# Patient Record
Sex: Male | Born: 1940
Health system: Southern US, Community
[De-identification: ages and names within clinical notes are randomized; demographics above are authoritative.]

## PROBLEM LIST (undated history)

## (undated) DIAGNOSIS — G2581 Restless legs syndrome: Secondary | ICD-10-CM

## (undated) DIAGNOSIS — Z86010 Personal history of colonic polyps: Secondary | ICD-10-CM

## (undated) DIAGNOSIS — E785 Hyperlipidemia, unspecified: Secondary | ICD-10-CM

## (undated) DIAGNOSIS — G4730 Sleep apnea, unspecified: Secondary | ICD-10-CM

## (undated) DIAGNOSIS — R739 Hyperglycemia, unspecified: Secondary | ICD-10-CM

## (undated) DIAGNOSIS — H269 Unspecified cataract: Secondary | ICD-10-CM

## (undated) DIAGNOSIS — D219 Benign neoplasm of connective and other soft tissue, unspecified: Secondary | ICD-10-CM

## (undated) DIAGNOSIS — K760 Fatty (change of) liver, not elsewhere classified: Secondary | ICD-10-CM

## (undated) DIAGNOSIS — I10 Essential (primary) hypertension: Secondary | ICD-10-CM

## (undated) DIAGNOSIS — G629 Polyneuropathy, unspecified: Secondary | ICD-10-CM

## (undated) DIAGNOSIS — J189 Pneumonia, unspecified organism: Secondary | ICD-10-CM

## (undated) DIAGNOSIS — G4733 Obstructive sleep apnea (adult) (pediatric): Secondary | ICD-10-CM

## (undated) DIAGNOSIS — K259 Gastric ulcer, unspecified as acute or chronic, without hemorrhage or perforation: Secondary | ICD-10-CM

## (undated) DIAGNOSIS — K5520 Angiodysplasia of colon without hemorrhage: Secondary | ICD-10-CM

## (undated) DIAGNOSIS — I2510 Atherosclerotic heart disease of native coronary artery without angina pectoris: Secondary | ICD-10-CM

## (undated) HISTORY — DX: Polyneuropathy, unspecified: G62.9

## (undated) HISTORY — DX: Sleep apnea, unspecified: G47.30

## (undated) HISTORY — DX: Gastric ulcer, unspecified as acute or chronic, without hemorrhage or perforation: K25.9

## (undated) HISTORY — DX: Hyperlipidemia, unspecified: E78.5

## (undated) HISTORY — DX: Benign neoplasm of connective and other soft tissue, unspecified: D21.9

## (undated) HISTORY — DX: Restless legs syndrome: G25.81

## (undated) HISTORY — DX: Unspecified cataract: H26.9

## (undated) HISTORY — DX: Personal history of colonic polyps: Z86.010

## (undated) HISTORY — DX: Hyperglycemia, unspecified: R73.9

## (undated) HISTORY — DX: Obstructive sleep apnea (adult) (pediatric): G47.33

## (undated) HISTORY — DX: Essential (primary) hypertension: I10

## (undated) HISTORY — DX: Fatty (change of) liver, not elsewhere classified: K76.0

## (undated) HISTORY — DX: Atherosclerotic heart disease of native coronary artery without angina pectoris: I25.10

## (undated) HISTORY — DX: Pneumonia, unspecified organism: J18.9

## (undated) HISTORY — DX: Angiodysplasia of colon without hemorrhage: K55.20

---

## 1995-04-29 HISTORY — PX: SKIN CANCER EXCISION: SHX779

## 2000-11-29 HISTORY — PX: ORIF ANKLE FRACTURE: SUR919

## 2003-10-27 ENCOUNTER — Encounter: Payer: Self-pay | Admitting: Family Medicine

## 2004-04-24 ENCOUNTER — Ambulatory Visit: Payer: Self-pay | Admitting: Family Medicine

## 2004-04-25 ENCOUNTER — Ambulatory Visit: Payer: Self-pay | Admitting: Family Medicine

## 2004-05-06 ENCOUNTER — Ambulatory Visit: Payer: Self-pay | Admitting: Family Medicine

## 2004-05-10 ENCOUNTER — Ambulatory Visit: Payer: Self-pay | Admitting: Pulmonary Disease

## 2004-10-24 ENCOUNTER — Ambulatory Visit: Payer: Self-pay | Admitting: Family Medicine

## 2005-02-26 ENCOUNTER — Ambulatory Visit: Payer: Self-pay | Admitting: Family Medicine

## 2005-03-28 ENCOUNTER — Encounter: Payer: Self-pay | Admitting: Family Medicine

## 2005-03-28 LAB — CONVERTED CEMR LAB: PSA: 0.7 ng/mL

## 2005-04-14 ENCOUNTER — Ambulatory Visit: Payer: Self-pay | Admitting: Family Medicine

## 2005-04-17 ENCOUNTER — Ambulatory Visit: Payer: Self-pay | Admitting: Family Medicine

## 2005-05-12 ENCOUNTER — Ambulatory Visit: Payer: Self-pay | Admitting: Family Medicine

## 2006-04-13 ENCOUNTER — Ambulatory Visit: Payer: Self-pay | Admitting: Family Medicine

## 2006-04-16 ENCOUNTER — Ambulatory Visit: Payer: Self-pay | Admitting: Family Medicine

## 2006-04-30 ENCOUNTER — Ambulatory Visit: Payer: Self-pay | Admitting: Family Medicine

## 2006-05-01 ENCOUNTER — Ambulatory Visit: Payer: Self-pay | Admitting: Family Medicine

## 2006-06-23 ENCOUNTER — Ambulatory Visit: Payer: Self-pay | Admitting: Family Medicine

## 2006-08-11 ENCOUNTER — Emergency Department: Payer: Self-pay | Admitting: Emergency Medicine

## 2006-08-21 ENCOUNTER — Emergency Department: Payer: Self-pay | Admitting: Emergency Medicine

## 2006-10-27 ENCOUNTER — Encounter: Payer: Self-pay | Admitting: Family Medicine

## 2006-10-27 DIAGNOSIS — G2581 Restless legs syndrome: Secondary | ICD-10-CM

## 2006-10-27 DIAGNOSIS — E785 Hyperlipidemia, unspecified: Secondary | ICD-10-CM

## 2006-10-27 DIAGNOSIS — G4730 Sleep apnea, unspecified: Secondary | ICD-10-CM | POA: Insufficient documentation

## 2006-10-27 DIAGNOSIS — G609 Hereditary and idiopathic neuropathy, unspecified: Secondary | ICD-10-CM | POA: Insufficient documentation

## 2006-11-02 ENCOUNTER — Ambulatory Visit: Payer: Self-pay | Admitting: Family Medicine

## 2006-11-02 DIAGNOSIS — I10 Essential (primary) hypertension: Secondary | ICD-10-CM

## 2007-04-27 ENCOUNTER — Ambulatory Visit: Payer: Self-pay | Admitting: Family Medicine

## 2007-04-27 LAB — CONVERTED CEMR LAB
ALT: 13 units/L (ref 0–53)
Albumin: 3.5 g/dL (ref 3.5–5.2)
Alkaline Phosphatase: 80 units/L (ref 39–117)
BUN: 11 mg/dL (ref 6–23)
Basophils Absolute: 0 10*3/uL (ref 0.0–0.1)
Calcium: 8.8 mg/dL (ref 8.4–10.5)
Cholesterol: 159 mg/dL (ref 0–200)
Eosinophils Absolute: 0.2 10*3/uL (ref 0.0–0.6)
GFR calc Af Amer: 78 mL/min
GFR calc non Af Amer: 65 mL/min
HDL: 23.7 mg/dL — ABNORMAL LOW (ref >39.0)
Hemoglobin: 13.9 g/dL (ref 13.0–17.0)
Lymphocytes Relative: 33.9 % (ref 12.0–46.0)
MCHC: 35.6 g/dL (ref 30.0–36.0)
Microalb Creat Ratio: 4.9 mg/g (ref 0.0–30.0)
Microalb, Ur: 0.9 mg/dL (ref 0.0–1.9)
Monocytes Absolute: 0.5 10*3/uL (ref 0.2–0.7)
Monocytes Relative: 8.9 % (ref 3.0–11.0)
Neutro Abs: 2.7 10*3/uL (ref 1.4–7.7)
PSA: 0.89 ng/mL (ref 0.10–4.00)
Platelets: 199 10*3/uL (ref 150–400)
Potassium: 3.9 meq/L (ref 3.5–5.1)
TSH: 3.42 microintl units/mL (ref 0.35–5.50)
Total Protein: 6 g/dL (ref 6.0–8.3)
Triglycerides: 112 mg/dL (ref 0–149)
VLDL: 22 mg/dL (ref 0–40)

## 2007-05-03 ENCOUNTER — Ambulatory Visit: Payer: Self-pay | Admitting: Family Medicine

## 2007-05-12 ENCOUNTER — Ambulatory Visit: Payer: Self-pay | Admitting: Family Medicine

## 2007-05-13 ENCOUNTER — Ambulatory Visit: Payer: Self-pay | Admitting: Family Medicine

## 2007-05-14 ENCOUNTER — Ambulatory Visit: Payer: Self-pay | Admitting: Family Medicine

## 2007-05-17 ENCOUNTER — Encounter (INDEPENDENT_AMBULATORY_CARE_PROVIDER_SITE_OTHER): Payer: Self-pay | Admitting: *Deleted

## 2007-05-17 ENCOUNTER — Ambulatory Visit: Payer: Self-pay | Admitting: Family Medicine

## 2007-06-02 ENCOUNTER — Ambulatory Visit: Payer: Self-pay | Admitting: Family Medicine

## 2008-01-25 ENCOUNTER — Encounter: Payer: Self-pay | Admitting: Family Medicine

## 2008-03-15 ENCOUNTER — Ambulatory Visit: Payer: Self-pay

## 2008-05-08 ENCOUNTER — Ambulatory Visit: Payer: Self-pay | Admitting: Family Medicine

## 2008-05-08 LAB — CONVERTED CEMR LAB
ALT: 22 units/L (ref 0–53)
Basophils Absolute: 0 10*3/uL (ref 0.0–0.1)
Basophils Relative: 0.4 % (ref 0.0–3.0)
CO2: 28 meq/L (ref 19–32)
Calcium: 9 mg/dL (ref 8.4–10.5)
Cholesterol: 166 mg/dL (ref 0–200)
Creatinine, Ser: 1.1 mg/dL (ref 0.4–1.5)
Creatinine,U: 142.2 mg/dL
Glucose, Bld: 104 mg/dL — ABNORMAL HIGH (ref 70–99)
HCT: 39.8 % (ref 39.0–52.0)
Hemoglobin: 13.9 g/dL (ref 13.0–17.0)
LDL Cholesterol: 115 mg/dL — ABNORMAL HIGH (ref 0–99)
Lymphocytes Relative: 31.4 % (ref 12.0–46.0)
MCHC: 35 g/dL (ref 30.0–36.0)
Microalb, Ur: 0.5 mg/dL (ref 0.0–1.9)
Monocytes Absolute: 0.4 10*3/uL (ref 0.1–1.0)
Neutro Abs: 3.1 10*3/uL (ref 1.4–7.7)
RBC: 4.4 M/uL (ref 4.22–5.81)
Total Bilirubin: 0.7 mg/dL (ref 0.3–1.2)
Total Protein: 6.2 g/dL (ref 6.0–8.3)

## 2008-05-11 ENCOUNTER — Ambulatory Visit: Payer: Self-pay | Admitting: Family Medicine

## 2008-05-29 ENCOUNTER — Ambulatory Visit: Payer: Self-pay | Admitting: Family Medicine

## 2008-05-29 ENCOUNTER — Encounter (INDEPENDENT_AMBULATORY_CARE_PROVIDER_SITE_OTHER): Payer: Self-pay | Admitting: *Deleted

## 2008-05-29 LAB — CONVERTED CEMR LAB: OCCULT 3: NEGATIVE

## 2009-01-09 ENCOUNTER — Encounter: Payer: Self-pay | Admitting: Family Medicine

## 2009-05-10 ENCOUNTER — Emergency Department: Payer: Self-pay | Admitting: Emergency Medicine

## 2009-05-10 ENCOUNTER — Telehealth: Payer: Self-pay | Admitting: Family Medicine

## 2009-05-16 ENCOUNTER — Ambulatory Visit: Payer: Self-pay | Admitting: Family Medicine

## 2009-05-17 LAB — CONVERTED CEMR LAB: Vit D, 25-Hydroxy: 41 ng/mL (ref 30–89)

## 2009-05-21 ENCOUNTER — Ambulatory Visit: Payer: Self-pay | Admitting: Family Medicine

## 2009-05-21 LAB — CONVERTED CEMR LAB
AST: 22 units/L (ref 0–37)
BUN: 19 mg/dL (ref 6–23)
Basophils Absolute: 0 10*3/uL (ref 0.0–0.1)
Calcium: 9.2 mg/dL (ref 8.4–10.5)
Cholesterol: 144 mg/dL (ref 0–200)
GFR calc non Af Amer: 64.09 mL/min (ref >60)
Glucose, Bld: 99 mg/dL (ref 70–99)
HCT: 41.1 % (ref 39.0–52.0)
HDL: 23.3 mg/dL — ABNORMAL LOW (ref >39.00)
Lymphocytes Relative: 36.9 % (ref 12.0–46.0)
Lymphs Abs: 1.9 10*3/uL (ref 0.7–4.0)
Monocytes Relative: 8.2 % (ref 3.0–12.0)
Platelets: 208 10*3/uL (ref 150.0–400.0)
RDW: 12.6 % (ref 11.5–14.6)
TSH: 2.64 microintl units/mL (ref 0.35–5.50)
Total Bilirubin: 0.8 mg/dL (ref 0.3–1.2)
Triglycerides: 177 mg/dL — ABNORMAL HIGH (ref 0.0–149.0)
VLDL: 35.4 mg/dL (ref 0.0–40.0)

## 2009-05-28 ENCOUNTER — Ambulatory Visit: Payer: Self-pay | Admitting: Family Medicine

## 2009-05-28 LAB — CONVERTED CEMR LAB
Creatinine,U: 80.1 mg/dL
Microalb, Ur: 0.2 mg/dL (ref 0.0–1.9)

## 2009-06-05 ENCOUNTER — Encounter (INDEPENDENT_AMBULATORY_CARE_PROVIDER_SITE_OTHER): Payer: Self-pay | Admitting: *Deleted

## 2009-06-05 ENCOUNTER — Ambulatory Visit: Payer: Self-pay | Admitting: Family Medicine

## 2009-06-05 LAB — CONVERTED CEMR LAB: OCCULT 2: NEGATIVE

## 2009-10-18 ENCOUNTER — Ambulatory Visit: Payer: Self-pay | Admitting: Family Medicine

## 2009-12-03 ENCOUNTER — Encounter (INDEPENDENT_AMBULATORY_CARE_PROVIDER_SITE_OTHER): Payer: Self-pay | Admitting: *Deleted

## 2010-05-23 ENCOUNTER — Ambulatory Visit
Admission: RE | Admit: 2010-05-23 | Discharge: 2010-05-23 | Payer: Self-pay | Source: Home / Self Care | Attending: Family Medicine | Admitting: Family Medicine

## 2010-05-23 ENCOUNTER — Other Ambulatory Visit: Payer: Self-pay | Admitting: Family Medicine

## 2010-05-23 LAB — LIPID PANEL
LDL Cholesterol: 103 mg/dL — ABNORMAL HIGH (ref 0–99)
Total CHOL/HDL Ratio: 6

## 2010-05-23 LAB — CBC WITH DIFFERENTIAL/PLATELET
Basophils Absolute: 0 10*3/uL (ref 0.0–0.1)
Hemoglobin: 14.3 g/dL (ref 13.0–17.0)
Lymphocytes Relative: 36.2 % (ref 12.0–46.0)
Monocytes Relative: 8.6 % (ref 3.0–12.0)
Neutro Abs: 2.6 10*3/uL (ref 1.4–7.7)
RBC: 4.53 Mil/uL (ref 4.22–5.81)
RDW: 13.2 % (ref 11.5–14.6)

## 2010-05-23 LAB — HEPATIC FUNCTION PANEL
ALT: 44 U/L (ref 0–53)
AST: 31 U/L (ref 0–37)
Bilirubin, Direct: 0.1 mg/dL (ref 0.0–0.3)
Total Bilirubin: 0.9 mg/dL (ref 0.3–1.2)

## 2010-05-23 LAB — PSA: PSA: 1.62 ng/mL (ref 0.10–4.00)

## 2010-05-23 LAB — RENAL FUNCTION PANEL
Albumin: 3.8 g/dL (ref 3.5–5.2)
BUN: 15 mg/dL (ref 6–23)
Calcium: 9.1 mg/dL (ref 8.4–10.5)
Creatinine, Ser: 1.1 mg/dL (ref 0.4–1.5)
Glucose, Bld: 92 mg/dL (ref 70–99)

## 2010-05-23 LAB — MICROALBUMIN / CREATININE URINE RATIO: Microalb Creat Ratio: 0.4 mg/g (ref 0.0–30.0)

## 2010-05-29 NOTE — Letter (Signed)
Summary: Nadara Eaton letter  [place] at [facility]  [address] [place], [place] [address]   Phone: [phone]  Fax: [phone]       12/03/2009 MRN: [id number]  NISSIM FLEISCHER [address] [place], [place]  [address]  Dear Mr. [facility] [place], and [place] announce the retirement of Arta Silence, M.D., from full-time practice at the [facility] office effective October 25, 2009 and his plans of returning part-time.  It is important to Dr. Hetty Ely and to our practice that you understand that [facility] has seven physicians in our office for your health care needs.  We will continue to offer the same exceptional care that you have today.    Dr. Hetty Ely has spoken to many of you about his plans for retirement and returning part-time in the fall.   We will continue to work with you through the transition to schedule appointments for you in the office and meet the high standards that [facility] is committed to.   Again, it is with great pleasure that we share the news that Dr. Hetty Ely will return to [facility] at [facility] in October of 2011 with a reduced schedule.    If you have any questions, or would like to request an appointment with one of our physicians, please call us at [phone] and press the option for Scheduling an appointment.  We take pleasure in providing you with excellent patient care and look forward to seeing you at your next office visit.  Our [facility] Physicians are:  Tillman Abide, M.D. Laurita Quint, M.D. Roxy Manns, M.D. Kerby Nora, M.D. Hannah Beat, M.D. Ruthe Mannan, M.D. We proudly welcomed Raechel Ache, M.D. and Eustaquio Boyden, M.D. to the practice in July/August 2011.  Sincerely,  [place] [facility]

## 2010-05-29 NOTE — Assessment & Plan Note (Signed)
Summary: COLD CONGESTION/RBH   Vital Signs:  Patient profile:   70 year old male Weight:      213 pounds Temp:     98.9 degrees F oral Pulse rate:   76 / minute Pulse rhythm:   regular BP sitting:   124 / 82  (right arm) Cuff size:   regular  Vitals Entered By: Lowella Petties CMA (October 18, 2009 12:33 PM) CC: Cough x 2 weeks   History of Present Illness: 70 yo walks in for progressive cough x 2 weeks.  Cough is dry, nagging. Has had some wheezing, no SOB. Felt feverish last night. Not taking anything OTC.  Did have a runny nose and sneezing when this started but that has all since resolved. No sore throat, nausea, vomiting or rashes.  Current Medications (verified): 1)  Gabapentin 300 Mg Caps (Gabapentin) .... Take 1 Capsule By Mouth At Bedtime 2)  Lisinopril 5 Mg  Tabs (Lisinopril) .... Take One By Mouth Daily 3)  Norvasc 5 Mg  Tabs (Amlodipine Besylate) .Marland Kitchen.. 1 Daily By Mouth 4)  Azithromycin 250 Mg  Tabs (Azithromycin) .... 2 By  Mouth Today and Then 1 Daily For 4 Days 5)  Cheratussin Ac 100-10 Mg/69ml Syrp (Guaifenesin-Codeine) .Marland Kitchen.. 1 Tsp By Mouth At Bedtime As Needed Cough  Allergies (verified): No Known Drug Allergies  Past History:  Past Surgical History: Last updated: 05/11/2008 Skin Ca Excision L Ant Neck 1997 Sleep study (11/29/00) mild-mod sleep apnea 10/02 Fractured Ankle ORIF (Dr Katrinka Blazing)  08/2002  Family History: Last updated: 19-Jun-2009 Father: Died at 31 of cancer of the liver Mother:Died at age of 14 with breast cancer Half-brother A 25  HTN Half sister dec unk reasons P aunt CABG Liver cancer- father Breast cancer- mother GM died with cancer Aunt died with cancer ETOH- father  Social History: Last updated: 10/27/2006 Marital Status: Married Children: 2 children, out of the home 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 ENT:  Denies earache, sinus pressure, and sore throat. Resp:  Complains of cough; denies shortness of breath, sputum productive, and wheezing.  Physical Exam  General:  Well-developed,well-nourished,in no acute distress; alert,appropriate and cooperative throughout examination, mildly obese. Nose:  External nasal examination shows no deformity or inflammation. Nasal mucosa are pink and moist without lesions or exudates. Mouth:  Oral mucosa and oropharynx without lesions or exudates.  Teeth in good repair. Lungs:  Normal respiratory effort, chest expands symmetrically. Lungs are clear to auscultation, no crackles or wheezes. Heart:  Normal rate and regular rhythm. S1 and S2 normal without gallop, murmur, click, rub or other extra sounds. Extremities:  No clubbing, cyanosis, edema, or deformity noted with normal full range of motion of all joints.   Psych:  Cognition and judgment appear intact. Alert and cooperative with normal attention span and concentration. No apparent delusions, illusions, hallucinations   Impression & Recommendations:  Problem # 1:  BRONCHITIS, ACUTE (ICD-466.0) Assessment New Will treat with Zpack and cheratussin as needed cough. See pt instructions for details. His updated medication list for this problem includes:    Azithromycin 250 Mg Tabs (Azithromycin) .Marland Kitchen... 2 by  mouth today and then 1 daily for 4 days    Cheratussin Ac 100-10  Mg/46ml Syrp (Guaifenesin-codeine) .Marland Kitchen... 1 tsp by mouth at bedtime as needed cough  Complete Medication List: 1)  Gabapentin 300 Mg Caps (Gabapentin) .... Take 1 capsule by mouth at bedtime 2)  Lisinopril 5 Mg Tabs (Lisinopril) .... Take one by mouth daily 3)  Norvasc 5 Mg Tabs (Amlodipine besylate) .Marland Kitchen.. 1 daily by mouth 4)  Azithromycin  250 Mg Tabs (Azithromycin) .... 2 by  mouth today and then 1 daily for 4 days 5)  Cheratussin Ac 100-10 Mg/20ml Syrp (Guaifenesin-codeine) .Marland Kitchen.. 1 tsp by mouth at bedtime as needed cough  Patient Instructions: 1)  Take Zpack as directed. 2)  Cheratussin prn cough at night (please do not drive while taking it). Call if no improvement in 5-7 days, sooner if increasing cough, fever, or new symptoms ( shortness of breath, chest pain) .  Prescriptions: CHERATUSSIN AC 100-10 MG/5ML SYRP (GUAIFENESIN-CODEINE) 1 tsp by mouth at bedtime as needed cough  #4 ounces x 0   Entered and Authorized by:   Ruthe Mannan MD   Signed by:   Ruthe Mannan MD on 10/18/2009   Method used:   Print then Give to Patient   RxID:   [id number] AZITHROMYCIN 250 MG  TABS (AZITHROMYCIN) 2 by  mouth today and then 1 daily for 4 days  #6 x 0   Entered and Authorized by:   Ruthe Mannan MD   Signed by:   Ruthe Mannan MD on 10/18/2009   Method used:   Print then Give to Patient   RxID:   [id number]
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
## 2010-05-29 NOTE — Letter (Signed)
Summary: Results Follow up Letter  [place] at [facility]  [address] [place], [place] [address]   Phone: [phone]  Fax: [phone]    06/05/2009 MRN: [id number]  SAI ZINN [address] [place], [place]  [address]  Dear Mr. Esch,  The following are the results of your recent test(s):  Test         Result    Pap Smear:        Normal _____  Not Normal _____ Comments: ______________________________________________________ Cholesterol: LDL(Bad cholesterol):         Your goal is less than:         HDL (Good cholesterol):       Your goal is more than: Comments:  ______________________________________________________ Mammogram:        Normal _____  Not Normal _____ Comments:  ___________________________________________________________________ Hemoccult:        Normal __X___  Not normal _______ Comments:  Please repeat in one year.  _____________________________________________________________________ Other Tests:    We routinely do not discuss normal results over the telephone.  If you desire a copy of the results, or you have any questions about this information we can discuss them at your next office visit.   Sincerely,     Laurita Quint, MD

## 2010-05-29 NOTE — Progress Notes (Signed)
Summary: Patient fell  Phone Note Call from Patient Call back at [phone]   Caller: Spouse Call For: [name] MD Summary of Call: Patient fell 2 days ago on the ice and is out of town and will not return until late tonight. Patient's wife states that he is real sore and bruised in his rib area. Patient told wife that he has had a cold and coughing and is trying to cough something up and can not get it up and he is nervous about it at this point. Wife stated that he is not having any difficulty breathing. Advised patient's wife that he should seek medical attention where he is at now. Wife stated that she will advise patient that he needs to go to an urgent care now. Initial call taken by: Sydell Axon LPN,  May 10, 2009 9:52 AM  Follow-up for Phone Call        That is reasonable.   Follow-up by: Hannah Beat MD,  May 10, 2009 10:01 AM

## 2010-06-12 ENCOUNTER — Encounter: Payer: Self-pay | Admitting: Family Medicine

## 2010-06-12 ENCOUNTER — Encounter (INDEPENDENT_AMBULATORY_CARE_PROVIDER_SITE_OTHER): Payer: Commercial Managed Care - PPO | Admitting: Family Medicine

## 2010-06-12 DIAGNOSIS — Z0000 Encounter for general adult medical examination without abnormal findings: Secondary | ICD-10-CM

## 2010-06-12 DIAGNOSIS — Z23 Encounter for immunization: Secondary | ICD-10-CM

## 2010-06-19 NOTE — Letter (Signed)
Summary: Nature conservation officer [employer] Wellness Visit Questionnaire   [employer] Annual Wellness Visit Questionnaire   Imported By: Beau Fanny 06/12/2010 16:46:11  _____________________________________________________________________  External Attachment:    Type:   Image     Comment:   External Document

## 2010-06-19 NOTE — Letter (Signed)
Summary: [place] [facility]: Immunoassay Fecal Occult Blood (iFOB) Order Form  [place] at [facility]  [address] [place], [place] [address]   Phone: [phone]  Fax: [phone]      [place] [facility]: Immunoassay Fecal Occult Blood (iFOB) Order Form   June 12, 2010 MRN: [id number]   Justin Ali 08-06-1940   Physicican Name:____Dr Hetty Ely   _____________________  Diagnosis Code:_____v70.0_____________________      [name] MD

## 2010-06-19 NOTE — Assessment & Plan Note (Signed)
Summary: CPX DR Hetty Ely PT/ [facility]   Vital Signs:  Patient profile:   70 year old male Weight:      212 pounds BMI:     33.83 Temp:     98.6 degrees F oral Pulse rate:   68 / minute Pulse rhythm:   regular BP sitting:   104 / 64  (left arm) Cuff size:   large  Vitals Entered By: Sydell Axon LPN (07/05/10 1:49 PM) CC: 30 Minute checkup   History of Present Illness: Pt here for COmp Exam, has no complaints and feels well.  Preventive Screening-Counseling & Management  Alcohol-Tobacco     Alcohol drinks/day: <1     Alcohol type: bourbon on wknds     Smoking Status: quit     Packs/Day: cigars     Year Quit: 6/05     Pack years: 2 cigars....quit!!     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 Take 1 Capsule By Mouth At Bedtime 2)  Lisinopril 5 Mg  Tabs (Lisinopril) .... Take One By Mouth Daily 3)  Norvasc 5 Mg  Tabs (Amlodipine Besylate) .Marland Kitchen.. 1 Daily By Mouth 4)  Cheratussin Ac 100-10 Mg/54ml Syrp (Guaifenesin-Codeine) .Marland Kitchen.. 1 Tsp By Mouth At Bedtime As Needed Cough  Current Medications (verified): 1)  Gabapentin 300 Mg Caps (Gabapentin) .... Take 1 Capsule By Mouth At Bedtime 2)  Lisinopril 5 Mg  Tabs (Lisinopril) .... Take  One By Mouth Daily 3)  Norvasc 5 Mg  Tabs (Amlodipine Besylate) .Marland Kitchen.. 1 Daily By Mouth  Allergies: No Known Drug Allergies  Past History:  Past Surgical History: Last updated: 05/11/2008 Skin Ca Excision L Ant Neck 1997 Sleep study (11/29/00) mild-mod sleep apnea 10/02 Fractured Ankle ORIF (Dr Katrinka Blazing)  08/2002  Family History: Last updated: July 05, 2010 Father: Died at 56 of cancer of the liver Mother:Died at age of 40 with breast cancer Half-brother A 64  HTN Half sister dec unk reasons P aunt CABG Liver cancer- father Breast cancer- mother GM died with cancer Aunt died with cancer ETOH- father  Social History: Last updated: 10/27/2006 Marital Status: Married Children: 2 children, out of the home 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 Eyes:  Denies blurring, discharge, and  eye pain; has early cataracts. Seen yearly with new glasses in Nov.. ENT:  Complains of ringing in ears; denies decreased hearing and earache; occas in left, much better than previously.. CV:  Denies chest pain or discomfort, fainting, fatigue, palpitations, shortness of breath with exertion, swelling of feet, and swelling of hands; occas swelling og ankle with pin in it.Marland Kitchen Resp:  Denies cough, shortness of breath, and wheezing. GI:  Denies abdominal pain, bloody stools, change in bowel habits,  constipation, dark tarry stools, diarrhea, indigestion, loss of appetite, nausea, vomiting, vomiting blood, and yellowish skin color. GU:  Denies discharge, dysuria, nocturia, and urinary frequency. MS:  Denies joint pain, low back pain, muscle aches, cramps, and stiffness. Derm:  Denies dryness, itching, and rash. Neuro:  Denies memory loss, numbness, poor balance, tingling, and tremors.  Physical Exam  General:  Well-developed,well-nourished,in no acute distress; alert,appropriate and cooperative throughout examination, mildly obese. Head:  Normocephalic and atraumatic without obvious abnormalities. No apparent alopecia or balding. Sinuses NT. Eyes:  Conjunctiva clear bilaterally.  Ears:  External ear exam shows no significant lesions or deformities.  Otoscopic examination reveals clear canals, tympanic membranes are intact bilaterally without bulging, retraction, inflammation or discharge. Hearing is grossly normal bilaterally. Nose:  External nasal examination shows no deformity or inflammation. Nasal mucosa are pink and moist without lesions or exudates. Mouth:  Oral mucosa and oropharynx without lesions or exudates.  Teeth in good repair. Neck:  No deformities, masses, or tenderness noted. Chest Wall:  No deformities, masses, tenderness or gynecomastia noted. Breasts:  No masses or gynecomastia noted Lungs:  Normal respiratory effort, chest expands symmetrically. Lungs are clear to auscultation, no crackles or wheezes. Heart:  Normal rate and regular rhythm. S1 and S2 normal without gallop, murmur, click, rub or other extra sounds. Abdomen:  Bowel sounds positive,abdomen soft and non-tender without masses, organomegaly or hernias noted. Rectal:  No external abnormalities noted. Normal sphincter tone. No rectal masses or tenderness. G neg. Genitalia:  Testes bilaterally descended without nodularity, tenderness or masses. No scrotal masses or lesions. No penis lesions or urethral  discharge. Prostate:  Prostate gland firm and smooth, no enlargement, nodularity, tenderness, mass, asymmetry or induration. 20gms. Msk:  No deformity or scoliosis noted of thoracic or lumbar spine.   Pulses:  R and L carotid,radial,femoral,dorsalis pedis and posterior tibial pulses are full and equal bilaterally Extremities:  No clubbing, cyanosis, edema, or deformity noted with normal full range of motion of all joints.   Neurologic:  No cranial nerve deficits noted. Station and gait are normal. Sensory, motor and coordinative functions appear intact. Skin:  Intact without suspicious lesions or rashes Cervical Nodes:  No lymphadenopathy noted Inguinal Nodes:  No significant adenopathy Psych:  Cognition and judgment appear intact. Alert and cooperative with normal attention span and concentration. No apparent delusions, illusions, hallucinations   Impression & Recommendations:  Problem # 1:  HEALTH MAINTENANCE EXAM (ICD-V70.0)  Reviewed preventive care protocols, scheduled due services, and updated immunizations. Pneumovax today. Stool Imunoassay to be done. Look into Zostavax. I have personally reviewed the Medicare Annual Wellness questionnaire and have noted 1.   The patient's medical and social history 2.   Their use of alcohol, tobacco or illicit drugs 3.   Their current medications and supplements 4.   The patient's functional ability including ADL's, fall risks, home safety risks and hearing or visual             impairment. 5.   Diet and physical activities 6.  Evidence for depression or mood disorders   Problem # 2:  SPECIAL SCREENING MALIGNANT NEOPLASM OF PROSTATE (ICD-V76.44) Assessment: Unchanged Stable PSA and exam.  Problem # 3:  HYPERGLYCEMIA, 105 (ICD-790.6) Assessment: Improved Euglycemic today.  Problem # 4:  HYPERTENSION, BENIGN ESSENTIAL (ICD-401.1) Assessment: Unchanged Stable. His updated medication list for this problem includes:    Lisinopril 5 Mg  Tabs (Lisinopril) .Marland Kitchen... Take one by mouth daily    Norvasc 5 Mg Tabs (Amlodipine besylate) .Marland Kitchen... 1 daily by mouth  BP today: 104/64 Prior BP: 124/82 (10/18/2009)  Labs Reviewed: K+: 4.3 (05/23/2010) Creat: : 1.1 (05/23/2010)   Chol: 158 (05/23/2010)   HDL: 24.90 (05/23/2010)   LDL: 103 (05/23/2010)   TG: 151.0 (05/23/2010)  Problem # 5:  SLEEP APNEA (ICD-780.57) Assessment: Unchanged Stable.  Problem # 6:  PERIPHERAL NEUROPATHY (ICD-356.9) Assessment: Unchanged Stable with Gabapentin at night.  Problem # 7:  HYPERCHOLESTEROLEMIA/TRIG, DECREASED HDL (ICD-272.0) Assessment: Unchanged Stable and about acceptable. Exercise would help. Labs Reviewed: SGOT: 31 (05/23/2010)   SGPT: 44 (05/23/2010)   HDL:24.90 (05/23/2010), 23.30 (05/16/2009)  LDL:103 (05/23/2010), 85 (16/01/9603)  Chol:158 (05/23/2010), 144 (05/16/2009)  Trig:151.0 (05/23/2010), 177.0 (05/16/2009)  Complete Medication List: 1)  Gabapentin 300 Mg Caps (Gabapentin) .... Take 1 capsule by mouth at bedtime 2)  Lisinopril 5 Mg Tabs (Lisinopril) .... Take one by mouth daily 3)  Norvasc 5 Mg Tabs (Amlodipine besylate) .Marland Kitchen.. 1 daily by mouth  Patient Instructions: 1)  Investigate a Zostavax. Call insurance.  2)  Pneumovax today. 3)  RTC one year, sooner as needed. Prescriptions: NORVASC 5 MG  TABS (AMLODIPINE BESYLATE) 1 daily by mouth  #30 x 12   Entered and Authorized by:   [name] MD   Signed by:   [name] MD on 06/12/2010   Method used:   Electronically to        [facility]* (retail)       [address] [place], [place]  [address]       Ph: [phone]       Fax: [phone]   RxID:   [id number] LISINOPRIL 5 MG  TABS (LISINOPRIL) Take one by mouth daily  #30 x 12   Entered and Authorized by:   [name] MD   Signed by:   [name] MD on 06/12/2010   Method used:   Electronically to        [facility]* (retail)       [address] [place],  [place]  [address]       Ph: [phone]       Fax: [phone]   RxID:   [id number] GABAPENTIN 300 MG CAPS (GABAPENTIN) Take 1 capsule by mouth at bedtime  #30 x 12   Entered and Authorized by:   [name] MD   Signed by:   [name] MD on 06/12/2010   Method used:   Electronically to        [facility]* (retail)       [address] [place], [place]  [address]       Ph: [phone]       Fax: [phone]   RxID:   [id number]    Orders Added: 1)  Est. Patient 65& > [37106]    Current Allergies (reviewed today): No known allergies    Appended Document:  CPX DR Hetty Ely PT/ [facility]   Pneumovax Vaccine    Vaccine Type: Pneumovax (Medicare)    Site: left deltoid    Mfr: Merck    Dose: 0.5 ml    Route: IM    Given by: Sydell Axon LPN    Exp. Date: 09/20/2011    Lot #: [id number]    VIS given: 04/02/09 version given June 12, 2010.

## 2010-06-20 ENCOUNTER — Other Ambulatory Visit: Payer: Self-pay | Admitting: Family Medicine

## 2010-06-20 ENCOUNTER — Encounter (INDEPENDENT_AMBULATORY_CARE_PROVIDER_SITE_OTHER): Payer: Self-pay | Admitting: *Deleted

## 2010-06-20 ENCOUNTER — Other Ambulatory Visit: Payer: Commercial Managed Care - PPO

## 2010-06-20 DIAGNOSIS — Z0000 Encounter for general adult medical examination without abnormal findings: Secondary | ICD-10-CM

## 2010-06-20 LAB — FECAL OCCULT BLOOD, IMMUNOCHEMICAL: Fecal Occult Bld: NEGATIVE

## 2010-06-25 NOTE — Letter (Signed)
Summary: Results Follow up Letter  [place] [facility]  [address] [place], [place] [address]   Phone: [phone]  Fax: [phone]    06/20/2010 MRN: [id number]    DEWARREN LEDBETTER [address] [place], [place]  [address]  [place]    Dear Mr. Esperanza,  The following are the results of your recent test(s):  Test         Result    Pap Smear:        Normal _____  Not Normal _____ Comments: ______________________________________________________ Cholesterol: LDL(Bad cholesterol):         Your goal is less than:         HDL (Good cholesterol):       Your goal is more than: Comments:  ______________________________________________________ Mammogram:        Normal _____  Not Normal _____ Comments:  ___________________________________________________________________ Hemoccult:        Normal __X___  Not normal _______ Comments:  Please repeat in one year.  _____________________________________________________________________ Other Tests:    We routinely do not discuss normal results over the telephone.  If you desire a copy of the results, or you have any questions about this information we can discuss them at your next office visit.   Sincerely,     Laurita Quint, MD

## 2010-10-21 ENCOUNTER — Other Ambulatory Visit: Payer: Self-pay | Admitting: *Deleted

## 2010-10-21 MED ORDER — GABAPENTIN 300 MG PO CAPS: 300.0000 mg | ORAL_CAPSULE | Freq: Every day | ORAL | Status: DC

## 2010-10-21 NOTE — Telephone Encounter (Signed)
Phone into pharmacy of pt's request.

## 2010-10-22 NOTE — Telephone Encounter (Signed)
Rx called to pharmacy

## 2010-12-31 ENCOUNTER — Telehealth: Payer: Self-pay | Admitting: Family Medicine

## 2011-01-01 NOTE — Telephone Encounter (Signed)
cancelled

## 2011-05-16 ENCOUNTER — Other Ambulatory Visit: Payer: Self-pay | Admitting: Family Medicine

## 2011-05-16 DIAGNOSIS — I10 Essential (primary) hypertension: Secondary | ICD-10-CM

## 2011-05-16 DIAGNOSIS — E7800 Pure hypercholesterolemia, unspecified: Secondary | ICD-10-CM

## 2011-05-16 DIAGNOSIS — Z125 Encounter for screening for malignant neoplasm of prostate: Secondary | ICD-10-CM

## 2011-05-20 ENCOUNTER — Other Ambulatory Visit (INDEPENDENT_AMBULATORY_CARE_PROVIDER_SITE_OTHER): Payer: Commercial Managed Care - PPO

## 2011-05-20 DIAGNOSIS — I10 Essential (primary) hypertension: Secondary | ICD-10-CM

## 2011-05-20 DIAGNOSIS — Z125 Encounter for screening for malignant neoplasm of prostate: Secondary | ICD-10-CM

## 2011-05-20 DIAGNOSIS — E7800 Pure hypercholesterolemia, unspecified: Secondary | ICD-10-CM

## 2011-05-20 LAB — COMPREHENSIVE METABOLIC PANEL
AST: 45 U/L — ABNORMAL HIGH (ref 0–37)
Albumin: 3.8 g/dL (ref 3.5–5.2)
Alkaline Phosphatase: 88 U/L (ref 39–117)
Glucose, Bld: 102 mg/dL — ABNORMAL HIGH (ref 70–99)
Potassium: 4.3 mEq/L (ref 3.5–5.1)
Sodium: 142 mEq/L (ref 135–145)
Total Protein: 6.2 g/dL (ref 6.0–8.3)

## 2011-05-20 LAB — LIPID PANEL
LDL Cholesterol: 118 mg/dL — ABNORMAL HIGH (ref 0–99)
VLDL: 25.6 mg/dL (ref 0.0–40.0)

## 2011-05-27 ENCOUNTER — Encounter: Payer: Commercial Managed Care - PPO | Admitting: Family Medicine

## 2011-06-02 ENCOUNTER — Ambulatory Visit (INDEPENDENT_AMBULATORY_CARE_PROVIDER_SITE_OTHER): Payer: Commercial Managed Care - PPO | Admitting: Family Medicine

## 2011-06-02 ENCOUNTER — Encounter: Payer: Self-pay | Admitting: Family Medicine

## 2011-06-02 VITALS — BP 130/80 | HR 84 | Temp 98.6°F | Ht 66.0 in | Wt 215.8 lb

## 2011-06-02 DIAGNOSIS — M25569 Pain in unspecified knee: Secondary | ICD-10-CM

## 2011-06-02 DIAGNOSIS — M25562 Pain in left knee: Secondary | ICD-10-CM

## 2011-06-02 DIAGNOSIS — Z1211 Encounter for screening for malignant neoplasm of colon: Secondary | ICD-10-CM

## 2011-06-02 DIAGNOSIS — Z0000 Encounter for general adult medical examination without abnormal findings: Secondary | ICD-10-CM | POA: Insufficient documentation

## 2011-06-02 DIAGNOSIS — K760 Fatty (change of) liver, not elsewhere classified: Secondary | ICD-10-CM | POA: Insufficient documentation

## 2011-06-02 NOTE — Patient Instructions (Addendum)
Return in 3 months for recheck blood work (liver).  If staying elevated, we will check ultrasound.  In meantime avoid tylenol and back off some on EtOH. For knee - I think you have patellar tendon inflammation.  Take aleve daily to twice daily for next 5 days with food then as needed for pain.  May also ice knee.  Stretching exercises provided - also do lateral straight leg raises. Let me know if knee not improving. iFOB (stool kit) today Call us with questions, return in 1 year for repeat physical, in 3 months for repeat blood work

## 2011-06-02 NOTE — Assessment & Plan Note (Addendum)
New.  Denies increased tylenol use or excessive EtOH use (only 3-4 drinks on weekend with dinner - bourbon). rec rtc 3 mo for recheck, back off EtOH. will check ferritin and hep panel then. If staying elevated, check Abd [place].

## 2011-06-02 NOTE — Assessment & Plan Note (Addendum)
Preventative protocols reviewed and updated. Sent home with iFOB. PSA, DRE reassuring. Discussed healthy living, reviewed blood work in detail.

## 2011-06-02 NOTE — Assessment & Plan Note (Signed)
Anticipate PFPS, provided with [facility] pt advisor handout on this as well as rec use aleve and ice. If not improving, let me know.

## 2011-06-02 NOTE — Progress Notes (Signed)
Subjective:    Patient ID: Justin Ali, male    DOB: February 04, 1941, 71 y.o.   MRN: [id number]  HPI CC: CPE  Prior saw Dr. Hetty Ely.  Presents to transfer care and for CPE.  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  No pain with walking or anything else.  No pain with stairs, hills, etc.  Started 3-4 mo ago.  Worse with bending knee.  Denies inciting injury.  Would like medicine for pain.  Has not tried OTC meds but has aleve at home.  Preventative: Last blood work, physical Colonoscopy: never had.  Always normal stool kits.  Wants to continue this. Prostate - gets checked yearly.  No nocturia, no weakness of stream. Flu shot - 01/2011 Td - 2012 at [facility] Pneumonia completed 2012 zostavax - insurance won't cover so not interested.  No falls in last year. No anhedonia.  Denies depressed.  Medications and allergies reviewed and updated in chart.  Past histories reviewed and updated if relevant as below. Patient Active Problem List  Diagnoses  . HYPERCHOLESTEROLEMIA/TRIG, DECREASED HDL  . RESTLESS LEG SYNDROME  . PERIPHERAL NEUROPATHY  . HYPERTENSION, BENIGN ESSENTIAL  . SLEEP APNEA   Past Medical History  Diagnosis Date  . Hyperglycemia   . HTN (hypertension)   . Other benign neoplasm of connective and other soft tissue of unspecified site     Neurofibroma of lateral periorbital area  . Peripheral neuropathy   . Restless legs syndrome (RLS)   . Unspecified sleep apnea   . HLD (hyperlipidemia)    Past Surgical History  Procedure Date  . Skin cancer excision 1997    left anterior neck  . Orif ankle fracture 11/29/00    Dr. Katrinka Blazing   History  Substance Use Topics  . Smoking status: Former Smoker    Types: Cigars  . Smokeless tobacco: Not on file  . Alcohol Use: Yes     Weekends   Family History  Problem Relation Age of Onset  . Liver cancer Father   . Breast cancer Mother   . Hypertension Brother     Half-brother  .  Coronary artery disease Paternal Aunt     CABG  . Cancer      GM; Aunt  . Alcohol abuse Father    No Known Allergies Current Outpatient Prescriptions on File Prior to Visit  Medication Sig Dispense Refill  . gabapentin (NEURONTIN) 300 MG capsule Take 1 capsule (300 mg total) by mouth at bedtime.  30 capsule  12     Review of Systems  Constitutional: Negative for fever, chills, activity change, appetite change, fatigue and unexpected weight change.  HENT: Negative for hearing loss and neck pain.   Eyes: Negative for visual disturbance.  Respiratory: Negative for cough, chest tightness, shortness of breath and wheezing.   Cardiovascular: Negative for chest pain, palpitations and leg swelling.  Gastrointestinal: Negative for nausea, vomiting, abdominal pain, diarrhea, constipation, blood in stool and abdominal distention.  Genitourinary: Negative for hematuria and difficulty urinating.  Musculoskeletal: Negative for myalgias and arthralgias.  Skin: Negative for rash.  Neurological: Negative for dizziness, seizures, syncope and headaches.  Hematological: Does not bruise/bleed easily.  Psychiatric/Behavioral: Negative for dysphoric mood. The patient is not nervous/anxious.        Objective:   Physical Exam  Nursing note and vitals reviewed. Constitutional: He is oriented to person, place, and time. He appears well-developed and well-nourished. No distress.  HENT:  Head:  Normocephalic and atraumatic.  Right Ear: External ear normal.  Left Ear: External ear normal.  Nose: Nose normal.  Mouth/Throat: Oropharynx is clear and moist. No oropharyngeal exudate.  Eyes: Conjunctivae and EOM are normal. Pupils are equal, round, and reactive to light. No scleral icterus.  Neck: Normal range of motion. Neck supple. No thyromegaly present.  Cardiovascular: Normal rate, regular rhythm, normal heart sounds and intact distal pulses.   No murmur heard. Pulses:      Radial pulses are 2+ on the  right side, and 2+ on the left side.  Pulmonary/Chest: Effort normal and breath sounds normal. No respiratory distress. He has no wheezes. He has no rales.  Abdominal: Soft. Bowel sounds are normal. He exhibits no distension and no mass. There is no tenderness. There is no rebound and no guarding.  Genitourinary: Rectum normal and prostate normal. Rectal exam shows no external hemorrhoid, no internal hemorrhoid, no fissure, no mass, no tenderness and anal tone normal. Guaiac negative stool. Prostate is not enlarged and not tender.  Musculoskeletal: Normal range of motion. He exhibits edema (tr pedal edema).       Right knee: Normal.       Left knee: He exhibits normal range of motion, no swelling and no effusion. tenderness found.       FROM flexion/extension. Neg Mcmurray's bilaterally Neg drawer sign bilaterally + PFgrind on left side.  Tender to palpation patellar tendon and superior medial edge of patella No fullness popliteal region  Lymphadenopathy:    He has no cervical adenopathy.  Neurological: He is alert and oriented to person, place, and time.       CN grossly intact, station and gait intact  Skin: Skin is warm and dry. No rash noted.  Psychiatric: He has a normal mood and affect. His behavior is normal. Judgment and thought content normal.      Assessment & Plan:

## 2011-08-27 DIAGNOSIS — K760 Fatty (change of) liver, not elsewhere classified: Secondary | ICD-10-CM

## 2011-08-27 HISTORY — DX: Fatty (change of) liver, not elsewhere classified: K76.0

## 2011-08-28 ENCOUNTER — Other Ambulatory Visit: Payer: Self-pay | Admitting: Family Medicine

## 2011-09-01 ENCOUNTER — Other Ambulatory Visit (INDEPENDENT_AMBULATORY_CARE_PROVIDER_SITE_OTHER): Payer: Commercial Managed Care - PPO

## 2011-09-01 LAB — HEPATIC FUNCTION PANEL
ALT: 80 U/L — ABNORMAL HIGH (ref 0–53)
Total Protein: 6.8 g/dL (ref 6.0–8.3)

## 2011-09-02 ENCOUNTER — Other Ambulatory Visit: Payer: Self-pay | Admitting: Family Medicine

## 2011-09-02 LAB — HEPATITIS PANEL, ACUTE
Hep B C IgM: NEGATIVE
Hepatitis B Surface Ag: NEGATIVE

## 2011-09-02 MED ORDER — OMEGA-3 FATTY ACIDS 1000 MG PO CAPS: 1.0000 g | ORAL_CAPSULE | Freq: Every day | ORAL | Status: DC

## 2011-09-16 ENCOUNTER — Encounter: Payer: Self-pay | Admitting: Family Medicine

## 2011-09-16 ENCOUNTER — Ambulatory Visit: Payer: Self-pay | Admitting: Family Medicine

## 2011-09-17 ENCOUNTER — Encounter: Payer: Self-pay | Admitting: Family Medicine

## 2011-11-06 ENCOUNTER — Other Ambulatory Visit: Payer: Self-pay | Admitting: *Deleted

## 2011-11-06 MED ORDER — AMLODIPINE BESYLATE 5 MG PO TABS: 5.0000 mg | ORAL_TABLET | Freq: Every day | ORAL | Status: DC

## 2011-11-06 NOTE — Telephone Encounter (Signed)
Received failed escript for amlodipine.  Please call in to ensure received. Thanks.

## 2011-11-06 NOTE — Addendum Note (Signed)
Addended by: Eustaquio Boyden on: 11/06/2011 01:29 PM   Modules accepted: Orders

## 2011-11-07 ENCOUNTER — Other Ambulatory Visit: Payer: Self-pay | Admitting: *Deleted

## 2011-11-07 MED ORDER — LISINOPRIL 5 MG PO TABS: 5.0000 mg | ORAL_TABLET | Freq: Every day | ORAL | Status: DC

## 2011-11-07 NOTE — Telephone Encounter (Signed)
Verified with pharmacy .  

## 2011-12-08 ENCOUNTER — Other Ambulatory Visit: Payer: Self-pay | Admitting: *Deleted

## 2011-12-08 MED ORDER — AMLODIPINE BESYLATE 5 MG PO TABS: 5.0000 mg | ORAL_TABLET | Freq: Every day | ORAL | Status: DC

## 2012-01-16 ENCOUNTER — Telehealth: Payer: Self-pay | Admitting: *Deleted

## 2012-01-16 MED ORDER — GABAPENTIN 300 MG PO CAPS: 300.0000 mg | ORAL_CAPSULE | Freq: Every day | ORAL | Status: DC

## 2012-01-16 NOTE — Telephone Encounter (Signed)
Sent in

## 2012-01-16 NOTE — Telephone Encounter (Signed)
Ok to refill Gabapentin 300 mg 1 PO QHS? Not on current med list.

## 2012-03-10 ENCOUNTER — Other Ambulatory Visit: Payer: Self-pay | Admitting: Family Medicine

## 2012-03-17 ENCOUNTER — Other Ambulatory Visit (INDEPENDENT_AMBULATORY_CARE_PROVIDER_SITE_OTHER): Payer: Commercial Managed Care - PPO

## 2012-03-17 LAB — COMPREHENSIVE METABOLIC PANEL
AST: 36 U/L (ref 0–37)
Alkaline Phosphatase: 99 U/L (ref 39–117)
BUN: 13 mg/dL (ref 6–23)
Creatinine, Ser: 1.2 mg/dL (ref 0.4–1.5)

## 2012-04-28 DIAGNOSIS — I2510 Atherosclerotic heart disease of native coronary artery without angina pectoris: Secondary | ICD-10-CM

## 2012-04-28 HISTORY — DX: Atherosclerotic heart disease of native coronary artery without angina pectoris: I25.10

## 2012-05-22 ENCOUNTER — Other Ambulatory Visit: Payer: Self-pay | Admitting: Family Medicine

## 2012-05-22 DIAGNOSIS — Z125 Encounter for screening for malignant neoplasm of prostate: Secondary | ICD-10-CM

## 2012-05-22 DIAGNOSIS — I10 Essential (primary) hypertension: Secondary | ICD-10-CM

## 2012-05-22 DIAGNOSIS — E7800 Pure hypercholesterolemia, unspecified: Secondary | ICD-10-CM

## 2012-05-24 ENCOUNTER — Other Ambulatory Visit (INDEPENDENT_AMBULATORY_CARE_PROVIDER_SITE_OTHER): Payer: Commercial Managed Care - PPO

## 2012-05-24 DIAGNOSIS — E7800 Pure hypercholesterolemia, unspecified: Secondary | ICD-10-CM

## 2012-05-24 DIAGNOSIS — I10 Essential (primary) hypertension: Secondary | ICD-10-CM

## 2012-05-24 DIAGNOSIS — Z125 Encounter for screening for malignant neoplasm of prostate: Secondary | ICD-10-CM

## 2012-05-24 LAB — COMPREHENSIVE METABOLIC PANEL
AST: 34 U/L (ref 0–37)
Albumin: 3.9 g/dL (ref 3.5–5.2)
BUN: 13 mg/dL (ref 6–23)
Calcium: 9.4 mg/dL (ref 8.4–10.5)
Chloride: 106 mEq/L (ref 96–112)
Glucose, Bld: 100 mg/dL — ABNORMAL HIGH (ref 70–99)
Potassium: 4.1 mEq/L (ref 3.5–5.1)

## 2012-05-24 LAB — LIPID PANEL
Cholesterol: 165 mg/dL (ref 0–200)
Total CHOL/HDL Ratio: 6
Triglycerides: 195 mg/dL — ABNORMAL HIGH (ref 0.0–149.0)

## 2012-05-25 ENCOUNTER — Other Ambulatory Visit: Payer: Commercial Managed Care - PPO

## 2012-06-01 ENCOUNTER — Ambulatory Visit (INDEPENDENT_AMBULATORY_CARE_PROVIDER_SITE_OTHER): Payer: Commercial Managed Care - PPO | Admitting: Family Medicine

## 2012-06-01 ENCOUNTER — Encounter: Payer: Self-pay | Admitting: Family Medicine

## 2012-06-01 VITALS — BP 118/66 | HR 70 | Temp 98.1°F | Ht 67.0 in | Wt 216.0 lb

## 2012-06-01 DIAGNOSIS — G609 Hereditary and idiopathic neuropathy, unspecified: Secondary | ICD-10-CM | POA: Diagnosis not present

## 2012-06-01 DIAGNOSIS — G2581 Restless legs syndrome: Secondary | ICD-10-CM

## 2012-06-01 DIAGNOSIS — Z1211 Encounter for screening for malignant neoplasm of colon: Secondary | ICD-10-CM

## 2012-06-01 DIAGNOSIS — I10 Essential (primary) hypertension: Secondary | ICD-10-CM

## 2012-06-01 DIAGNOSIS — E7800 Pure hypercholesterolemia, unspecified: Secondary | ICD-10-CM | POA: Diagnosis not present

## 2012-06-01 DIAGNOSIS — G4730 Sleep apnea, unspecified: Secondary | ICD-10-CM

## 2012-06-01 DIAGNOSIS — Z0000 Encounter for general adult medical examination without abnormal findings: Secondary | ICD-10-CM

## 2012-06-01 MED ORDER — AMLODIPINE BESYLATE 5 MG PO TABS: 5.0000 mg | ORAL_TABLET | Freq: Every day | ORAL | Status: DC

## 2012-06-01 MED ORDER — LISINOPRIL 5 MG PO TABS: 5.0000 mg | ORAL_TABLET | Freq: Every day | ORAL | Status: DC

## 2012-06-01 MED ORDER — GABAPENTIN 300 MG PO CAPS: 300.0000 mg | ORAL_CAPSULE | Freq: Every day | ORAL | Status: DC

## 2012-06-01 NOTE — Progress Notes (Signed)
Subjective:    Patient ID: Justin Ali, male    DOB: 06/11/1940, 72 y.o.   MRN: [id number]  HPI CC: welcome to medicare visit  Takes melatonin for sleep initiation insomnia. Compliant with BP and RLS meds.  Caffeine: occasional coffee Lives with wife, 1 cat Occupation: truck Paediatric nurse) Activity: no regular exercise Diet: rare water, some fruits/vegetables  Preventative:  Last blood work, physical  Colonoscopy: never had. Always normal stool kits. Wants to continue this. States mailed in last CPE but no records of this. Prostate - gets checked yearly. No nocturia, no weakness of stream.  No known fmhx prostate cancer Flu shot - 01/2012 Td - 2012 at [facility]  Pneumonia completed 2012  zostavax - insurance won't cover so not interested.  Advanced directives - has at home.  Wife would be HCPOA.  No desire for prolonged life support  No falls in last year.  No anhedonia. Denies depressed.  Hearing and vision checked regularly for DOT, last done 01/2012.  Declines screen today.  Medications and allergies reviewed and updated in chart.  Past histories reviewed and updated if relevant as below. Patient Active Problem List  Diagnosis  . HYPERCHOLESTEROLEMIA/TRIG, DECREASED HDL  . RESTLESS LEG SYNDROME  . PERIPHERAL NEUROPATHY  . HYPERTENSION, BENIGN ESSENTIAL  . SLEEP APNEA  . Health maintenance examination  . Left knee pain  . Transaminitis   Past Medical History  Diagnosis Date  . Hyperglycemia   . HTN (hypertension)   . Other benign neoplasm of connective and other soft tissue of unspecified site     Neurofibroma of lateral periorbital area  . Peripheral neuropathy   . Restless legs syndrome (RLS)   . OSA (obstructive sleep apnea)     did not tolerate CPAP  . HLD (hyperlipidemia)   . NAFLD (nonalcoholic fatty liver disease) 07/979    by abd [place] with increased LFTs   Past Surgical History  Procedure Date  . Skin cancer excision 1997    left anterior neck   . Orif ankle fracture 11/29/00    Dr. Katrinka Blazing   History  Substance Use Topics  . Smoking status: Former Smoker    Types: Cigars  . Smokeless tobacco: Never Used     Comment: rare cigar  . Alcohol Use: Yes     Comment: Weekends   Family History  Problem Relation Age of Onset  . Cancer Father     liver  . Cancer Mother     breast  . Hypertension Brother     Half-brother  . Coronary artery disease Paternal Aunt     CABG  . Cancer Other     GM; Aunt - breast  . Alcohol abuse Father    No Known Allergies Current Outpatient Prescriptions on File Prior to Visit  Medication Sig Dispense Refill  . amLODipine (NORVASC) 5 MG tablet Take 1 tablet (5 mg total) by mouth daily.  30 tablet  11  . cholecalciferol (VITAMIN D) 1000 UNITS tablet Take 1,000 Units by mouth daily.      Marland Kitchen gabapentin (NEURONTIN) 300 MG capsule Take 1 capsule (300 mg total) by mouth at bedtime.  30 capsule  11  . lisinopril (PRINIVIL,ZESTRIL) 5 MG tablet Take 1 tablet (5 mg total) by mouth daily.  30 tablet  11  . Multiple Vitamins-Minerals (MULTIVITAMIN PO) Take 1 tablet by mouth daily.         Review of Systems  Constitutional: Negative for fever, chills, activity change, appetite change, fatigue  and unexpected weight change.  HENT: Negative for hearing loss and neck pain.   Eyes: Negative for visual disturbance.  Respiratory: Negative for cough, chest tightness, shortness of breath and wheezing.   Cardiovascular: Negative for chest pain, palpitations and leg swelling.  Gastrointestinal: Negative for nausea, vomiting, abdominal pain, diarrhea, constipation, blood in stool and abdominal distention.  Genitourinary: Negative for hematuria and difficulty urinating.  Musculoskeletal: Negative for myalgias and arthralgias.  Skin: Negative for rash.  Neurological: Negative for dizziness, seizures, syncope and headaches.  Hematological: Does not bruise/bleed easily.  Psychiatric/Behavioral: Negative for dysphoric mood.  The patient is not nervous/anxious.        Objective:   Physical Exam  Nursing note and vitals reviewed. Constitutional: He is oriented to person, place, and time. He appears well-developed and well-nourished. No distress.  HENT:  Head: Normocephalic and atraumatic.  Right Ear: Hearing, tympanic membrane, external ear and ear canal normal.  Left Ear: Hearing, tympanic membrane, external ear and ear canal normal.  Nose: Nose normal.  Mouth/Throat: Oropharynx is clear and moist. No oropharyngeal exudate.  Eyes: Conjunctivae normal and EOM are normal. Pupils are equal, round, and reactive to light. No scleral icterus.  Neck: Normal range of motion. Neck supple. Carotid bruit is not present.  Cardiovascular: Normal rate, regular rhythm, normal heart sounds and intact distal pulses.   No murmur heard. Pulses:      Radial pulses are 2+ on the right side, and 2+ on the left side.  Pulmonary/Chest: Effort normal and breath sounds normal. No respiratory distress. He has no wheezes. He has no rales.  Abdominal: Soft. Bowel sounds are normal. He exhibits no distension and no mass. There is no tenderness. There is no rebound and no guarding.  Genitourinary: Rectum normal and prostate normal. Rectal exam shows no external hemorrhoid, no internal hemorrhoid, no fissure, no mass, no tenderness and anal tone normal. Guaiac negative stool. Prostate is not enlarged (20gm) and not tender.  Musculoskeletal: Normal range of motion. He exhibits no edema.  Lymphadenopathy:    He has no cervical adenopathy.  Neurological: He is alert and oriented to person, place, and time.       CN grossly intact, station and gait intact  Skin: Skin is warm and dry. No rash noted.  Psychiatric: He has a normal mood and affect. His behavior is normal. Judgment and thought content normal.       Assessment & Plan:

## 2012-06-01 NOTE — Patient Instructions (Addendum)
EKG today. Fill out medicare form. Stool kit today. meds refilled today. Good to see you today, call us with questions. Return in 1 year or as needed.

## 2012-06-02 DIAGNOSIS — Z0000 Encounter for general adult medical examination without abnormal findings: Secondary | ICD-10-CM | POA: Insufficient documentation

## 2012-06-02 NOTE — Assessment & Plan Note (Signed)
reviewed #s, discussed increased aerobic exercise to raise HDL Discussed triglycerides and need to increase fish or fish oil.

## 2012-06-02 NOTE — Assessment & Plan Note (Addendum)
Resolved transaminitis.  Fatty liver on [place].

## 2012-06-02 NOTE — Assessment & Plan Note (Addendum)
Welcome to medicare visit today. EKG - sinus arrhythmia rate 50-60s, normal axis, intervals, no hypertrophy or acute ST/T changes  I have personally reviewed the Medicare Annual Wellness questionnaire and have noted 1. The patient's medical and social history 2. Their use of alcohol, tobacco or illicit drugs 3. Their current medications and supplements 4. The patient's functional ability including ADL's, fall risks, home safety risks and hearing or visual impairment. 5. Diet and physical activity 6. Evidence for depression or mood disorders The patients weight, height, BMI have been recorded in the chart.  Hearing and vision has been addressed. I have made referrals, counseling and provided education to the patient based review of the above and I have provided the pt with a written personalized care plan for preventive services. See scanned questionairre. Advanced directives discussed: has at home, wants wife to be HCPOA  Reviewed preventative protocols and updated unless pt declined. UTD immunizations, not interested in zostavax. UTD colonoscopy. DRE/PSA reassuring today.  Discussed stopping screening.

## 2012-06-02 NOTE — Assessment & Plan Note (Signed)
States intolerant to CPAP. discussed importance of weight loss

## 2012-06-02 NOTE — Assessment & Plan Note (Signed)
Continue gabapentin.

## 2012-06-02 NOTE — Assessment & Plan Note (Signed)
Chronic, stable. Continue med regimen. 

## 2012-06-02 NOTE — Assessment & Plan Note (Signed)
-  continue gabapentin

## 2012-06-08 ENCOUNTER — Other Ambulatory Visit: Payer: Commercial Managed Care - PPO

## 2012-06-08 DIAGNOSIS — Z1211 Encounter for screening for malignant neoplasm of colon: Secondary | ICD-10-CM

## 2012-06-13 ENCOUNTER — Other Ambulatory Visit: Payer: Self-pay | Admitting: Family Medicine

## 2012-06-13 DIAGNOSIS — R195 Other fecal abnormalities: Secondary | ICD-10-CM

## 2012-06-14 ENCOUNTER — Encounter: Payer: Self-pay | Admitting: Internal Medicine

## 2012-07-26 ENCOUNTER — Ambulatory Visit (AMBULATORY_SURGERY_CENTER): Payer: Commercial Managed Care - PPO | Admitting: *Deleted

## 2012-07-26 VITALS — Ht 67.0 in | Wt 214.0 lb

## 2012-07-26 DIAGNOSIS — Z1211 Encounter for screening for malignant neoplasm of colon: Secondary | ICD-10-CM

## 2012-07-26 MED ORDER — NA SULFATE-K SULFATE-MG SULF 17.5-3.13-1.6 GM/177ML PO SOLN: ORAL | Status: DC

## 2012-07-27 HISTORY — PX: COLONOSCOPY: SHX174

## 2012-07-28 ENCOUNTER — Encounter: Payer: Self-pay | Admitting: Internal Medicine

## 2012-08-02 ENCOUNTER — Ambulatory Visit (AMBULATORY_SURGERY_CENTER): Payer: Commercial Managed Care - PPO | Admitting: Internal Medicine

## 2012-08-02 ENCOUNTER — Encounter: Payer: Self-pay | Admitting: Internal Medicine

## 2012-08-02 ENCOUNTER — Telehealth: Payer: Self-pay | Admitting: *Deleted

## 2012-08-02 VITALS — BP 100/64 | HR 51 | Temp 97.1°F | Resp 15 | Ht 67.0 in | Wt 214.0 lb

## 2012-08-02 DIAGNOSIS — Z1211 Encounter for screening for malignant neoplasm of colon: Secondary | ICD-10-CM | POA: Diagnosis not present

## 2012-08-02 DIAGNOSIS — D126 Benign neoplasm of colon, unspecified: Secondary | ICD-10-CM | POA: Diagnosis not present

## 2012-08-02 MED ORDER — SODIUM CHLORIDE 0.9 % IV SOLN: 500.0000 mL | INTRAVENOUS | Status: DC

## 2012-08-02 NOTE — Progress Notes (Addendum)
Patient did not have preoperative order for IV antibiotic SSI prophylaxis. (G8918)  Patient did not experience any of the following events: a burn prior to discharge; a fall within the facility; wrong site/side/patient/procedure/implant event; or a hospital transfer or hospital admission upon discharge from the facility. (G8907)  

## 2012-08-02 NOTE — Patient Instructions (Addendum)
I found and removed 5 polyps. They all appear benign but some were large and will probably need close follow-up. One of the sites was oozing blood so I placed 2 disposable metal clips to reduce chance of bleeding later. You should avoid MRI scanners for 1 month (May7).  Do not take aspirin or other anti-inflammatory medications/blood thinners for 2 weeks (until April 22).  I will let you know pathology results and when to have another routine colonoscopy by mail.  Thank you for choosing me and [facility].  Iva Boop, MD, FACG   YOU HAD AN ENDOSCOPIC PROCEDURE TODAY AT THE  ENDOSCOPY CENTER: Refer to the procedure report that was given to you for any specific questions about what was found during the examination.  If the procedure report does not answer your questions, please call your gastroenterologist to clarify.  If you requested that your care partner not be given the details of your procedure findings, then the procedure report has been included in a sealed envelope for you to review at your convenience later.  YOU SHOULD EXPECT: Some feelings of bloating in the abdomen. Passage of more gas than usual.  Walking can help get rid of the air that was put into your GI tract during the procedure and reduce the bloating. If you had a lower endoscopy (such as a colonoscopy or flexible sigmoidoscopy) you may notice spotting of blood in your stool or on the toilet paper. If you underwent a bowel prep for your procedure, then you may not have a normal bowel movement for a few days.  DIET: Your first meal following the procedure should be a light meal and then it is ok to progress to your normal diet.  A half-sandwich or bowl of soup is an example of a good first meal.  Heavy or fried foods are harder to digest and may make you feel nauseous or bloated.  Likewise meals heavy in dairy and vegetables can cause extra gas to form and this can also increase the bloating.  Drink plenty  of fluids but you should avoid alcoholic beverages for 24 hours.  ACTIVITY: Your care partner should take you home directly after the procedure.  You should plan to take it easy, moving slowly for the rest of the day.  You can resume normal activity the day after the procedure however you should NOT DRIVE or use heavy machinery for 24 hours (because of the sedation medicines used during the test).    SYMPTOMS TO REPORT IMMEDIATELY: A gastroenterologist can be reached at any hour.  During normal business hours, 8:30 AM to 5:00 PM Monday through Friday, call [phone].  After hours and on weekends, please call the GI answering service at [phone] who will take a message and have the physician on call contact you.   Following lower endoscopy (colonoscopy or flexible sigmoidoscopy):  Excessive amounts of blood in the stool  Significant tenderness or worsening of abdominal pains  Swelling of the abdomen that is new, acute  Fever of 100F or higher   FOLLOW UP: If any biopsies were taken you will be contacted by phone or by letter within the next 1-3 weeks.  Call your gastroenterologist if you have not heard about the biopsies in 3 weeks.  Our staff will call the home number listed on your records the next business day following your procedure to check on you and address any questions or concerns that you may have at that time regarding the  information given to you following your procedure. This is a courtesy call and so if there is no answer at the home number and we have not heard from you through the emergency physician on call, we will assume that you have returned to your regular daily activities without incident.  SIGNATURES/CONFIDENTIALITY: You and/or your care partner have signed paperwork which will be entered into your electronic medical record.  These signatures attest to the fact that that the information above on your After Visit Summary has been reviewed and is understood.   Full responsibility of the confidentiality of this discharge information lies with you and/or your care-partner.

## 2012-08-02 NOTE — Telephone Encounter (Signed)
Pt's wife dropped off EKG from Dr. Brennan Bailey for Dr. Sharen Hones to evaluate.    Best number for Justin Ali [phone]

## 2012-08-02 NOTE — Op Note (Signed)
[facility] [address] [place], [address]   COLONOSCOPY PROCEDURE REPORT  PATIENT: Justin Ali, Justin Ali  MR#: [id number] BIRTHDATE: 04-09-1941 , 71  yrs. old GENDER: Male ENDOSCOPIST: Iva Boop, MD, [facility] REFERRED NW:GNFAOZ Sharen Hones, M.D. PROCEDURE DATE:  08/02/2012 PROCEDURE:   Colonoscopy with snare polypectomy and Submucosal injection, any substance ASA CLASS:   Class II INDICATIONS:average risk screening. MEDICATIONS: propofol (Diprivan) 400mg  IV, MAC sedation, administered by CRNA, and These medications were titrated to patient response per physician's verbal order  DESCRIPTION OF PROCEDURE:   After the risks benefits and alternatives of the procedure were thoroughly explained, informed consent was obtained.  A digital rectal exam revealed no abnormalities of the rectum, A digital rectal exam revealed no prostatic nodules, and A digital rectal exam revealed the prostate was not enlarged.   The LB [id number]  endoscope was introduced through the anus and advanced to the cecum, which was identified by both the appendix and ileocecal valve. No adverse events experienced.   The quality of the prep was Suprep excellent The instrument was then slowly withdrawn as the colon was fully examined.     COLON FINDINGS: Five polypoid shaped sessile polyps measuring 5-20 mm in size were found at the cecum(51mm hot snare) , in the ascending colon (1 cm hot snare and 2 cm piecemeal hot snare with 2 clips place to reduce risk of bleeding as there was a persistent ooze (controlled), and submucosal SPOT injection also to mark area) at the hepatic flexure (8 mm cold snare), and in the transverse colon (5mm cold snare).   The resections were complete and the polyp tissue was completely retrieved.   The colon mucosa was otherwise normal.  Retroflexed views revealed no abnormalities. The time to cecum=1 minutes 12 seconds.  Withdrawal time=31 minutes  20 seconds.  The scope was withdrawn and the procedure completed. COMPLICATIONS: There were no complications.  ENDOSCOPIC IMPRESSION: 1.   Five sessile polyps measuring 5-20 mm in size were found at the cecum, in the ascending colon, at the hepatic flexure, and in the transverse colon; polypectomy was performed using snare cautery and with a cold snare and ascending site marked with SPOT. 2.   The colon mucosa was otherwise normal - excellent prep  RECOMMENDATIONS: 1.  Hold aspirin, aspirin products, and anti-inflammatory medication for 2 weeks. 2.  Timing of repeat colonoscopy will be determined by pathology findings. 3.   Likely within 1 year eSigned:  Iva Boop, MD, [facility] 08/02/2012 12:53 PM   cc: Eustaquio Boyden MD and The Patient

## 2012-08-02 NOTE — Progress Notes (Signed)
Called to room to assist during endoscopic procedure.  Patient ID and intended procedure confirmed with present staff. Received instructions for my participation in the procedure from the performing physician.  

## 2012-08-03 ENCOUNTER — Telehealth: Payer: Self-pay | Admitting: *Deleted

## 2012-08-03 NOTE — Telephone Encounter (Signed)
In your IN box for review.  

## 2012-08-03 NOTE — Telephone Encounter (Signed)
  Follow up Call-  Call back number 08/02/2012  Post procedure Call Back phone  # [phone]  Permission to leave phone message Yes     Patient questions:  Do you have a fever, pain , or abdominal swelling? no Pain Score  0 *  Have you tolerated food without any problems? yes  Have you been able to return to your normal activities? yes  Do you have any questions about your discharge instructions: Diet   no Medications  no Follow up visit  no  Do you have questions or concerns about your Care? no  Actions: * If pain score is 4 or above: No action needed, pain <4.

## 2012-08-04 NOTE — Telephone Encounter (Signed)
Patient notified and said he has been feeling just fine. No chest pain,SOB, palpitations or anything at rest or with exertion.

## 2012-08-04 NOTE — Telephone Encounter (Addendum)
plz notify I reviewed lead strip.  Overall looking ok (equal PR intervals throughout).  Likely sinus arrhythmia No need to f/u with me unless pt experiencing palpitations or chest pain or other concerning sxs.

## 2012-08-05 ENCOUNTER — Encounter: Payer: Self-pay | Admitting: Internal Medicine

## 2012-08-05 DIAGNOSIS — Z860100 Personal history of colon polyps, unspecified: Secondary | ICD-10-CM

## 2012-08-05 HISTORY — DX: Personal history of colon polyps, unspecified: Z86.0100

## 2012-08-05 HISTORY — DX: Personal history of colonic polyps: Z86.010

## 2012-08-05 NOTE — Progress Notes (Signed)
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

## 2012-08-06 ENCOUNTER — Encounter: Payer: Self-pay | Admitting: Family Medicine

## 2012-08-09 ENCOUNTER — Encounter: Payer: Commercial Managed Care - PPO | Admitting: Internal Medicine

## 2013-01-11 ENCOUNTER — Encounter: Payer: Self-pay | Admitting: Internal Medicine

## 2013-01-15 DIAGNOSIS — Z23 Encounter for immunization: Secondary | ICD-10-CM | POA: Diagnosis not present

## 2013-01-26 DIAGNOSIS — J189 Pneumonia, unspecified organism: Secondary | ICD-10-CM

## 2013-01-26 HISTORY — DX: Pneumonia, unspecified organism: J18.9

## 2013-02-07 ENCOUNTER — Encounter: Payer: Commercial Managed Care - PPO | Admitting: Internal Medicine

## 2013-02-08 ENCOUNTER — Ambulatory Visit (AMBULATORY_SURGERY_CENTER): Payer: Commercial Managed Care - PPO

## 2013-02-08 VITALS — Ht 67.0 in | Wt 214.0 lb

## 2013-02-08 DIAGNOSIS — Z86010 Personal history of colonic polyps: Secondary | ICD-10-CM

## 2013-02-08 MED ORDER — NA SULFATE-K SULFATE-MG SULF 17.5-3.13-1.6 GM/177ML PO SOLN: ORAL | Status: DC

## 2013-02-09 ENCOUNTER — Encounter: Payer: Self-pay | Admitting: Internal Medicine

## 2013-02-14 DIAGNOSIS — R079 Chest pain, unspecified: Secondary | ICD-10-CM | POA: Diagnosis not present

## 2013-02-14 DIAGNOSIS — J189 Pneumonia, unspecified organism: Secondary | ICD-10-CM | POA: Diagnosis not present

## 2013-02-14 DIAGNOSIS — N390 Urinary tract infection, site not specified: Secondary | ICD-10-CM | POA: Diagnosis not present

## 2013-02-14 DIAGNOSIS — D72829 Elevated white blood cell count, unspecified: Secondary | ICD-10-CM | POA: Diagnosis not present

## 2013-02-14 DIAGNOSIS — R0902 Hypoxemia: Secondary | ICD-10-CM | POA: Diagnosis not present

## 2013-02-14 LAB — CBC
HGB: 14.1 g/dL (ref 13.0–18.0)
MCH: 30.9 pg (ref 26.0–34.0)
MCV: 90 fL (ref 80–100)
Platelet: 178 10*3/uL (ref 150–440)
RBC: 4.57 10*6/uL (ref 4.40–5.90)

## 2013-02-14 LAB — COMPREHENSIVE METABOLIC PANEL
Albumin: 3.3 g/dL — ABNORMAL LOW (ref 3.4–5.0)
Anion Gap: 5 — ABNORMAL LOW (ref 7–16)
BUN: 16 mg/dL (ref 7–18)
Chloride: 106 mmol/L (ref 98–107)
EGFR (African American): 51 — ABNORMAL LOW
Osmolality: 278 (ref 275–301)
SGOT(AST): 12 U/L — ABNORMAL LOW (ref 15–37)
SGPT (ALT): 19 U/L (ref 12–78)
Sodium: 138 mmol/L (ref 136–145)
Total Protein: 7 g/dL (ref 6.4–8.2)

## 2013-02-14 LAB — MAGNESIUM: Magnesium: 1.6 mg/dL — ABNORMAL LOW

## 2013-02-14 LAB — URINALYSIS, COMPLETE
Blood: NEGATIVE
Ketone: NEGATIVE
Leukocyte Esterase: NEGATIVE
Nitrite: NEGATIVE
Ph: 6 (ref 4.5–8.0)

## 2013-02-15 ENCOUNTER — Inpatient Hospital Stay: Payer: Self-pay | Admitting: Student

## 2013-02-15 DIAGNOSIS — Z79899 Other long term (current) drug therapy: Secondary | ICD-10-CM | POA: Diagnosis not present

## 2013-02-15 DIAGNOSIS — N390 Urinary tract infection, site not specified: Secondary | ICD-10-CM | POA: Diagnosis not present

## 2013-02-15 DIAGNOSIS — A419 Sepsis, unspecified organism: Secondary | ICD-10-CM | POA: Diagnosis not present

## 2013-02-15 DIAGNOSIS — Z803 Family history of malignant neoplasm of breast: Secondary | ICD-10-CM | POA: Diagnosis not present

## 2013-02-15 DIAGNOSIS — D72829 Elevated white blood cell count, unspecified: Secondary | ICD-10-CM | POA: Diagnosis not present

## 2013-02-15 DIAGNOSIS — I10 Essential (primary) hypertension: Secondary | ICD-10-CM | POA: Diagnosis not present

## 2013-02-15 DIAGNOSIS — J9600 Acute respiratory failure, unspecified whether with hypoxia or hypercapnia: Secondary | ICD-10-CM | POA: Diagnosis not present

## 2013-02-15 DIAGNOSIS — R6510 Systemic inflammatory response syndrome (SIRS) of non-infectious origin without acute organ dysfunction: Secondary | ICD-10-CM | POA: Diagnosis not present

## 2013-02-15 DIAGNOSIS — Z7982 Long term (current) use of aspirin: Secondary | ICD-10-CM | POA: Diagnosis not present

## 2013-02-15 DIAGNOSIS — Z86010 Personal history of colonic polyps: Secondary | ICD-10-CM | POA: Diagnosis not present

## 2013-02-15 DIAGNOSIS — I129 Hypertensive chronic kidney disease with stage 1 through stage 4 chronic kidney disease, or unspecified chronic kidney disease: Secondary | ICD-10-CM | POA: Diagnosis not present

## 2013-02-15 DIAGNOSIS — J189 Pneumonia, unspecified organism: Secondary | ICD-10-CM | POA: Diagnosis not present

## 2013-02-15 LAB — BASIC METABOLIC PANEL
Anion Gap: 6 — ABNORMAL LOW (ref 7–16)
BUN: 15 mg/dL (ref 7–18)
Calcium, Total: 8.4 mg/dL — ABNORMAL LOW (ref 8.5–10.1)
Chloride: 109 mmol/L — ABNORMAL HIGH (ref 98–107)
Creatinine: 1.61 mg/dL — ABNORMAL HIGH (ref 0.60–1.30)
EGFR (African American): 49 — ABNORMAL LOW
EGFR (Non-African Amer.): 42 — ABNORMAL LOW
Glucose: 142 mg/dL — ABNORMAL HIGH (ref 65–99)
Osmolality: 283 (ref 275–301)
Potassium: 3.5 mmol/L (ref 3.5–5.1)

## 2013-02-16 LAB — CBC WITH DIFFERENTIAL/PLATELET
Basophil %: 0.4 %
Eosinophil %: 0.9 %
HCT: 35.6 % — ABNORMAL LOW (ref 40.0–52.0)
HGB: 12.3 g/dL — ABNORMAL LOW (ref 13.0–18.0)
Lymphocyte #: 1.1 10*3/uL (ref 1.0–3.6)
Lymphocyte %: 12.1 %
MCH: 31.2 pg (ref 26.0–34.0)
MCHC: 34.6 g/dL (ref 32.0–36.0)
MCV: 90 fL (ref 80–100)
Monocyte #: 0.8 x10 3/mm (ref 0.2–1.0)
Monocyte %: 9.4 %
Neutrophil #: 6.8 10*3/uL — ABNORMAL HIGH (ref 1.4–6.5)
Platelet: 166 10*3/uL (ref 150–440)
WBC: 8.9 10*3/uL (ref 3.8–10.6)

## 2013-02-16 LAB — BASIC METABOLIC PANEL
Anion Gap: 7 (ref 7–16)
BUN: 14 mg/dL (ref 7–18)
Calcium, Total: 8.5 mg/dL (ref 8.5–10.1)
Chloride: 111 mmol/L — ABNORMAL HIGH (ref 98–107)
Creatinine: 1.6 mg/dL — ABNORMAL HIGH (ref 0.60–1.30)
Osmolality: 283 (ref 275–301)

## 2013-02-17 ENCOUNTER — Telehealth: Payer: Self-pay

## 2013-02-17 LAB — URINE CULTURE

## 2013-02-17 NOTE — Telephone Encounter (Signed)
Mrs Carlisi wanted Dr Reece Agar to know that pt was admitted to [facility] Rm [facility] on 02/14/13 with pneumonia. Pt is still running fever of 100 and pt continues to hurt in lt lung. Mrs Krupka wanted Dr Reece Agar to be aware.

## 2013-02-17 NOTE — Telephone Encounter (Signed)
Noted. Thanks.  plz call for an update tomorrow and to schedule f/u visit with myself when discharged.

## 2013-02-18 NOTE — Telephone Encounter (Signed)
Spoke with patient. He is still in-patient at [facility]. He said he feels some better, but still has low-grade temp. HE isn't sure when he will be discharged, but will call for follow-up once he is.

## 2013-02-21 ENCOUNTER — Encounter: Payer: Commercial Managed Care - PPO | Admitting: Internal Medicine

## 2013-02-21 LAB — EXPECTORATED SPUTUM ASSESSMENT W GRAM STAIN, RFLX TO RESP C

## 2013-02-23 ENCOUNTER — Encounter: Payer: Self-pay | Admitting: Internal Medicine

## 2013-02-26 HISTORY — PX: COLONOSCOPY: SHX174

## 2013-02-28 ENCOUNTER — Encounter: Payer: Self-pay | Admitting: Family Medicine

## 2013-02-28 ENCOUNTER — Ambulatory Visit: Payer: Commercial Managed Care - PPO | Admitting: Family Medicine

## 2013-02-28 ENCOUNTER — Ambulatory Visit (INDEPENDENT_AMBULATORY_CARE_PROVIDER_SITE_OTHER): Payer: Commercial Managed Care - PPO | Admitting: Family Medicine

## 2013-02-28 ENCOUNTER — Ambulatory Visit (AMBULATORY_SURGERY_CENTER): Payer: Commercial Managed Care - PPO | Admitting: *Deleted

## 2013-02-28 VITALS — BP 132/70 | HR 78 | Temp 98.3°F | Wt 201.8 lb

## 2013-02-28 VITALS — Ht 66.0 in | Wt 201.6 lb

## 2013-02-28 DIAGNOSIS — Z86010 Personal history of colonic polyps: Secondary | ICD-10-CM

## 2013-02-28 DIAGNOSIS — G609 Hereditary and idiopathic neuropathy, unspecified: Secondary | ICD-10-CM

## 2013-02-28 DIAGNOSIS — J189 Pneumonia, unspecified organism: Secondary | ICD-10-CM

## 2013-02-28 NOTE — Assessment & Plan Note (Signed)
Mainly R 5th finger tip paresthesia. Started during hospital. Will need B12 checked next blood work.  Advised to notify me if worsening. (h/o periph neuropathy in past).

## 2013-02-28 NOTE — Progress Notes (Signed)
No egg or soy products allergy. ewm No problems with past sedation. ewm No cpap or home 02 use. ewm Pt declined emmi video. ewm Pt already has suprep at home from previously scheduled colon in October, [place]

## 2013-02-28 NOTE — Patient Instructions (Signed)
Return next month for repeat xray of lungs (early to mid December). I think you are doing well. Should continue to slowly improve. Return in February for medicare wellness visit. Let us know sooner if questions.  Good to see you today.

## 2013-02-28 NOTE — Assessment & Plan Note (Addendum)
Continues to improve daily. Has completed 10d abx course (last 5d were vantin).  ?aphthous ulcers from vantin. No longer requiring supplemental O2. Return 03/2013 for rpt CXR. S/p pneumovax 2012, consider prevnar at [facility].

## 2013-02-28 NOTE — Progress Notes (Signed)
  Subjective:    Patient ID: Justin Ali, male    DOB: January 01, 1941, 72 y.o.   MRN: [id number]  HPI CC: hosp f/u  Mr. Ganim presents today as hospital f/u for recent [facility] hospitalization from 10/21-26/2014 for severe sepsis with acute respiratyr failure 2/2 PNA.  He also had renal insufficiency and hypomagnesemia.  Treated with CTX, azithromycin.  Rocephin was increased to bid given worsening of pneumonia.  Able to be weaned off oxygen.  Discharged with vantin to complete 10 d course.  Completed antibiotic course.  Staying some hoarse.    Recheck xray recommended for 6 wks.  hosp records (H&P and D/C summary) reviewed. Cr 1.6 prior to discharge.  Baseline Cr 1.1 (04/2012) CXR - LUL PNA  Noticing R finger numbness anterior 5th digit.  No pain.  Neck or arm pain.  No recent b12 level. Continues driving truck.  Pneumovax 2012. Did receive flu shot this year.  Past Medical History  Diagnosis Date  . Hyperglycemia   . HTN (hypertension)   . Other benign neoplasm of connective and other soft tissue of unspecified site     Neurofibroma of lateral periorbital area  . Peripheral neuropathy   . Restless legs syndrome (RLS)   . OSA (obstructive sleep apnea)     did not tolerate CPAP  . HLD (hyperlipidemia)   . NAFLD (nonalcoholic fatty liver disease) 07/979    by abd [place] with increased LFTs  . Personal history of colonic adenomas 08/05/2012     Review of Systems Per HPI    Objective:   Physical Exam  Nursing note and vitals reviewed. Constitutional: He appears well-developed and well-nourished. No distress.  HENT:  Mouth/Throat: No oropharyngeal exudate.  Shallow ulcers bilateral inner cheeks  Eyes: Conjunctivae and EOM are normal. Pupils are equal, round, and reactive to light. No scleral icterus.  Cardiovascular: Normal rate, regular rhythm, normal heart sounds and intact distal pulses.   No murmur heard. Pulmonary/Chest: Effort normal. No respiratory distress. He has no  wheezes. He has rales (faint LUL). He exhibits no tenderness.  Musculoskeletal: He exhibits no edema.  Faint decreased sensation R 5th tip of digit       Assessment & Plan:

## 2013-03-04 ENCOUNTER — Encounter: Payer: Self-pay | Admitting: Internal Medicine

## 2013-03-07 ENCOUNTER — Ambulatory Visit: Payer: Commercial Managed Care - PPO | Admitting: Family Medicine

## 2013-03-18 ENCOUNTER — Encounter: Payer: Self-pay | Admitting: Internal Medicine

## 2013-03-18 ENCOUNTER — Ambulatory Visit (AMBULATORY_SURGERY_CENTER): Payer: Commercial Managed Care - PPO | Admitting: Internal Medicine

## 2013-03-18 VITALS — BP 115/72 | HR 61 | Temp 98.4°F | Resp 16 | Ht 66.0 in | Wt 201.0 lb

## 2013-03-18 DIAGNOSIS — D126 Benign neoplasm of colon, unspecified: Secondary | ICD-10-CM

## 2013-03-18 DIAGNOSIS — K5520 Angiodysplasia of colon without hemorrhage: Secondary | ICD-10-CM | POA: Insufficient documentation

## 2013-03-18 HISTORY — DX: Angiodysplasia of colon without hemorrhage: K55.20

## 2013-03-18 MED ORDER — SODIUM CHLORIDE 0.9 % IV SOLN: 500.0000 mL | INTRAVENOUS | Status: DC

## 2013-03-18 MED ORDER — ASPIRIN 81 MG PO TABS: 81.0000 mg | ORAL_TABLET | Freq: Every day | ORAL | Status: AC

## 2013-03-18 NOTE — Progress Notes (Signed)
Patient did not experience any of the following events: a burn prior to discharge; a fall within the facility; wrong site/side/patient/procedure/implant event; or a hospital transfer or hospital admission upon discharge from the facility. (G8907) Patient did not have preoperative order for IV antibiotic SSI prophylaxis. (G8918)  

## 2013-03-18 NOTE — Op Note (Signed)
[facility] [address] [place], [address]   COLONOSCOPY PROCEDURE REPORT  PATIENT: Edinson, Domeier  MR#: [id number] BIRTHDATE: [birth year] , 72  yrs. old GENDER: Male ENDOSCOPIST: Iva Boop, MD, [facility] PROCEDURE DATE:  03/18/2013 PROCEDURE:   Colonoscopy with snare polypectomy First Screening Colonoscopy - Avg.  risk and is 50 yrs.  old or older - No.  Prior Negative Screening - Now for repeat screening. N/A  History of Adenoma - Now for follow-up colonoscopy & has been > or = to 3 yrs.  No.  It has been less than 3 yrs since last colonoscopy.  Medical reason.  Polyps Removed Today? Yes. ASA CLASS:   Class II INDICATIONS:close polyp f/u. MEDICATIONS: propofol (Diprivan) 350mg  IV, MAC sedation, administered by CRNA, and These medications were titrated to patient response per physician's verbal order  DESCRIPTION OF PROCEDURE:   After the risks benefits and alternatives of the procedure were thoroughly explained, informed consent was obtained.  A digital rectal exam revealed no abnormalities of the rectum, A digital rectal exam revealed no prostatic nodules, and A digital rectal exam revealed the prostate was not enlarged.   The [id number]  endoscope was introduced through the anus and advanced to the cecum, which was identified by both the appendix and ileocecal valve. No adverse events experienced.   The quality of the prep was excellent using Suprep  The instrument was then slowly withdrawn as the colon was fully examined.      COLON FINDINGS: A sessile polyp measuring 8-10 mm in size was found in the ascending colon. This was residual ascending polyp removed 07/2012. A polypectomy was performed using snare cautery.  The resection was complete and the polyp tissue was completely retrieved and site treated with tip cautery.  Five sessile polyps measuring 3-8 mm in size were found at the hepatic flexure and in the transverse colon.  A  polypectomy was performed using snare cautery and with a cold snare.  The resection was complete and the polyp tissue was completely retrieved.  There was a 2 mm non-bleeding cecal AVM.  The colon mucosa was otherwise normal. Retroflexed views revealed no abnormalities. The time to cecum=1 minutes 29 seconds.  Withdrawal time=23 minutes 05 seconds.  The scope was withdrawn and the procedure completed. COMPLICATIONS: There were no complications.  ENDOSCOPIC IMPRESSION: 1.   Sessile polyp - residual -  measuring 8-10 mm in size was found in the ascending colon; polypectomy was performed using snare cautery 2.   Five sessile polyps measuring 3-8 mm in size were found at the hepatic flexure and in the transverse colon; polypectomy was performed using snare cautery and with a cold snare 3.   The colon mucosa was otherwise normal except for a 2 mm cecal angiodysplasia (AVM)  RECOMMENDATIONS: 1.  Timing of repeat colonoscopy will be determined by pathology findings. 2.  Hold aspirin, aspirin products, and anti-inflammatory medication for 2 weeks.   eSigned:  Iva Boop, MD, [facility] 03/18/2013 4:09 PM   cc: Eustaquio Boyden MD and The Patient   PATIENT NAME:  Koby, Pickup MR#: [id number]

## 2013-03-18 NOTE — Patient Instructions (Addendum)
There was some residual polyp removed where I found the large polyp in April. 5 other polyps were seen and removed today - max 8 mm.  There was a small angiodysplasia in cecum, blood vessels on surface not usually a problem.  I will let you know pathology results and when to have another routine colonoscopy by mail.  I appreciate the opportunity to care for you. Iva Boop, MD, [facility]   Discharge instructions given with verbal understanding. Handouts on polyps. Hold aspirin and anything containing aspirin for two weeks. Resume previous medications. YOU HAD AN ENDOSCOPIC PROCEDURE TODAY AT THE [facility]: Refer to the procedure report that was given to you for any specific questions about what was found during the examination.  If the procedure report does not answer your questions, please call your gastroenterologist to clarify.  If you requested that your care partner not be given the details of your procedure findings, then the procedure report has been included in a sealed envelope for you to review at your convenience later.  YOU SHOULD EXPECT: Some feelings of bloating in the abdomen. Passage of more gas than usual.  Walking can help get rid of the air that was put into your GI tract during the procedure and reduce the bloating. If you had a lower endoscopy (such as a colonoscopy or flexible sigmoidoscopy) you may notice spotting of blood in your stool or on the toilet paper. If you underwent a bowel prep for your procedure, then you may not have a normal bowel movement for a few days.  DIET: Your first meal following the procedure should be a light meal and then it is ok to progress to your normal diet.  A half-sandwich or bowl of soup is an example of a good first meal.  Heavy or fried foods are harder to digest and may make you feel nauseous or bloated.  Likewise meals heavy in dairy and vegetables can cause extra gas to form and this can also increase the bloating.  Drink  plenty of fluids but you should avoid alcoholic beverages for 24 hours.  ACTIVITY: Your care partner should take you home directly after the procedure.  You should plan to take it easy, moving slowly for the rest of the day.  You can resume normal activity the day after the procedure however you should NOT DRIVE or use heavy machinery for 24 hours (because of the sedation medicines used during the test).    SYMPTOMS TO REPORT IMMEDIATELY: A gastroenterologist can be reached at any hour.  During normal business hours, 8:30 AM to 5:00 PM Monday through Friday, call [phone].  After hours and on weekends, please call the GI answering service at [phone] who will take a message and have the physician on call contact you.   Following lower endoscopy (colonoscopy or flexible sigmoidoscopy):  Excessive amounts of blood in the stool  Significant tenderness or worsening of abdominal pains  Swelling of the abdomen that is new, acute  Fever of 100F or higher  FOLLOW UP: If any biopsies were taken you will be contacted by phone or by letter within the next 1-3 weeks.  Call your gastroenterologist if you have not heard about the biopsies in 3 weeks.  Our staff will call the home number listed on your records the next business day following your procedure to check on you and address any questions or concerns that you may have at that time regarding the information given to you  following your procedure. This is a courtesy call and so if there is no answer at the home number and we have not heard from you through the emergency physician on call, we will assume that you have returned to your regular daily activities without incident.  SIGNATURES/CONFIDENTIALITY: You and/or your care partner have signed paperwork which will be entered into your electronic medical record.  These signatures attest to the fact that that the information above on your After Visit Summary has been reviewed and is understood.   Full responsibility of the confidentiality of this discharge information lies with you and/or your care-partner.

## 2013-03-18 NOTE — Progress Notes (Signed)
Called to room to assist during endoscopic procedure.  Patient ID and intended procedure confirmed with present staff. Received instructions for my participation in the procedure from the performing physician.  

## 2013-03-21 ENCOUNTER — Telehealth: Payer: Self-pay | Admitting: *Deleted

## 2013-03-21 NOTE — Telephone Encounter (Signed)
  Follow up Call-  Call back number 03/18/2013 08/02/2012  Post procedure Call Back phone  # [phone]  Permission to leave phone message Yes Yes     Patient questions:  Do you have a fever, pain , or abdominal swelling? no Pain Score  0 *  Have you tolerated food without any problems? yes  Have you been able to return to your normal activities? yes  Do you have any questions about your discharge instructions: Diet   no Medications  no Follow up visit  no  Do you have questions or concerns about your Care? no  Actions: * If pain score is 4 or above: No action needed, pain <4.

## 2013-03-23 ENCOUNTER — Encounter: Payer: Self-pay | Admitting: Internal Medicine

## 2013-03-23 NOTE — Progress Notes (Signed)
Quick Note:  Residual TV aeenoma and 5 tubular adenomas up To 8 mm max repeat colon 1 year 02/2016 ______

## 2013-03-28 ENCOUNTER — Encounter: Payer: Self-pay | Admitting: Family Medicine

## 2013-04-01 ENCOUNTER — Ambulatory Visit (INDEPENDENT_AMBULATORY_CARE_PROVIDER_SITE_OTHER)
Admission: RE | Admit: 2013-04-01 | Discharge: 2013-04-01 | Disposition: A | Discharge status: Home / Self Care | Payer: Medicare Other | Source: Ambulatory Visit | Attending: Family Medicine | Admitting: Family Medicine

## 2013-04-01 ENCOUNTER — Ambulatory Visit
Admission: RE | Admit: 2013-04-01 | Discharge: 2013-04-01 | Disposition: A | Discharge status: Home / Self Care | Payer: Medicare Other | Source: Ambulatory Visit | Attending: Family Medicine | Admitting: Family Medicine

## 2013-04-01 DIAGNOSIS — J984 Other disorders of lung: Secondary | ICD-10-CM | POA: Diagnosis not present

## 2013-04-01 DIAGNOSIS — J189 Pneumonia, unspecified organism: Secondary | ICD-10-CM

## 2013-04-03 ENCOUNTER — Other Ambulatory Visit: Payer: Self-pay | Admitting: Family Medicine

## 2013-04-03 DIAGNOSIS — J189 Pneumonia, unspecified organism: Secondary | ICD-10-CM

## 2013-04-25 ENCOUNTER — Ambulatory Visit (INDEPENDENT_AMBULATORY_CARE_PROVIDER_SITE_OTHER)
Admission: RE | Admit: 2013-04-25 | Discharge: 2013-04-25 | Disposition: A | Discharge status: Home / Self Care | Payer: Medicare Other | Source: Ambulatory Visit | Attending: Family Medicine | Admitting: Family Medicine

## 2013-04-25 ENCOUNTER — Ambulatory Visit (INDEPENDENT_AMBULATORY_CARE_PROVIDER_SITE_OTHER): Payer: Medicare Other | Admitting: Family Medicine

## 2013-04-25 ENCOUNTER — Encounter: Payer: Self-pay | Admitting: Family Medicine

## 2013-04-25 VITALS — BP 128/72 | HR 78 | Temp 98.0°F

## 2013-04-25 DIAGNOSIS — J189 Pneumonia, unspecified organism: Secondary | ICD-10-CM

## 2013-04-25 DIAGNOSIS — R05 Cough: Secondary | ICD-10-CM

## 2013-04-25 DIAGNOSIS — J9819 Other pulmonary collapse: Secondary | ICD-10-CM | POA: Diagnosis not present

## 2013-04-25 DIAGNOSIS — R059 Cough, unspecified: Secondary | ICD-10-CM | POA: Insufficient documentation

## 2013-04-25 LAB — POCT INFLUENZA A/B
Influenza A, POC: NEGATIVE
Influenza B, POC: NEGATIVE

## 2013-04-25 MED ORDER — AZITHROMYCIN 250 MG PO TABS: ORAL_TABLET | ORAL | Status: DC

## 2013-04-25 NOTE — Progress Notes (Signed)
Subjective:    Patient ID: Justin Ali, male    DOB: 06/25/1940, 72 y.o.   MRN: [id number]  HPI CC: recheck  Justin Ali presents today as f/u visit after recent dx LUL PNA for which he was admitted to [facility] from 10/21-26/2014 for severe sepsis with acute respiratory failure 2/2 PNA. Treated with CTX, azithromycin. Rocephin was increased to bid given worsening of pneumonia. Weaned off oxygen and discharged with vantin to complete 10 d course. In interim, rpt CXR 04/05/2013 showed persistent haziness LUL, rec rpt CXR in 4-6 wks.    Pt presents today as he started feeling worse.  2d ago started feeling worse - worsening cough, chest pain with cough, runny diarrhea (3 times today so far), lightheaded.  Cough mainly present in the morning.  No wheezing, dyspnea.  No fevers/chills, no significant congestion.  No headaches, body aches, ear or tooth pain.  Has continued going to gym every day - treadmill/stationary bike and lifting weights Did receive flu shot this year. Ex smoker - quit 2004.  Current Outpatient Prescriptions on File Prior to Visit  Medication Sig Dispense Refill  . amLODipine (NORVASC) 5 MG tablet Take 1 tablet (5 mg total) by mouth daily.  30 tablet  11  . Calcium Carbonate (CALTRATE 600 PO) Take by mouth daily.      . cholecalciferol (VITAMIN D) 1000 UNITS tablet Take 5,000 Units by mouth daily.       . fish oil-omega-3 fatty acids 1000 MG capsule Take 2 g by mouth 3 (three) times a week.       . gabapentin (NEURONTIN) 300 MG capsule Take 300 mg by mouth as needed.      Marland Kitchen lisinopril (PRINIVIL,ZESTRIL) 5 MG tablet Take 1 tablet (5 mg total) by mouth daily.  30 tablet  11  . Melatonin 10 MG CAPS Take 1 capsule by mouth as needed.      . Multiple Vitamins-Minerals (MULTIVITAMIN PO) Take 1 tablet by mouth daily.       No current facility-administered medications on file prior to visit.    Past Medical History  Diagnosis Date  . Hyperglycemia   . HTN (hypertension)   .  Other benign neoplasm of connective and other soft tissue of unspecified site     Neurofibroma of lateral periorbital area  . Peripheral neuropathy   . Restless legs syndrome (RLS)   . OSA (obstructive sleep apnea)     did not tolerate CPAP  . HLD (hyperlipidemia)   . NAFLD (nonalcoholic fatty liver disease) 10/8467    by abd [place] with increased LFTs  . Personal history of colonic adenomas 08/05/2012  . Pneumonia 01/2013  . Angiodysplasia of cecum 03/18/2013    2 mm - non-bleeding 03/18/2013 colonoscopy      Review of Systems Per HPI    Objective:   Physical Exam  Nursing note and vitals reviewed. Constitutional: He appears well-developed and well-nourished. No distress.  HENT:  Head: Normocephalic and atraumatic.  Right Ear: Hearing, tympanic membrane, external ear and ear canal normal.  Left Ear: Hearing, tympanic membrane, external ear and ear canal normal.  Nose: Nose normal. No mucosal edema or rhinorrhea. Right sinus exhibits no maxillary sinus tenderness and no frontal sinus tenderness. Left sinus exhibits no maxillary sinus tenderness and no frontal sinus tenderness.  Mouth/Throat: Uvula is midline, oropharynx is clear and moist and mucous membranes are normal. No oropharyngeal exudate, posterior oropharyngeal edema, posterior oropharyngeal erythema or tonsillar abscesses.  Eyes: Conjunctivae and  EOM are normal. Pupils are equal, round, and reactive to light. No scleral icterus.  Neck: Normal range of motion. Neck supple.  Cardiovascular: Normal rate, regular rhythm, normal heart sounds and intact distal pulses.   No murmur heard. Pulmonary/Chest: Effort normal and breath sounds normal. No respiratory distress. He has no wheezes. He has no rales.  Musculoskeletal: He exhibits no edema.  Lymphadenopathy:    He has no cervical adenopathy.  Skin: Skin is warm and dry. No rash noted.       Assessment & Plan:

## 2013-04-25 NOTE — Progress Notes (Signed)
Pre-visit discussion using our clinic review tool. No additional management support is needed unless otherwise documented below in the visit note.  

## 2013-04-25 NOTE — Patient Instructions (Signed)
Flu swab today. Hold zpack in case cough not improving or any worsening. We will call you with results of xray.  If no change, we may discuss CT scan. May continue robitussin. Let me know if worsening despite antibiotic or fever >101 or worsening productive cough.

## 2013-04-25 NOTE — Assessment & Plan Note (Signed)
Given recent hx and comorbidities - check flu swab today.  If negative, will provide with WASP for zpack. CXR - overall unchanged on my read.  If radiologist agrees unchanged, will consider CT scan to further evaluate nonhealing LUL PNA in ex smoker. Pt agrees with plan.

## 2013-04-25 NOTE — Addendum Note (Signed)
Addended by: Josph Macho A on: 04/25/2013 09:31 AM   Modules accepted: Orders

## 2013-04-26 ENCOUNTER — Telehealth: Payer: Self-pay

## 2013-04-26 DIAGNOSIS — J189 Pneumonia, unspecified organism: Secondary | ICD-10-CM

## 2013-04-26 MED ORDER — GUAIFENESIN-CODEINE 100-10 MG/5ML PO SYRP: 5.0000 mL | ORAL_SOLUTION | Freq: Two times a day (BID) | ORAL | Status: DC | PRN

## 2013-04-26 NOTE — Telephone Encounter (Signed)
Pt left v/m pt was seen 04/25/13 and yesterday afternoon fever went up and down; at 6:30 this morning was 101.1, now temp is 99.3. Pt wants to know what he should do for fever and should pt take antibiotic.Please advise.

## 2013-04-26 NOTE — Telephone Encounter (Signed)
Spoke with patient. Fever staying 99-101.  Persistent cough. Lab Results  Component Value Date   CREATININE 1.1 05/24/2012  will call in cheratussin cough syrup to [facility] and recommended he fill zpack. Given unresolving pneumonia with persistent sxs and h/o smoker, will obtain CT with contrast for further evaluation   Discussed pt will need to come in for Cr.  Ordered this in chart as well.

## 2013-04-27 ENCOUNTER — Other Ambulatory Visit (INDEPENDENT_AMBULATORY_CARE_PROVIDER_SITE_OTHER): Payer: Medicare Other

## 2013-04-27 DIAGNOSIS — J189 Pneumonia, unspecified organism: Secondary | ICD-10-CM | POA: Diagnosis not present

## 2013-04-27 LAB — BASIC METABOLIC PANEL
CO2: 29 mEq/L (ref 19–32)
Calcium: 8.8 mg/dL (ref 8.4–10.5)
GFR: 61 mL/min (ref >60.00)
Sodium: 139 mEq/L (ref 135–145)

## 2013-04-29 ENCOUNTER — Other Ambulatory Visit: Payer: Self-pay | Admitting: Family Medicine

## 2013-04-29 ENCOUNTER — Ambulatory Visit (INDEPENDENT_AMBULATORY_CARE_PROVIDER_SITE_OTHER)
Admission: RE | Admit: 2013-04-29 | Discharge: 2013-04-29 | Disposition: A | Discharge status: Home / Self Care | Payer: Medicare Other | Source: Ambulatory Visit | Attending: Family Medicine | Admitting: Family Medicine

## 2013-04-29 DIAGNOSIS — J189 Pneumonia, unspecified organism: Secondary | ICD-10-CM

## 2013-04-29 DIAGNOSIS — J181 Lobar pneumonia, unspecified organism: Principal | ICD-10-CM

## 2013-04-29 MED ORDER — IOHEXOL 300 MG/ML  SOLN
80.0000 mL | Freq: Once | INTRAMUSCULAR | Status: AC | PRN
  Administered 2013-04-29: 80 mL via INTRAVENOUS

## 2013-04-29 MED ORDER — ASPIRIN EC 81 MG PO TBEC: 81.0000 mg | DELAYED_RELEASE_TABLET | Freq: Every day | ORAL | Status: DC

## 2013-05-25 ENCOUNTER — Other Ambulatory Visit: Payer: Self-pay | Admitting: Family Medicine

## 2013-05-25 DIAGNOSIS — Z125 Encounter for screening for malignant neoplasm of prostate: Secondary | ICD-10-CM

## 2013-05-25 DIAGNOSIS — E559 Vitamin D deficiency, unspecified: Secondary | ICD-10-CM

## 2013-05-25 DIAGNOSIS — E7800 Pure hypercholesterolemia, unspecified: Secondary | ICD-10-CM

## 2013-05-26 ENCOUNTER — Other Ambulatory Visit (INDEPENDENT_AMBULATORY_CARE_PROVIDER_SITE_OTHER): Payer: Medicare Other

## 2013-05-26 DIAGNOSIS — E7800 Pure hypercholesterolemia, unspecified: Secondary | ICD-10-CM

## 2013-05-26 DIAGNOSIS — E559 Vitamin D deficiency, unspecified: Secondary | ICD-10-CM | POA: Diagnosis not present

## 2013-05-26 DIAGNOSIS — Z125 Encounter for screening for malignant neoplasm of prostate: Secondary | ICD-10-CM | POA: Diagnosis not present

## 2013-05-26 LAB — LIPID PANEL
CHOL/HDL RATIO: 5
Cholesterol: 133 mg/dL (ref 0–200)
HDL: 24.8 mg/dL — ABNORMAL LOW (ref >39.00)
Triglycerides: 237 mg/dL — ABNORMAL HIGH (ref 0.0–149.0)
VLDL: 47.4 mg/dL — ABNORMAL HIGH (ref 0.0–40.0)

## 2013-05-26 LAB — LDL CHOLESTEROL, DIRECT: LDL DIRECT: 67.9 mg/dL

## 2013-05-26 LAB — PSA: PSA: 2.5 ng/mL (ref 0.10–4.00)

## 2013-05-27 LAB — VITAMIN D 25 HYDROXY (VIT D DEFICIENCY, FRACTURES): Vit D, 25-Hydroxy: 94 ng/mL — ABNORMAL HIGH (ref 30–89)

## 2013-06-02 ENCOUNTER — Encounter: Payer: Self-pay | Admitting: Family Medicine

## 2013-06-02 ENCOUNTER — Ambulatory Visit (INDEPENDENT_AMBULATORY_CARE_PROVIDER_SITE_OTHER): Payer: Medicare Other | Admitting: Family Medicine

## 2013-06-02 VITALS — BP 142/76 | HR 70 | Temp 97.7°F | Ht 66.0 in | Wt 202.5 lb

## 2013-06-02 DIAGNOSIS — E785 Hyperlipidemia, unspecified: Secondary | ICD-10-CM | POA: Diagnosis not present

## 2013-06-02 DIAGNOSIS — I2510 Atherosclerotic heart disease of native coronary artery without angina pectoris: Secondary | ICD-10-CM | POA: Insufficient documentation

## 2013-06-02 DIAGNOSIS — J189 Pneumonia, unspecified organism: Secondary | ICD-10-CM

## 2013-06-02 DIAGNOSIS — I10 Essential (primary) hypertension: Secondary | ICD-10-CM

## 2013-06-02 DIAGNOSIS — J181 Lobar pneumonia, unspecified organism: Secondary | ICD-10-CM

## 2013-06-02 DIAGNOSIS — Z0000 Encounter for general adult medical examination without abnormal findings: Secondary | ICD-10-CM

## 2013-06-02 DIAGNOSIS — G2581 Restless legs syndrome: Secondary | ICD-10-CM

## 2013-06-02 DIAGNOSIS — Z23 Encounter for immunization: Secondary | ICD-10-CM | POA: Diagnosis not present

## 2013-06-02 DIAGNOSIS — Z86010 Personal history of colonic polyps: Secondary | ICD-10-CM

## 2013-06-02 MED ORDER — AMLODIPINE BESYLATE 5 MG PO TABS: 5.0000 mg | ORAL_TABLET | Freq: Every day | ORAL | Status: DC

## 2013-06-02 MED ORDER — GABAPENTIN 300 MG PO CAPS: 300.0000 mg | ORAL_CAPSULE | ORAL | Status: DC | PRN

## 2013-06-02 MED ORDER — LISINOPRIL 5 MG PO TABS: 5.0000 mg | ORAL_TABLET | Freq: Every day | ORAL | Status: DC

## 2013-06-02 NOTE — Addendum Note (Signed)
Addended by: Royann Shivers A on: 06/02/2013 05:05 PM   Modules accepted: Orders

## 2013-06-02 NOTE — Assessment & Plan Note (Signed)
Chronic, reviewed #s.  Increase fish oil to daily.

## 2013-06-02 NOTE — Progress Notes (Signed)
BP 142/76  Pulse 70  Temp(Src) 97.7 F (36.5 C) (Oral)  Ht 5\' 6"  (1.676 m)  Wt 202 lb 8 oz (91.853 kg)  BMI 32.70 kg/m2   CC: medicare wellness, initial  Subjective:    Patient ID: Justin Ali, male    DOB: 06/25/1940, 73 y.o.   MRN: [id number]  HPI: Justin Ali is a 73 y.o. male presenting on 06/02/2013 with Annual Exam  Finally recuperating from pneumonia - took him 2+ months to recover.  F/u CT lungs showing near complete resolution of LUL PNA along with post infections scarring.  Interested on [place].  CT scan also showed some plaqud buildup in carotid arteries.  He has been taking aspirin 81mg  daily.  Takes fish oil 1 pill MWF.  Has has still been taking calcium supplement although we advised he discontinue this last year.  Clarified this.  He does stay active - treadmill 30 min and bicycle 15 min 3-4 times week.  No chest pain, dyspnea symptoms.  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  Wt Readings from Last 3 Encounters:  06/02/13 202 lb 8 oz (91.853 kg)  03/18/13 201 lb (91.173 kg)  02/28/13 201 lb 12 oz (91.513 kg)   Body mass index is 32.7 kg/(m^2).  Caffeine: occasional coffee  Lives with wife, 1 cat  Occupation: truck Geophysicist/field seismologist (local) Activity: no regular exercise  Diet: rare water, some fruits/vegetables  Preventative:  Colonoscopy: 02/2013.  residual adenomas, cecal AVM, rec rpt 1 yr [name]). Prostate - gets checked yearly. No nocturia, no weakness of stream. No known fmhx prostate cancer. Flu shot - 01/2013  Td - 2012 at [place]  Pneumonia completed 2012  zostavax - insurance won't cover so not interested. Doesn't think has had chicken pox Advanced directives - has at home. Wife would be HCPOA. No desire for prolonged life support   No falls in last year. No anhedonia. Denies depressed. Hearing screen ok.  vision screen recently done with eye doctor 01/2013.  Sees yearly due to cataracts.  Declines vision screen today.  Relevant  past medical, surgical, family and social history reviewed and updated. Allergies and medications reviewed and updated. Current Outpatient Prescriptions on File Prior to Visit  Medication Sig  . aspirin EC 81 MG tablet Take 1 tablet (81 mg total) by mouth daily.  . cholecalciferol (VITAMIN D) 1000 UNITS tablet Take 2,000 Units by mouth daily.   . fish oil-omega-3 fatty acids 1000 MG capsule Take 1 g by mouth daily.   . Melatonin 10 MG CAPS Take 1 capsule by mouth as needed.  . Multiple Vitamins-Minerals (MULTIVITAMIN PO) Take 1 tablet by mouth daily.   No current facility-administered medications on file prior to visit.    Review of Systems  Constitutional: Negative for fever, chills, activity change, appetite change, fatigue and unexpected weight change (trying and losing weight).  HENT: Negative for hearing loss.   Eyes: Negative for visual disturbance.  Respiratory: Negative for cough, chest tightness, shortness of breath and wheezing.   Cardiovascular: Negative for chest pain, palpitations and leg swelling.  Gastrointestinal: Negative for nausea, vomiting, abdominal pain, diarrhea, constipation, blood in stool and abdominal distention.  Genitourinary: Negative for hematuria and difficulty urinating.  Musculoskeletal: Negative for arthralgias, myalgias and neck pain.  Skin: Negative for rash.  Neurological: Negative for dizziness, seizures, syncope and headaches.  Hematological: Negative for adenopathy. Does not bruise/bleed easily.  Psychiatric/Behavioral: Negative for dysphoric mood. The patient is not nervous/anxious.  Per HPI unless specifically indicated above     Objective:    BP 142/76  Pulse 70  Temp(Src) 97.7 F (36.5 C) (Oral)  Ht 5\' 6"  (1.676 m)  Wt 202 lb 8 oz (91.853 kg)  BMI 32.70 kg/m2  Physical Exam  Nursing note and vitals reviewed. Constitutional: He is oriented to person, place, and time. He appears well-developed and well-nourished. No distress.  HENT:   Head: Normocephalic and atraumatic.  Right Ear: Hearing, tympanic membrane, external ear and ear canal normal.  Left Ear: Hearing, tympanic membrane, external ear and ear canal normal.  Nose: Nose normal.  Mouth/Throat: Uvula is midline, oropharynx is clear and moist and mucous membranes are normal. No oropharyngeal exudate, posterior oropharyngeal edema or posterior oropharyngeal erythema.  Eyes: Conjunctivae and EOM are normal. Pupils are equal, round, and reactive to light. No scleral icterus.  Neck: Normal range of motion. Neck supple. Carotid bruit is not present. No thyromegaly present.  Cardiovascular: Normal rate, regular rhythm, normal heart sounds and intact distal pulses.   No murmur heard. Pulses:      Radial pulses are 2+ on the right side, and 2+ on the left side.  Pulmonary/Chest: Effort normal and breath sounds normal. No respiratory distress. He has no wheezes. He has no rales.  Abdominal: Soft. Bowel sounds are normal. He exhibits no distension and no mass. There is no tenderness. There is no rebound and no guarding.  Genitourinary: Rectum normal and prostate normal. Rectal exam shows no external hemorrhoid, no internal hemorrhoid, no fissure, no mass, no tenderness and anal tone normal. Prostate is not enlarged (20gm) and not tender.  Musculoskeletal: Normal range of motion. He exhibits no edema.  Lymphadenopathy:    He has no cervical adenopathy.  Neurological: He is alert and oriented to person, place, and time.  CN grossly intact, station and gait intact  Skin: Skin is warm and dry. No rash noted.  Psychiatric: He has a normal mood and affect. His behavior is normal. Judgment and thought content normal.   Results for orders placed in visit on 05/26/13  LIPID PANEL      Result Value Range   Cholesterol 133  0 - 200 mg/dL   Triglycerides 237.0 (*) 0.0 - 149.0 mg/dL   HDL 24.80 (*) >39.00 mg/dL   VLDL 47.4 (*) 0.0 - 40.0 mg/dL   Total CHOL/HDL Ratio 5    VITAMIN D  25 HYDROXY      Result Value Range   Vit D, 25-Hydroxy 94 (*) 30 - 89 ng/mL  PSA      Result Value Range   PSA 2.50  0.10 - 4.00 ng/mL  LDL CHOLESTEROL, DIRECT      Result Value Range   Direct LDL 67.9        Assessment & Plan:   Problem List Items Addressed This Visit   CAD (coronary artery disease), native coronary artery   Relevant Medications      amLODIpine (NORVASC) tablet      lisinopril (PRINIVIL,ZESTRIL) tablet   Dyslipidemia     Chronic, reviewed #s.  Increase fish oil to daily.    HYPERTENSION, BENIGN ESSENTIAL     Chronic, stable. Continue regimen.    Left upper lobe pneumonia     Seems fully resolved. Updated prevnar today.    Medicare annual wellness visit, initial - Primary     I have personally reviewed the Medicare Annual Wellness questionnaire and have noted 1. The patient's medical and social history 2.  Their use of alcohol, tobacco or illicit drugs 3. Their current medications and supplements 4. The patient's functional ability including ADL's, fall risks, home safety risks and hearing or visual impairment. 5. Diet and physical activity 6. Evidence for depression or mood disorders The patients weight, height, BMI have been recorded in the chart.  Hearing and vision has been addressed. I have made referrals, counseling and provided education to the patient based review of the above and I have provided the pt with a written personalized care plan for preventive services. See scanned questionairre. Advanced directives discussed: will bring [place] copy.  Reviewed preventative protocols and updated unless pt declined. prevnar today. DRE/PSA reassuring today.    Personal history of colonic adenomas     Followed closely by Dr. [name]. Appreciate his care of my patient.    RESTLESS LEG SYNDROME     Gabapentin PRN        Follow up plan: Return in about 1 year (around 06/02/2014), or as needed, for annual exam, prior fasting for blood work.

## 2013-06-02 NOTE — Assessment & Plan Note (Signed)
Gabapentin PRN

## 2013-06-02 NOTE — Assessment & Plan Note (Signed)
Followed closely by Dr. [name]. Appreciate his care of my patient.

## 2013-06-02 NOTE — Assessment & Plan Note (Signed)
Seems fully resolved. Updated prevnar today.

## 2013-06-02 NOTE — Assessment & Plan Note (Signed)
Chronic, stable. Continue regimen. 

## 2013-06-02 NOTE — Assessment & Plan Note (Signed)
I have personally reviewed the Medicare Annual Wellness questionnaire and have noted 1. The patient's medical and social history 2. Their use of alcohol, tobacco or illicit drugs 3. Their current medications and supplements 4. The patient's functional ability including ADL's, fall risks, home safety risks and hearing or visual impairment. 5. Diet and physical activity 6. Evidence for depression or mood disorders The patients weight, height, BMI have been recorded in the chart.  Hearing and vision has been addressed. I have made referrals, counseling and provided education to the patient based review of the above and I have provided the pt with a written personalized care plan for preventive services. See scanned questionairre. Advanced directives discussed: will bring [place] copy.  Reviewed preventative protocols and updated unless pt declined. prevnar today. DRE/PSA reassuring today.

## 2013-06-02 NOTE — Assessment & Plan Note (Signed)
Reviewed CT finding, recommended stop calcium.  Will increase fish oil to daily and reassess with FLP next year. Pt denies sxs currently, advised to seek ER care if chest pain, notify us if notices worsening DOE. Pt agrees.

## 2013-06-02 NOTE — Progress Notes (Signed)
Pre-visit discussion using our clinic review tool. No additional management support is needed unless otherwise documented below in the visit note.  

## 2013-06-02 NOTE — Patient Instructions (Addendum)
Prevnar (second pneumonia shot) today. Bring me a copy of your living will. Let's stop calcium. Take fish oil one a day. Take vitamin D 2000 units only. Good to see you today, call us with questions.

## 2013-06-03 ENCOUNTER — Telehealth: Payer: Self-pay | Admitting: Family Medicine

## 2013-06-03 NOTE — Telephone Encounter (Signed)
Relevant patient education mailed to patient.  

## 2013-12-05 ENCOUNTER — Ambulatory Visit (INDEPENDENT_AMBULATORY_CARE_PROVIDER_SITE_OTHER): Payer: Medicare Other | Admitting: Family Medicine

## 2013-12-05 ENCOUNTER — Encounter: Payer: Self-pay | Admitting: Family Medicine

## 2013-12-05 VITALS — BP 126/70 | HR 77 | Temp 98.3°F | Wt 204.5 lb

## 2013-12-05 DIAGNOSIS — T148XXA Other injury of unspecified body region, initial encounter: Secondary | ICD-10-CM | POA: Diagnosis not present

## 2013-12-05 DIAGNOSIS — IMO0001 Reserved for inherently not codable concepts without codable children: Secondary | ICD-10-CM | POA: Diagnosis not present

## 2013-12-05 DIAGNOSIS — W5501XA Bitten by cat, initial encounter: Secondary | ICD-10-CM

## 2013-12-05 DIAGNOSIS — I2510 Atherosclerotic heart disease of native coronary artery without angina pectoris: Secondary | ICD-10-CM

## 2013-12-05 MED ORDER — AMOXICILLIN-POT CLAVULANATE 875-125 MG PO TABS: 1.0000 | ORAL_TABLET | Freq: Two times a day (BID) | ORAL | Status: DC

## 2013-12-05 NOTE — Progress Notes (Signed)
Pre visit review using our clinic review tool, if applicable. No additional management support is needed unless otherwise documented below in the visit note.  Tetanus done <5 years ago.  His cat bit him.  DOI 11/30/13.  Cat is vaccinated, indoor cat, healthy.  Wasn't sore initially  No fevers.  No troubles except for recently with some redness.  R lower lateral shin. Using peroxide and neosporin.  Still not ttp.  No pain walking.  Still exercising at the gym w/o trouble.    Meds, vitals, and allergies reviewed.   ROS: See HPI.  Otherwise, noncontributory.  nad R leg with 5cm of redness on R lower lateral shin, ~1cm central scabbing.   No fluctuance.  Not ttp

## 2013-12-05 NOTE — Patient Instructions (Signed)
Start augmentin twice a day and update [place] in about 1-2 days, sooner if needed.   The redness should gradually get better.

## 2013-12-06 DIAGNOSIS — W5501XA Bitten by cat, initial encounter: Secondary | ICD-10-CM | POA: Insufficient documentation

## 2013-12-06 NOTE — Assessment & Plan Note (Signed)
Nontoxic, start augmentin, f/u prn.  Call back with update.  He agrees. No sign of abscess.

## 2013-12-08 ENCOUNTER — Telehealth: Payer: Self-pay | Admitting: Family Medicine

## 2013-12-08 NOTE — Telephone Encounter (Signed)
Pt came in for cat bite. Pt states bite above ankle is getting wider (3-4' wide) and more red. Pt temp is now 98.4. Pt said he threw up 3 times yesterday and doesn't know if that is from the abx. Please advise.

## 2013-12-08 NOTE — Telephone Encounter (Signed)
Pt scheduled with Dr. Damita Dunnings tomorrow 8:15am. Pt did not want to go to Urgent Care or wait for pm appt with Dr. Darnell Level tomorrow. FYI: pt stated he has not thrown up anymore today.

## 2013-12-08 NOTE — Telephone Encounter (Signed)
Need recheck with MD

## 2013-12-08 NOTE — Telephone Encounter (Signed)
Noted, thanks!

## 2013-12-09 ENCOUNTER — Encounter: Payer: Self-pay | Admitting: Family Medicine

## 2013-12-09 ENCOUNTER — Ambulatory Visit (INDEPENDENT_AMBULATORY_CARE_PROVIDER_SITE_OTHER): Payer: Medicare Other | Admitting: Family Medicine

## 2013-12-09 VITALS — BP 118/64 | HR 66 | Temp 97.9°F | Wt 202.2 lb

## 2013-12-09 DIAGNOSIS — W5501XD Bitten by cat, subsequent encounter: Secondary | ICD-10-CM

## 2013-12-09 DIAGNOSIS — T148XXA Other injury of unspecified body region, initial encounter: Secondary | ICD-10-CM

## 2013-12-09 DIAGNOSIS — I2510 Atherosclerotic heart disease of native coronary artery without angina pectoris: Secondary | ICD-10-CM | POA: Diagnosis not present

## 2013-12-09 DIAGNOSIS — Z5189 Encounter for other specified aftercare: Secondary | ICD-10-CM | POA: Diagnosis not present

## 2013-12-09 MED ORDER — DOXYCYCLINE HYCLATE 100 MG PO TABS: 100.0000 mg | ORAL_TABLET | Freq: Two times a day (BID) | ORAL | Status: DC

## 2013-12-09 NOTE — Assessment & Plan Note (Signed)
Not clearly worse, but not a lot better.  D/w pt and PCP.  Would double cover, add on doxy in meantime.  Routine cautions given.  Call back Monday.  Okay for outpatient f/u.  Still no need for I&D.

## 2013-12-09 NOTE — Progress Notes (Signed)
Pre visit review using our clinic review tool, if applicable. No additional management support is needed unless otherwise documented below in the visit note.  Recheck cat bite.    He had some episodes of vomiting, but that has resolved.  Able to eat now.  Still on abx.   Central scabbing is larger now.  No fevers.  The area isn't sore.  "It looks bad but it doesn't hurt."  Not draining.  No other new lesions.    Meds, vitals, and allergies reviewed.   ROS: See HPI.  Otherwise, noncontributory.  R lower lateral shin with lesion similar to prev but central scabbing is larger.  No fluctuant mass.   Peripheral redness doesn't appear smaller but may be slightly fainted.  Not ttp

## 2013-12-09 NOTE — Patient Instructions (Signed)
Add on doxycycline, continue agumentin.  Be careful about sun exposure.  Update [place] Monday.  Take care.

## 2014-01-06 DIAGNOSIS — Z23 Encounter for immunization: Secondary | ICD-10-CM | POA: Diagnosis not present

## 2014-01-25 ENCOUNTER — Encounter: Payer: Self-pay | Admitting: Internal Medicine

## 2014-02-02 DIAGNOSIS — B300 Keratoconjunctivitis due to adenovirus: Secondary | ICD-10-CM | POA: Diagnosis not present

## 2014-02-17 DIAGNOSIS — H0259 Other disorders affecting eyelid function: Secondary | ICD-10-CM | POA: Diagnosis not present

## 2014-03-07 ENCOUNTER — Ambulatory Visit (AMBULATORY_SURGERY_CENTER): Payer: Self-pay | Admitting: *Deleted

## 2014-03-07 VITALS — Ht 67.0 in | Wt 205.6 lb

## 2014-03-07 DIAGNOSIS — Z86010 Personal history of colonic polyps: Secondary | ICD-10-CM

## 2014-03-07 NOTE — Progress Notes (Signed)
No egg or soy allergy. ewm No home 02 use. ewm No blood thinners, no diet pills. ewm No problems with past sedation. ewm

## 2014-03-22 ENCOUNTER — Ambulatory Visit (AMBULATORY_SURGERY_CENTER): Payer: Medicare Other | Admitting: Internal Medicine

## 2014-03-22 ENCOUNTER — Encounter: Payer: Self-pay | Admitting: Internal Medicine

## 2014-03-22 VITALS — BP 130/49 | HR 67 | Temp 96.7°F | Resp 17 | Ht 67.0 in | Wt 205.0 lb

## 2014-03-22 DIAGNOSIS — Z86010 Personal history of colonic polyps: Secondary | ICD-10-CM | POA: Diagnosis not present

## 2014-03-22 DIAGNOSIS — Q2733 Arteriovenous malformation of digestive system vessel: Secondary | ICD-10-CM | POA: Diagnosis not present

## 2014-03-22 DIAGNOSIS — D122 Benign neoplasm of ascending colon: Secondary | ICD-10-CM | POA: Diagnosis not present

## 2014-03-22 DIAGNOSIS — I2510 Atherosclerotic heart disease of native coronary artery without angina pectoris: Secondary | ICD-10-CM | POA: Diagnosis not present

## 2014-03-22 DIAGNOSIS — G4733 Obstructive sleep apnea (adult) (pediatric): Secondary | ICD-10-CM | POA: Diagnosis not present

## 2014-03-22 DIAGNOSIS — Z1211 Encounter for screening for malignant neoplasm of colon: Secondary | ICD-10-CM | POA: Diagnosis not present

## 2014-03-22 MED ORDER — SODIUM CHLORIDE 0.9 % IV SOLN: 500.0000 mL | INTRAVENOUS | Status: DC

## 2014-03-22 NOTE — Progress Notes (Signed)
Called to room to assist during endoscopic procedure.  Patient ID and intended procedure confirmed with present staff. Received instructions for my participation in the procedure from the performing physician.  

## 2014-03-22 NOTE — Op Note (Signed)
[facility]  [address] [place], [id number]   COLONOSCOPY PROCEDURE REPORT  PATIENT: Justin Ali, Justin Ali  MR#: [id number] BIRTHDATE: 04-20-1941 , 73  yrs. old GENDER: male ENDOSCOPIST: Gatha Mayer, MD, [facility] PROCEDURE DATE:  03/22/2014 PROCEDURE:   Colonoscopy with biopsy First Screening Colonoscopy - Avg.  risk and is 50 yrs.  old or older - No.  Prior Negative Screening - Now for repeat screening. N/A  History of Adenoma - Now for follow-up colonoscopy & has been > or = to 3 yrs.  No.  It has been less than 3 yrs since last colonoscopy.  Medical reason.  Polyps Removed Today? No.  Polyps Removed Today? No.  Recommend repeat exam, <10 yrs? Polyps Removed Today? No.  Recommend repeat exam, <10 yrs? Yes.  Polyps Removed Today? No.  Recommend repeat exam, <10 yrs? Yes.  High risk (family or personal hx). ASA CLASS:   Class III INDICATIONS:close f/u large sessile polypectomy 2014. MEDICATIONS: Propofol 200 mg IV and Monitored anesthesia care  DESCRIPTION OF PROCEDURE:   After the risks benefits and alternatives of the procedure were thoroughly explained, informed consent was obtained.  The digital rectal exam revealed no abnormalities of the rectum.   The LB [id number]  endoscope was introduced through the anus and advanced to the cecum, which was identified by both the appendix and ileocecal valve. No adverse events experienced.   The quality of the prep was excellent, using MiraLax  The instrument was then slowly withdrawn as the colon was fully examined.      COLON FINDINGS: 1) Polypectomy site in ascending colon with tattoo, no obvious polyp, biopsies taken.  2) Small non-bleeding cecal AVM 3).  Otherwise normal colon and rectum. Retroflexed views revealed no abnormalities. The time to cecum=2 minutes 14 seconds. Withdrawal time=13 minutes 36 seconds.  The scope was withdrawn and the procedure completed. COMPLICATIONS: There were no immediate  complications.  ENDOSCOPIC IMPRESSION: 1) Polypectomy site in ascending colon with tattoo, no obvious polyp, biopsies taken. 2) Small non-bleeding cecal AVM 3) Otherwise normal, excellent prep  RECOMMENDATIONS: Await biopsy results  eSigned:  Gatha Mayer, MD, [facility] 03/22/2014 10:30 AM   cc: The Patient

## 2014-03-22 NOTE — Patient Instructions (Addendum)
No signs of polyps this time. I did take some biopsies to make sure the polyp was gone and will let you know. Hoping to say come back in 3 years.  I appreciate the opportunity to care for you. Gatha Mayer, MD, FACG   YOU HAD AN ENDOSCOPIC PROCEDURE TODAY AT [facility]: Refer to the procedure report that was given to you for any specific questions about what was found during the examination.  If the procedure report does not answer your questions, please call your gastroenterologist to clarify.  If you requested that your care partner not be given the details of your procedure findings, then the procedure report has been included in a sealed envelope for you to review at your convenience later.  YOU SHOULD EXPECT: Some feelings of bloating in the abdomen. Passage of more gas than usual.  Walking can help get rid of the air that was put into your GI tract during the procedure and reduce the bloating. If you had a lower endoscopy (such as a colonoscopy or flexible sigmoidoscopy) you may notice spotting of blood in your stool or on the toilet paper. If you underwent a bowel prep for your procedure, then you may not have a normal bowel movement for a few days.  DIET: Your first meal following the procedure should be a light meal and then it is ok to progress to your normal diet.  A half-sandwich or bowl of soup is an example of a good first meal.  Heavy or fried foods are harder to digest and may make you feel nauseous or bloated.  Likewise meals heavy in dairy and vegetables can cause extra gas to form and this can also increase the bloating.  Drink plenty of fluids but you should avoid alcoholic beverages for 24 hours.  ACTIVITY: Your care partner should take you home directly after the procedure.  You should plan to take it easy, moving slowly for the rest of the day.  You can resume normal activity the day after the procedure however you should NOT DRIVE or use heavy  machinery for 24 hours (because of the sedation medicines used during the test).    SYMPTOMS TO REPORT IMMEDIATELY: A gastroenterologist can be reached at any hour.  During normal business hours, 8:30 AM to 5:00 PM Monday through Friday, call [phone].  After hours and on weekends, please call the GI answering service at [phone] who will take a message and have the physician on call contact you.   Following lower endoscopy (colonoscopy or flexible sigmoidoscopy):  Excessive amounts of blood in the stool  Significant tenderness or worsening of abdominal pains  Swelling of the abdomen that is new, acute  Fever of 100F or higher  Following upper endoscopy (EGD)  Vomiting of blood or coffee ground material  New chest pain or pain under the shoulder blades  Painful or persistently difficult swallowing  New shortness of breath  Fever of 100F or higher  Black, tarry-looking stools  FOLLOW UP: If any biopsies were taken you will be contacted by phone or by letter within the next 1-3 weeks.  Call your gastroenterologist if you have not heard about the biopsies in 3 weeks.  Our staff will call the home number listed on your records the next business day following your procedure to check on you and address any questions or concerns that you may have at that time regarding the information given to you following your procedure. This  is a courtesy call and so if there is no answer at the home number and we have not heard from you through the emergency physician on call, we will assume that you have returned to your regular daily activities without incident.  SIGNATURES/CONFIDENTIALITY: You and/or your care partner have signed paperwork which will be entered into your electronic medical record.  These signatures attest to the fact that that the information above on your After Visit Summary has been reviewed and is understood.  Full responsibility of the confidentiality of this discharge  information lies with you and/or your care-partner.

## 2014-03-22 NOTE — Progress Notes (Signed)
A/ox3 pleased with MAC, report to Robbin RN 

## 2014-03-27 ENCOUNTER — Telehealth: Payer: Self-pay | Admitting: *Deleted

## 2014-03-27 NOTE — Telephone Encounter (Signed)
  Follow up Call-  Call back number 03/22/2014 03/18/2013 08/02/2012  Post procedure Call Back phone  # [phone]  Permission to leave phone message Yes Yes Yes     Patient questions:  Do you have a fever, pain , or abdominal swelling? No. Pain Score  0 *  Have you tolerated food without any problems? Yes.    Have you been able to return to your normal activities? Yes.    Do you have any questions about your discharge instructions: Diet   No. Medications  No. Follow up visit  No.  Do you have questions or concerns about your Care? No.  Actions: * If pain score is 4 or above: No action needed, pain <4.

## 2014-03-28 HISTORY — PX: COLONOSCOPY: SHX174

## 2014-03-30 ENCOUNTER — Encounter: Payer: Self-pay | Admitting: Internal Medicine

## 2014-03-30 NOTE — Progress Notes (Signed)
Quick Note:  Negative for polyp tissue Repeat colonoscopy 2018-19 ______

## 2014-04-04 ENCOUNTER — Encounter: Payer: Self-pay | Admitting: Family Medicine

## 2014-04-10 DIAGNOSIS — H2513 Age-related nuclear cataract, bilateral: Secondary | ICD-10-CM | POA: Diagnosis not present

## 2014-05-29 ENCOUNTER — Other Ambulatory Visit: Payer: Self-pay | Admitting: Family Medicine

## 2014-05-29 ENCOUNTER — Other Ambulatory Visit (INDEPENDENT_AMBULATORY_CARE_PROVIDER_SITE_OTHER): Payer: Medicare Other

## 2014-05-29 DIAGNOSIS — I10 Essential (primary) hypertension: Secondary | ICD-10-CM | POA: Diagnosis not present

## 2014-05-29 DIAGNOSIS — Z125 Encounter for screening for malignant neoplasm of prostate: Secondary | ICD-10-CM | POA: Diagnosis not present

## 2014-05-29 DIAGNOSIS — E559 Vitamin D deficiency, unspecified: Secondary | ICD-10-CM

## 2014-05-29 DIAGNOSIS — K760 Fatty (change of) liver, not elsewhere classified: Secondary | ICD-10-CM

## 2014-05-29 DIAGNOSIS — E785 Hyperlipidemia, unspecified: Secondary | ICD-10-CM

## 2014-05-30 LAB — BASIC METABOLIC PANEL
BUN: 12 mg/dL (ref 6–23)
CALCIUM: 9.3 mg/dL (ref 8.4–10.5)
CO2: 31 mEq/L (ref 19–32)
CREATININE: 1.12 mg/dL (ref 0.40–1.50)
Chloride: 107 mEq/L (ref 96–112)
GFR: 68.4 mL/min (ref >60.00)
Glucose, Bld: 79 mg/dL (ref 70–99)
Potassium: 3.9 mEq/L (ref 3.5–5.1)
SODIUM: 142 meq/L (ref 135–145)

## 2014-05-30 LAB — VITAMIN D 25 HYDROXY (VIT D DEFICIENCY, FRACTURES): VITD: 63.05 ng/mL (ref 30.00–100.00)

## 2014-05-30 LAB — LIPID PANEL
CHOL/HDL RATIO: 5
Cholesterol: 142 mg/dL (ref 0–200)
HDL: 26.8 mg/dL — ABNORMAL LOW (ref >39.00)
NonHDL: 115.2
Triglycerides: 292 mg/dL — ABNORMAL HIGH (ref 0.0–149.0)
VLDL: 58.4 mg/dL — AB (ref 0.0–40.0)

## 2014-05-30 LAB — HEPATIC FUNCTION PANEL
ALT: 38 U/L (ref 0–53)
AST: 29 U/L (ref 0–37)
Albumin: 4.1 g/dL (ref 3.5–5.2)
Alkaline Phosphatase: 111 U/L (ref 39–117)
BILIRUBIN DIRECT: 0.1 mg/dL (ref 0.0–0.3)
Total Bilirubin: 0.7 mg/dL (ref 0.2–1.2)
Total Protein: 6.3 g/dL (ref 6.0–8.3)

## 2014-05-30 LAB — LDL CHOLESTEROL, DIRECT: Direct LDL: 77 mg/dL

## 2014-05-30 LAB — PSA, MEDICARE: PSA: 1.34 ng/mL (ref 0.10–4.00)

## 2014-06-06 ENCOUNTER — Encounter: Payer: Medicare Other | Admitting: Family Medicine

## 2014-06-07 ENCOUNTER — Ambulatory Visit (INDEPENDENT_AMBULATORY_CARE_PROVIDER_SITE_OTHER): Payer: Medicare Other | Admitting: Family Medicine

## 2014-06-07 ENCOUNTER — Encounter: Payer: Self-pay | Admitting: Family Medicine

## 2014-06-07 VITALS — BP 136/68 | HR 80 | Temp 98.1°F | Ht 66.0 in | Wt 208.8 lb

## 2014-06-07 DIAGNOSIS — H6121 Impacted cerumen, right ear: Secondary | ICD-10-CM

## 2014-06-07 DIAGNOSIS — G2581 Restless legs syndrome: Secondary | ICD-10-CM

## 2014-06-07 DIAGNOSIS — Z0000 Encounter for general adult medical examination without abnormal findings: Secondary | ICD-10-CM

## 2014-06-07 DIAGNOSIS — H6120 Impacted cerumen, unspecified ear: Secondary | ICD-10-CM | POA: Insufficient documentation

## 2014-06-07 DIAGNOSIS — I2510 Atherosclerotic heart disease of native coronary artery without angina pectoris: Secondary | ICD-10-CM

## 2014-06-07 DIAGNOSIS — E669 Obesity, unspecified: Secondary | ICD-10-CM

## 2014-06-07 DIAGNOSIS — E663 Overweight: Secondary | ICD-10-CM | POA: Insufficient documentation

## 2014-06-07 DIAGNOSIS — K760 Fatty (change of) liver, not elsewhere classified: Secondary | ICD-10-CM

## 2014-06-07 DIAGNOSIS — Z7189 Other specified counseling: Secondary | ICD-10-CM

## 2014-06-07 DIAGNOSIS — I10 Essential (primary) hypertension: Secondary | ICD-10-CM

## 2014-06-07 DIAGNOSIS — R634 Abnormal weight loss: Secondary | ICD-10-CM | POA: Insufficient documentation

## 2014-06-07 DIAGNOSIS — E785 Hyperlipidemia, unspecified: Secondary | ICD-10-CM

## 2014-06-07 MED ORDER — LISINOPRIL 5 MG PO TABS: 5.0000 mg | ORAL_TABLET | Freq: Every day | ORAL | Status: DC

## 2014-06-07 MED ORDER — AMLODIPINE BESYLATE 5 MG PO TABS: 5.0000 mg | ORAL_TABLET | Freq: Every day | ORAL | Status: DC

## 2014-06-07 MED ORDER — GABAPENTIN 300 MG PO CAPS: 300.0000 mg | ORAL_CAPSULE | Freq: Every day | ORAL | Status: DC

## 2014-06-07 NOTE — Progress Notes (Signed)
Pre visit review using our clinic review tool, if applicable. No additional management support is needed unless otherwise documented below in the visit note. 

## 2014-06-07 NOTE — Assessment & Plan Note (Signed)
Chronic, stable on gabapentin.

## 2014-06-07 NOTE — Progress Notes (Signed)
BP 136/68 mmHg  Pulse 80  Temp(Src) 98.1 F (36.7 C) (Oral)  Ht 5\' 6"  (1.676 m)  Wt 208 lb 12 oz (94.688 kg)  BMI 33.71 kg/m2   CC: medicare wellness visit  Subjective:    Patient ID: Justin Ali, male    DOB: 1940-07-19, 74 y.o.   MRN: [id number]  HPI: Justin Ali is a 74 y.o. male presenting on 06/07/2014 for Annual Exam   Asks about bariatric meds. Goes to gym MWF, walks 2 miles, rides bike 7 miles, lifts weights. No weight loss noted.   No falls in last year. No anhedonia. Denies depressed. Hearing screen failed R ear.  vision screen recently done with eye doctor 02/2014.   Preventative: COLONOSCOPY Date: 03/2014 no residual polyp, cecal AVM, rpt 3-4 yrs [name]) Prostate - gets checked yearly. No nocturia, no weakness of stream. No known fmhx prostate cancer.  Flu shot - 12/2013 Td - 2012 at [facility]  Pneumonia completed 2012  zostavax - insurance won't cover so not interested. Doesn't think has had chicken pox.  Advanced directives - has at home. Wife would be HCPOA. No desire for prolonged life support. Will bring me copy  Caffeine: occasional coffee  Lives with wife, 1 cat  Occupation: truck driver Adult nurse) Activity: goes to [employer] 3x/wk Diet: rare water, lots of sweet tea, some fruits/vegetables  Relevant past medical, surgical, family and social history reviewed and updated as indicated. Interim medical history since our last visit reviewed. Allergies and medications reviewed and updated. Current Outpatient Prescriptions on File Prior to Visit  Medication Sig  . aspirin EC 81 MG tablet Take 1 tablet (81 mg total) by mouth daily.  . cholecalciferol (VITAMIN D) 1000 UNITS tablet Take 2,000 Units by mouth daily.   . fish oil-omega-3 fatty acids 1000 MG capsule Take 1 g by mouth daily.   . Melatonin 10 MG CAPS Take 1 capsule by mouth as needed.  . Multiple Vitamins-Minerals (MULTIVITAMIN PO) Take 1 tablet by mouth daily.   No current  facility-administered medications on file prior to visit.    Review of Systems Per HPI unless specifically indicated above     Objective:    BP 136/68 mmHg  Pulse 80  Temp(Src) 98.1 F (36.7 C) (Oral)  Ht 5\' 6"  (1.676 m)  Wt 208 lb 12 oz (94.688 kg)  BMI 33.71 kg/m2  Wt Readings from Last 3 Encounters:  06/07/14 208 lb 12 oz (94.688 kg)  03/22/14 205 lb (92.987 kg)  03/07/14 205 lb 9.6 oz (93.26 kg)    Physical Exam  Constitutional: He is oriented to person, place, and time. He appears well-developed and well-nourished. No distress.  HENT:  Head: Normocephalic and atraumatic.  Right Ear: Hearing, external ear and ear canal normal.  Left Ear: Hearing, tympanic membrane, external ear and ear canal normal.  Nose: Nose normal.  Mouth/Throat: Uvula is midline, oropharynx is clear and moist and mucous membranes are normal. No oropharyngeal exudate, posterior oropharyngeal edema or posterior oropharyngeal erythema.  R cerumen impaction removed with plastic curette and alligator forceps  Eyes: Conjunctivae and EOM are normal. Pupils are equal, round, and reactive to light. No scleral icterus.  Neck: Normal range of motion. Neck supple. Carotid bruit is not present. No thyromegaly present.  Cardiovascular: Normal rate, regular rhythm, normal heart sounds and intact distal pulses.   No murmur heard. Pulses:      Radial pulses are 2+ on the right side, and 2+ on the left  side.  Pulmonary/Chest: Effort normal and breath sounds normal. No respiratory distress. He has no wheezes. He has no rales.  Abdominal: Soft. Bowel sounds are normal. He exhibits distension (mild). He exhibits no mass. There is no hepatosplenomegaly. There is no tenderness. There is no rebound and no guarding.  Genitourinary: Rectum normal and prostate normal. Rectal exam shows no external hemorrhoid, no internal hemorrhoid, no fissure, no mass, no tenderness and anal tone normal. Prostate is not enlarged (20-25gm) and  not tender.  Musculoskeletal: Normal range of motion. He exhibits no edema.  Lymphadenopathy:    He has no cervical adenopathy.  Neurological: He is alert and oriented to person, place, and time.  CN grossly intact, station and gait intact Recall 3/3 Calculation 2/5 L-O-R-W-D 4/5 serial 3s  Skin: Skin is warm and dry. No rash noted.  Psychiatric: He has a normal mood and affect. His behavior is normal. Judgment and thought content normal.  Nursing note and vitals reviewed.  Results for orders placed or performed in visit on 05/29/14  Lipid panel  Result Value Ref Range   Cholesterol 142 0 - 200 mg/dL   Triglycerides 292.0 (H) 0.0 - 149.0 mg/dL   HDL 26.80 (L) >39.00 mg/dL   VLDL 58.4 (H) 0.0 - 40.0 mg/dL   Total CHOL/HDL Ratio 5    NonHDL 400.86   Basic metabolic panel  Result Value Ref Range   Sodium 142 135 - 145 mEq/L   Potassium 3.9 3.5 - 5.1 mEq/L   Chloride 107 96 - 112 mEq/L   CO2 31 19 - 32 mEq/L   Glucose, Bld 79 70 - 99 mg/dL   BUN 12 6 - 23 mg/dL   Creatinine, Ser 1.12 0.40 - 1.50 mg/dL   Calcium 9.3 8.4 - 10.5 mg/dL   GFR 68.40 >60.00 mL/min  Vit D  25 hydroxy (rtn osteoporosis monitoring)  Result Value Ref Range   VITD 63.05 30.00 - 100.00 ng/mL  Hepatic Function Panel  Result Value Ref Range   Total Bilirubin 0.7 0.2 - 1.2 mg/dL   Bilirubin, Direct 0.1 0.0 - 0.3 mg/dL   Alkaline Phosphatase 111 39 - 117 U/L   AST 29 0 - 37 U/L   ALT 38 0 - 53 U/L   Total Protein 6.3 6.0 - 8.3 g/dL   Albumin 4.1 3.5 - 5.2 g/dL  PSA, Medicare  Result Value Ref Range   PSA 1.34 0.10 - 4.00 ng/ml  LDL cholesterol, direct  Result Value Ref Range   Direct LDL 77.0 mg/dL      Assessment & Plan:   Problem List Items Addressed This Visit    RESTLESS LEG SYNDROME    Chronic, stable on gabapentin.      Obesity    Reviewed healthy diet/lifestyle changes to affect sustainable weight loss      Medicare annual wellness visit, subsequent - Primary    I have personally  reviewed the Medicare Annual Wellness questionnaire and have noted 1. The patient's medical and social history 2. Their use of alcohol, tobacco or illicit drugs 3. Their current medications and supplements 4. The patient's functional ability including ADL's, fall risks, home safety risks and hearing or visual impairment. 5. Diet and physical activity 6. Evidence for depression or mood disorders The patients weight, height, BMI have been recorded in the chart.  Hearing and vision has been addressed. I have made referrals, counseling and provided education to the patient based review of the above and I have provided the pt  with a written personalized care plan for preventive services. Provider list updated - see scanned questionairre. Reviewed preventative protocols and updated unless pt declined.       HYPERTENSION, BENIGN ESSENTIAL    Chronic, stable. Continue regimen.      Relevant Medications   amLODIpine (NORVASC) tablet   lisinopril (PRINIVIL,ZESTRIL) tablet   Hearing loss due to cerumen impaction    Plastic curette and alligator forceps used to clear impaction.      Fatty liver   Dyslipidemia    Chronic, reviewed #s. rec decreased added sugars. Doesn't like fish so will continue fish oil.      CAD (coronary artery disease), native coronary artery    Continue aspirin and other medical management of comorbidities (HLD, HTN, weight).      Relevant Medications   amLODIpine (NORVASC) tablet   lisinopril (PRINIVIL,ZESTRIL) tablet   Advanced care planning/counseling discussion    Advanced directives - has at home. Wife would be HCPOA. No desire for prolonged life support. Will bring me copy          Follow up plan: Return in about 1 year (around 06/08/2015), or as needed, for medicare wellness visit.

## 2014-06-07 NOTE — Assessment & Plan Note (Signed)
Chronic, stable. Continue regimen. 

## 2014-06-07 NOTE — Assessment & Plan Note (Signed)
Plastic curette and alligator forceps used to clear impaction.

## 2014-06-07 NOTE — Assessment & Plan Note (Signed)
Continue aspirin and other medical management of comorbidities (HLD, HTN, weight).

## 2014-06-07 NOTE — Assessment & Plan Note (Addendum)
Reviewed healthy diet/lifestyle changes to affect sustainable weight loss

## 2014-06-07 NOTE — Assessment & Plan Note (Signed)
Chronic, reviewed #s. rec decreased added sugars. Doesn't like fish so will continue fish oil.

## 2014-06-07 NOTE — Assessment & Plan Note (Signed)

## 2014-06-07 NOTE — Patient Instructions (Addendum)
Watch sugared drinks to lower cholesterol levels. Sugar levels are doing well. Bring [place] a copy of your living will and health care power of attorney. Good to see you today, call us with questions. Return as needed or in 1 year for next medicare wellness visit

## 2014-06-07 NOTE — Assessment & Plan Note (Signed)
Advanced directives - has at home. Wife would be HCPOA. No desire for prolonged life support. Will bring me copy

## 2014-08-18 NOTE — H&P (Signed)
PATIENT NAME:  Justin Ali, Justin Ali MR#:  [id number] DATE OF BIRTH:  10/03/1940  DATE OF ADMISSION:  02/15/2013  PRIMARY CARE PHYSICIAN: Ria Bush, MD   CHIEF COMPLAINT: Nausea, vomiting and diarrhea for 2 days associated with shortness of breath.   HISTORY OF PRESENT ILLNESS: The patient is a 74 year old male with past medical history of hypertension, who is presenting to the ER with a chief complaint of intractable nausea, vomiting and diarrhea for the past 2 days. Denies any hematemesis or hematochezia. The patient's vomiting and diarrhea completely stopped yesterday. Denies any abdominal pain. The patient started experiencing shortness of breath which was gradual in onset. The patient also complaining of ankle pain radiating to the legs and abdomen. In the ER, the patient's x-ray has revealed developing pneumonia. Magnesium is low at 1.6. He has received 2 grams of magnesium sulfate in the ER, and now the patient is started on antibiotics for developing pneumonia. Hospitalist team is called to admit the patient.  PAST MEDICAL HISTORY: Hypertension.   PAST SURGICAL HISTORY: Colonoscopy, had polyps which was removed.   ALLERGIES: He has no known drug allergies.   PSYCHOSOCIAL HISTORY: Lives at home with wife. No history of smoking, alcohol or illicit drug usage.   FAMILY HISTORY: Mother deceased with breast cancer.  REVIEW OF SYSTEMS: CONSTITUTIONAL: Denies any fatigue. Complaining of weakness.  EYES: Denies blurry vision, glaucoma.  EARS, NOSE, THROAT: Denies epistaxis or discharge.  RESPIRATION: Complaining of intermittent episodes of cough. No COPD.  CARDIOVASCULAR: No chest pain, palpitations. GASTROINTESTINAL: Had nausea, vomiting and diarrhea, which has resolved. No abdominal pain.  GENITOURINARY: No dysuria or hematuria.  ENDOCRINE: Denies polyuria, nocturia or thyroid problems.  HEMATOLOGIC AND LYMPHATIC: No anemia or easy bruising or bleeding.  INTEGUMENTARY: No acne, rash,  lesions.  MUSCULOSKELETAL: No joint pain in the neck or back. Denies any shoulder pain. The patient is complaining of muscle aches which are diffuse.  NEUROLOGIC: No vertigo, ataxia.  PSYCHIATRIC: Normal mood and affect.   PHYSICAL EXAM: GENERAL APPEARANCE: Not under acute distress. Moderately built and nourished HEENT : Normocephalic, atraumatic, PERLA;Neck is supple ,no JVD LUNGS: Positive crackels. No accessory muscle usage CARDIO: S1 S2 nml, regular rate and rhythm GASTRO:soft, bowel sounds are positive in all four quadrants, non tender, non distended. Nomasses felt. NEURO: Awake,alert and oriented  to place,person and time. Motor and sensory are grossly intact EXTREMITIES: No edema, no cyanosis and clubbing SKIN: No rashes, lesions. Warm to touch with normal turgour   LABORATORY AND IMAGING STUDIES: Glucose 110, BUN 16, creatinine 1.55, sodium 138, potassium 4.3, chloride 106, CO2 27, GFR 51, anion gap is 5, serum osmolality 278, calcium 9.3, magnesium 1.6. LFTs are normal. WBC is normal except white count which is elevated at 13.8. Urinalysis: Ketones are negative. Leukocyte esterase and nitrite are negative. A 12-lead EKG has revealed sinus tachycardia at 104 beats per minute, normal PR and QRS intervals. No acute ST-T wave changes. Chest x-ray has revealed a developing right basilar pneumonia.   ASSESSMENT AND PLAN: A 74 year old male comes into the Emergency Room with chief complaint of nausea, vomiting, diarrhea, which has resolved now, but complaining of shortness of breath associated with cough. Will be admitted with the following assessment and plan.   1. Atypical pneumonia, probably community acquired. Will get a sputum culture and sensitivity. Will provide the patient IV Rocephin and Zithromax. 2. Hypomagnesemia. Will replace and check a.m. labs, including BMP and magnesium.  3. Hypertension. Blood pressure is stable. Will resume  his home medication.  4. Acute gastroenteritis,  probably viral, which has resolved now. If the patient starts developing diarrhea, will check the stool.  5. Will provide gastrointestinal and deep vein thrombosis prophylaxis.   Diagnosis and plan of care were discussed in detail with the patient. He verbalized understanding of the plan.   CODE STATUS: He is full code. Wife is the medical power of attorney.   TOTAL TIME SPENT ON THE ADMISSION: 45 minutes.   ____________________________ Nicholes Mango, MD ag:lb D: 02/15/2013 04:20:37 ET T: 02/15/2013 05:43:24 ET JOB#: [id number]  cc: Nicholes Mango, MD, <Dictator> Ria Bush, MD Nicholes Mango MD ELECTRONICALLY SIGNED 03/03/2013 5:07

## 2014-08-18 NOTE — Discharge Summary (Signed)
PATIENT NAME:  Justin Ali, Justin Ali MR#:  [id number] DATE OF BIRTH:  04-Sep-1940  DATE OF ADMISSION:  02/15/2013 DATE OF DISCHARGE:  02/20/2013  PRIMARY CARE PHYSICIAN: Dr. Ria Bush.    CHIEF COMPLAINT: Nausea, vomiting, shortness of breath.   DISCHARGE DIAGNOSES:  1. Severe sepsis and acute respiratory failure secondary to pneumonia.  2. Hypomagnesemia.  3. 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  4. Hypertension.   DISCHARGE MEDICATIONS: Amlodipine 5 mg daily, lisinopril 5 mg daily, aspirin 81 mg daily, fish oil oral capsule 1 tab once a day, calcium 600 plus D 1 tab once a day, gabapentin 300 mg once a day at bedtime, dextromethorphan/guaifenesin 10 mL every 8 hours for 5 days, Vantin 200 mg 1 tab every 12 hours for 5 days.   DIET: Low sodium.   ACTIVITY: As tolerated.   FOLLOWUP: Please follow with PCP within 1 to 2 weeks. Please check an x-ray of the chest in 4 to 6 weeks for resolution.   DISPOSITION: Home.   CODE STATUS: FULL CODE.   SIGNIFICANT LABS AND IMAGING: Initial BUN 16, creatinine 1.55, last creatinine of 1.6 on October 22nd. Initial LFTs showed AST of 12, albumin of 3.3, otherwise within normal limits. Initial magnesium was 1.6. Potassium 4.3. Initial white blood cell count of 13.8, last one of 8.9 as of October 22nd. TSH was 1.27. Urine cultures showed mixed flora. Sputum culture collected on October 24th no growth. Initial UA no nitrites or leukocyte esterase, 3 RBCs, 8 WBCs. Lactic acid on arrival 1.1. Chest x-ray, PA and lateral, on October 20th showed left upper lobe infiltrate versus atelectasis. Followup x-ray of the chest on the 23rd showed worsening pneumonia in the left upper lobe compared to previous exam.   HISTORY OF PRESENT ILLNESS AND HOSPITAL COURSE: For full details of H and P, please see the dictation on October 21st by Dr. Margaretmary Eddy, but briefly, this is a pleasant 74 year old male who came into the ER with chief complaint of nausea, vomiting, diarrhea,  shortness of breath. He was noted to have developing pneumonia on the x-ray. Admitted to the hospitalist service for that. He had fevers, tachycardia and leukocytosis and was also noted to have bouts of hypoxemia requiring oxygen. Per criteria, he had severe sepsis on admission. He was started on antibiotic treatment with azithromycin and ceftriaxone for suspected community-acquired pneumonia. He also did have sputum cultures ordered, but the patient also did complain of shortness of breath and pleuritic chest pain with coughing. He was also started on cough medicines as well. He did have off and on fevers still on the above medicines, and he was having a significant amount of cough. The Rocephin was changed to twice daily, and a repeat x-ray of the chest showed progression of the pneumonia. Since the twice daily dosing of Rocephin, the patient has slowly improved. His shortness of breath is better. The sepsis has resolved. His acute respiratory failure has resolved, and he has been weaned off of the oxygen. He will be discharged with Vantin as it is also a cephalosporin like Rocephin as he did have significant improvement with the Rocephin.   He did have renal failure on admission and that is likely chronic stage III. His creatinine has remained stable. At this time, he will be discharged. He is off of oxygen and is afebrile.   PHYSICAL EXAMINATION:  VITAL SIGNS: On the day of discharge, his temperature is 98.5, pulse rate 77, respiratory rate 20, blood pressure 130/78, O2 sat 92%  on room air.  GENERAL: The patient is a well-developed Caucasian male sitting in bed, no obvious distress.  HEENT: Normocephalic, atraumatic.  NECK: Supple.  LUNGS: Sounds are clear to auscultation without significant rhonchi or crackles.  ABDOMEN: Soft, nontender.  EXTREMITIES: No significant lower extremity edema.   At this point, he will be discharged with outpatient followup.   TOTAL TIME SPENT: 40 minutes.   CODE  STATUS: The patient is FULL CODE.   ____________________________ Vivien Presto, MD sa:gb D: 02/20/2013 15:48:12 ET T: 02/21/2013 01:05:16 ET JOB#: [id number]  cc: Vivien Presto, MD, <Dictator> Ria Bush, MD Vivien Presto MD ELECTRONICALLY SIGNED 03/17/2013 2:58

## 2014-09-27 DIAGNOSIS — A6002 Herpesviral infection of other male genital organs: Secondary | ICD-10-CM | POA: Diagnosis not present

## 2014-10-03 DIAGNOSIS — L0390 Cellulitis, unspecified: Secondary | ICD-10-CM | POA: Diagnosis not present

## 2014-10-03 DIAGNOSIS — N481 Balanitis: Secondary | ICD-10-CM | POA: Diagnosis not present

## 2014-10-11 ENCOUNTER — Ambulatory Visit (INDEPENDENT_AMBULATORY_CARE_PROVIDER_SITE_OTHER): Payer: Medicare Other | Admitting: Family Medicine

## 2014-10-11 ENCOUNTER — Encounter: Payer: Self-pay | Admitting: Family Medicine

## 2014-10-11 VITALS — BP 118/72 | HR 92 | Temp 98.2°F | Wt 207.2 lb

## 2014-10-11 DIAGNOSIS — R300 Dysuria: Secondary | ICD-10-CM

## 2014-10-11 DIAGNOSIS — I2510 Atherosclerotic heart disease of native coronary artery without angina pectoris: Secondary | ICD-10-CM | POA: Diagnosis not present

## 2014-10-11 DIAGNOSIS — L03314 Cellulitis of groin: Secondary | ICD-10-CM | POA: Diagnosis not present

## 2014-10-11 LAB — POCT URINALYSIS DIPSTICK
Blood, UA: NEGATIVE
GLUCOSE UA: NEGATIVE
NITRITE UA: NEGATIVE
Spec Grav, UA: 1.025
Urobilinogen, UA: 1
pH, UA: 6

## 2014-10-11 MED ORDER — DOXYCYCLINE HYCLATE 100 MG PO CAPS: 100.0000 mg | ORAL_CAPSULE | Freq: Two times a day (BID) | ORAL | Status: DC

## 2014-10-11 NOTE — Progress Notes (Signed)
BP 118/72 mmHg  Pulse 92  Temp(Src) 98.2 F (36.8 C) (Oral)  Wt 207 lb 4 oz (94.008 kg)   CC: fever and groin sore  Subjective:    Patient ID: Justin Ali, male    DOB: Oct 06, 1940, 74 y.o.   MRN: [id number]  HPI: Justin Ali is a 75 y.o. male presenting on 10/11/2014 for Fever   3d h/o fever Tmax 100.5, comes and goes. Vomited last night, decreased appetite, dizziness with fall this early am 2:30am.  No URI sxs. No abd pain or nausea. No new rashes. No headaches. Looser stools since colonoscopy but no other bowel changes recently. No fatigue, no night sweats or unexpected weight loss. Mowed yard yesterday. Endorses occasional dysuria but no frequency or urgency. No hematuria.   Noticed spot in R groin yesterday - swollen and tender. Denies recent bug bites or tick bites.  Hasn't tried any med for this. No sick contacts at home.  No new medications.   Relevant past medical, surgical, family and social history reviewed and updated as indicated. Interim medical history since our last visit reviewed. Allergies and medications reviewed and updated. Current Outpatient Prescriptions on File Prior to Visit  Medication Sig  . amLODipine (NORVASC) 5 MG tablet Take 1 tablet (5 mg total) by mouth daily.  Marland Kitchen aspirin EC 81 MG tablet Take 1 tablet (81 mg total) by mouth daily.  . cholecalciferol (VITAMIN D) 1000 UNITS tablet Take 2,000 Units by mouth daily.   . fish oil-omega-3 fatty acids 1000 MG capsule Take 1 g by mouth daily.   Marland Kitchen gabapentin (NEURONTIN) 300 MG capsule Take 1 capsule (300 mg total) by mouth at bedtime.  Marland Kitchen lisinopril (PRINIVIL,ZESTRIL) 5 MG tablet Take 1 tablet (5 mg total) by mouth daily.  . Melatonin 10 MG CAPS Take 1 capsule by mouth as needed.  . Multiple Vitamins-Minerals (MULTIVITAMIN PO) Take 1 tablet by mouth daily.   No current facility-administered medications on file prior to visit.    Review of Systems Per HPI unless specifically indicated above    Objective:    BP 118/72 mmHg  Pulse 92  Temp(Src) 98.2 F (36.8 C) (Oral)  Wt 207 lb 4 oz (94.008 kg)  Wt Readings from Last 3 Encounters:  10/11/14 207 lb 4 oz (94.008 kg)  06/07/14 208 lb 12 oz (94.688 kg)  03/22/14 205 lb (92.987 kg)    Physical Exam  Constitutional: He appears well-developed and well-nourished. No distress.  HENT:  Head: Normocephalic and atraumatic.  Mouth/Throat: Oropharynx is clear and moist. No oropharyngeal exudate.  Eyes: Conjunctivae and EOM are normal. Pupils are equal, round, and reactive to light. No scleral icterus.  Neck: Normal range of motion. Neck supple. No thyromegaly present.  Cardiovascular: Normal rate, regular rhythm, normal heart sounds and intact distal pulses.   No murmur heard. Pulmonary/Chest: Effort normal and breath sounds normal. No respiratory distress. He has no wheezes. He has no rales.  Abdominal: Hernia confirmed negative in the right inguinal area and confirmed negative in the left inguinal area.  Musculoskeletal: He exhibits no edema.  Lymphadenopathy:    He has no cervical adenopathy.       Right: No inguinal adenopathy present.       Left: No inguinal adenopathy present.  Skin: Skin is warm and dry. No rash noted. There is erythema.     R lower abdomen towards groin with central erosion with surrounding erythema/induration  Nursing note and vitals reviewed.  Assessment & Plan:  For endorsed dysuria - checked urinalysis today.  Problem List Items Addressed This Visit    Cellulitis of groin - Primary    Actually improved compared to yesterday. Not purulent today. Treat with doxy 7d course, warm compresses. Update if not improved with treatment.          Follow up plan: Return as needed.

## 2014-10-11 NOTE — Patient Instructions (Addendum)
I think you have cellulitis or skin infection. Treat with doxycycline twice daily for 7 days. Avoid sun on this medicine. Let us know if spreading redness or area coming to a head. Increase water. Urine checked today.

## 2014-10-11 NOTE — Progress Notes (Signed)
Pre visit review using our clinic review tool, if applicable. No additional management support is needed unless otherwise documented below in the visit note. 

## 2014-10-11 NOTE — Addendum Note (Signed)
Addended by: Royann Shivers A on: 10/11/2014 09:56 AM   Modules accepted: Orders

## 2014-10-11 NOTE — Assessment & Plan Note (Signed)
Actually improved compared to yesterday. Not purulent today. Treat with doxy 7d course, warm compresses. Update if not improved with treatment.

## 2014-12-26 ENCOUNTER — Ambulatory Visit (INDEPENDENT_AMBULATORY_CARE_PROVIDER_SITE_OTHER): Payer: Medicare Other | Admitting: Family Medicine

## 2014-12-26 ENCOUNTER — Encounter: Payer: Self-pay | Admitting: Family Medicine

## 2014-12-26 VITALS — BP 124/82 | HR 64 | Temp 98.2°F | Wt 206.5 lb

## 2014-12-26 DIAGNOSIS — I2510 Atherosclerotic heart disease of native coronary artery without angina pectoris: Secondary | ICD-10-CM | POA: Diagnosis not present

## 2014-12-26 DIAGNOSIS — L0201 Cutaneous abscess of face: Secondary | ICD-10-CM | POA: Insufficient documentation

## 2014-12-26 MED ORDER — SULFAMETHOXAZOLE-TRIMETHOPRIM 800-160 MG PO TABS: 1.0000 | ORAL_TABLET | Freq: Two times a day (BID) | ORAL | Status: DC

## 2014-12-26 NOTE — Progress Notes (Signed)
   BP 124/82 mmHg  Pulse 64  Temp(Src) 98.2 F (36.8 C) (Oral)  Wt 206 lb 8 oz (93.668 kg)   CC: chin infection  Subjective:    Patient ID: Justin Ali, male    DOB: 1940-05-15, 74 y.o.   MRN: [id number]  HPI: Justin Ali is a 74 y.o. male presenting on 12/26/2014 for Skin Problem   Noted knot under R chin 5d ago - may have started as ingrown hair which he removed with tweezer. Self treating with neosporin, peroxide and warm compresses. Some pus has drained out of this.  No fevers/chills, nausea.   Previous cellulitis groin 09/2014 - treated with 7 d doxy course.  Lab Results  Component Value Date   CREATININE 1.12 05/29/2014    Relevant past medical, surgical, family and social history reviewed and updated as indicated. Interim medical history since our last visit reviewed. Allergies and medications reviewed and updated. Current Outpatient Prescriptions on File Prior to Visit  Medication Sig  . amLODipine (NORVASC) 5 MG tablet Take 1 tablet (5 mg total) by mouth daily.  Marland Kitchen aspirin EC 81 MG tablet Take 1 tablet (81 mg total) by mouth daily.  . cholecalciferol (VITAMIN D) 1000 UNITS tablet Take 2,000 Units by mouth daily.   . fish oil-omega-3 fatty acids 1000 MG capsule Take 1 g by mouth daily.   Marland Kitchen gabapentin (NEURONTIN) 300 MG capsule Take 1 capsule (300 mg total) by mouth at bedtime.  Marland Kitchen lisinopril (PRINIVIL,ZESTRIL) 5 MG tablet Take 1 tablet (5 mg total) by mouth daily.  . Melatonin 10 MG CAPS Take 1 capsule by mouth as needed.  . Multiple Vitamins-Minerals (MULTIVITAMIN PO) Take 1 tablet by mouth daily.   No current facility-administered medications on file prior to visit.    Review of Systems Per HPI unless specifically indicated above     Objective:    BP 124/82 mmHg  Pulse 64  Temp(Src) 98.2 F (36.8 C) (Oral)  Wt 206 lb 8 oz (93.668 kg)  Wt Readings from Last 3 Encounters:  12/26/14 206 lb 8 oz (93.668 kg)  10/11/14 207 lb 4 oz (94.008 kg)  06/07/14  208 lb 12 oz (94.688 kg)    Physical Exam  Constitutional: He appears well-developed and well-nourished. No distress.  Skin: Skin is warm and dry. There is erythema.  Inferior to R chin is ~5cm of skin induration with surrounding erythema, central area drains small amt pus with expression but no significant fluctuance noted   Nursing note and vitals reviewed.      Assessment & Plan:   Problem List Items Addressed This Visit    Abscess of chin - Primary    Cellulitis/abscess of R inferior chin vs infected epidermal cyst - ?MRSA. Discussed options. I don't see significant amt to drain today and pt requests trial of abx first so I&D not performed - will treat with bactrim DS 1 tab bid x 10 d, RTC 2d for f/u visit and possible I&D. Discussed red flags to seek care prior. If no improvement and still nothing to drain, consider addition of amox/cephalosporin for better strep coverage.          Follow up plan: Return in about 2 days (around 12/28/2014), or as needed, for follow up visit.

## 2014-12-26 NOTE — Progress Notes (Signed)
Pre visit review using our clinic review tool, if applicable. No additional management support is needed unless otherwise documented below in the visit note. 

## 2014-12-26 NOTE — Patient Instructions (Addendum)
Treat with continued warm compresses several times daily, start bactrim DS 1 tablet twice daily - take with food. Return on Thursday for a recheck - if not better we will cut into it.  Return tomorrow if worsening otherwise see you Thursday.

## 2014-12-26 NOTE — Assessment & Plan Note (Addendum)
Cellulitis/abscess of R inferior chin vs infected epidermal cyst - ?MRSA. Discussed options. I don't see significant amt to drain today and pt requests trial of abx first so I&D not performed - will treat with bactrim DS 1 tab bid x 10 d, RTC 2d for f/u visit and possible I&D. Discussed red flags to seek care prior. If no improvement and still nothing to drain, consider addition of amox/cephalosporin for better strep coverage.

## 2014-12-28 ENCOUNTER — Encounter: Payer: Self-pay | Admitting: Family Medicine

## 2014-12-28 ENCOUNTER — Ambulatory Visit (INDEPENDENT_AMBULATORY_CARE_PROVIDER_SITE_OTHER): Payer: Medicare Other | Admitting: Family Medicine

## 2014-12-28 VITALS — BP 126/82 | HR 72 | Temp 98.3°F | Wt 206.5 lb

## 2014-12-28 DIAGNOSIS — L0201 Cutaneous abscess of face: Secondary | ICD-10-CM | POA: Diagnosis not present

## 2014-12-28 DIAGNOSIS — I2510 Atherosclerotic heart disease of native coronary artery without angina pectoris: Secondary | ICD-10-CM | POA: Diagnosis not present

## 2014-12-28 NOTE — Progress Notes (Signed)
Pre visit review using our clinic review tool, if applicable. No additional management support is needed unless otherwise documented below in the visit note. 

## 2014-12-28 NOTE — Assessment & Plan Note (Addendum)
Area has now come to a head. I&D performed today. Pt tolerated well. After care instructions provided. Finish bactrim. RTC Mon for recheck wound.  Pt agrees with plan.

## 2014-12-28 NOTE — Patient Instructions (Signed)
Incision and Drainage Incision and drainage is a procedure in which a sac-like structure (cystic structure) is opened and drained. The area to be drained usually contains material such as pus, fluid, or blood.  LET YOUR CAREGIVER KNOW ABOUT:   Allergies to medicine.  Medicines taken, including vitamins, herbs, eyedrops, over-the-counter medicines, and creams.  Use of steroids (by mouth or creams).  Previous problems with anesthetics or numbing medicines.  History of bleeding problems or blood clots.  Previous surgery.  Other health problems, including diabetes and kidney problems.  Possibility of pregnancy, if this applies. RISKS AND COMPLICATIONS  Pain.  Bleeding.  Scarring.  Infection. BEFORE THE PROCEDURE  You may need to have an ultrasound or other imaging tests to see how large or deep your cystic structure is. Blood tests may also be used to determine if you have an infection or how severe the infection is. You may need to have a tetanus shot. PROCEDURE  The affected area is cleaned with a cleaning fluid. The cyst area will then be numbed with a medicine (local anesthetic). A small incision will be made in the cystic structure. A syringe or catheter may be used to drain the contents of the cystic structure, or the contents may be squeezed out. The area will then be flushed with a cleansing solution. After cleansing the area, it is often gently packed with a gauze or another wound dressing. Once it is packed, it will be covered with gauze and tape or some other type of wound dressing. AFTER THE PROCEDURE   Often, you will be allowed to go home right after the procedure.  You may be given antibiotic medicine to prevent or heal an infection.  If the area was packed with gauze or some other wound dressing, you will likely need to come back in 1 to 2 days to get it removed.  The area should heal in about 14 days. Document Released: 10/08/2000 Document Revised: 10/14/2011  Document Reviewed: 06/09/2011 [employer] Patient Information 2015 [employer]. This information is not intended to replace advice given to you by your health care provider. Make sure you discuss any questions you have with your health care provider.  

## 2014-12-28 NOTE — Progress Notes (Addendum)
BP 126/82 mmHg  Pulse 72  Temp(Src) 98.3 F (36.8 C) (Oral)  Wt 206 lb 8 oz (93.668 kg)   CC: 2d f/u chin cellulitis/abscess  Subjective:    Patient ID: Justin Ali, male    DOB: August 25, 1940, 74 y.o.   MRN: [id number]  HPI: Justin Ali is a 74 y.o. male presenting on 12/28/2014 for Follow-up   See prior note for details. Last visit started on bactrim DS 1 tab BID x 10 days.  Returns today for f/u. Prior no I&D as not acutely fluctuant and already draining.   Tolerating bactrim ok. Still no fevers/nausea. Area has now come to a head. Continues draining some.  Relevant past medical, surgical, family and social history reviewed and updated as indicated. Interim medical history since our last visit reviewed. Allergies and medications reviewed and updated. Current Outpatient Prescriptions on File Prior to Visit  Medication Sig  . amLODipine (NORVASC) 5 MG tablet Take 1 tablet (5 mg total) by mouth daily.  Marland Kitchen aspirin EC 81 MG tablet Take 1 tablet (81 mg total) by mouth daily.  . cholecalciferol (VITAMIN D) 1000 UNITS tablet Take 2,000 Units by mouth daily.   . fish oil-omega-3 fatty acids 1000 MG capsule Take 1 g by mouth daily.   Marland Kitchen gabapentin (NEURONTIN) 300 MG capsule Take 1 capsule (300 mg total) by mouth at bedtime.  Marland Kitchen lisinopril (PRINIVIL,ZESTRIL) 5 MG tablet Take 1 tablet (5 mg total) by mouth daily.  . Melatonin 10 MG CAPS Take 1 capsule by mouth as needed.  . Multiple Vitamins-Minerals (MULTIVITAMIN PO) Take 1 tablet by mouth daily.  Marland Kitchen sulfamethoxazole-trimethoprim (BACTRIM DS,SEPTRA DS) 800-160 MG per tablet Take 1 tablet by mouth 2 (two) times daily.   No current facility-administered medications on file prior to visit.    Review of Systems Per HPI unless specifically indicated above     Objective:    BP 126/82 mmHg  Pulse 72  Temp(Src) 98.3 F (36.8 C) (Oral)  Wt 206 lb 8 oz (93.668 kg)  Wt Readings from Last 3 Encounters:  12/28/14 206 lb 8 oz (93.668  kg)  12/26/14 206 lb 8 oz (93.668 kg)  10/11/14 207 lb 4 oz (94.008 kg)    Physical Exam  Constitutional: He appears well-developed and well-nourished. No distress.  Skin: Skin is warm and dry.  Abscess that has come to a had inferior R chin with surrounding induration, mild erythema.  Nursing note and vitals reviewed.  I&D  Indication: abscess Pt complaints of: erythema, pain, swelling Location: R inferior chin Size: 2-3cm Verbal informed consent obtained.  Pt aware of risks not limited to but including infection, bleeding, damage to near by organs. Prep: betadine Anesthesia: 1%lidocaine with epi, good effect Incision made with #11 blade Would explored and loculations broken down. Deep cyst, able to remove part of wall but not in its entirety. Wound packed with iodoform gauze Pt tolerated well Routine postprocedure instructions d/w pt- remove packing in 24-48h, keep area clean and bandaged, follow up if concerns/spreading erythema/pain.     Assessment & Plan:   Problem List Items Addressed This Visit    Abscess of chin - Primary    Area has now come to a head. I&D performed today. Pt tolerated well. After care instructions provided. Finish bactrim. RTC Mon for recheck wound.  Pt agrees with plan.          Follow up plan: Return in about 5 days (around 01/02/2015), or if symptoms worsen or  fail to improve, for follow up visit.

## 2014-12-29 ENCOUNTER — Telehealth: Payer: Self-pay | Admitting: Family Medicine

## 2014-12-29 NOTE — Telephone Encounter (Signed)
[facility] Patient Name: Justin Ali Gender: Male DOB: Nov 10, 1940 Age: 74 Y 47 M 9 D Return Phone Number: [phone] (Primary) Address: City/State/Zip: [place] Client [facility] - Client Client Site [place] - Day Physician Ria Bush Contact Type Call Call Type Triage / Clinical Relationship To Patient Self Appointment Disposition EMR Appointment Not Necessary Info pasted into Epic Yes Return Phone Number [phone] (Primary) Chief Complaint Wound Check or Dressing Change Initial Comment Caller had a cyst removed on his chin yesterday. Is the caller supposed to do anything with the string near the wound ? Nurse Assessment Guidelines Guideline Title Affirmed Question Affirmed Notes Nurse Date/Time (Eastern Time) Disp. Time Eilene Ghazi Time) Disposition Final User After Care Instructions Given Call Event Type User Date / Time Description Comments User: Susanne Borders, RN Date/Time (Eastern Time): 12/29/2014 12:46:06 PM CALLER STATES THAT HE WAS IN THE OFFICE YESTERDAY AND HE WANTED TO KNOW HOW MUCH OF THE STRING THAT HE IS SUPPOSE TO BE PULLING OUT DAILY. CALLED THE OFFICE FOR THE PATIENT AND ASK TO SPEAK TO THE NURSE. WAS PUT ON HOLD AND THEY CAME BACK AND STATED THAT DR. Danise Mina STATES 1/4 OF INCH TO 1/2 OF INCH DAILY. INFORMED THE PATIENT OF THIS INFORMATION AND HE STATES THAT HE WILL DO SO. WILL CLOSE THIS CLINICAL CALL.

## 2015-01-02 ENCOUNTER — Ambulatory Visit (INDEPENDENT_AMBULATORY_CARE_PROVIDER_SITE_OTHER): Payer: Medicare Other | Admitting: Family Medicine

## 2015-01-02 ENCOUNTER — Encounter: Payer: Self-pay | Admitting: Family Medicine

## 2015-01-02 VITALS — BP 118/66 | HR 64 | Temp 97.8°F | Wt 206.5 lb

## 2015-01-02 DIAGNOSIS — Z23 Encounter for immunization: Secondary | ICD-10-CM

## 2015-01-02 DIAGNOSIS — L0201 Cutaneous abscess of face: Secondary | ICD-10-CM

## 2015-01-02 NOTE — Progress Notes (Signed)
Pre visit review using our clinic review tool, if applicable. No additional management support is needed unless otherwise documented below in the visit note. 

## 2015-01-02 NOTE — Progress Notes (Signed)
   BP 118/66 mmHg  Pulse 64  Temp(Src) 97.8 F (36.6 C) (Oral)  Wt 206 lb 8 oz (93.668 kg)   CC: I&D f/u visit  Subjective:    Patient ID: Justin Ali, male    DOB: 03/05/1941, 74 y.o.   MRN: [id number]  HPI: Justin Ali is a 74 y.o. male presenting on 01/02/2015 for Follow-up   See prior note for details. Seen here 8/30 then again 9/1 with chin abscess s/p I&D on 12/28/2014. Tolerating bactrim DS well. Overall healing well. Packing has come out, no longer draining. Wound has fully closed.  Relevant past medical, surgical, family and social history reviewed and updated as indicated. Interim medical history since our last visit reviewed. Allergies and medications reviewed and updated. Current Outpatient Prescriptions on File Prior to Visit  Medication Sig  . amLODipine (NORVASC) 5 MG tablet Take 1 tablet (5 mg total) by mouth daily.  Marland Kitchen aspirin EC 81 MG tablet Take 1 tablet (81 mg total) by mouth daily.  . cholecalciferol (VITAMIN D) 1000 UNITS tablet Take 2,000 Units by mouth daily.   . fish oil-omega-3 fatty acids 1000 MG capsule Take 1 g by mouth daily.   Marland Kitchen gabapentin (NEURONTIN) 300 MG capsule Take 1 capsule (300 mg total) by mouth at bedtime.  Marland Kitchen lisinopril (PRINIVIL,ZESTRIL) 5 MG tablet Take 1 tablet (5 mg total) by mouth daily.  . Melatonin 10 MG CAPS Take 1 capsule by mouth as needed.  . Multiple Vitamins-Minerals (MULTIVITAMIN PO) Take 1 tablet by mouth daily.  Marland Kitchen sulfamethoxazole-trimethoprim (BACTRIM DS,SEPTRA DS) 800-160 MG per tablet Take 1 tablet by mouth 2 (two) times daily.   No current facility-administered medications on file prior to visit.    Review of Systems Per HPI unless specifically indicated above     Objective:    BP 118/66 mmHg  Pulse 64  Temp(Src) 97.8 F (36.6 C) (Oral)  Wt 206 lb 8 oz (93.668 kg)  Wt Readings from Last 3 Encounters:  01/02/15 206 lb 8 oz (93.668 kg)  12/28/14 206 lb 8 oz (93.668 kg)  12/26/14 206 lb 8 oz (93.668 kg)      Physical Exam  Constitutional: He appears well-developed and well-nourished. No distress.  HENT:  Mild induration without surrounding erythema at site of I&D.   Vitals reviewed.     Assessment & Plan:   Problem List Items Addressed This Visit    Abscess of chin - Primary    Anticipate infected epidermal cyst. I&D wound now has healed. rec continued warm compresses and discussed final wound care. Update if not improving as expected. Finish antibiotics.          Follow up plan: Return if symptoms worsen or fail to improve.

## 2015-01-02 NOTE — Addendum Note (Signed)
Addended by: Royann Shivers A on: 01/02/2015 10:06 AM   Modules accepted: Orders

## 2015-01-02 NOTE — Patient Instructions (Signed)
Justin Ali is looking great today! Continue warm compresses and finish antibiotic. Ok to wash with soapy water then pat dry. Let us know if concerns. Flu shot x2 today.

## 2015-01-02 NOTE — Assessment & Plan Note (Signed)
Anticipate infected epidermal cyst. I&D wound now has healed. rec continued warm compresses and discussed final wound care. Update if not improving as expected. Finish antibiotics.

## 2015-03-26 ENCOUNTER — Encounter: Payer: Self-pay | Admitting: Family Medicine

## 2015-03-26 ENCOUNTER — Ambulatory Visit (INDEPENDENT_AMBULATORY_CARE_PROVIDER_SITE_OTHER): Payer: Medicare Other | Admitting: Family Medicine

## 2015-03-26 VITALS — BP 130/78 | HR 69 | Temp 98.4°F | Ht 66.0 in | Wt 210.0 lb

## 2015-03-26 DIAGNOSIS — I2510 Atherosclerotic heart disease of native coronary artery without angina pectoris: Secondary | ICD-10-CM | POA: Diagnosis not present

## 2015-03-26 DIAGNOSIS — J3489 Other specified disorders of nose and nasal sinuses: Secondary | ICD-10-CM | POA: Insufficient documentation

## 2015-03-26 MED ORDER — SULFAMETHOXAZOLE-TRIMETHOPRIM 800-160 MG PO TABS: 1.0000 | ORAL_TABLET | Freq: Two times a day (BID) | ORAL | Status: DC

## 2015-03-26 MED ORDER — MUPIROCIN 2 % EX OINT: 1.0000 "application " | TOPICAL_OINTMENT | Freq: Two times a day (BID) | CUTANEOUS | Status: DC

## 2015-03-26 NOTE — Patient Instructions (Signed)
I think you have an infected papule in your nose  Use the ointment in Both nostrils twice daily for 10 days or until better Take the bactrim oral antibiotic twice daily for 10 days Use a warm compress on nose and lip   If not improving in several days please let us know   Follow up with Dr Danise Mina late this week for a re check

## 2015-03-26 NOTE — Progress Notes (Signed)
Subjective:    Patient ID: Justin Ali, male    DOB: 01-12-1941, 74 y.o.   MRN: [id number]  HPI Here with a bump in his nose (L nostril)  Now it feels swollen /has a scab on it  Hurts/tender Using advil (2 pills twice daily)  Also A and D oint in nose   No fever  No facial redness  Upper lip on that L side feels swollen   Patient Active Problem List   Diagnosis Date Noted  . Abscess of chin 12/26/2014  . Cellulitis of groin 10/11/2014  . Advanced care planning/counseling discussion 06/07/2014  . Obesity 06/07/2014  . Hearing loss due to cerumen impaction 06/07/2014  . CAD (coronary artery disease), native coronary artery 06/02/2013  . Angiodysplasia of cecum 03/18/2013  . Personal history of colonic adenomas 08/05/2012  . Medicare annual wellness visit, subsequent 06/02/2012  . Left knee pain 06/02/2011  . Fatty liver 06/02/2011  . HYPERTENSION, BENIGN ESSENTIAL 11/02/2006  . Dyslipidemia 10/27/2006  . RESTLESS LEG SYNDROME 10/27/2006  . PERIPHERAL NEUROPATHY 10/27/2006  . SLEEP APNEA 10/27/2006   Past Medical History  Diagnosis Date  . Hyperglycemia   . HTN (hypertension)   . Other benign neoplasm of connective and other soft tissue of unspecified site     Neurofibroma of lateral periorbital area  . Peripheral neuropathy ([place])   . Restless legs syndrome (RLS)   . OSA (obstructive sleep apnea)     did not tolerate CPAP  . HLD (hyperlipidemia)   . NAFLD (nonalcoholic fatty liver disease) 08/2011    by abd [place] with increased LFTs  . Personal history of colonic adenomas 08/05/2012  . Pneumonia 01/2013    CAP LUL  . Angiodysplasia of cecum 03/18/2013    2 mm - non-bleeding 03/18/2013 colonoscopy   . CAD (coronary artery disease), native coronary artery 2014    By CT lung (04/2013)   . Sleep apnea     no cpap  . Cataract    Past Surgical History  Procedure Laterality Date  . Skin cancer excision  1997    left anterior neck  . Orif ankle fracture  11/29/00   Dr. Tamala Julian  . Colonoscopy  07/2012    5 adenomas (tubular, TV, serrated), rec rpt 5 months [name])  . Colonoscopy  02/2013    residual adenomas, cecal AVM, rec rpt 1 yr [name])  . Colonoscopy  03/2014    no residual polyp, cecal AVM, rpt 3-4 yrs [name])   Social History  Substance Use Topics  . Smoking status: Former Smoker    Types: Cigarettes, Cigars    Quit date: 04/28/2002  . Smokeless tobacco: Never Used     Comment: rare cigar  . Alcohol Use: 2.4 oz/week    2 Standard drinks or equivalent, 2 Shots of liquor per week     Comment: Weekends   Family History  Problem Relation Age of Onset  . Cancer Father     liver  . Alcohol abuse Father   . Liver cancer Father   . Cancer Mother     breast  . Breast cancer Mother   . Hypertension Brother     Half-brother  . Coronary artery disease Paternal Aunt     CABG  . Cancer Other     GM; Aunt - breast  . Colon cancer Neg Hx   . Rectal cancer Neg Hx   . Stomach cancer Neg Hx   . Esophageal cancer Neg Hx  No Known Allergies Current Outpatient Prescriptions on File Prior to Visit  Medication Sig Dispense Refill  . amLODipine (NORVASC) 5 MG tablet Take 1 tablet (5 mg total) by mouth daily. 90 tablet 3  . aspirin EC 81 MG tablet Take 1 tablet (81 mg total) by mouth daily.    . cholecalciferol (VITAMIN D) 1000 UNITS tablet Take 2,000 Units by mouth daily.     . fish oil-omega-3 fatty acids 1000 MG capsule Take 1 g by mouth daily.     Marland Kitchen gabapentin (NEURONTIN) 300 MG capsule Take 1 capsule (300 mg total) by mouth at bedtime. 90 capsule 3  . lisinopril (PRINIVIL,ZESTRIL) 5 MG tablet Take 1 tablet (5 mg total) by mouth daily. 90 tablet 3  . Melatonin 10 MG CAPS Take 1 capsule by mouth as needed.    . Multiple Vitamins-Minerals (MULTIVITAMIN PO) Take 1 tablet by mouth daily.     No current facility-administered medications on file prior to visit.      Review of Systems Review of Systems  Constitutional: Negative for fever,  appetite change, fatigue and unexpected weight change.  Eyes: Negative for pain and visual disturbance.  ENT pos for scab/lesion in nose with swelling that is painful Respiratory: Negative for cough and shortness of breath.   Cardiovascular: Negative for cp or palpitations    Gastrointestinal: Negative for nausea, diarrhea and constipation.  Genitourinary: Negative for urgency and frequency.  Skin: Negative for pallor or rash   Neurological: Negative for weakness, light-headedness, numbness and headaches.  Hematological: Negative for adenopathy. Does not bruise/bleed easily.  Psychiatric/Behavioral: Negative for dysphoric mood. The patient is not nervous/anxious.         Objective:   Physical Exam  Constitutional: He appears well-developed and well-nourished. No distress.  HENT:  Head: Normocephalic and atraumatic.  Right Ear: External ear normal.  Left Ear: External ear normal.  Mouth/Throat: Oropharynx is clear and moist. No oropharyngeal exudate.  3-4 mm papule with scab/red and swollen in L nostril  Some swelling above L upper lip as well  No fluctuance No active drainage   No sinus tenderness  No rash or streaking     Eyes: Conjunctivae and EOM are normal. Pupils are equal, round, and reactive to light. Right eye exhibits no discharge. Left eye exhibits no discharge.  Neck: Normal range of motion. Neck supple.  Cardiovascular: Normal rate and regular rhythm.   Pulmonary/Chest: Effort normal and breath sounds normal.  Lymphadenopathy:    He has no cervical adenopathy.  Neurological: He is alert.  Skin: Skin is warm and dry. No rash noted. There is erythema. No pallor.  Psychiatric: He has a normal mood and affect.          Assessment & Plan:   Problem List Items Addressed This Visit      Other   Infection of nose - Primary    Infected papule in L nostril  Of note- pt recently had chin abscess (this is better) ? Wonder if he carried [employer] tx with bactrim  ds for 10 d and also bactroban ointment (both nostrils bid)   Warm compresses  Update if not starting to improve in a week or if worsening   F/u PCP for re check later this week        Relevant Medications   mupirocin ointment (BACTROBAN) 2 %   sulfamethoxazole-trimethoprim (BACTRIM DS,SEPTRA DS) 800-160 MG tablet

## 2015-03-26 NOTE — Progress Notes (Signed)
Pre visit review using our clinic review tool, if applicable. No additional management support is needed unless otherwise documented below in the visit note. 

## 2015-03-26 NOTE — Assessment & Plan Note (Signed)
Infected papule in L nostril  Of note- pt recently had chin abscess (this is better) ? Wonder if he carried [employer] tx with bactrim ds for 10 d and also bactroban ointment (both nostrils bid)   Warm compresses  Update if not starting to improve in a week or if worsening   F/u PCP for re check later this week

## 2015-04-02 ENCOUNTER — Encounter: Payer: Self-pay | Admitting: Family Medicine

## 2015-04-02 ENCOUNTER — Ambulatory Visit (INDEPENDENT_AMBULATORY_CARE_PROVIDER_SITE_OTHER): Payer: Medicare Other | Admitting: Family Medicine

## 2015-04-02 VITALS — BP 118/74 | HR 88 | Temp 98.3°F | Wt 207.0 lb

## 2015-04-02 DIAGNOSIS — J3489 Other specified disorders of nose and nasal sinuses: Secondary | ICD-10-CM | POA: Diagnosis not present

## 2015-04-02 DIAGNOSIS — I2510 Atherosclerotic heart disease of native coronary artery without angina pectoris: Secondary | ICD-10-CM

## 2015-04-02 NOTE — Progress Notes (Signed)
   BP 118/74 mmHg  Pulse 88  Temp(Src) 98.3 F (36.8 C) (Oral)  Wt 207 lb (93.895 kg)   CC: recheck nose  Subjective:    Patient ID: Justin Ali, male    DOB: Aug 24, 1940, 73 y.o.   MRN: [id number]  HPI: Justin Ali is a 74 y.o. male presenting on 04/02/2015 for Follow-up   Recently seen by Dr Glori Bickers. Note reviewed. 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 He has been using warm compresses as well.   Tolerating bactrim and bactroban well.   Relevant past medical, surgical, family and social history reviewed and updated as indicated. Interim medical history since our last visit reviewed. Allergies and medications reviewed and updated. Current Outpatient Prescriptions on File Prior to Visit  Medication Sig  . amLODipine (NORVASC) 5 MG tablet Take 1 tablet (5 mg total) by mouth daily.  Marland Kitchen aspirin EC 81 MG tablet Take 1 tablet (81 mg total) by mouth daily.  . cholecalciferol (VITAMIN D) 1000 UNITS tablet Take 2,000 Units by mouth daily.   . fish oil-omega-3 fatty acids 1000 MG capsule Take 1 g by mouth daily.   Marland Kitchen gabapentin (NEURONTIN) 300 MG capsule Take 1 capsule (300 mg total) by mouth at bedtime.  Marland Kitchen lisinopril (PRINIVIL,ZESTRIL) 5 MG tablet Take 1 tablet (5 mg total) by mouth daily.  . Melatonin 10 MG CAPS Take 1 capsule by mouth as needed.  . Multiple Vitamins-Minerals (MULTIVITAMIN PO) Take 1 tablet by mouth daily.  . mupirocin ointment (BACTROBAN) 2 % Apply 1 application topically 2 (two) times daily. In both nostrils twice daily  . sulfamethoxazole-trimethoprim (BACTRIM DS,SEPTRA DS) 800-160 MG tablet Take 1 tablet by mouth 2 (two) times daily.   No current facility-administered medications on file prior to visit.    Review of Systems Per HPI unless specifically indicated in ROS section     Objective:    BP 118/74 mmHg  Pulse 88  Temp(Src) 98.3 F (36.8 C) (Oral)  Wt 207 lb (93.895 kg)  Wt  Readings from Last 3 Encounters:  04/02/15 207 lb (93.895 kg)  03/26/15 210 lb (95.255 kg)  01/02/15 206 lb 8 oz (93.668 kg)    Physical Exam  Constitutional: He appears well-developed and well-nourished. No distress.  HENT:  Mouth/Throat: Uvula is midline and oropharynx is clear and moist. No oropharyngeal exudate, posterior oropharyngeal edema or posterior oropharyngeal erythema.  Small scab left medial inner lower nostril  Mild erythema nasal mucosa  Lymphadenopathy:    He has no cervical adenopathy.  Nursing note and vitals reviewed.     Assessment & Plan:   Problem List Items Addressed This Visit    Infection of nose - Primary    Healing well. Reassured. Finish bactrim and bactroban. Update if recurrent infection.           Follow up plan: Return if symptoms worsen or fail to improve.

## 2015-04-02 NOTE — Patient Instructions (Signed)
I think you are healing well. Finish bactrim and bactroban. Call us if any recurrent skin infection.

## 2015-04-02 NOTE — Progress Notes (Signed)
Pre visit review using our clinic review tool, if applicable. No additional management support is needed unless otherwise documented below in the visit note. 

## 2015-04-02 NOTE — Assessment & Plan Note (Signed)
Healing well. Reassured. Finish bactrim and bactroban. Update if recurrent infection.

## 2015-05-22 DIAGNOSIS — H2513 Age-related nuclear cataract, bilateral: Secondary | ICD-10-CM | POA: Diagnosis not present

## 2015-06-06 ENCOUNTER — Other Ambulatory Visit: Payer: Self-pay | Admitting: Family Medicine

## 2015-06-06 DIAGNOSIS — E785 Hyperlipidemia, unspecified: Secondary | ICD-10-CM

## 2015-06-06 DIAGNOSIS — E559 Vitamin D deficiency, unspecified: Secondary | ICD-10-CM

## 2015-06-06 DIAGNOSIS — I10 Essential (primary) hypertension: Secondary | ICD-10-CM

## 2015-06-06 DIAGNOSIS — K760 Fatty (change of) liver, not elsewhere classified: Secondary | ICD-10-CM

## 2015-06-06 DIAGNOSIS — N289 Disorder of kidney and ureter, unspecified: Secondary | ICD-10-CM

## 2015-06-06 DIAGNOSIS — Z125 Encounter for screening for malignant neoplasm of prostate: Secondary | ICD-10-CM

## 2015-06-07 ENCOUNTER — Other Ambulatory Visit (INDEPENDENT_AMBULATORY_CARE_PROVIDER_SITE_OTHER): Payer: PPO

## 2015-06-07 DIAGNOSIS — R7989 Other specified abnormal findings of blood chemistry: Secondary | ICD-10-CM

## 2015-06-07 DIAGNOSIS — E785 Hyperlipidemia, unspecified: Secondary | ICD-10-CM

## 2015-06-07 DIAGNOSIS — Z125 Encounter for screening for malignant neoplasm of prostate: Secondary | ICD-10-CM | POA: Diagnosis not present

## 2015-06-07 DIAGNOSIS — E559 Vitamin D deficiency, unspecified: Secondary | ICD-10-CM

## 2015-06-07 DIAGNOSIS — N289 Disorder of kidney and ureter, unspecified: Secondary | ICD-10-CM | POA: Diagnosis not present

## 2015-06-07 DIAGNOSIS — I10 Essential (primary) hypertension: Secondary | ICD-10-CM

## 2015-06-07 DIAGNOSIS — K760 Fatty (change of) liver, not elsewhere classified: Secondary | ICD-10-CM

## 2015-06-07 LAB — LIPID PANEL
Cholesterol: 151 mg/dL (ref 0–200)
HDL: 27 mg/dL — ABNORMAL LOW (ref >39.00)
NONHDL: 123.95
TRIGLYCERIDES: 215 mg/dL — AB (ref 0.0–149.0)
Total CHOL/HDL Ratio: 6
VLDL: 43 mg/dL — ABNORMAL HIGH (ref 0.0–40.0)

## 2015-06-07 LAB — CBC WITH DIFFERENTIAL/PLATELET
BASOS ABS: 0 10*3/uL (ref 0.0–0.1)
BASOS PCT: 0.5 % (ref 0.0–3.0)
EOS ABS: 0.3 10*3/uL (ref 0.0–0.7)
Eosinophils Relative: 4.5 % (ref 0.0–5.0)
HCT: 43.4 % (ref 39.0–52.0)
Hemoglobin: 14.6 g/dL (ref 13.0–17.0)
LYMPHS ABS: 2.1 10*3/uL (ref 0.7–4.0)
LYMPHS PCT: 31.5 % (ref 12.0–46.0)
MCHC: 33.7 g/dL (ref 30.0–36.0)
MCV: 89.8 fl (ref 78.0–100.0)
MONO ABS: 0.4 10*3/uL (ref 0.1–1.0)
Monocytes Relative: 6.8 % (ref 3.0–12.0)
NEUTROS ABS: 3.7 10*3/uL (ref 1.4–7.7)
NEUTROS PCT: 56.7 % (ref 43.0–77.0)
PLATELETS: 194 10*3/uL (ref 150.0–400.0)
RBC: 4.83 Mil/uL (ref 4.22–5.81)
RDW: 13.8 % (ref 11.5–15.5)
WBC: 6.6 10*3/uL (ref 4.0–10.5)

## 2015-06-07 LAB — COMPREHENSIVE METABOLIC PANEL
ALK PHOS: 104 U/L (ref 39–117)
ALT: 47 U/L (ref 0–53)
AST: 33 U/L (ref 0–37)
Albumin: 4 g/dL (ref 3.5–5.2)
BILIRUBIN TOTAL: 0.7 mg/dL (ref 0.2–1.2)
BUN: 11 mg/dL (ref 6–23)
CO2: 32 mEq/L (ref 19–32)
Calcium: 9.4 mg/dL (ref 8.4–10.5)
Chloride: 108 mEq/L (ref 96–112)
Creatinine, Ser: 1.14 mg/dL (ref 0.40–1.50)
GFR: 66.82 mL/min (ref >60.00)
GLUCOSE: 112 mg/dL — AB (ref 70–99)
Potassium: 4.2 mEq/L (ref 3.5–5.1)
SODIUM: 144 meq/L (ref 135–145)
TOTAL PROTEIN: 6.6 g/dL (ref 6.0–8.3)

## 2015-06-07 LAB — PSA, MEDICARE: PSA: 2.21 ng/ml (ref 0.10–4.00)

## 2015-06-07 LAB — MAGNESIUM: MAGNESIUM: 2.1 mg/dL (ref 1.5–2.5)

## 2015-06-07 LAB — VITAMIN D 25 HYDROXY (VIT D DEFICIENCY, FRACTURES): VITD: 63.83 ng/mL (ref 30.00–100.00)

## 2015-06-07 LAB — LDL CHOLESTEROL, DIRECT: LDL DIRECT: 87 mg/dL

## 2015-06-12 ENCOUNTER — Encounter: Payer: Self-pay | Admitting: Family Medicine

## 2015-06-12 ENCOUNTER — Ambulatory Visit (INDEPENDENT_AMBULATORY_CARE_PROVIDER_SITE_OTHER): Payer: PPO | Admitting: Family Medicine

## 2015-06-12 VITALS — BP 124/78 | HR 84 | Temp 98.3°F | Wt 213.0 lb

## 2015-06-12 DIAGNOSIS — I2510 Atherosclerotic heart disease of native coronary artery without angina pectoris: Secondary | ICD-10-CM | POA: Diagnosis not present

## 2015-06-12 DIAGNOSIS — Z0000 Encounter for general adult medical examination without abnormal findings: Secondary | ICD-10-CM

## 2015-06-12 DIAGNOSIS — B9789 Other viral agents as the cause of diseases classified elsewhere: Secondary | ICD-10-CM

## 2015-06-12 DIAGNOSIS — N400 Enlarged prostate without lower urinary tract symptoms: Secondary | ICD-10-CM

## 2015-06-12 DIAGNOSIS — E785 Hyperlipidemia, unspecified: Secondary | ICD-10-CM

## 2015-06-12 DIAGNOSIS — J069 Acute upper respiratory infection, unspecified: Secondary | ICD-10-CM | POA: Diagnosis not present

## 2015-06-12 DIAGNOSIS — E669 Obesity, unspecified: Secondary | ICD-10-CM

## 2015-06-12 DIAGNOSIS — Z7189 Other specified counseling: Secondary | ICD-10-CM | POA: Diagnosis not present

## 2015-06-12 DIAGNOSIS — I10 Essential (primary) hypertension: Secondary | ICD-10-CM

## 2015-06-12 MED ORDER — GABAPENTIN 300 MG PO CAPS: 300.0000 mg | ORAL_CAPSULE | Freq: Every day | ORAL | Status: DC

## 2015-06-12 MED ORDER — AMLODIPINE BESYLATE 5 MG PO TABS: 5.0000 mg | ORAL_TABLET | Freq: Every day | ORAL | Status: DC

## 2015-06-12 MED ORDER — LISINOPRIL 5 MG PO TABS: 5.0000 mg | ORAL_TABLET | Freq: Every day | ORAL | Status: DC

## 2015-06-12 NOTE — Assessment & Plan Note (Signed)
Advanced directives - has at home. Wife would be HCPOA. No desire for prolonged life support. Will bring me copy. Wants to be [facility] in [facility].

## 2015-06-12 NOTE — Assessment & Plan Note (Signed)
Anticipate viral at this time. No signs of bacterial infection. Update in next 4-5 days - if no improvement, consider zpack to cover atypical bronchitis.

## 2015-06-12 NOTE — Assessment & Plan Note (Signed)
Chronic, stable. Continue current regimen. 

## 2015-06-12 NOTE — Assessment & Plan Note (Signed)

## 2015-06-12 NOTE — Addendum Note (Signed)
Addended by: Ria Bush on: 06/12/2015 03:11 PM   Modules accepted: Orders, SmartSet

## 2015-06-12 NOTE — Assessment & Plan Note (Signed)
Noted on exam today. Asxs, continue to monitor.

## 2015-06-12 NOTE — Progress Notes (Signed)
BP 124/78 mmHg  Pulse 84  Temp(Src) 98.3 F (36.8 C) (Oral)  Wt 213 lb (96.616 kg)   CC: medicare wellness visit  Subjective:    Patient ID: Justin Ali, male    DOB: 1941-03-27, 75 y.o.   MRN: [id number]  HPI: Justin Ali is a 75 y.o. male presenting on 06/12/2015 for Annual Exam   "I've been having a cold for 1 wk". Now with ST with cough productive of green mucous. Some coughing fits. No fevers/chills. No ear or tooth pain, nasal congestion.   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 Vision screen recently done with eye doctor 04/2015. No falls in last year. No anhedonia. Denies depressed.  Preventative: COLONOSCOPY Date: 03/2014 no residual polyp, cecal AVM, rpt 3-4 yrs [name]) Prostate - gets checked yearly. No nocturia, no weakness of stream. No known fmhx prostate cancer.  Flu shot - yearly Td - 2012  Pneumovax 2012, prevnar 2015 zostavax - insurance won't cover so not interested. Doesn't think has had chicken pox.  Advanced directives - has at home. Wife would be HCPOA. No desire for prolonged life support. Will bring me copy. [facility].  Seat belt use discussed Sunscreen use discussed. No changing moles.   Caffeine: occasional coffee  Lives with wife, 1 cat  Occupation: truck Animator)  Activity: goes to [employer] 3x/wk  Diet: rare water, lots of sweet tea, some fruits/vegetables   Relevant past medical, surgical, family and social history reviewed and updated as indicated. Interim medical history since our last visit reviewed. Allergies and medications reviewed and updated. Current Outpatient Prescriptions on File Prior to Visit  Medication Sig  . amLODipine (NORVASC) 5 MG tablet Take 1 tablet (5 mg total) by mouth daily.  Marland Kitchen aspirin EC 81 MG tablet Take 1 tablet (81 mg total) by mouth daily.  . cholecalciferol (VITAMIN D) 1000 UNITS tablet Take 2,000 Units by mouth daily.   . fish oil-omega-3 fatty  acids 1000 MG capsule Take 2 g by mouth daily.   Marland Kitchen gabapentin (NEURONTIN) 300 MG capsule Take 1 capsule (300 mg total) by mouth at bedtime.  Marland Kitchen lisinopril (PRINIVIL,ZESTRIL) 5 MG tablet Take 1 tablet (5 mg total) by mouth daily.  . Melatonin 10 MG CAPS Take 1 capsule by mouth as needed.  . Multiple Vitamins-Minerals (MULTIVITAMIN PO) Take 1 tablet by mouth daily.   No current facility-administered medications on file prior to visit.    Review of Systems Per HPI unless specifically indicated in ROS section     Objective:    BP 124/78 mmHg  Pulse 84  Temp(Src) 98.3 F (36.8 C) (Oral)  Wt 213 lb (96.616 kg)  Wt Readings from Last 3 Encounters:  06/12/15 213 lb (96.616 kg)  04/02/15 207 lb (93.895 kg)  03/26/15 210 lb (95.255 kg)   Body mass index is 34.4 kg/(m^2). Physical Exam  Constitutional: He is oriented to person, place, and time. He appears well-developed and well-nourished. No distress.  HENT:  Head: Normocephalic and atraumatic.  Right Ear: Hearing, tympanic membrane, external ear and ear canal normal.  Left Ear: Hearing, tympanic membrane, external ear and ear canal normal.  Nose: Nose normal.  Mouth/Throat: Uvula is midline, oropharynx is clear and moist and mucous membranes are normal. No oropharyngeal exudate, posterior oropharyngeal edema or posterior oropharyngeal erythema.  R canal with cerumen impaction cleared with plastic curette Nasal mucosal injection  Eyes: Conjunctivae and EOM are normal. Pupils are equal, round,  and reactive to light. No scleral icterus.  Neck: Normal range of motion. Neck supple. Carotid bruit is not present. No thyromegaly present.  Cardiovascular: Normal rate, regular rhythm, normal heart sounds and intact distal pulses.   No murmur heard. Pulses:      Radial pulses are 2+ on the right side, and 2+ on the left side.  Pulmonary/Chest: Effort normal and breath sounds normal. No respiratory distress. He has no wheezes. He has no rales.    Abdominal: Soft. Bowel sounds are normal. He exhibits no distension and no mass. There is no tenderness. There is no rebound and no guarding.  Genitourinary: Rectum normal. Rectal exam shows no external hemorrhoid, no internal hemorrhoid, no fissure, no mass, no tenderness and anal tone normal. Prostate is enlarged (30gm). Prostate is not tender.  Musculoskeletal: Normal range of motion. He exhibits no edema.  Lymphadenopathy:    He has no cervical adenopathy.  Neurological: He is alert and oriented to person, place, and time.  CN grossly intact, station and gait intact Recall 3/3 Calculation 4/5 serial 3s  Skin: Skin is warm and dry. No rash noted.  Psychiatric: He has a normal mood and affect. His behavior is normal. Judgment and thought content normal.  Nursing note and vitals reviewed.  Results for orders placed or performed in visit on 06/07/15  Lipid panel  Result Value Ref Range   Cholesterol 151 0 - 200 mg/dL   Triglycerides 215.0 (H) 0.0 - 149.0 mg/dL   HDL 27.00 (L) >39.00 mg/dL   VLDL 43.0 (H) 0.0 - 40.0 mg/dL   Total CHOL/HDL Ratio 6    NonHDL 123.95   Comprehensive metabolic panel  Result Value Ref Range   Sodium 144 135 - 145 mEq/L   Potassium 4.2 3.5 - 5.1 mEq/L   Chloride 108 96 - 112 mEq/L   CO2 32 19 - 32 mEq/L   Glucose, Bld 112 (H) 70 - 99 mg/dL   BUN 11 6 - 23 mg/dL   Creatinine, Ser 1.14 0.40 - 1.50 mg/dL   Total Bilirubin 0.7 0.2 - 1.2 mg/dL   Alkaline Phosphatase 104 39 - 117 U/L   AST 33 0 - 37 U/L   ALT 47 0 - 53 U/L   Total Protein 6.6 6.0 - 8.3 g/dL   Albumin 4.0 3.5 - 5.2 g/dL   Calcium 9.4 8.4 - 10.5 mg/dL   GFR 66.82 >60.00 mL/min  PSA, Medicare  Result Value Ref Range   PSA 2.21 0.10 - 4.00 ng/ml  CBC with Differential/Platelet  Result Value Ref Range   WBC 6.6 4.0 - 10.5 K/uL   RBC 4.83 4.22 - 5.81 Mil/uL   Hemoglobin 14.6 13.0 - 17.0 g/dL   HCT 43.4 39.0 - 52.0 %   MCV 89.8 78.0 - 100.0 fl   MCHC 33.7 30.0 - 36.0 g/dL   RDW 13.8  11.5 - 15.5 %   Platelets 194.0 150.0 - 400.0 K/uL   Neutrophils Relative % 56.7 43.0 - 77.0 %   Lymphocytes Relative 31.5 12.0 - 46.0 %   Monocytes Relative 6.8 3.0 - 12.0 %   Eosinophils Relative 4.5 0.0 - 5.0 %   Basophils Relative 0.5 0.0 - 3.0 %   Neutro Abs 3.7 1.4 - 7.7 K/uL   Lymphs Abs 2.1 0.7 - 4.0 K/uL   Monocytes Absolute 0.4 0.1 - 1.0 K/uL   Eosinophils Absolute 0.3 0.0 - 0.7 K/uL   Basophils Absolute 0.0 0.0 - 0.1 K/uL  VITAMIN D 25  Hydroxy (Vit-D Deficiency, Fractures)  Result Value Ref Range   VITD 63.83 30.00 - 100.00 ng/mL  Magnesium  Result Value Ref Range   Magnesium 2.1 1.5 - 2.5 mg/dL  LDL cholesterol, direct  Result Value Ref Range   Direct LDL 87.0 mg/dL      Assessment & Plan:   Problem List Items Addressed This Visit    Viral URI with cough    Anticipate viral at this time. No signs of bacterial infection. Update in next 4-5 days - if no improvement, consider zpack to cover atypical bronchitis.      Obesity, Class I, BMI 30-34.9    Discussed healthy diet and lifestyle changes to affect sustainable weight loss. Declines nutritionist referral. Asks about bariatric medication. Has bought new weight loss supplement he will try for 3 months, discussed risks of taking online supplements - will bring me bottle to review.       Medicare annual wellness visit, subsequent - Primary    I have personally reviewed the Medicare Annual Wellness questionnaire and have noted 1. The patient's medical and social history 2. Their use of alcohol, tobacco or illicit drugs 3. Their current medications and supplements 4. The patient's functional ability including ADL's, fall risks, home safety risks and hearing or visual impairment. Cognitive function has been assessed and addressed as indicated.  5. Diet and physical activity 6. Evidence for depression or mood disorders The patients weight, height, BMI have been recorded in the chart. I have made referrals, counseling  and provided education to the patient based on review of the above and I have provided the pt with a written personalized care plan for preventive services. Provider list updated.. See scanned questionairre as needed for further documentation. Reviewed preventative protocols and updated unless pt declined.       HYPERTENSION, BENIGN ESSENTIAL    Chronic, stable. Continue current regimen.      Dyslipidemia    Increase fish oil to 2 gm daily. Not on statin. Discussed aerobic exercise to improve HDL      CAD (coronary artery disease), native coronary artery    By imaging CT lung 04/2013. Not on statin. Continue aspirin. LDL at goal <100      BPH (benign prostatic hypertrophy)    Noted on exam today. Asxs, continue to monitor.      Advanced care planning/counseling discussion    Advanced directives - has at home. Wife would be HCPOA. No desire for prolonged life support. Will bring me copy. [facility] to be [facility] in [facility].           Follow up plan: Return in about 1 year (around 06/11/2016), or as needed, for medicare wellness visit.

## 2015-06-12 NOTE — Progress Notes (Signed)
Pre visit review using our clinic review tool, if applicable. No additional management support is needed unless otherwise documented below in the visit note. 

## 2015-06-12 NOTE — Assessment & Plan Note (Signed)
Discussed healthy diet and lifestyle changes to affect sustainable weight loss. Declines nutritionist referral. Asks about bariatric medication. Has bought new weight loss supplement he will try for 3 months, discussed risks of taking online supplements - will bring me bottle to review.

## 2015-06-12 NOTE — Assessment & Plan Note (Signed)
Increase fish oil to 2 gm daily. Not on statin. Discussed aerobic exercise to improve HDL

## 2015-06-12 NOTE — Patient Instructions (Addendum)
Mail me copy of living will and health care power of attorney form. Watch sugar intake - avoid added sugar and sweetened beverages. Increase fish oil to 2 tablets daily. Give upper respiratory infection a few more days - if not improving as expected call me with update at end of week. Push fluids and rest. Use plain mucinex with plenty of water to help mobilize mucous   Let me know how weight is doing with new medicine.  Good to see you today. Return as needed or in 1 year for next medicare wellness visit.  Health Maintenance, Male A healthy lifestyle and preventative care can promote health and wellness.  Maintain regular health, dental, and eye exams.  Eat a healthy diet. Foods like vegetables, fruits, whole grains, low-fat dairy products, and lean protein foods contain the nutrients you need and are low in calories. Decrease your intake of foods high in solid fats, added sugars, and salt. Get information about a proper diet from your health care provider, if necessary.  Regular physical exercise is one of the most important things you can do for your health. Most adults should get at least 150 minutes of moderate-intensity exercise (any activity that increases your heart rate and causes you to sweat) each week. In addition, most adults need muscle-strengthening exercises on 2 or more days a week.   Maintain a healthy weight. The body mass index (BMI) is a screening tool to identify possible weight problems. It provides an estimate of body fat based on height and weight. Your health care provider can find your BMI and can help you achieve or maintain a healthy weight. For males 20 years and older:  A BMI below 18.5 is considered underweight.  A BMI of 18.5 to 24.9 is normal.  A BMI of 25 to 29.9 is considered overweight.  A BMI of 30 and above is considered obese.  Maintain normal blood lipids and cholesterol by exercising and minimizing your intake of saturated fat. Eat a balanced diet  with plenty of fruits and vegetables. Blood tests for lipids and cholesterol should begin at age 32 and be repeated every 5 years. If your lipid or cholesterol levels are high, you are over age 77, or you are at high risk for heart disease, you may need your cholesterol levels checked more frequently.Ongoing high lipid and cholesterol levels should be treated with medicines if diet and exercise are not working.  If you smoke, find out from your health care provider how to quit. If you do not use tobacco, do not start.  Lung cancer screening is recommended for adults aged 65-80 years who are at high risk for developing lung cancer because of a history of smoking. A yearly low-dose CT scan of the lungs is recommended for people who have at least a 30-pack-year history of smoking and are current smokers or have quit within the past 15 years. A pack year of smoking is smoking an average of 1 pack of cigarettes a day for 1 year (for example, a 30-pack-year history of smoking could mean smoking 1 pack a day for 30 years or 2 packs a day for 15 years). Yearly screening should continue until the smoker has stopped smoking for at least 15 years. Yearly screening should be stopped for people who develop a health problem that would prevent them from having lung cancer treatment.  If you choose to drink alcohol, do not have more than 2 drinks per day. One drink is considered to be 12  oz (360 mL) of beer, 5 oz (150 mL) of wine, or 1.5 oz (45 mL) of liquor.  Avoid the use of street drugs. Do not share needles with anyone. Ask for help if you need support or instructions about stopping the use of drugs.  High blood pressure causes heart disease and increases the risk of stroke. High blood pressure is more likely to develop in:  People who have blood pressure in the end of the normal range (100-139/85-89 mm Hg).  People who are overweight or obese.  People who are African American.  If you are 43-63 years of age,  have your blood pressure checked every 3-5 years. If you are 53 years of age or older, have your blood pressure checked every year. You should have your blood pressure measured twice--once when you are at a hospital or clinic, and once when you are not at a hospital or clinic. Record the average of the two measurements. To check your blood pressure when you are not at a hospital or clinic, you can use:  An automated blood pressure machine at a pharmacy.  A home blood pressure monitor.  If you are 35-26 years old, ask your health care provider if you should take aspirin to prevent heart disease.  Diabetes screening involves taking a blood sample to check your fasting blood sugar level. This should be done once every 3 years after age 85 if you are at a normal weight and without risk factors for diabetes. Testing should be considered at a younger age or be carried out more frequently if you are overweight and have at least 1 risk factor for diabetes.  Colorectal cancer can be detected and often prevented. Most routine colorectal cancer screening begins at the age of 71 and continues through age 54. However, your health care provider may recommend screening at an earlier age if you have risk factors for colon cancer. On a yearly basis, your health care provider may provide home test kits to check for hidden blood in the stool. A small camera at the end of a tube may be used to directly examine the colon (sigmoidoscopy or colonoscopy) to detect the earliest forms of colorectal cancer. Talk to your health care provider about this at age 98 when routine screening begins. A direct exam of the colon should be repeated every 5-10 years through age 27, unless early forms of precancerous polyps or small growths are found.  People who are at an increased risk for hepatitis B should be screened for this virus. You are considered at high risk for hepatitis B if:  You were born in a country where hepatitis B occurs  often. Talk with your health care provider about which countries are considered high risk.  Your parents were born in a high-risk country and you have not received a shot to protect against hepatitis B (hepatitis B vaccine).  You have HIV or AIDS.  You use needles to inject street drugs.  You live with, or have sex with, someone who has hepatitis B.  You are a man who has sex with other men (MSM).  You get hemodialysis treatment.  You take certain medicines for conditions like cancer, organ transplantation, and autoimmune conditions.  Hepatitis C blood testing is recommended for all people born from [birth year] through 1964 and any individual with known risk factors for hepatitis C.  Healthy men should no longer receive prostate-specific antigen (PSA) blood tests as part of routine cancer screening. Talk to your health  care provider about prostate cancer screening.  Testicular cancer screening is not recommended for adolescents or adult males who have no symptoms. Screening includes self-exam, a health care provider exam, and other screening tests. Consult with your health care provider about any symptoms you have or any concerns you have about testicular cancer.  Practice safe sex. Use condoms and avoid high-risk sexual practices to reduce the spread of sexually transmitted infections (STIs).  You should be screened for STIs, including gonorrhea and chlamydia if:  You are sexually active and are younger than 24 years.  You are older than 24 years, and your health care provider tells you that you are at risk for this type of infection.  Your sexual activity has changed since you were last screened, and you are at an increased risk for chlamydia or gonorrhea. Ask your health care provider if you are at risk.  If you are at risk of being infected with HIV, it is recommended that you take a prescription medicine daily to prevent HIV infection. This is called pre-exposure prophylaxis (PrEP). You  are considered at risk if:  You are a man who has sex with other men (MSM).  You are a heterosexual man who is sexually active with multiple partners.  You take drugs by injection.  You are sexually active with a partner who has HIV.  Talk with your health care provider about whether you are at high risk of being infected with HIV. If you choose to begin PrEP, you should first be tested for HIV. You should then be tested every 3 months for as long as you are taking PrEP.  Use sunscreen. Apply sunscreen liberally and repeatedly throughout the day. You should seek shade when your shadow is shorter than you. Protect yourself by wearing long sleeves, pants, a wide-brimmed hat, and sunglasses year round whenever you are outdoors.  Tell your health care provider of new moles or changes in moles, especially if there is a change in shape or color. Also, tell your health care provider if a mole is larger than the size of a pencil eraser.  A one-time screening for abdominal aortic aneurysm (AAA) and surgical repair of large AAAs by ultrasound is recommended for men aged 64-75 years who are current or former smokers.  Stay current with your vaccines (immunizations).   This information is not intended to replace advice given to you by your health care provider. Make sure you discuss any questions you have with your health care provider.   Document Released: 10/11/2007 Document Revised: 05/05/2014 Document Reviewed: 09/09/2010 Elsevier Interactive Patient Education [employer].

## 2015-06-12 NOTE — Assessment & Plan Note (Addendum)
By imaging CT lung 04/2013. Not on statin. Continue aspirin. LDL at goal <100

## 2016-03-24 DIAGNOSIS — M7732 Calcaneal spur, left foot: Secondary | ICD-10-CM | POA: Diagnosis not present

## 2016-03-24 DIAGNOSIS — M76822 Posterior tibial tendinitis, left leg: Secondary | ICD-10-CM | POA: Diagnosis not present

## 2016-03-24 DIAGNOSIS — M71572 Other bursitis, not elsewhere classified, left ankle and foot: Secondary | ICD-10-CM | POA: Diagnosis not present

## 2016-03-24 DIAGNOSIS — M722 Plantar fascial fibromatosis: Secondary | ICD-10-CM | POA: Diagnosis not present

## 2016-03-31 DIAGNOSIS — M722 Plantar fascial fibromatosis: Secondary | ICD-10-CM | POA: Diagnosis not present

## 2016-03-31 DIAGNOSIS — M71572 Other bursitis, not elsewhere classified, left ankle and foot: Secondary | ICD-10-CM | POA: Diagnosis not present

## 2016-04-07 ENCOUNTER — Ambulatory Visit: Payer: Self-pay | Admitting: Podiatry

## 2016-05-26 DIAGNOSIS — H0259 Other disorders affecting eyelid function: Secondary | ICD-10-CM | POA: Diagnosis not present

## 2016-06-11 ENCOUNTER — Other Ambulatory Visit: Payer: Self-pay | Admitting: Family Medicine

## 2016-06-11 DIAGNOSIS — N400 Enlarged prostate without lower urinary tract symptoms: Secondary | ICD-10-CM

## 2016-06-11 DIAGNOSIS — K760 Fatty (change of) liver, not elsewhere classified: Secondary | ICD-10-CM

## 2016-06-11 DIAGNOSIS — E785 Hyperlipidemia, unspecified: Secondary | ICD-10-CM

## 2016-06-13 ENCOUNTER — Ambulatory Visit (INDEPENDENT_AMBULATORY_CARE_PROVIDER_SITE_OTHER): Payer: PPO

## 2016-06-13 ENCOUNTER — Other Ambulatory Visit (INDEPENDENT_AMBULATORY_CARE_PROVIDER_SITE_OTHER): Payer: PPO

## 2016-06-13 VITALS — BP 126/80 | HR 65 | Temp 97.7°F | Ht 66.0 in | Wt 197.2 lb

## 2016-06-13 DIAGNOSIS — N400 Enlarged prostate without lower urinary tract symptoms: Secondary | ICD-10-CM

## 2016-06-13 DIAGNOSIS — E785 Hyperlipidemia, unspecified: Secondary | ICD-10-CM | POA: Diagnosis not present

## 2016-06-13 DIAGNOSIS — Z125 Encounter for screening for malignant neoplasm of prostate: Secondary | ICD-10-CM

## 2016-06-13 DIAGNOSIS — K760 Fatty (change of) liver, not elsewhere classified: Secondary | ICD-10-CM

## 2016-06-13 DIAGNOSIS — Z0000 Encounter for general adult medical examination without abnormal findings: Secondary | ICD-10-CM

## 2016-06-13 DIAGNOSIS — R300 Dysuria: Secondary | ICD-10-CM | POA: Diagnosis not present

## 2016-06-13 LAB — COMPREHENSIVE METABOLIC PANEL
ALBUMIN: 4.3 g/dL (ref 3.5–5.2)
ALT: 26 U/L (ref 0–53)
AST: 26 U/L (ref 0–37)
Alkaline Phosphatase: 165 U/L — ABNORMAL HIGH (ref 39–117)
BILIRUBIN TOTAL: 0.7 mg/dL (ref 0.2–1.2)
BUN: 12 mg/dL (ref 6–23)
CALCIUM: 9.4 mg/dL (ref 8.4–10.5)
CO2: 32 meq/L (ref 19–32)
CREATININE: 1.14 mg/dL (ref 0.40–1.50)
Chloride: 105 mEq/L (ref 96–112)
GFR: 66.64 mL/min (ref >60.00)
Glucose, Bld: 95 mg/dL (ref 70–99)
Potassium: 4.2 mEq/L (ref 3.5–5.1)
Sodium: 143 mEq/L (ref 135–145)
Total Protein: 7.3 g/dL (ref 6.0–8.3)

## 2016-06-13 LAB — LIPID PANEL
CHOL/HDL RATIO: 6
CHOLESTEROL: 152 mg/dL (ref 0–200)
HDL: 27.1 mg/dL — AB (ref >39.00)
LDL Cholesterol: 88 mg/dL (ref 0–99)
NonHDL: 125.24
TRIGLYCERIDES: 186 mg/dL — AB (ref 0.0–149.0)
VLDL: 37.2 mg/dL (ref 0.0–40.0)

## 2016-06-13 LAB — POC URINALSYSI DIPSTICK (AUTOMATED)
BILIRUBIN UA: NEGATIVE
Blood, UA: NEGATIVE
Glucose, UA: NEGATIVE
KETONES UA: NEGATIVE
LEUKOCYTES UA: NEGATIVE
NITRITE UA: NEGATIVE
PH UA: 7.5
PROTEIN UA: NEGATIVE
SPEC GRAV UA: 1.015
UROBILINOGEN UA: 0.2

## 2016-06-13 LAB — PSA, MEDICARE: PSA: 66.63 ng/mL — AB (ref 0.10–4.00)

## 2016-06-13 NOTE — Progress Notes (Signed)
Pre visit review using our clinic review tool, if applicable. No additional management support is needed unless otherwise documented below in the visit note. 

## 2016-06-13 NOTE — Addendum Note (Signed)
Addended by: Ellamae Sia on: 06/13/2016 08:37 AM   Modules accepted: Orders

## 2016-06-13 NOTE — Progress Notes (Addendum)
I reviewed health advisor's note, was available for consultation, and agree with documentation and plan.  I asked to collect urinalysis. I don't see one ordered. Will check with Lesia.   ADDENDUM ==> UA reviewed, normal. Given elevated PSA, UCx sent. See result note for plan.

## 2016-06-13 NOTE — Progress Notes (Signed)
UA completed as ordered. Results sent to PCP for review.   Per PCP, urine culture ordered.

## 2016-06-13 NOTE — Progress Notes (Signed)
PCP notes:   Health maintenance:  No gaps identified.  Abnormal screenings:   Hearing - failed  Patient concerns:   Pt has complaint of intermittent burning upon urination. Onset: around Apr 21, 2016. Pt denies odor and discharge. PCP notified. UA ordered.   Nurse concerns:  None  Next PCP appt:   06/17/16 @ 0830

## 2016-06-13 NOTE — Progress Notes (Signed)
Subjective:   Justin Ali is a 76 y.o. male who presents for Medicare Annual/Subsequent preventive examination.  Review of Systems:  N/A Cardiac Risk Factors include: advanced age (>50men, >79 women);male gender;obesity (BMI >30kg/m2);dyslipidemia;hypertension;smoking/ tobacco exposure     Objective:    Vitals: BP 126/80 (BP Location: Right Arm, Patient Position: Sitting, Cuff Size: Normal)   Pulse 65   Temp 97.7 F (36.5 C) (Oral)   Ht 5\' 6"  (1.676 m) Comment: no shoes  Wt 197 lb 4 oz (89.5 kg)   SpO2 96%   BMI 31.84 kg/m   Body mass index is 31.84 kg/m.  Tobacco History  Smoking Status  . Former Smoker  . Types: Cigarettes, Cigars  . Quit date: 04/28/2002  Smokeless Tobacco  . Never Used    Comment: rare cigar     Counseling given: No   Past Medical History:  Diagnosis Date  . 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 CAD (coronary artery disease), native coronary artery 2014   By CT lung (04/2013)   . Cataract   . HLD (hyperlipidemia)   . HTN (hypertension)   . Hyperglycemia   . NAFLD (nonalcoholic fatty liver disease) 08/2011   by abd [place] with increased LFTs  . OSA (obstructive sleep apnea)    did not tolerate CPAP  . Other benign neoplasm of connective and other soft tissue of unspecified site    Neurofibroma of lateral periorbital area  . Peripheral neuropathy ([place])   . Personal history of colonic adenomas 08/05/2012  . Pneumonia 01/2013   CAP LUL  . Restless legs syndrome (RLS)   . Sleep apnea    no cpap   Past Surgical History:  Procedure Laterality Date  . COLONOSCOPY  07/2012   5 adenomas (tubular, TV, serrated), rec rpt 5 months [name])  . COLONOSCOPY  02/2013   residual adenomas, cecal AVM, rec rpt 1 yr [name])  . COLONOSCOPY  03/2014   no residual polyp, cecal AVM, rpt 3-4 yrs [name])  . ORIF ANKLE FRACTURE  11/29/00   Dr. Tamala Julian  . SKIN CANCER EXCISION  1997   left anterior neck    Family History  Problem Relation Age of Onset  . Cancer Father     liver  . Alcohol abuse Father   . Liver cancer Father   . Cancer Mother     breast  . Breast cancer Mother   . Hypertension Brother     Half-brother  . Coronary artery disease Paternal Aunt     CABG  . Cancer Other     GM; Aunt - breast  . Colon cancer Neg Hx   . Rectal cancer Neg Hx   . Stomach cancer Neg Hx   . Esophageal cancer Neg Hx    History  Sexual Activity  . Sexual activity: Not on file    Outpatient Encounter Prescriptions as of 06/13/2016  Medication Sig  . amLODipine (NORVASC) 5 MG tablet Take 1 tablet (5 mg total) by mouth daily.  Marland Kitchen aspirin EC 81 MG tablet Take 1 tablet (81 mg total) by mouth daily.  . cholecalciferol (VITAMIN D) 1000 UNITS tablet Take 2,000 Units by mouth daily.   . fish oil-omega-3 fatty acids 1000 MG capsule Take 2 g by mouth daily.   Marland Kitchen gabapentin (NEURONTIN) 300 MG capsule Take 1 capsule (300 mg total) by mouth at bedtime.  Marland Kitchen lisinopril (PRINIVIL,ZESTRIL) 5 MG tablet Take 1 tablet (5 mg  total) by mouth daily.  . Melatonin 10 MG CAPS Take 1 capsule by mouth as needed.  . Multiple Vitamins-Minerals (MULTIVITAMIN PO) Take 1 tablet by mouth daily.   No facility-administered encounter medications on file as of 06/13/2016.     Activities of Daily Living In your present state of health, do you have any difficulty performing the following activities: 06/13/2016  Hearing? Y  Vision? N  Difficulty concentrating or making decisions? N  Walking or climbing stairs? N  Dressing or bathing? N  Doing errands, shopping? N  Preparing Food and eating ? N  Using the Toilet? N  In the past six months, have you accidently leaked urine? N  Do you have problems with loss of bowel control? N  Managing your Medications? N  Managing your Finances? N  Housekeeping or managing your Housekeeping? N  Some recent data might be hidden    Patient Care Team: Ria Bush, MD as PCP -  General (Family Medicine) Birder Robson, MD as Referring Physician (Ophthalmology) Cristal Deer, DPM as Referring Physician (Podiatry)   Assessment:     Hearing Screening   125Hz  250Hz  500Hz  1000Hz  2000Hz  3000Hz  4000Hz  6000Hz  8000Hz   Right ear:   0 0 40  40    Left ear:   40 40 40  0    Vision Screening Comments: Last vision exam in Jan 2018   Exercise Activities and Dietary recommendations Current Exercise Habits: Home exercise routine, Type of exercise: strength training/weights, Time (Minutes): > 60 (90 min), Frequency (Times/Week): 3, Weekly Exercise (Minutes/Week): 0, Intensity: Moderate, Exercise limited by: None identified  Goals    . Increase physical activity          Starting 06/13/16, I will continue to exercise at least 90 min 3 days per week.       Fall Risk Fall Risk  06/13/2016 06/12/2015 06/07/2014 06/02/2013 06/01/2012  Falls in the past year? No No No No No   Depression Screen PHQ 2/9 Scores 06/13/2016 06/12/2015 06/07/2014 06/02/2013  PHQ - 2 Score 0 0 0 0    Cognitive Function MMSE - Mini Mental State Exam 06/13/2016  Orientation to time 5  Orientation to Place 5  Registration 3  Attention/ Calculation 0  Recall 3  Language- name 2 objects 0  Language- repeat 1  Language- follow 3 step command 3  Language- read & follow direction 0  Write a sentence 0  Copy design 0  Total score 20     PLEASE NOTE: A Mini-Cog screen was completed. Maximum score is 20. A value of 0 denotes this part of Folstein MMSE was not completed or the patient failed this part of the Mini-Cog screening.   Mini-Cog Screening Orientation to Time - Max 5 pts Orientation to Place - Max 5 pts Registration - Max 3 pts Recall - Max 3 pts Language Repeat - Max 1 pts Language Follow 3 Step Command - Max 3 pts     Immunization History  Administered Date(s) Administered  . Influenza Whole 01/24/2008, 01/08/2009, 02/08/2013  . Influenza, Seasonal, Injecte, Preservative Fre  01/04/2014  . Influenza,inj,Quad PF,36+ Mos 01/02/2015, 01/09/2016  . Pneumococcal Conjugate-13 06/02/2013  . Pneumococcal Polysaccharide-23 06/12/2010  . Td 08/27/2002, 04/28/2010   Screening Tests Health Maintenance  Topic Date Due  . DTaP/Tdap/Td (1 - Tdap) 04/28/2020 (Originally 04/29/2010)  . COLONOSCOPY  03/22/2017  . TETANUS/TDAP  04/28/2020  . INFLUENZA VACCINE  Addressed  . ZOSTAVAX  Addressed  . PNA vac Low Risk Adult  Completed      Plan:     I have personally reviewed and addressed the Medicare Annual Wellness questionnaire and have noted the following in the patient's chart:  A. Medical and social history B. Use of alcohol, tobacco or illicit drugs  C. Current medications and supplements D. Functional ability and status E.  Nutritional status F.  Physical activity G. Advance directives H. List of other physicians I.  Hospitalizations, surgeries, and ER visits in previous 12 months J.  [place] to include hearing, vision, cognitive, depression L. Referrals and appointments - none  In addition, I have reviewed and discussed with patient certain preventive protocols, quality metrics, and best practice recommendations. A written personalized care plan for preventive services as well as general preventive health recommendations were provided to patient.  See attached scanned questionnaire for additional information.   Signed,   Lindell Noe, MHA, BS, LPN Health Coach

## 2016-06-13 NOTE — Patient Instructions (Signed)
Justin Ali , Thank you for taking time to come for your Medicare Wellness Visit. I appreciate your ongoing commitment to your health goals. Please review the following plan we discussed and let me know if I can assist you in the future.   These are the goals we discussed: Goals    . Increase physical activity          Starting 06/13/16, I will continue to exercise at least 90 min 3 days per week.        This is a list of the screening recommended for you and due dates:  Health Maintenance  Topic Date Due  . DTaP/Tdap/Td vaccine (1 - Tdap) 04/28/2020*  . Colon Cancer Screening  03/22/2017  . Tetanus Vaccine  04/28/2020  . Flu Shot  Addressed  . Shingles Vaccine  Addressed  . Pneumonia vaccines  Completed  *Topic was postponed. The date shown is not the original due date.   Preventive Care for Adults  A healthy lifestyle and preventive care can promote health and wellness. Preventive health guidelines for adults include the following key practices.  . A routine yearly physical is a good way to check with your health care provider about your health and preventive screening. It is a chance to share any concerns and updates on your health and to receive a thorough exam.  . Visit your dentist for a routine exam and preventive care every 6 months. Brush your teeth twice a day and floss once a day. Good oral hygiene prevents tooth decay and gum disease.  . The frequency of eye exams is based on your age, health, family medical history, use  of contact lenses, and other factors. Follow your health care provider's ecommendations for frequency of eye exams.  . Eat a healthy diet. Foods like vegetables, fruits, whole grains, low-fat dairy products, and lean protein foods contain the nutrients you need without too many calories. Decrease your intake of foods high in solid fats, added sugars, and salt. Eat the right amount of calories for you. Get information about a proper diet from your health  care provider, if necessary.  . Regular physical exercise is one of the most important things you can do for your health. Most adults should get at least 150 minutes of moderate-intensity exercise (any activity that increases your heart rate and causes you to sweat) each week. In addition, most adults need muscle-strengthening exercises on 2 or more days a week.  Silver Sneakers may be a benefit available to you. To determine eligibility, you may visit the website: www.silversneakers.com or contact program at 857-828-9684 Mon-Fri between 8AM-8PM.   . Maintain a healthy weight. The body mass index (BMI) is a screening tool to identify possible weight problems. It provides an estimate of body fat based on height and weight. Your health care provider can find your BMI and can help you achieve or maintain a healthy weight.   For adults 20 years and older: ? A BMI below 18.5 is considered underweight. ? A BMI of 18.5 to 24.9 is normal. ? A BMI of 25 to 29.9 is considered overweight. ? A BMI of 30 and above is considered obese.   . Maintain normal blood lipids and cholesterol levels by exercising and minimizing your intake of saturated fat. Eat a balanced diet with plenty of fruit and vegetables. Blood tests for lipids and cholesterol should begin at age 93 and be repeated every 5 years. If your lipid or cholesterol levels are high, you  are over 90, or you are at high risk for heart disease, you may need your cholesterol levels checked more frequently. Ongoing high lipid and cholesterol levels should be treated with medicines if diet and exercise are not working.  . If you smoke, find out from your health care provider how to quit. If you do not use tobacco, please do not start.  . If you choose to drink alcohol, please do not consume more than 2 drinks per day. One drink is considered to be 12 ounces (355 mL) of beer, 5 ounces (148 mL) of wine, or 1.5 ounces (44 mL) of liquor.  . If you are 10-75  years old, ask your health care provider if you should take aspirin to prevent strokes.  . Use sunscreen. Apply sunscreen liberally and repeatedly throughout the day. You should seek shade when your shadow is shorter than you. Protect yourself by wearing long sleeves, pants, a wide-brimmed hat, and sunglasses year round, whenever you are outdoors.  . Once a month, do a whole body skin exam, using a mirror to look at the skin on your back. Tell your health care provider of new moles, moles that have irregular borders, moles that are larger than a pencil eraser, or moles that have changed in shape or color.

## 2016-06-15 LAB — URINE CULTURE: ORGANISM ID, BACTERIA: NO GROWTH

## 2016-06-17 ENCOUNTER — Encounter: Payer: Self-pay | Admitting: Family Medicine

## 2016-06-17 ENCOUNTER — Ambulatory Visit (INDEPENDENT_AMBULATORY_CARE_PROVIDER_SITE_OTHER): Payer: PPO | Admitting: Family Medicine

## 2016-06-17 VITALS — BP 110/72 | HR 64 | Temp 97.8°F | Wt 198.0 lb

## 2016-06-17 DIAGNOSIS — I2510 Atherosclerotic heart disease of native coronary artery without angina pectoris: Secondary | ICD-10-CM

## 2016-06-17 DIAGNOSIS — K5520 Angiodysplasia of colon without hemorrhage: Secondary | ICD-10-CM | POA: Diagnosis not present

## 2016-06-17 DIAGNOSIS — E785 Hyperlipidemia, unspecified: Secondary | ICD-10-CM | POA: Diagnosis not present

## 2016-06-17 DIAGNOSIS — Z7189 Other specified counseling: Secondary | ICD-10-CM

## 2016-06-17 DIAGNOSIS — G4730 Sleep apnea, unspecified: Secondary | ICD-10-CM | POA: Diagnosis not present

## 2016-06-17 DIAGNOSIS — N401 Enlarged prostate with lower urinary tract symptoms: Secondary | ICD-10-CM

## 2016-06-17 DIAGNOSIS — N410 Acute prostatitis: Secondary | ICD-10-CM

## 2016-06-17 DIAGNOSIS — Z0000 Encounter for general adult medical examination without abnormal findings: Secondary | ICD-10-CM | POA: Diagnosis not present

## 2016-06-17 DIAGNOSIS — I10 Essential (primary) hypertension: Secondary | ICD-10-CM | POA: Diagnosis not present

## 2016-06-17 DIAGNOSIS — R972 Elevated prostate specific antigen [PSA]: Secondary | ICD-10-CM | POA: Diagnosis not present

## 2016-06-17 DIAGNOSIS — K760 Fatty (change of) liver, not elsewhere classified: Secondary | ICD-10-CM | POA: Diagnosis not present

## 2016-06-17 DIAGNOSIS — R3916 Straining to void: Secondary | ICD-10-CM

## 2016-06-17 DIAGNOSIS — E669 Obesity, unspecified: Secondary | ICD-10-CM

## 2016-06-17 DIAGNOSIS — N419 Inflammatory disease of prostate, unspecified: Secondary | ICD-10-CM | POA: Insufficient documentation

## 2016-06-17 MED ORDER — GABAPENTIN 300 MG PO CAPS: 300.0000 mg | ORAL_CAPSULE | Freq: Every day | ORAL | 3 refills | Status: DC

## 2016-06-17 MED ORDER — LISINOPRIL 5 MG PO TABS: 5.0000 mg | ORAL_TABLET | Freq: Every day | ORAL | 3 refills | Status: DC

## 2016-06-17 MED ORDER — SULFAMETHOXAZOLE-TRIMETHOPRIM 800-160 MG PO TABS: 1.0000 | ORAL_TABLET | Freq: Two times a day (BID) | ORAL | 0 refills | Status: DC

## 2016-06-17 MED ORDER — AMLODIPINE BESYLATE 5 MG PO TABS: 5.0000 mg | ORAL_TABLET | Freq: Every day | ORAL | 3 refills | Status: DC

## 2016-06-17 NOTE — Assessment & Plan Note (Signed)
Discussed healthy diet and lifestyle changes to affect sustainable weight loss  

## 2016-06-17 NOTE — Patient Instructions (Addendum)
I think you have prostate infection - treat with bactrim twice daily for 2 weeks. Let us know if ongoing symptoms after treatment.  Health care power of attorney form provided today.  Return in 3 months for lab visit only to recheck prostate level.  Good to see you today, call us with questions.   Health Maintenance, Male A healthy lifestyle and preventative care can promote health and wellness.  Maintain regular health, dental, and eye exams.  Eat a healthy diet. Foods like vegetables, fruits, whole grains, low-fat dairy products, and lean protein foods contain the nutrients you need and are low in calories. Decrease your intake of foods high in solid fats, added sugars, and salt. Get information about a proper diet from your health care provider, if necessary.  Regular physical exercise is one of the most important things you can do for your health. Most adults should get at least 150 minutes of moderate-intensity exercise (any activity that increases your heart rate and causes you to sweat) each week. In addition, most adults need muscle-strengthening exercises on 2 or more days a week.   Maintain a healthy weight. The body mass index (BMI) is a screening tool to identify possible weight problems. It provides an estimate of body fat based on height and weight. Your health care provider can find your BMI and can help you achieve or maintain a healthy weight. For males 20 years and older:  A BMI below 18.5 is considered underweight.  A BMI of 18.5 to 24.9 is normal.  A BMI of 25 to 29.9 is considered overweight.  A BMI of 30 and above is considered obese.  Maintain normal blood lipids and cholesterol by exercising and minimizing your intake of saturated fat. Eat a balanced diet with plenty of fruits and vegetables. Blood tests for lipids and cholesterol should begin at age 65 and be repeated every 5 years. If your lipid or cholesterol levels are high, you are over age 30, or you are at high  risk for heart disease, you may need your cholesterol levels checked more frequently.Ongoing high lipid and cholesterol levels should be treated with medicines if diet and exercise are not working.  If you smoke, find out from your health care provider how to quit. If you do not use tobacco, do not start.  Lung cancer screening is recommended for adults aged 36-80 years who are at high risk for developing lung cancer because of a history of smoking. A yearly low-dose CT scan of the lungs is recommended for people who have at least a 30-pack-year history of smoking and are current smokers or have quit within the past 15 years. A pack year of smoking is smoking an average of 1 pack of cigarettes a day for 1 year (for example, a 30-pack-year history of smoking could mean smoking 1 pack a day for 30 years or 2 packs a day for 15 years). Yearly screening should continue until the smoker has stopped smoking for at least 15 years. Yearly screening should be stopped for people who develop a health problem that would prevent them from having lung cancer treatment.  If you choose to drink alcohol, do not have more than 2 drinks per day. One drink is considered to be 12 oz (360 mL) of beer, 5 oz (150 mL) of wine, or 1.5 oz (45 mL) of liquor.  Avoid the use of street drugs. Do not share needles with anyone. Ask for help if you need support or instructions about  stopping the use of drugs.  High blood pressure causes heart disease and increases the risk of stroke. High blood pressure is more likely to develop in:  People who have blood pressure in the end of the normal range (100-139/85-89 mm Hg).  People who are overweight or obese.  People who are African American.  If you are 75-30 years of age, have your blood pressure checked every 3-5 years. If you are 80 years of age or older, have your blood pressure checked every year. You should have your blood pressure measured twice-once when you are at a hospital  or clinic, and once when you are not at a hospital or clinic. Record the average of the two measurements. To check your blood pressure when you are not at a hospital or clinic, you can use:  An automated blood pressure machine at a pharmacy.  A home blood pressure monitor.  If you are 46-30 years old, ask your health care provider if you should take aspirin to prevent heart disease.  Diabetes screening involves taking a blood sample to check your fasting blood sugar level. This should be done once every 3 years after age 71 if you are at a normal weight and without risk factors for diabetes. Testing should be considered at a younger age or be carried out more frequently if you are overweight and have at least 1 risk factor for diabetes.  Colorectal cancer can be detected and often prevented. Most routine colorectal cancer screening begins at the age of 46 and continues through age 17. However, your health care provider may recommend screening at an earlier age if you have risk factors for colon cancer. On a yearly basis, your health care provider may provide home test kits to check for hidden blood in the stool. A small camera at the end of a tube may be used to directly examine the colon (sigmoidoscopy or colonoscopy) to detect the earliest forms of colorectal cancer. Talk to your health care provider about this at age 15 when routine screening begins. A direct exam of the colon should be repeated every 5-10 years through age 47, unless early forms of precancerous polyps or small growths are found.  People who are at an increased risk for hepatitis B should be screened for this virus. You are considered at high risk for hepatitis B if:  You were born in a country where hepatitis B occurs often. Talk with your health care provider about which countries are considered high risk.  Your parents were born in a high-risk country and you have not received a shot to protect against hepatitis B (hepatitis B  vaccine).  You have HIV or AIDS.  You use needles to inject street drugs.  You live with, or have sex with, someone who has hepatitis B.  You are a man who has sex with other men (MSM).  You get hemodialysis treatment.  You take certain medicines for conditions like cancer, organ transplantation, and autoimmune conditions.  Hepatitis C blood testing is recommended for all people born from [birth year] through 1964 and any individual with known risk factors for hepatitis C.  Healthy men should no longer receive prostate-specific antigen (PSA) blood tests as part of routine cancer screening. Talk to your health care provider about prostate cancer screening.  Testicular cancer screening is not recommended for adolescents or adult males who have no symptoms. Screening includes self-exam, a health care provider exam, and other screening tests. Consult with your health care provider about  any symptoms you have or any concerns you have about testicular cancer.  Practice safe sex. Use condoms and avoid high-risk sexual practices to reduce the spread of sexually transmitted infections (STIs).  You should be screened for STIs, including gonorrhea and chlamydia if:  You are sexually active and are younger than 24 years.  You are older than 24 years, and your health care provider tells you that you are at risk for this type of infection.  Your sexual activity has changed since you were last screened, and you are at an increased risk for chlamydia or gonorrhea. Ask your health care provider if you are at risk.  If you are at risk of being infected with HIV, it is recommended that you take a prescription medicine daily to prevent HIV infection. This is called pre-exposure prophylaxis (PrEP). You are considered at risk if:  You are a man who has sex with other men (MSM).  You are a heterosexual man who is sexually active with multiple partners.  You take drugs by injection.  You are sexually active  with a partner who has HIV.  Talk with your health care provider about whether you are at high risk of being infected with HIV. If you choose to begin PrEP, you should first be tested for HIV. You should then be tested every 3 months for as long as you are taking PrEP.  Use sunscreen. Apply sunscreen liberally and repeatedly throughout the day. You should seek shade when your shadow is shorter than you. Protect yourself by wearing long sleeves, pants, a wide-brimmed hat, and sunglasses year round whenever you are outdoors.  Tell your health care provider of new moles or changes in moles, especially if there is a change in shape or color. Also, tell your health care provider if a mole is larger than the size of a pencil eraser.  A one-time screening for abdominal aortic aneurysm (AAA) and surgical repair of large AAAs by ultrasound is recommended for men aged 64-75 years who are current or former smokers.  Stay current with your vaccines (immunizations). This information is not intended to replace advice given to you by your health care provider. Make sure you discuss any questions you have with your health care provider. Document Released: 10/11/2007 Document Revised: 05/05/2014 Document Reviewed: 01/16/2015 Elsevier Interactive Patient Education  2017 [employer].

## 2016-06-17 NOTE — Assessment & Plan Note (Signed)
F/u colonoscopy planned 02/2017 per patient.

## 2016-06-17 NOTE — Assessment & Plan Note (Signed)
UA and Ucx were normal. PSA was elevated. Suspect prostatitis - treat with 2 wk bactrim course. Update if persistent sxs after treatment.

## 2016-06-17 NOTE — Assessment & Plan Note (Signed)
Continue aspirin. Consider statin.  

## 2016-06-17 NOTE — Assessment & Plan Note (Signed)
Chronic, stable. Continue current regimen. 

## 2016-06-17 NOTE — Assessment & Plan Note (Signed)
Chronic, stable off meds. Discussed healthy diet and lifestyle changes to help control lipids

## 2016-06-17 NOTE — Assessment & Plan Note (Signed)
H/o this by [place]. Elevated ALP noted today - recheck next visit.

## 2016-06-17 NOTE — Assessment & Plan Note (Addendum)
Advanced directives - Received advanced directives, scanned 05/2015. No prolonged life support if terminal condition. No HCPOA form. Wife would be HCPOA. Wants to be buried in [facility]. HCPOA form provided today.

## 2016-06-17 NOTE — Progress Notes (Signed)
Pre visit review using our clinic review tool, if applicable. No additional management support is needed unless otherwise documented below in the visit note. 

## 2016-06-17 NOTE — Addendum Note (Signed)
Addended by: Ria Bush on: 06/17/2016 09:16 AM   Modules accepted: Orders

## 2016-06-17 NOTE — Progress Notes (Signed)
BP 110/72 (BP Location: Left Arm, Patient Position: Sitting, Cuff Size: Normal)   Pulse 64   Temp 97.8 F (36.6 C) (Oral)   Wt 198 lb (89.8 kg)   SpO2 95%   BMI 31.96 kg/m    CC: CPE Subjective:    Patient ID: Justin Ali, male    DOB: Jan 01, 1941, 76 y.o.   MRN: [id number]  HPI: Justin Ali is a 76 y.o. male presenting on 06/17/2016 for Annual Exam   Saw Katha Cabal last week for medicare wellness visit. Note reviewed. Endorsed intermittent burning over last 2 months. Acute episode of urinary retention around christmas and since then noticing decreased stream strength. See A&P section.   Preventative: COLONOSCOPY Date: 03/2014 no residual polyp, cecal AVM, rpt 3-4 yrs [name])  Prostate - checked yearly.  Lung cancer screening - not eligible.  Flu shot yearly Td - 2012  Pneumovax 2012, prevnar 2015 zostavax - insurance won't cover so not interested. Doesn't think has had chicken pox.  Advanced directives - Received advanced directives, scanned 05/2015. No prolonged life support if terminal condition. No HCPOA form. Wife would be HCPOA. Wants to be [facility] in [facility].  Seat belt use discussed Sunscreen use discussed. No changing moles.  Ex smoker - quit 2004. Mainly cigars Alcohol - 2 shots on weekends   Caffeine: occasional coffee  Lives with wife, 1 cat  Occupation: truck driver Adult nurse)  Activity: goes to [employer] 3x/wk  Diet: rare water, lots of sweet tea, some fruits/vegetables   Relevant past medical, surgical, family and social history reviewed and updated as indicated. Interim medical history since our last visit reviewed. Allergies and medications reviewed and updated.  Outpatient Medications Prior to Visit  Medication Sig Dispense Refill  . aspirin EC 81 MG tablet Take 1 tablet (81 mg total) by mouth daily.    . cholecalciferol (VITAMIN D) 1000 UNITS tablet Take 2,000 Units by mouth daily.     . fish oil-omega-3 fatty acids 1000 MG  capsule Take 2 g by mouth daily.     . Melatonin 10 MG CAPS Take 1 capsule by mouth as needed.    . Multiple Vitamins-Minerals (MULTIVITAMIN PO) Take 1 tablet by mouth daily.    Marland Kitchen amLODipine (NORVASC) 5 MG tablet Take 1 tablet (5 mg total) by mouth daily. 90 tablet 3  . gabapentin (NEURONTIN) 300 MG capsule Take 1 capsule (300 mg total) by mouth at bedtime. 90 capsule 3  . lisinopril (PRINIVIL,ZESTRIL) 5 MG tablet Take 1 tablet (5 mg total) by mouth daily. 90 tablet 3   No facility-administered medications prior to visit.      Per HPI unless specifically indicated in ROS section below Review of Systems  Constitutional: Negative for activity change, appetite change, chills, fatigue, fever and unexpected weight change.  HENT: Negative for hearing loss.   Eyes: Negative for visual disturbance.  Respiratory: Negative for cough, chest tightness, shortness of breath and wheezing.   Cardiovascular: Negative for chest pain, palpitations and leg swelling.  Gastrointestinal: Negative for abdominal distention, abdominal pain, blood in stool, constipation, diarrhea, nausea and vomiting.  Genitourinary: Negative for difficulty urinating and hematuria.  Musculoskeletal: Negative for arthralgias, myalgias and neck pain.  Skin: Negative for rash.  Neurological: Negative for dizziness, seizures, syncope and headaches.  Hematological: Negative for adenopathy. Does not bruise/bleed easily.  Psychiatric/Behavioral: Negative for dysphoric mood. The patient is not nervous/anxious.        Objective:    BP 110/72 (BP Location:  Left Arm, Patient Position: Sitting, Cuff Size: Normal)   Pulse 64   Temp 97.8 F (36.6 C) (Oral)   Wt 198 lb (89.8 kg)   SpO2 95%   BMI 31.96 kg/m   Wt Readings from Last 3 Encounters:  06/17/16 198 lb (89.8 kg)  06/13/16 197 lb 4 oz (89.5 kg)  06/12/15 213 lb (96.6 kg)    Physical Exam  Constitutional: He is oriented to person, place, and time. He appears well-developed  and well-nourished. No distress.  HENT:  Head: Normocephalic and atraumatic.  Right Ear: Hearing, tympanic membrane, external ear and ear canal normal.  Left Ear: Hearing, tympanic membrane, external ear and ear canal normal.  Nose: Nose normal.  Mouth/Throat: Uvula is midline, oropharynx is clear and moist and mucous membranes are normal. No oropharyngeal exudate, posterior oropharyngeal edema or posterior oropharyngeal erythema.  Eyes: Conjunctivae and EOM are normal. Pupils are equal, round, and reactive to light. No scleral icterus.  Neck: Normal range of motion. Neck supple. Carotid bruit is not present. No thyromegaly present.  Cardiovascular: Normal rate, regular rhythm, normal heart sounds and intact distal pulses.   No murmur heard. Pulses:      Radial pulses are 2+ on the right side, and 2+ on the left side.  Pulmonary/Chest: Effort normal and breath sounds normal. No respiratory distress. He has no wheezes. He has no rales.  Abdominal: Soft. Bowel sounds are normal. He exhibits no distension and no mass. There is no tenderness. There is no rebound and no guarding.  Genitourinary: Rectum normal. Rectal exam shows no external hemorrhoid, no internal hemorrhoid, no fissure, no mass, no tenderness and anal tone normal. Prostate is enlarged (40gm). Prostate is not tender.  Genitourinary Comments: Nontender, slightly boggy prostate  Musculoskeletal: Normal range of motion. He exhibits no edema.  Lymphadenopathy:    He has no cervical adenopathy.  Neurological: He is alert and oriented to person, place, and time.  CN grossly intact, station and gait intact  Skin: Skin is warm and dry. No rash noted.  Psychiatric: He has a normal mood and affect. His behavior is normal. Judgment and thought content normal.  Nursing note and vitals reviewed.  Results for orders placed or performed in visit on 06/13/16  Urine culture  Result Value Ref Range   Organism ID, Bacteria NO GROWTH   PSA,  Medicare  Result Value Ref Range   PSA 66.63 (H) 0.10 - 4.00 ng/ml  POCT Urinalysis Dipstick (Automated)  Result Value Ref Range   Color, UA yellow    Clarity, UA cloudy    Glucose, UA negative    Bilirubin, UA negative    Ketones, UA negative    Spec Grav, UA 1.015    Blood, UA negative    pH, UA 7.5    Protein, UA negative    Urobilinogen, UA 0.2    Nitrite, UA negative    Leukocytes, UA Negative Negative      Assessment & Plan:   Problem List Items Addressed This Visit    Advanced care planning/counseling discussion    Advanced directives - Received advanced directives, scanned 05/2015. No prolonged life support if terminal condition. No HCPOA form. Wife would be HCPOA. W[facility] in [facility]. HCPOA form provided today.       Angiodysplasia of cecum    F/u colonoscopy planned 02/2017 per patient.       Relevant Medications   amLODipine (NORVASC) 5 MG tablet   lisinopril (PRINIVIL,ZESTRIL)  5 MG tablet   Benign prostatic hyperplasia    Again noted today. With elevated PSA, ?prostatitis - see above.       CAD (coronary artery disease), native coronary artery    Continue aspirin. Consider statin.       Relevant Medications   amLODipine (NORVASC) 5 MG tablet   lisinopril (PRINIVIL,ZESTRIL) 5 MG tablet   Dyslipidemia    Chronic, stable off meds. Discussed healthy diet and lifestyle changes to help control lipids      Fatty liver    H/o this by [place]. Elevated ALP noted today - recheck next visit.       Health maintenance examination - Primary    Preventative protocols reviewed and updated unless pt declined. Discussed healthy diet and lifestyle.       HYPERTENSION, BENIGN ESSENTIAL    Chronic, stable. Continue current regimen.       Relevant Medications   amLODipine (NORVASC) 5 MG tablet   lisinopril (PRINIVIL,ZESTRIL) 5 MG tablet   Obesity, Class I, BMI 30-34.9    Discussed healthy diet and lifestyle changes to affect sustainable  weight loss.       Prostatitis    UA and Ucx were normal. PSA was elevated. Suspect prostatitis - treat with 2 wk bactrim course. Update if persistent sxs after treatment.       Sleep apnea    Intolerant to sleep apnea.           Follow up plan: Return in about 1 year (around 06/17/2017) for annual exam, prior fasting for blood work, medicare wellness visit.  Ria Bush, MD

## 2016-06-17 NOTE — Assessment & Plan Note (Signed)
Preventative protocols reviewed and updated unless pt declined. Discussed healthy diet and lifestyle.  

## 2016-06-17 NOTE — Assessment & Plan Note (Signed)
Intolerant to sleep apnea.

## 2016-06-17 NOTE — Assessment & Plan Note (Signed)
Again noted today. With elevated PSA, ?prostatitis - see above.

## 2016-09-16 ENCOUNTER — Other Ambulatory Visit: Payer: Self-pay | Admitting: Family Medicine

## 2016-09-16 ENCOUNTER — Other Ambulatory Visit (INDEPENDENT_AMBULATORY_CARE_PROVIDER_SITE_OTHER): Payer: PPO

## 2016-09-16 DIAGNOSIS — R972 Elevated prostate specific antigen [PSA]: Secondary | ICD-10-CM | POA: Diagnosis not present

## 2016-09-16 DIAGNOSIS — N410 Acute prostatitis: Secondary | ICD-10-CM

## 2016-09-16 DIAGNOSIS — N401 Enlarged prostate with lower urinary tract symptoms: Secondary | ICD-10-CM

## 2016-09-16 DIAGNOSIS — R3916 Straining to void: Secondary | ICD-10-CM

## 2016-09-16 LAB — PSA: PSA: 193.89 ng/mL — ABNORMAL HIGH (ref 0.10–4.00)

## 2016-09-18 ENCOUNTER — Encounter: Payer: Self-pay | Admitting: Urology

## 2016-09-18 ENCOUNTER — Telehealth: Payer: Self-pay | Admitting: Radiology

## 2016-09-18 ENCOUNTER — Ambulatory Visit: Payer: PPO | Admitting: Urology

## 2016-09-18 VITALS — BP 146/71 | HR 58 | Ht 66.0 in | Wt 190.0 lb

## 2016-09-18 DIAGNOSIS — R972 Elevated prostate specific antigen [PSA]: Secondary | ICD-10-CM

## 2016-09-18 LAB — URINALYSIS, COMPLETE
BILIRUBIN UA: NEGATIVE
Glucose, UA: NEGATIVE
KETONES UA: NEGATIVE
LEUKOCYTES UA: NEGATIVE
NITRITE UA: NEGATIVE
PH UA: 5.5 (ref 5.0–7.5)
Protein, UA: NEGATIVE
RBC UA: NEGATIVE
SPEC GRAV UA: 1.02 (ref 1.005–1.030)
UUROB: 0.2 mg/dL (ref 0.2–1.0)

## 2016-09-18 NOTE — Progress Notes (Signed)
09/18/2016 3:03 PM   Justin Ali 1940/07/30 [id number]  Referring provider: Ria Bush, MD [address] [place], [place] [address]  Chief Complaint  Patient presents with  . Elevated PSA    New Patient    HPI: 76 year old male who presents today for further evaluation of rising and significantly elevated PSA.    He was noted to have an incidentally elevated PSA to 66.63 in 05/2016 up significantly from his baseline of approximately 2 over the past several years. Her his PCP note, he was having urinary symptoms around that same time although his UA was negative as well as culture. He is treated for presumed prostatitis with 2 weeks of Bactrim. More recently, PSA was repeated on 09/16/2016 which had risen to 193.  He denies a family history of prostate cancer. Prior to this year, his PSA has never been elevated in the past. He has never seen a urologist.  He denies any urinary symptoms other than a slight decrease in his urinary stream. No urinary urgency, frequency, dysuria, gross hematuria, or significant nocturia. He is satisfied with his voiding symptoms.  He denies any bone pain.  He and his wife have been frequenting the gym and trying to lose weight, has had an approximately 10 pound been volitional weight loss over the past 6 months.   PMH: Past Medical History:  Diagnosis Date  . 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 CAD (coronary artery disease), native coronary artery 2014   By CT lung (04/2013)   . Cataract   . HLD (hyperlipidemia)   . HTN (hypertension)   . Hyperglycemia   . NAFLD (nonalcoholic fatty liver disease) 08/2011   by abd [place] with increased LFTs  . OSA (obstructive sleep apnea)    did not tolerate CPAP  . Other benign neoplasm of connective and other soft tissue of unspecified site    Neurofibroma of lateral periorbital area  . Peripheral neuropathy   . Personal history of colonic adenomas  08/05/2012  . Pneumonia 01/2013   CAP LUL  . Restless legs syndrome (RLS)   . Sleep apnea    no cpap    Surgical History: Past Surgical History:  Procedure Laterality Date  . COLONOSCOPY  07/2012   5 adenomas (tubular, TV, serrated), rec rpt 5 months [name])  . COLONOSCOPY  02/2013   residual adenomas, cecal AVM, rec rpt 1 yr [name])  . COLONOSCOPY  03/2014   no residual polyp, cecal AVM, rpt 3-4 yrs [name])  . ORIF ANKLE FRACTURE  11/29/00   Dr. Tamala Julian  . SKIN CANCER EXCISION  1997   left anterior neck    Home Medications:  Allergies as of 09/18/2016   No Known Allergies     Medication List       Accurate as of 09/18/16  3:03 PM. Always use your most recent med list.          amLODipine 5 MG tablet Commonly known as:  NORVASC Take 1 tablet (5 mg total) by mouth daily.   aspirin EC 81 MG tablet Take 1 tablet (81 mg total) by mouth daily.   cholecalciferol 1000 units tablet Commonly known as:  VITAMIN D Take 2,000 Units by mouth daily.   fish oil-omega-3 fatty acids 1000 MG capsule Take 2 g by mouth daily.   gabapentin 300 MG capsule Commonly known as:  NEURONTIN Take 1 capsule (300 mg total) by mouth at bedtime.  lisinopril 5 MG tablet Commonly known as:  PRINIVIL,ZESTRIL Take 1 tablet (5 mg total) by mouth daily.   Melatonin 10 MG Caps Take 1 capsule by mouth as needed.   MULTIVITAMIN PO Take 1 tablet by mouth daily.       Allergies: No Known Allergies  Family History: Family History  Problem Relation Age of Onset  . Cancer Father        liver  . Alcohol abuse Father   . Liver cancer Father   . Cancer Mother        breast  . Breast cancer Mother   . Hypertension Brother        Half-brother  . Coronary artery disease Paternal Aunt        CABG  . Cancer Other        GM; Aunt - breast  . Colon cancer Neg Hx   . Rectal cancer Neg Hx   . Stomach cancer Neg Hx   . Esophageal cancer Neg Hx     Social History:  reports that he quit  smoking about 14 years ago. His smoking use included Cigarettes and Cigars. He has never used smokeless tobacco. He reports that he drinks about 2.4 oz of alcohol per week . He reports that he does not use drugs.  ROS: UROLOGY Frequent Urination?: No Hard to postpone urination?: No Burning/pain with urination?: No Get up at night to urinate?: No Leakage of urine?: No Urine stream starts and stops?: No Trouble starting stream?: Yes Do you have to strain to urinate?: No Blood in urine?: No Urinary tract infection?: No Sexually transmitted disease?: No Injury to kidneys or bladder?: No Painful intercourse?: No Weak stream?: No Erection problems?: Yes Penile pain?: No  Gastrointestinal Nausea?: No Vomiting?: No Indigestion/heartburn?: No Diarrhea?: No Constipation?: No  Constitutional Fever: No Night sweats?: No Weight loss?: Yes Fatigue?: No  Skin Skin rash/lesions?: No Itching?: No  Eyes Blurred vision?: No Double vision?: No  Ears/Nose/Throat Sore throat?: No Sinus problems?: No  Hematologic/Lymphatic Swollen glands?: No Easy bruising?: No  Cardiovascular Leg swelling?: No Chest pain?: No  Respiratory Cough?: No Shortness of breath?: No  Endocrine Excessive thirst?: No  Musculoskeletal Back pain?: No Joint pain?: No  Neurological Headaches?: No Dizziness?: No  Psychologic Depression?: No Anxiety?: No  Physical Exam: BP (!) 146/71   Pulse (!) 58   Ht 5\' 6"  (1.676 m)   Wt 190 lb (86.2 kg)   BMI 30.67 kg/m   Constitutional:  Alert and oriented, No acute distress.  Accompanied by wife today. HEENT:  AT, moist mucus membranes.  Trachea midline, no masses.  Orbital irregularity appreciated on left Cardiovascular: No clubbing, cyanosis, or edema. Respiratory: Normal respiratory effort, no increased work of breathing. GI: Abdomen is soft, nontender, nondistended, no abdominal masses GU: No CVA tenderness.  Rectal: Normal sphincter tone.  40 cc prostate, nontender, no nodules, diffuse firmness but not grossly abnormal. Skin: No rashes, bruises or suspicious lesions. Neurologic: Grossly intact, no focal deficits, moving all 4 extremities. Psychiatric: Normal mood and affect.  Laboratory Data: Lab Results  Component Value Date   WBC 6.6 06/07/2015   HGB 14.6 06/07/2015   HCT 43.4 06/07/2015   MCV 89.8 06/07/2015   PLT 194.0 06/07/2015    Lab Results  Component Value Date   CREATININE 1.14 06/13/2016    Component     Latest Ref Rng & Units 05/24/2012 05/26/2013 05/29/2014 06/07/2015  PSA     0.10 - 4.00 ng/mL 2.89  2.50 1.34 2.21   Component     Latest Ref Rng & Units 06/13/2016 09/16/2016  PSA     0.10 - 4.00 ng/mL 66.63 (H) 193.89 Repeated and verified X2. (H)     Urinalysis Results for orders placed or performed in visit on 09/18/16  Urinalysis, Complete  Result Value Ref Range   Specific Gravity, UA 1.020 1.005 - 1.030   pH, UA 5.5 5.0 - 7.5   Color, UA Yellow Yellow   Appearance Ur Clear Clear   Leukocytes, UA Negative Negative   Protein, UA Negative Negative/Trace   Glucose, UA Negative Negative   Ketones, UA Negative Negative   RBC, UA Negative Negative   Bilirubin, UA Negative Negative   Urobilinogen, Ur 0.2 0.2 - 1.0 mg/dL   Nitrite, UA Negative Negative    Pertinent Imaging: N/a  Assessment & Plan:    1. Elevated PSA Markedly elevating and rising PSA highly concerning for high-grade aggressive prostate cancer with rapid doubling time.     We reviewed the implications of an elevated PSA and the uncertainty surrounding it. In general, a man's PSA increases with age and is produced by both normal and cancerous prostate tissue. Differential for elevated PSA is BPH, prostate cancer, infection, recent intercourse/ejaculation, prostate infarction, recent urethroscopic manipulation (foley placement/cystoscopy) and prostatitis. Management of an elevated PSA can include observation or prostate biopsy and  wediscussed this in detail.    Although benign etiologies can make PSA rise, this marked elevation is rarely caused by inflammation and is highly concerning.  I recommended a prostate biopsy ASAP. He is currently on ASA 81 mg, will hold starting today and biopsy next week after aspirin has been stopped for at  5 days.  We discussed prostate biopsy in detail including the procedure itself, the risks of blood in the urine, stool, and ejaculate, serious infection, and discomfort. He is willing to proceed with this as discussed.  In addition, I have recommended CT abdomen and pelvis as well as bone scan for staging.  Given the rapid PSA doubling time, we will work diligently to expedite his workup.  - Urinalysis, Complete  Return next week for prostate biopsy  Hollice Espy, MD  [facility] [address] [place], [place] [address] [phone]

## 2016-09-18 NOTE — Telephone Encounter (Signed)
Notified pt that both CT & bone scan has been approved by his insurance & he may proceed with imaging as scheduled. Pt voices appreciation & understanding.

## 2016-09-19 ENCOUNTER — Ambulatory Visit
Admission: RE | Admit: 2016-09-19 | Discharge: 2016-09-19 | Disposition: A | Discharge status: Home / Self Care | Payer: PPO | Source: Ambulatory Visit | Attending: Urology | Admitting: Urology

## 2016-09-19 ENCOUNTER — Encounter
Admission: RE | Admit: 2016-09-19 | Discharge: 2016-09-19 | Disposition: A | Discharge status: Home / Self Care | Payer: PPO | Source: Ambulatory Visit | Attending: Urology | Admitting: Urology

## 2016-09-19 DIAGNOSIS — K8020 Calculus of gallbladder without cholecystitis without obstruction: Secondary | ICD-10-CM | POA: Diagnosis not present

## 2016-09-19 DIAGNOSIS — K5730 Diverticulosis of large intestine without perforation or abscess without bleeding: Secondary | ICD-10-CM | POA: Insufficient documentation

## 2016-09-19 DIAGNOSIS — R972 Elevated prostate specific antigen [PSA]: Secondary | ICD-10-CM | POA: Diagnosis not present

## 2016-09-19 DIAGNOSIS — C7951 Secondary malignant neoplasm of bone: Secondary | ICD-10-CM | POA: Insufficient documentation

## 2016-09-19 DIAGNOSIS — N400 Enlarged prostate without lower urinary tract symptoms: Secondary | ICD-10-CM | POA: Diagnosis not present

## 2016-09-19 DIAGNOSIS — I700 Atherosclerosis of aorta: Secondary | ICD-10-CM | POA: Diagnosis not present

## 2016-09-19 DIAGNOSIS — I2510 Atherosclerotic heart disease of native coronary artery without angina pectoris: Secondary | ICD-10-CM | POA: Insufficient documentation

## 2016-09-19 DIAGNOSIS — K7689 Other specified diseases of liver: Secondary | ICD-10-CM | POA: Insufficient documentation

## 2016-09-19 DIAGNOSIS — N1330 Unspecified hydronephrosis: Secondary | ICD-10-CM | POA: Insufficient documentation

## 2016-09-19 LAB — POCT I-STAT CREATININE: CREATININE: 1.1 mg/dL (ref 0.61–1.24)

## 2016-09-19 MED ORDER — TECHNETIUM TC 99M MEDRONATE IV KIT
25.0000 | PACK | Freq: Once | INTRAVENOUS | Status: AC | PRN
  Administered 2016-09-19: 22.62 via INTRAVENOUS

## 2016-09-19 MED ORDER — IOPAMIDOL (ISOVUE-300) INJECTION 61%
100.0000 mL | Freq: Once | INTRAVENOUS | Status: AC | PRN
  Administered 2016-09-19: 100 mL via INTRAVENOUS

## 2016-09-23 ENCOUNTER — Ambulatory Visit (INDEPENDENT_AMBULATORY_CARE_PROVIDER_SITE_OTHER): Payer: PPO | Admitting: Urology

## 2016-09-23 ENCOUNTER — Other Ambulatory Visit: Payer: Self-pay | Admitting: Urology

## 2016-09-23 ENCOUNTER — Encounter: Payer: Self-pay | Admitting: Urology

## 2016-09-23 VITALS — BP 120/67 | HR 57 | Ht 66.0 in | Wt 186.0 lb

## 2016-09-23 DIAGNOSIS — N4289 Other specified disorders of prostate: Secondary | ICD-10-CM | POA: Diagnosis not present

## 2016-09-23 DIAGNOSIS — R972 Elevated prostate specific antigen [PSA]: Secondary | ICD-10-CM | POA: Diagnosis not present

## 2016-09-23 DIAGNOSIS — C61 Malignant neoplasm of prostate: Secondary | ICD-10-CM

## 2016-09-23 MED ORDER — GENTAMICIN SULFATE 40 MG/ML IJ SOLN
80.0000 mg | Freq: Once | INTRAMUSCULAR | Status: AC
  Administered 2016-09-23: 80 mg via INTRAMUSCULAR

## 2016-09-23 MED ORDER — DEGARELIX ACETATE 120 MG ~~LOC~~ SOLR
240.0000 mg | Freq: Once | SUBCUTANEOUS | Status: AC
  Administered 2016-09-23: 240 mg via SUBCUTANEOUS

## 2016-09-23 MED ORDER — LEVOFLOXACIN 500 MG PO TABS
500.0000 mg | ORAL_TABLET | Freq: Once | ORAL | Status: AC
  Administered 2016-09-23: 500 mg via ORAL

## 2016-09-23 NOTE — Progress Notes (Signed)
09/23/16   Cc: prostate biopsy  HPI:  76 year old male with rapidly rising PSA up to 192, previously 66 on 05/2016. She underwent further workup including CT abdomen pelvis with contrast which showed evidence of bulky metastatic disease including periaortic and atrial caval nodes, bilateral iliac adenopathy as well as extensive patchy sclerotic osseous lesions involving the thoracal lumbar spine, all lower ribs, sacrum, bilateral iliac bones and proximal femoral.  Additionally, he is noted to have mild left hydronephrosis. Bone scan showed diffuse metastatic disease.  Patient reports that over the weekend, he developed pain overlying his sternum upon sneezing which is reproducible with palpation.  Otherwise, no new symptoms other than as per consultation last week.  Prostate Biopsy Procedure   Informed consent was obtained after discussing risks/benefits of the procedure.  A time out was performed to ensure correct patient identity.  Pre-Procedure: - Last PSA Level:  Lab Results  Component Value Date   PSA 193.89 Repeated and verified X2. (H) 09/16/2016   PSA 66.63 (H) 06/13/2016   PSA 2.21 06/07/2015   - Gentamicin given prophylactically - Levaquin 500 mg administered PO -Transrectal Ultrasound performed revealing a 32.4 gm prostate -No significant hypoechoic or median lobe noted  Procedure: - Prostate block performed using 10 cc 1% lidocaine and biopsies taken from sextant areas, a total of 12 under ultrasound guidance.  Post-Procedure: - Patient tolerated the procedure well - He was counseled to seek immediate medical attention if experiences any severe pain, significant bleeding, or fevers - Return in one week to discuss biopsy results  Assessment/plan:  1) Prostate cancer -status post confirmatory prostate biopsy today -Warning symptoms following biopsy were discussed today in detail including -Will call with biopsy results once recieved  -Imaging was reviewed with the  patient and his wife -In the setting of markedly elevated PSA and distribution of disease including osseous metastatic disease, consistent with clinical prostate cancer -Discussed immediate initiation of androgen deprivation therapy today in the form of Firmagon to reduce risk of flair, risk including hot flashes, cardiac disease, we again, loss of muscle mass, amongst others were discussed.  -Referral place to cancer Center today for evaluation for Xgeva and possible chemo/ additional adjuvant therapy given advanced rapidly progressive diease  2) left hydronephrosis  Mild left hydronephrosis, right renal function preserved with most recent creatinine 1.1 on 09/19/2016 which is stable from his baseline  Will monitor clinically, no intervention warranted at this time   Return in about 1 month (around 10/24/2016) for firmagon, PSA.   Hollice Espy, MD

## 2016-09-23 NOTE — Progress Notes (Signed)
Firmagon Sub Q Injection  Due to Prostate Cancer patient is present today for a Firmagon Injection.   Medication: Mills Koller (Degarelix)  Dose: 240mg  Location: Right, Left upper abdomen Lot: [id number] Exp: 07/2018  Patient tolerated well, no complications were noted  Performed by: Elberta Leatherwood, [place]

## 2016-09-24 ENCOUNTER — Telehealth: Payer: Self-pay

## 2016-09-24 DIAGNOSIS — C7951 Secondary malignant neoplasm of bone: Secondary | ICD-10-CM

## 2016-09-24 DIAGNOSIS — C61 Malignant neoplasm of prostate: Secondary | ICD-10-CM | POA: Insufficient documentation

## 2016-09-24 NOTE — Progress Notes (Signed)
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  INTERVAL HISTORY: Patient is a 76 year old male who was noted to have a rapidly rising PSA up to 193. Subsequent workup including a CT of the abdomen and pelvis as well as a nuclear med bone scan revealed evidence of bulky abdominal lymphadenopathy as well as widespread bony disease. Patient also underwent prostate biopsy which is currently pending. He has received one dose of Firmagon. He currently feels well and is asymptomatic. He does not complain of pain. He has no neurologic complaints. He denies any recent fevers. He has a good appetite and denies weight loss. He has no chest pain or shortness of breath.  He denies any nausea, vomiting, constipation, or diarrhea. He has no urinary complaints. Patient feels at his baseline and offers no specific complaints today.  REVIEW OF SYSTEMS:   Review of Systems  Constitutional: Negative.  Negative for fever, malaise/fatigue and weight loss.  Respiratory: Negative.  Negative for cough and shortness of breath.   Cardiovascular: Negative.  Negative for chest pain and leg swelling.  Gastrointestinal: Negative.  Negative for abdominal pain.  Genitourinary: Negative.   Musculoskeletal: Negative.  Negative for back pain.  Skin: Negative.  Negative for rash.  Neurological: Negative.  Negative for sensory change and weakness.  Psychiatric/Behavioral: Negative.  The patient is not nervous/anxious.     As per HPI. Otherwise,  a complete review of systems is negative.  PAST MEDICAL HISTORY: Past Medical History:  Diagnosis Date  . 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 CAD (coronary artery disease), native coronary artery 2014   By CT lung (04/2013)   . Cataract   . HLD (hyperlipidemia)   . HTN (hypertension)   . Hyperglycemia   . NAFLD (nonalcoholic fatty liver disease) 08/2011   by abd [place] with increased LFTs  . OSA (obstructive sleep apnea)    did not tolerate CPAP  . Other benign neoplasm of connective and other soft tissue of unspecified site    Neurofibroma of lateral periorbital area  . Peripheral neuropathy   . Personal history of colonic adenomas 08/05/2012  . Pneumonia 01/2013   CAP LUL  . Restless legs syndrome (RLS)   . Sleep apnea    no cpap    PAST SURGICAL HISTORY: Past Surgical History:  Procedure Laterality Date  . COLONOSCOPY  07/2012   5 adenomas (tubular, TV, serrated), rec rpt 5 months [name])  . COLONOSCOPY  02/2013   residual adenomas, cecal AVM, rec rpt 1 yr [name])  . COLONOSCOPY  03/2014   no residual polyp, cecal AVM, rpt 3-4 yrs [name])  . ORIF ANKLE FRACTURE  11/29/00   Dr. Tamala Julian  . SKIN CANCER EXCISION  1997   left anterior neck    FAMILY HISTORY: Family History  Problem Relation Age of Onset  . Cancer Father        liver  . Alcohol abuse Father   . Liver cancer Father   . Cancer Mother        breast  . Breast cancer Mother   .  Hypertension Brother        Half-brother  . Coronary artery disease Paternal Aunt        CABG  . Cancer Other        GM; Aunt - breast  . Colon cancer Neg Hx   . Rectal cancer Neg Hx   . Stomach cancer Neg Hx   . Esophageal cancer Neg Hx     ADVANCED DIRECTIVES (Y/N):  N  HEALTH MAINTENANCE: Social History  Substance Use Topics  . Smoking status: Former Smoker    Types: Cigarettes, Cigars    Quit date: 04/28/2002  . Smokeless tobacco: Never Used     Comment: rare cigar    . Alcohol use 2.4 oz/week    2 Standard drinks or equivalent, 2 Shots of liquor per week     Comment: Weekends     Colonoscopy:  PAP:  Bone density:  Lipid panel:  No Known Allergies  Current Outpatient Prescriptions  Medication Sig Dispense Refill  . amLODipine (NORVASC) 5 MG tablet Take 1 tablet (5 mg total) by mouth daily. 90 tablet 3  . aspirin EC 81 MG tablet Take 1 tablet (81 mg total) by mouth daily.    . cholecalciferol (VITAMIN D) 1000 UNITS tablet Take 2,000 Units by mouth daily.     . fish oil-omega-3 fatty acids 1000 MG capsule Take 2 g by mouth daily.     Marland Kitchen gabapentin (NEURONTIN) 300 MG capsule Take 1 capsule (300 mg total) by mouth at bedtime. 90 capsule 3  . lisinopril (PRINIVIL,ZESTRIL) 5 MG tablet Take 1 tablet (5 mg total) by mouth daily. 90 tablet 3  . Melatonin 10 MG CAPS Take 1 capsule by mouth as needed.    . Multiple Vitamins-Minerals (MULTIVITAMIN PO) Take 1 tablet by mouth daily.    Marland Kitchen abiraterone Acetate (ZYTIGA) 250 MG tablet Take 4 tablets (1,000 mg total) by mouth daily. Take on an empty stomach 1 hour before or 2 hours after a meal 120 tablet 2  . predniSONE (DELTASONE) 5 MG tablet Take 1 tablet (5 mg total) by mouth daily with breakfast. 30 tablet 5   No current facility-administered medications for this visit.     OBJECTIVE: Vitals:   09/26/16 1451  BP: (!) 122/56  Pulse: 63  Resp: 18  Temp: 98.3 F (36.8 C)     Body mass index is 29.92 kg/m.    ECOG FS:0 - Asymptomatic  General: Well-developed, well-nourished, no acute distress. Eyes: Pink conjunctiva, anicteric sclera. HEENT: Normocephalic, moist mucous membranes, clear oropharnyx. Lungs: Clear to auscultation bilaterally. Heart: Regular rate and rhythm. No rubs, murmurs, or gallops. Abdomen: Soft, nontender, nondistended. No organomegaly noted, normoactive bowel sounds. Musculoskeletal: No edema, cyanosis, or clubbing. Neuro: Alert, answering all questions appropriately. Cranial nerves  grossly intact. Skin: No rashes or petechiae noted. Psych: Normal affect. Lymphatics: No cervical, calvicular, axillary or inguinal LAD.   LAB RESULTS:  Lab Results  Component Value Date   NA 143 06/13/2016   K 4.2 06/13/2016   CL 105 06/13/2016   CO2 32 06/13/2016   GLUCOSE 95 06/13/2016   BUN 12 06/13/2016   CREATININE 1.10 09/19/2016   CALCIUM 9.4 06/13/2016   PROT 7.3 06/13/2016   ALBUMIN 4.3 06/13/2016   AST 26 06/13/2016   ALT 26 06/13/2016   ALKPHOS 165 (H) 06/13/2016   BILITOT 0.7 06/13/2016   GFRNONAA 43 (L) 02/16/2013   GFRAA 50 (L) 02/16/2013    Lab Results  Component Value Date  WBC 6.6 06/07/2015   NEUTROABS 3.7 06/07/2015   HGB 14.6 06/07/2015   HCT 43.4 06/07/2015   MCV 89.8 06/07/2015   PLT 194.0 06/07/2015     STUDIES: Nm Bone Scan Whole Body  Result Date: 09/19/2016 CLINICAL DATA:  Rising PSA. EXAM: NUCLEAR MEDICINE WHOLE BODY BONE SCAN TECHNIQUE: Whole body anterior and posterior images were obtained approximately 3 hours after intravenous injection of radiopharmaceutical. RADIOPHARMACEUTICALS:  22.6 mCi Technetium-56m MDP IV COMPARISON:  CT 09/19/2016. FINDINGS: Mild left hydronephrosis again noted. Multiple areas of intense increased activity noted throughout the axial and appendicular skeleton consistent with metastatic disease. Increased activity noted over both hips and femurs. These are at risk for pathologic fracture . IMPRESSION: 1. Mild left hydronephrosis again noted. 2. Diffuse metastatic disease. Increased activity noted over both hips and femurs. These at risk for pathologic fracture. Electronically Signed   By: Bland   On: 09/19/2016 17:17   Ct Abdomen Pelvis W Contrast  Result Date: 09/19/2016 CLINICAL DATA:  Marked elevated PSA. EXAM: CT ABDOMEN AND PELVIS WITH CONTRAST TECHNIQUE: Multidetector CT imaging of the abdomen and pelvis was performed using the standard protocol following bolus administration of intravenous  contrast. CONTRAST:  131mL ISOVUE-300 IOPAMIDOL (ISOVUE-300) INJECTION 61% COMPARISON:  09/16/2011 abdominal sonogram. FINDINGS: Lower chest: No significant pulmonary nodules or acute consolidative airspace disease. Right coronary atherosclerosis. Hepatobiliary: Normal liver size. Small calcifications in the anterior lateral segment left liver lobe, probably from prior granulomatous disease. No liver masses. Cholelithiasis. No biliary ductal dilatation. Pancreas: Normal, with no mass or duct dilation. Spleen: Normal size. No mass. Adrenals/Urinary Tract: Normal adrenals. Exophytic simple 2.3 cm lateral interpolar right renal cyst. Partially exophytic simple 1.3 cm lateral lower left renal cyst. No right hydronephrosis. Mild left hydroureteronephrosis the level of the lower left lumbar ureter. Delayed left contrast nephrogram. No discrete obstructing mass or stone in the left ureter. Normal bladder. Stomach/Bowel: Grossly normal stomach. Normal caliber small bowel with no small bowel wall thickening. Normal appendix. Scattered mild colonic diverticulosis, with no large bowel wall thickening or pericolonic fat stranding. Vascular/Lymphatic: Atherosclerotic nonaneurysmal abdominal aorta . Patent portal, splenic, hepatic and renal veins. Mildly enlarged 1.0 cm posterior paracaval node (series 2/ image 27). Multiple mildly enlarged aortocaval nodes, for example a 1.0 cm aortocaval node (series 2/ image 33). Enlarged left para-aortic nodes, largest 2.0 cm (series 2/ image 33). Bilateral common iliac adenopathy measuring up to 2.1 cm on the right (series 2/ image 50) and 1.4 cm on the left (series 2/ image 49). Enlarged upper right internal iliac nodes measuring up to 1.3 cm (series 2/ image 58). Reproductive: Mildly enlarged prostate. Prostate dimensions 4.1 x 4.1 x 4.3 cm (volume = 38 cm^3). Nonspecific internal prostatic calcifications. No discrete prostatic mass. Other: No pneumoperitoneum, ascites or focal fluid  collection. Musculoskeletal: Extensive patchy confluent sclerotic osseous lesions throughout the visualized thoracolumbar spine, lower ribs, sacrum, bilateral iliac bones and proximal femora. No appreciable pathologic fractures. The T12 and L1 vertebral bodies are replaced by sclerosis. Moderate thoracolumbar spondylosis. IMPRESSION: 1. Extensive patchy confluent sclerotic osseous metastatic disease throughout the visualized axial and proximal appendicular skeleton, compatible with blastic metastases of prostatic origin. 2. Retroperitoneal and bilateral upper pelvic lymphadenopathy compatible with nodal metastatic disease. 3. Mild left hydroureteronephrosis to the level of the lower left lumbar ureter with asymmetrically delayed left contrast nephrogram (indicating left urinary tract obstruction). No discrete obstructing stone or mass in the left ureter. Consider further evaluation with retrograde pyelograms and/or hematuria protocol CT abdomen/pelvis  without and with IV contrast. 4. Mildly enlarged prostate.  No discrete prostatic mass. 5. Additional findings include aortic atherosclerosis, coronary atherosclerosis and cholelithiasis. These results will be called to the ordering clinician or representative by the Radiologist Assistant, and communication documented in the PACS or zVision Dashboard. Electronically Signed   By: Ilona Sorrel M.D.   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   PLAN:    1. 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 Prostate biopsy results are pending at time of dictation. Patient has already initiated [place]. He will return to clinic in 1 week to initiate Xgeva for his bony metastasis. Given the aggressiveness of his prostate cancer, we also discussed additional systemic  therapy including 6 cycles of docetaxel and Zytiga 1000mg  plus prednisone 5mg  daily. We also discussed the option of using a GnRH antagonist plus Xgeva alone. Patient has decided on oral treatment using Zytiga and prednisone. He will return to clinic in 1 week for Xgeva and then in 5 weeks for laboratory work, IT consultant, and to assess his toleration of Zytiga.  Approximately 60 minutes was spent in discussion of which greater than 50% was consultation.   Patient expressed understanding and was in agreement with this plan. He also understands that He can call clinic at any time with any questions, concerns, or complaints.   Cancer Staging Prostate cancer [facility]) Staging form: Prostate, AJCC 8th Edition - Clinical stage from 09/26/2016: Stage IVB (cTX, cN1, cM1c, PSA: 193) - Signed by Lloyd Huger, MD on 09/26/2016   Lloyd Huger, MD   09/26/2016 5:19 PM

## 2016-09-24 NOTE — Telephone Encounter (Signed)
  Oncology Nurse Navigator Documentation Called and spoke with Justin Ali. Introduced Therapist, nutritional and provided contact information for any futture questions/concerns/needs. Notified of appointment with Dr. Grayland Ormond in [place] 09/26/16 at 1500. Justin Ali is aware of location. Read back performed.  Navigator Location: [facility] (09/24/16 0900) Referral date to RadOnc/MedOnc: 09/23/16 (09/24/16 0900) )Navigator Encounter Type: Introductory phone call;Telephone (09/24/16 0900) Telephone: Lahoma Crocker Call;Appt Confirmation/Clarification (09/24/16 0900) Abnormal Finding Date: 09/16/16 (09/24/16 0900)                     Barriers/Navigation Needs: Coordination of Care (09/24/16 0900)   Interventions: Coordination of Care (09/24/16 0900)   Coordination of Care: Appts (09/24/16 0900)        Acuity: Level 2 (09/24/16 0900)   Acuity Level 2: Initial guidance, education and coordination as needed;Educational needs;Assistance expediting appointments;Ongoing guidance and education throughout treatment as needed (09/24/16 0900)     Time Spent with Patient: 30 (09/24/16 0900)

## 2016-09-26 ENCOUNTER — Inpatient Hospital Stay: Payer: PPO | Attending: Oncology | Admitting: Oncology

## 2016-09-26 ENCOUNTER — Encounter: Payer: Self-pay | Admitting: Oncology

## 2016-09-26 DIAGNOSIS — Z808 Family history of malignant neoplasm of other organs or systems: Secondary | ICD-10-CM | POA: Insufficient documentation

## 2016-09-26 DIAGNOSIS — Z7952 Long term (current) use of systemic steroids: Secondary | ICD-10-CM | POA: Diagnosis not present

## 2016-09-26 DIAGNOSIS — Z803 Family history of malignant neoplasm of breast: Secondary | ICD-10-CM | POA: Insufficient documentation

## 2016-09-26 DIAGNOSIS — Z7982 Long term (current) use of aspirin: Secondary | ICD-10-CM | POA: Insufficient documentation

## 2016-09-26 DIAGNOSIS — C61 Malignant neoplasm of prostate: Secondary | ICD-10-CM | POA: Diagnosis not present

## 2016-09-26 DIAGNOSIS — Z87891 Personal history of nicotine dependence: Secondary | ICD-10-CM | POA: Diagnosis not present

## 2016-09-26 DIAGNOSIS — R590 Localized enlarged lymph nodes: Secondary | ICD-10-CM | POA: Insufficient documentation

## 2016-09-26 DIAGNOSIS — C7951 Secondary malignant neoplasm of bone: Secondary | ICD-10-CM | POA: Diagnosis not present

## 2016-09-26 DIAGNOSIS — E785 Hyperlipidemia, unspecified: Secondary | ICD-10-CM | POA: Diagnosis not present

## 2016-09-26 DIAGNOSIS — Z79899 Other long term (current) drug therapy: Secondary | ICD-10-CM | POA: Diagnosis not present

## 2016-09-26 DIAGNOSIS — I2510 Atherosclerotic heart disease of native coronary artery without angina pectoris: Secondary | ICD-10-CM | POA: Diagnosis not present

## 2016-09-26 DIAGNOSIS — I10 Essential (primary) hypertension: Secondary | ICD-10-CM | POA: Diagnosis not present

## 2016-09-26 DIAGNOSIS — G4733 Obstructive sleep apnea (adult) (pediatric): Secondary | ICD-10-CM | POA: Diagnosis not present

## 2016-09-26 MED ORDER — ABIRATERONE ACETATE 250 MG PO TABS: 1000.0000 mg | ORAL_TABLET | Freq: Every day | ORAL | 2 refills | Status: DC

## 2016-09-26 MED ORDER — PREDNISONE 5 MG PO TABS: 5.0000 mg | ORAL_TABLET | Freq: Every day | ORAL | 5 refills | Status: DC

## 2016-09-27 LAB — PATHOLOGY REPORT

## 2016-09-29 ENCOUNTER — Other Ambulatory Visit: Payer: Self-pay | Admitting: Urology

## 2016-10-03 ENCOUNTER — Inpatient Hospital Stay: Payer: PPO

## 2016-10-03 VITALS — BP 150/65 | HR 53 | Temp 95.5°F | Resp 18

## 2016-10-03 DIAGNOSIS — C61 Malignant neoplasm of prostate: Secondary | ICD-10-CM | POA: Diagnosis not present

## 2016-10-03 LAB — CBC WITH DIFFERENTIAL/PLATELET
BASOS ABS: 0 10*3/uL (ref 0–0.1)
Basophils Relative: 1 %
EOS PCT: 4 %
Eosinophils Absolute: 0.2 10*3/uL (ref 0–0.7)
HCT: 40.3 % (ref 40.0–52.0)
Hemoglobin: 13.6 g/dL (ref 13.0–18.0)
LYMPHS PCT: 38 %
Lymphs Abs: 1.7 10*3/uL (ref 1.0–3.6)
MCH: 30.1 pg (ref 26.0–34.0)
MCHC: 33.6 g/dL (ref 32.0–36.0)
MCV: 89.4 fL (ref 80.0–100.0)
Monocytes Absolute: 0.3 10*3/uL (ref 0.2–1.0)
Monocytes Relative: 6 %
NEUTROS ABS: 2.3 10*3/uL (ref 1.4–6.5)
NEUTROS PCT: 51 %
PLATELETS: 178 10*3/uL (ref 150–440)
RBC: 4.52 MIL/uL (ref 4.40–5.90)
RDW: 13.6 % (ref 11.5–14.5)
WBC: 4.5 10*3/uL (ref 3.8–10.6)

## 2016-10-03 LAB — COMPREHENSIVE METABOLIC PANEL
ALT: 29 U/L (ref 17–63)
AST: 33 U/L (ref 15–41)
Albumin: 4 g/dL (ref 3.5–5.0)
Alkaline Phosphatase: 529 U/L — ABNORMAL HIGH (ref 38–126)
Anion gap: 6 (ref 5–15)
BUN: 17 mg/dL (ref 6–20)
CHLORIDE: 106 mmol/L (ref 101–111)
CO2: 28 mmol/L (ref 22–32)
CREATININE: 1.22 mg/dL (ref 0.61–1.24)
Calcium: 8.8 mg/dL — ABNORMAL LOW (ref 8.9–10.3)
GFR calc Af Amer: >60 mL/min (ref >60)
GFR, EST NON AFRICAN AMERICAN: 57 mL/min — AB (ref >60)
Glucose, Bld: 127 mg/dL — ABNORMAL HIGH (ref 65–99)
Potassium: 4.6 mmol/L (ref 3.5–5.1)
Sodium: 140 mmol/L (ref 135–145)
Total Bilirubin: 0.7 mg/dL (ref 0.3–1.2)
Total Protein: 7.3 g/dL (ref 6.5–8.1)

## 2016-10-03 MED ORDER — DENOSUMAB 120 MG/1.7ML ~~LOC~~ SOLN
120.0000 mg | Freq: Once | SUBCUTANEOUS | Status: AC
  Administered 2016-10-03: 120 mg via SUBCUTANEOUS

## 2016-10-03 NOTE — Patient Instructions (Signed)

## 2016-10-07 ENCOUNTER — Encounter: Payer: Self-pay | Admitting: Urology

## 2016-10-07 ENCOUNTER — Ambulatory Visit (INDEPENDENT_AMBULATORY_CARE_PROVIDER_SITE_OTHER): Payer: PPO | Admitting: Urology

## 2016-10-07 VITALS — BP 133/67 | HR 58 | Ht 66.0 in | Wt 190.0 lb

## 2016-10-07 DIAGNOSIS — F419 Anxiety disorder, unspecified: Secondary | ICD-10-CM | POA: Diagnosis not present

## 2016-10-07 DIAGNOSIS — C61 Malignant neoplasm of prostate: Secondary | ICD-10-CM

## 2016-10-07 DIAGNOSIS — N1330 Unspecified hydronephrosis: Secondary | ICD-10-CM

## 2016-10-09 NOTE — Progress Notes (Signed)
10/07/2016 3:35 PM   Justin Ali November 07, 1940 [id number]  Referring provider: Ria Bush, MD [place], [place] [address]  No chief complaint on file.   HPI: 76 year old male with metastatic high risk prostate cancer returns today to review his biopsy results.  He was  noted to have an incidentally elevated PSA to 66.63 in 05/2016 up significantly from his baseline of approximately 2 over the past several years.  PSA was repeated on 09/16/2016 which had risen to 193.  Rectal exam with diffuse firmness, but no obvious nodules.    He underwent prostate biopsy in 09/23/2016, TRUS volume 32 g.  Pathology consistent with Gleason 4+4 involving 3 of 12 cores on the right.    Based on the patient's clinical presentation and staging, he was given for Firmagon at the time of biopsy.  CT abdomen pelvis with contrast which showed evidence of bulky metastatic disease including periaortic and atrial caval nodes, bilateral iliac adenopathy as well as extensive patchy sclerotic osseous lesions involving the thoracal lumbar spine, all lower ribs, sacrum, bilateral iliac bones and proximal femoral.  Additionally, he is noted to have mild left hydronephrosis. Bone scan showed diffuse metastatic disease.  He started been seen and evaluated by medical oncology, Dr. Grayland Ormond. He's been started on Xgeva infusions  and was offered docetaxel versus Zytiga.  He will be starting zytiga + prednisone.  He denies a family history of prostate cancer. Prior to this year, his PSA has never been elevated in the past. He has never seen a urologist.  He denies any urinary symptoms other than a slight decrease in his urinary stream. No urinary urgency, frequency, dysuria, gross hematuria, or significant nocturia. He is satisfied with his voiding symptoms.  Overall, he is feeling anxious about the situation. He is having trouble sleeping at night.  PMH: Past Medical History:  Diagnosis Date    . 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 CAD (coronary artery disease), native coronary artery 2014   By CT lung (04/2013)   . Cataract   . HLD (hyperlipidemia)   . HTN (hypertension)   . Hyperglycemia   . NAFLD (nonalcoholic fatty liver disease) 08/2011   by abd [place] with increased LFTs  . OSA (obstructive sleep apnea)    did not tolerate CPAP  . Other benign neoplasm of connective and other soft tissue of unspecified site    Neurofibroma of lateral periorbital area  . Peripheral neuropathy   . Personal history of colonic adenomas 08/05/2012  . Pneumonia 01/2013   CAP LUL  . Restless legs syndrome (RLS)   . Sleep apnea    no cpap    Surgical History: Past Surgical History:  Procedure Laterality Date  . COLONOSCOPY  07/2012   5 adenomas (tubular, TV, serrated), rec rpt 5 months [name])  . COLONOSCOPY  02/2013   residual adenomas, cecal AVM, rec rpt 1 yr [name])  . COLONOSCOPY  03/2014   no residual polyp, cecal AVM, rpt 3-4 yrs [name])  . ORIF ANKLE FRACTURE  11/29/00   Dr. Tamala Julian  . SKIN CANCER EXCISION  1997   left anterior neck    Home Medications:  Allergies as of 10/07/2016   No Known Allergies     Medication List       Accurate as of 10/07/16 11:59 PM. Always use your most recent med list.          abiraterone Acetate 250  MG tablet Commonly known as:  ZYTIGA Take 4 tablets (1,000 mg total) by mouth daily. Take on an empty stomach 1 hour before or 2 hours after a meal   amLODipine 5 MG tablet Commonly known as:  NORVASC Take 1 tablet (5 mg total) by mouth daily.   aspirin EC 81 MG tablet Take 1 tablet (81 mg total) by mouth daily.   cholecalciferol 1000 units tablet Commonly known as:  VITAMIN D Take 2,000 Units by mouth daily.   fish oil-omega-3 fatty acids 1000 MG capsule Take 2 g by mouth daily.   gabapentin 300 MG capsule Commonly known as:  NEURONTIN Take 1 capsule (300 mg total) by mouth at  bedtime.   lisinopril 5 MG tablet Commonly known as:  PRINIVIL,ZESTRIL Take 1 tablet (5 mg total) by mouth daily.   Melatonin 10 MG Caps Take 1 capsule by mouth as needed.   MULTIVITAMIN PO Take 1 tablet by mouth daily.   predniSONE 5 MG tablet Commonly known as:  DELTASONE Take 1 tablet (5 mg total) by mouth daily with breakfast.       Allergies: No Known Allergies  Family History: Family History  Problem Relation Age of Onset  . Cancer Father        liver  . Alcohol abuse Father   . Liver cancer Father   . Cancer Mother        breast  . Breast cancer Mother   . Hypertension Brother        Half-brother  . Coronary artery disease Paternal Aunt        CABG  . Cancer Other        GM; Aunt - breast  . Colon cancer Neg Hx   . Rectal cancer Neg Hx   . Stomach cancer Neg Hx   . Esophageal cancer Neg Hx     Social History:  reports that he quit smoking about 14 years ago. His smoking use included Cigarettes and Cigars. He has never used smokeless tobacco. He reports that he drinks about 2.4 oz of alcohol per week . He reports that he does not use drugs.  ROS: UROLOGY Frequent Urination?: No Hard to postpone urination?: No Burning/pain with urination?: No Get up at night to urinate?: No Leakage of urine?: No Urine stream starts and stops?: No Trouble starting stream?: No Do you have to strain to urinate?: No Blood in urine?: No Urinary tract infection?: No Sexually transmitted disease?: No Injury to kidneys or bladder?: No Painful intercourse?: No Weak stream?: No Erection problems?: Yes Penile pain?: No  Gastrointestinal Nausea?: No Vomiting?: No Indigestion/heartburn?: No Diarrhea?: No Constipation?: Yes  Constitutional Fever: No Night sweats?: No Weight loss?: No Fatigue?: No  Skin Skin rash/lesions?: No Itching?: No  Eyes Blurred vision?: No Double vision?: No  Ears/Nose/Throat Sore throat?: No Sinus problems?:  No  Hematologic/Lymphatic Swollen glands?: No Easy bruising?: No  Cardiovascular Leg swelling?: No Chest pain?: No  Respiratory Cough?: No Shortness of breath?: No  Endocrine Excessive thirst?: No  Musculoskeletal Back pain?: No Joint pain?: No  Neurological Headaches?: No Dizziness?: No  Psychologic Depression?: No Anxiety?: No  Physical Exam: BP 133/67   Pulse (!) 58   Ht 5\' 6"  (1.676 m)   Wt 190 lb (86.2 kg)   BMI 30.67 kg/m   Constitutional:  Alert and oriented, No acute distress.  Accompanied by wife today. HEENT: [place] AT, moist mucus membranes.  Trachea midline, no masses.  Orbital irregularity appreciated on left  Cardiovascular: No clubbing, cyanosis, or edema. Respiratory: Normal respiratory effort, no increased work of breathing. GI: Abdomen is soft, nontender, nondistended, no abdominal masses GU: No CVA tenderness.  Skin: No rashes, bruises or suspicious lesions. Neurologic: Grossly intact, no focal deficits, moving all 4 extremities. Psychiatric: Normal mood and affect.  Mildly anxious at times.  Laboratory Data: Lab Results  Component Value Date   WBC 4.5 10/03/2016   HGB 13.6 10/03/2016   HCT 40.3 10/03/2016   MCV 89.4 10/03/2016   PLT 178 10/03/2016    Lab Results  Component Value Date   CREATININE 1.22 10/03/2016    Component     Latest Ref Rng & Units 05/24/2012 05/26/2013 05/29/2014 06/07/2015  PSA     0.10 - 4.00 ng/mL 2.89 2.50 1.34 2.21   Component     Latest Ref Rng & Units 06/13/2016 09/16/2016  PSA     0.10 - 4.00 ng/mL 66.63 (H) 193.89 Repeated and verified X2. (H)     Urinalysis Results for orders placed or performed in visit on 10/03/16  CBC with Differential/Platelet  Result Value Ref Range   WBC 4.5 3.8 - 10.6 K/uL   RBC 4.52 4.40 - 5.90 MIL/uL   Hemoglobin 13.6 13.0 - 18.0 g/dL   HCT 40.3 40.0 - 52.0 %   MCV 89.4 80.0 - 100.0 fL   MCH 30.1 26.0 - 34.0 pg   MCHC 33.6 32.0 - 36.0 g/dL   RDW 13.6 11.5 - 14.5 %    Platelets 178 150 - 440 K/uL   Neutrophils Relative % 51 %   Neutro Abs 2.3 1.4 - 6.5 K/uL   Lymphocytes Relative 38 %   Lymphs Abs 1.7 1.0 - 3.6 K/uL   Monocytes Relative 6 %   Monocytes Absolute 0.3 0.2 - 1.0 K/uL   Eosinophils Relative 4 %   Eosinophils Absolute 0.2 0 - 0.7 K/uL   Basophils Relative 1 %   Basophils Absolute 0.0 0 - 0.1 K/uL  Comprehensive metabolic panel  Result Value Ref Range   Sodium 140 135 - 145 mmol/L   Potassium 4.6 3.5 - 5.1 mmol/L   Chloride 106 101 - 111 mmol/L   CO2 28 22 - 32 mmol/L   Glucose, Bld 127 (H) 65 - 99 mg/dL   BUN 17 6 - 20 mg/dL   Creatinine, Ser 1.22 0.61 - 1.24 mg/dL   Calcium 8.8 (L) 8.9 - 10.3 mg/dL   Total Protein 7.3 6.5 - 8.1 g/dL   Albumin 4.0 3.5 - 5.0 g/dL   AST 33 15 - 41 U/L   ALT 29 17 - 63 U/L   Alkaline Phosphatase 529 (H) 38 - 126 U/L   Total Bilirubin 0.7 0.3 - 1.2 mg/dL   GFR calc non Af Amer 57 (L) >60 mL/min   GFR calc Af Amer >60 >60 mL/min   Anion gap 6 5 - 15    Pertinent Imaging: N/a  Assessment & Plan:    1. Prostate cancer High-risk Gleason 4+4 prostate cancer metastatic to bone scan, significant adenopathy [facility] now by [facility] Doctor Lorrin Goodell on [address] in our office, Xgeva/ Zytiga + pred Will be due for Lupron at the end of the month, we'll plan for 6 month injections and then defer to [place] in the future He should advised to follow up with urology if he develops voiding symptoms  2. Left hydronephrosis Renal function preserved, should improve with treatment of his prostate cancer I will advise Dr.  Finnegan to send patient back if this does not resolve or worsens on follow-up imaging or if renal function begins to decline  3. Anxiety/ insomnia Strongly recommend following up with PCP to discuss this further, common with adjustment to denude diagnoses and may require pharmacotherapy intervention  Hollice Espy, MD  [address] [place], [place] [address] [phone]

## 2016-10-11 ENCOUNTER — Telehealth: Payer: Self-pay | Admitting: Family Medicine

## 2016-10-11 NOTE — Telephone Encounter (Signed)
New diagnosis of metastatic prostate cancer. I spoke with patient a few weeks ago.  Will touch base with patient on Monday about trouble sleeping /anxiety after recent dx.

## 2016-10-13 MED ORDER — TRAZODONE HCL 50 MG PO TABS: 25.0000 mg | ORAL_TABLET | Freq: Every evening | ORAL | 3 refills | Status: DC | PRN

## 2016-10-13 NOTE — Telephone Encounter (Signed)
Spoke with patient. Ongoing sleep maintenance > initiation insomnia. Already on melatonin 20mg  nightly and aleve PM not effective. Will trial trazodone 25-50mg  nightly. Pt will update me with effect.

## 2016-10-16 ENCOUNTER — Other Ambulatory Visit: Payer: Self-pay | Admitting: *Deleted

## 2016-10-16 ENCOUNTER — Telehealth: Payer: Self-pay | Admitting: *Deleted

## 2016-10-16 MED ORDER — TEMAZEPAM 15 MG PO CAPS: 15.0000 mg | ORAL_CAPSULE | Freq: Every evening | ORAL | 0 refills | Status: DC | PRN

## 2016-10-16 NOTE — Telephone Encounter (Signed)
Trazodone ineffective. plz call pt - we can try restoril for sleep - try to use sparingly as it can be habit forming.  Come in for f/u if no better. plz phone in.

## 2016-10-16 NOTE — Telephone Encounter (Signed)
Pt left vm at triage stating he was recently seen and given Rx for Trazodone. He states it is ineffective and is requesting a new Rx for a stronger dose or different medication. pls advise

## 2016-10-16 NOTE — Telephone Encounter (Signed)
Spoke with patient informed of the following: we can try restoril for sleep - try to use sparingly as it can be habit forming.  Come in for f/u if no better.  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

## 2016-10-22 ENCOUNTER — Other Ambulatory Visit: Payer: PPO

## 2016-10-24 ENCOUNTER — Ambulatory Visit (INDEPENDENT_AMBULATORY_CARE_PROVIDER_SITE_OTHER): Payer: PPO

## 2016-10-24 ENCOUNTER — Ambulatory Visit: Payer: PPO | Admitting: Urology

## 2016-10-24 VITALS — BP 116/67 | HR 55 | Ht 66.0 in | Wt 196.7 lb

## 2016-10-24 DIAGNOSIS — C61 Malignant neoplasm of prostate: Secondary | ICD-10-CM

## 2016-10-24 MED ORDER — LEUPROLIDE ACETATE (6 MONTH) 45 MG IM KIT
45.0000 mg | PACK | Freq: Once | INTRAMUSCULAR | Status: AC
  Administered 2016-10-24: 45 mg via INTRAMUSCULAR

## 2016-10-24 NOTE — Progress Notes (Signed)
Lupron IM Injection   Due to Prostate Cancer patient is present today for a Lupron Injection.  Medication: Lupron 45mg  for 6- month Dose: 45 mg  Location: left upper outer buttocks Lot: [id number]  Exp: 12/29/2017  Patient tolerated well, no complications were noted  Performed by: C. Corinna Capra, CMA   Follow up: with the cancer center

## 2016-10-28 NOTE — Progress Notes (Signed)
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  INTERVAL HISTORY: Patient returns to clinic today for continuation of Xgeva and further evaluation. He is tolerating Zytiga well without significant side effects. He has an increased appetite and mild weight gain secondary to the prednisone. He currently feels well and is asymptomatic. He does not complain of pain. He has no neurologic complaints. He denies any recent fevers. He has no chest pain or shortness of breath.  He denies any nausea, vomiting, constipation, or diarrhea. He has no urinary complaints. Patient feels at his baseline and offers no specific complaints today.  REVIEW OF SYSTEMS:   Review of Systems  Constitutional: Negative.  Negative for fever, malaise/fatigue and weight loss.  Respiratory: Negative.  Negative for cough and shortness of breath.   Cardiovascular: Negative.  Negative for chest pain and leg swelling.  Gastrointestinal: Negative.  Negative for abdominal pain.  Genitourinary: Negative.   Musculoskeletal: Negative.  Negative for back pain.  Skin: Negative.  Negative for rash.  Neurological: Negative.  Negative for sensory change and weakness.  Psychiatric/Behavioral: Negative.  The patient is not nervous/anxious.     As per HPI. Otherwise, a complete review of systems is negative.  PAST MEDICAL HISTORY: Past Medical History:  Diagnosis Date  . 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 CAD (coronary artery disease), native coronary artery 2014   By CT lung (04/2013)   . Cataract   . HLD (hyperlipidemia)   . HTN (hypertension)   . Hyperglycemia   . NAFLD (nonalcoholic fatty liver disease) 08/2011   by abd [place] with increased LFTs  . OSA (obstructive sleep apnea)    did not tolerate CPAP  . Other benign neoplasm of connective and other soft tissue of unspecified site    Neurofibroma of lateral periorbital area  . Peripheral neuropathy   . Personal history of colonic adenomas 08/05/2012  . Pneumonia 01/2013   CAP LUL  . Restless legs syndrome (RLS)   . Sleep apnea    no cpap    PAST SURGICAL HISTORY: Past Surgical History:  Procedure Laterality Date  . COLONOSCOPY  07/2012   5 adenomas (tubular, TV, serrated), rec rpt 5 months [name])  . COLONOSCOPY  02/2013   residual adenomas, cecal AVM, rec rpt 1 yr [name])  . COLONOSCOPY  03/2014   no residual polyp, cecal AVM, rpt 3-4 yrs [name])  . ORIF ANKLE FRACTURE  11/29/00   Dr. Tamala Julian  . SKIN CANCER EXCISION  1997   left anterior neck    FAMILY HISTORY: Family History  Problem Relation Age of Onset  . Cancer Father        liver  . Alcohol abuse Father   . Liver cancer Father   . Cancer Mother        breast  . Breast cancer Mother   . Hypertension Brother        Half-brother  . Coronary artery disease Paternal Aunt        CABG  . Cancer Other  GM; Aunt - breast  . Colon cancer Neg Hx   . Rectal cancer Neg Hx   . Stomach cancer Neg Hx   . Esophageal cancer Neg Hx     ADVANCED DIRECTIVES (Y/N):  N  HEALTH MAINTENANCE: Social History  Substance Use Topics  . Smoking status: Former Smoker    Types: Cigarettes, Cigars    Quit date: 04/28/2002  . Smokeless tobacco: Never Used     Comment: rare cigar  . Alcohol use 2.4 oz/week    2 Standard drinks or equivalent, 2 Shots of liquor per week     Comment: Weekends     Colonoscopy:  PAP:  Bone density:  Lipid panel:  No Known  Allergies  Current Outpatient Prescriptions  Medication Sig Dispense Refill  . abiraterone Acetate (ZYTIGA) 250 MG tablet Take 4 tablets (1,000 mg total) by mouth daily. Take on an empty stomach 1 hour before or 2 hours after a meal 120 tablet 2  . amLODipine (NORVASC) 5 MG tablet Take 1 tablet (5 mg total) by mouth daily. 90 tablet 3  . aspirin EC 81 MG tablet Take 1 tablet (81 mg total) by mouth daily.    . cholecalciferol (VITAMIN D) 1000 UNITS tablet Take 2,000 Units by mouth daily.     . fish oil-omega-3 fatty acids 1000 MG capsule Take 2 g by mouth daily.     Marland Kitchen gabapentin (NEURONTIN) 300 MG capsule Take 1 capsule (300 mg total) by mouth at bedtime. 90 capsule 3  . lisinopril (PRINIVIL,ZESTRIL) 5 MG tablet Take 1 tablet (5 mg total) by mouth daily. 90 tablet 3  . Melatonin 10 MG CAPS Take 1 capsule by mouth as needed.    . Multiple Vitamins-Minerals (MULTIVITAMIN PO) Take 1 tablet by mouth daily.    . predniSONE (DELTASONE) 5 MG tablet Take 1 tablet (5 mg total) by mouth daily with breakfast. 30 tablet 5  . temazepam (RESTORIL) 15 MG capsule Take 1 capsule (15 mg total) by mouth at bedtime as needed for sleep. 30 capsule 0   No current facility-administered medications for this visit.     OBJECTIVE: Vitals:   10/31/16 0937  BP: (!) 146/77  Pulse: 81  Resp: 20  Temp: (!) 96.8 F (36 C)     Body mass index is 31.81 kg/m.    ECOG FS:0 - Asymptomatic  General: Well-developed, well-nourished, no acute distress. Eyes: Pink conjunctiva, anicteric sclera. Lungs: Clear to auscultation bilaterally. Heart: Regular rate and rhythm. No rubs, murmurs, or gallops. Abdomen: Soft, nontender, nondistended. No organomegaly noted, normoactive bowel sounds. Musculoskeletal: No edema, cyanosis, or clubbing. Neuro: Alert, answering all questions appropriately. Cranial nerves grossly intact. Skin: No rashes or petechiae noted. Psych: Normal affect.   LAB RESULTS:  Lab Results  Component  Value Date   NA 140 10/31/2016   K 4.5 10/31/2016   CL 107 10/31/2016   CO2 26 10/31/2016   GLUCOSE 108 (H) 10/31/2016   BUN 17 10/31/2016   CREATININE 1.01 10/31/2016   CALCIUM 7.6 (L) 10/31/2016   PROT 7.4 10/31/2016   ALBUMIN 4.2 10/31/2016   AST 24 10/31/2016   ALT 23 10/31/2016   ALKPHOS 348 (H) 10/31/2016   BILITOT 0.6 10/31/2016   GFRNONAA >60 10/31/2016   GFRAA >60 10/31/2016    Lab Results  Component Value Date   WBC 5.0 10/31/2016   NEUTROABS 2.7 10/31/2016   HGB 13.4 10/31/2016   HCT 39.2 (L) 10/31/2016   MCV 89.7 10/31/2016   PLT  178 10/31/2016     STUDIES: No results found.  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   PLAN:    1. 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 Prostate biopsy results revealed Gleason's 8 (4+4) adenocarcinoma. Continue Zytiga and prednisone as prescribed. Patient will not receive Delton See today given his hypocalcemia. Return to clinic in 1 month for repeat laboratory work, consideration of Xgeva, and further evaluation.  2. Hypocalcemia: Continue calcium and vitamin D supplementation. No Xgeva as above. Monitor.  Approximately 30 minutes was spent in discussion of which greater than 50% was consultation.   Patient expressed understanding and was in agreement with this plan. He also understands that He can call clinic at any time with any questions, concerns, or complaints.   Cancer Staging Prostate cancer [facility]) Staging form: Prostate, AJCC 8th Edition - Clinical stage from 09/26/2016: Stage IVB (cTX, cN1, cM1c, PSA: 193) - Signed by Lloyd Huger, MD on 09/26/2016   Lloyd Huger, MD   10/31/2016 2:40 PM

## 2016-10-31 ENCOUNTER — Inpatient Hospital Stay: Payer: PPO | Attending: Oncology | Admitting: Oncology

## 2016-10-31 ENCOUNTER — Inpatient Hospital Stay: Payer: PPO

## 2016-10-31 VITALS — BP 146/77 | HR 81 | Temp 96.8°F | Resp 20 | Wt 197.1 lb

## 2016-10-31 DIAGNOSIS — G4733 Obstructive sleep apnea (adult) (pediatric): Secondary | ICD-10-CM | POA: Diagnosis not present

## 2016-10-31 DIAGNOSIS — Z808 Family history of malignant neoplasm of other organs or systems: Secondary | ICD-10-CM | POA: Diagnosis not present

## 2016-10-31 DIAGNOSIS — C61 Malignant neoplasm of prostate: Secondary | ICD-10-CM | POA: Diagnosis not present

## 2016-10-31 DIAGNOSIS — Z85828 Personal history of other malignant neoplasm of skin: Secondary | ICD-10-CM | POA: Insufficient documentation

## 2016-10-31 DIAGNOSIS — C7951 Secondary malignant neoplasm of bone: Secondary | ICD-10-CM | POA: Diagnosis not present

## 2016-10-31 DIAGNOSIS — K760 Fatty (change of) liver, not elsewhere classified: Secondary | ICD-10-CM | POA: Diagnosis not present

## 2016-10-31 DIAGNOSIS — E785 Hyperlipidemia, unspecified: Secondary | ICD-10-CM | POA: Diagnosis not present

## 2016-10-31 DIAGNOSIS — R590 Localized enlarged lymph nodes: Secondary | ICD-10-CM | POA: Diagnosis not present

## 2016-10-31 DIAGNOSIS — I2510 Atherosclerotic heart disease of native coronary artery without angina pectoris: Secondary | ICD-10-CM | POA: Insufficient documentation

## 2016-10-31 DIAGNOSIS — Z79899 Other long term (current) drug therapy: Secondary | ICD-10-CM | POA: Insufficient documentation

## 2016-10-31 DIAGNOSIS — I10 Essential (primary) hypertension: Secondary | ICD-10-CM | POA: Insufficient documentation

## 2016-10-31 DIAGNOSIS — Z803 Family history of malignant neoplasm of breast: Secondary | ICD-10-CM | POA: Insufficient documentation

## 2016-10-31 DIAGNOSIS — Z87891 Personal history of nicotine dependence: Secondary | ICD-10-CM | POA: Diagnosis not present

## 2016-10-31 DIAGNOSIS — Z7982 Long term (current) use of aspirin: Secondary | ICD-10-CM | POA: Insufficient documentation

## 2016-10-31 LAB — CBC WITH DIFFERENTIAL/PLATELET
BASOS ABS: 0 10*3/uL (ref 0–0.1)
BASOS PCT: 1 %
EOS PCT: 3 %
Eosinophils Absolute: 0.1 10*3/uL (ref 0–0.7)
HEMATOCRIT: 39.2 % — AB (ref 40.0–52.0)
Hemoglobin: 13.4 g/dL (ref 13.0–18.0)
Lymphocytes Relative: 34 %
Lymphs Abs: 1.7 10*3/uL (ref 1.0–3.6)
MCH: 30.7 pg (ref 26.0–34.0)
MCHC: 34.2 g/dL (ref 32.0–36.0)
MCV: 89.7 fL (ref 80.0–100.0)
MONO ABS: 0.5 10*3/uL (ref 0.2–1.0)
Monocytes Relative: 9 %
NEUTROS ABS: 2.7 10*3/uL (ref 1.4–6.5)
Neutrophils Relative %: 53 %
Platelets: 178 10*3/uL (ref 150–440)
RBC: 4.37 MIL/uL — ABNORMAL LOW (ref 4.40–5.90)
RDW: 14.3 % (ref 11.5–14.5)
WBC: 5 10*3/uL (ref 3.8–10.6)

## 2016-10-31 LAB — COMPREHENSIVE METABOLIC PANEL
ALBUMIN: 4.2 g/dL (ref 3.5–5.0)
ALT: 23 U/L (ref 17–63)
AST: 24 U/L (ref 15–41)
Alkaline Phosphatase: 348 U/L — ABNORMAL HIGH (ref 38–126)
Anion gap: 7 (ref 5–15)
BILIRUBIN TOTAL: 0.6 mg/dL (ref 0.3–1.2)
BUN: 17 mg/dL (ref 6–20)
CHLORIDE: 107 mmol/L (ref 101–111)
CO2: 26 mmol/L (ref 22–32)
Calcium: 7.6 mg/dL — ABNORMAL LOW (ref 8.9–10.3)
Creatinine, Ser: 1.01 mg/dL (ref 0.61–1.24)
GFR calc Af Amer: >60 mL/min (ref >60)
Glucose, Bld: 108 mg/dL — ABNORMAL HIGH (ref 65–99)
POTASSIUM: 4.5 mmol/L (ref 3.5–5.1)
Sodium: 140 mmol/L (ref 135–145)
TOTAL PROTEIN: 7.4 g/dL (ref 6.5–8.1)

## 2016-10-31 LAB — PSA: PROSTATIC SPECIFIC ANTIGEN: 2.1 ng/mL (ref 0.00–4.00)

## 2016-10-31 NOTE — Progress Notes (Signed)
Patient denies any concerns today.  

## 2016-11-04 ENCOUNTER — Encounter: Payer: Self-pay | Admitting: Family Medicine

## 2016-11-04 ENCOUNTER — Encounter: Payer: Self-pay | Admitting: Oncology

## 2016-11-06 NOTE — Telephone Encounter (Signed)
Spoke w/pt. °

## 2016-11-28 ENCOUNTER — Inpatient Hospital Stay (HOSPITAL_BASED_OUTPATIENT_CLINIC_OR_DEPARTMENT_OTHER): Payer: PPO | Admitting: Oncology

## 2016-11-28 ENCOUNTER — Inpatient Hospital Stay: Payer: PPO | Attending: Oncology

## 2016-11-28 ENCOUNTER — Inpatient Hospital Stay: Payer: PPO

## 2016-11-28 VITALS — BP 134/68 | HR 56 | Temp 96.8°F | Resp 20 | Wt 198.2 lb

## 2016-11-28 DIAGNOSIS — E785 Hyperlipidemia, unspecified: Secondary | ICD-10-CM | POA: Diagnosis not present

## 2016-11-28 DIAGNOSIS — Z808 Family history of malignant neoplasm of other organs or systems: Secondary | ICD-10-CM | POA: Diagnosis not present

## 2016-11-28 DIAGNOSIS — G629 Polyneuropathy, unspecified: Secondary | ICD-10-CM | POA: Diagnosis not present

## 2016-11-28 DIAGNOSIS — I2510 Atherosclerotic heart disease of native coronary artery without angina pectoris: Secondary | ICD-10-CM | POA: Diagnosis not present

## 2016-11-28 DIAGNOSIS — Z79899 Other long term (current) drug therapy: Secondary | ICD-10-CM | POA: Diagnosis not present

## 2016-11-28 DIAGNOSIS — Z7952 Long term (current) use of systemic steroids: Secondary | ICD-10-CM | POA: Insufficient documentation

## 2016-11-28 DIAGNOSIS — I10 Essential (primary) hypertension: Secondary | ICD-10-CM

## 2016-11-28 DIAGNOSIS — M79675 Pain in left toe(s): Secondary | ICD-10-CM | POA: Insufficient documentation

## 2016-11-28 DIAGNOSIS — Z85828 Personal history of other malignant neoplasm of skin: Secondary | ICD-10-CM | POA: Diagnosis not present

## 2016-11-28 DIAGNOSIS — K760 Fatty (change of) liver, not elsewhere classified: Secondary | ICD-10-CM

## 2016-11-28 DIAGNOSIS — C61 Malignant neoplasm of prostate: Secondary | ICD-10-CM | POA: Insufficient documentation

## 2016-11-28 DIAGNOSIS — G4733 Obstructive sleep apnea (adult) (pediatric): Secondary | ICD-10-CM | POA: Insufficient documentation

## 2016-11-28 DIAGNOSIS — C7951 Secondary malignant neoplasm of bone: Secondary | ICD-10-CM | POA: Diagnosis not present

## 2016-11-28 DIAGNOSIS — Z87891 Personal history of nicotine dependence: Secondary | ICD-10-CM | POA: Insufficient documentation

## 2016-11-28 DIAGNOSIS — Z7982 Long term (current) use of aspirin: Secondary | ICD-10-CM | POA: Insufficient documentation

## 2016-11-28 DIAGNOSIS — Z803 Family history of malignant neoplasm of breast: Secondary | ICD-10-CM | POA: Insufficient documentation

## 2016-11-28 LAB — COMPREHENSIVE METABOLIC PANEL
ALT: 22 U/L (ref 17–63)
ANION GAP: 4 — AB (ref 5–15)
AST: 27 U/L (ref 15–41)
Albumin: 4 g/dL (ref 3.5–5.0)
Alkaline Phosphatase: 160 U/L — ABNORMAL HIGH (ref 38–126)
BILIRUBIN TOTAL: 0.9 mg/dL (ref 0.3–1.2)
BUN: 14 mg/dL (ref 6–20)
CALCIUM: 7.8 mg/dL — AB (ref 8.9–10.3)
CO2: 29 mmol/L (ref 22–32)
Chloride: 107 mmol/L (ref 101–111)
Creatinine, Ser: 0.89 mg/dL (ref 0.61–1.24)
GFR calc Af Amer: >60 mL/min (ref >60)
Glucose, Bld: 127 mg/dL — ABNORMAL HIGH (ref 65–99)
POTASSIUM: 4 mmol/L (ref 3.5–5.1)
Sodium: 140 mmol/L (ref 135–145)
TOTAL PROTEIN: 6.8 g/dL (ref 6.5–8.1)

## 2016-11-28 LAB — CBC WITH DIFFERENTIAL/PLATELET
Basophils Absolute: 0 10*3/uL (ref 0–0.1)
Basophils Relative: 1 %
Eosinophils Absolute: 0.2 10*3/uL (ref 0–0.7)
Eosinophils Relative: 4 %
HEMATOCRIT: 40.6 % (ref 40.0–52.0)
Hemoglobin: 13.8 g/dL (ref 13.0–18.0)
LYMPHS ABS: 1.9 10*3/uL (ref 1.0–3.6)
LYMPHS PCT: 45 %
MCH: 30.7 pg (ref 26.0–34.0)
MCHC: 34.1 g/dL (ref 32.0–36.0)
MCV: 90.1 fL (ref 80.0–100.0)
MONO ABS: 0.3 10*3/uL (ref 0.2–1.0)
MONOS PCT: 8 %
NEUTROS ABS: 1.8 10*3/uL (ref 1.4–6.5)
Neutrophils Relative %: 42 %
Platelets: 168 10*3/uL (ref 150–440)
RBC: 4.5 MIL/uL (ref 4.40–5.90)
RDW: 14.3 % (ref 11.5–14.5)
WBC: 4.3 10*3/uL (ref 3.8–10.6)

## 2016-11-28 LAB — PSA: Prostatic Specific Antigen: 0.95 ng/mL (ref 0.00–4.00)

## 2016-11-28 NOTE — Progress Notes (Signed)
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  INTERVAL HISTORY: Patient returns to clinic today for continuation of Xgeva and further evaluation. He is tolerating Zytiga well without significant side effects. He continues to have an  increased appetite and mild weight gain secondary to the prednisone. He also complains of left great toe pain. He denies injury. Otherwise he feels well and is asymptomatic. He does not complain of pain. He has no neurologic complaints. He denies any recent fevers. He has no chest pain or shortness of breath.  He denies any nausea, vomiting, constipation, or diarrhea. He has no urinary complaints. Patient feels at his baseline and offers no specific complaints today.  REVIEW OF SYSTEMS:   Review of Systems  Constitutional: Negative.  Negative for fever, malaise/fatigue and weight loss.  Respiratory: Negative.  Negative for cough and shortness of breath.   Cardiovascular: Negative.  Negative for chest pain and leg swelling.  Gastrointestinal: Negative.  Negative for abdominal pain.  Genitourinary: Negative.   Musculoskeletal: Positive for joint pain. Negative for back pain.       Tenderness in left great toe.  Skin: Negative.  Negative for rash.  Neurological: Negative.  Negative for sensory change and weakness.  Psychiatric/Behavioral: Negative.  The patient is not nervous/anxious.     As per HPI. Otherwise, a complete review of systems is negative.  PAST MEDICAL  HISTORY: Past Medical History:  Diagnosis Date  . 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 CAD (coronary artery disease), native coronary artery 2014   By CT lung (04/2013)   . Cataract   . HLD (hyperlipidemia)   . HTN (hypertension)   . Hyperglycemia   . NAFLD (nonalcoholic fatty liver disease) 08/2011   by abd [place] with increased LFTs  . OSA (obstructive sleep apnea)    did not tolerate CPAP  . Other benign neoplasm of connective and other soft tissue of unspecified site    Neurofibroma of lateral periorbital area  . Peripheral neuropathy   . Personal history of colonic adenomas 08/05/2012  . Pneumonia 01/2013   CAP LUL  . Restless legs syndrome (RLS)   . Sleep apnea    no cpap    PAST SURGICAL HISTORY: Past Surgical History:  Procedure Laterality Date  . COLONOSCOPY  07/2012   5 adenomas (tubular, TV, serrated), rec rpt 5 months [name])  . COLONOSCOPY  02/2013   residual adenomas, cecal AVM, rec rpt 1 yr [name])  . COLONOSCOPY  03/2014   no residual polyp, cecal AVM, rpt 3-4 yrs [name])  . ORIF ANKLE FRACTURE  11/29/00   Dr. Tamala Julian  . SKIN CANCER EXCISION  1997   left anterior neck    FAMILY HISTORY: Family History  Problem Relation Age of Onset  . Cancer Father        liver  . Alcohol abuse Father   . Liver cancer Father   . Cancer Mother        breast  . Breast cancer Mother   . Hypertension Brother  Half-brother  . Coronary artery disease Paternal Aunt        CABG  . Cancer Other        GM; Aunt - breast  . Colon cancer Neg Hx   . Rectal cancer Neg Hx   . Stomach cancer Neg Hx   . Esophageal cancer Neg Hx     ADVANCED DIRECTIVES (Y/N):  N  HEALTH MAINTENANCE: Social History  Substance Use Topics  . Smoking status: Former Smoker    Types: Cigarettes, Cigars    Quit date: 04/28/2002  . Smokeless tobacco: Never Used     Comment: rare cigar  . Alcohol use 2.4 oz/week    2 Standard drinks or  equivalent, 2 Shots of liquor per week     Comment: Weekends     Colonoscopy:  PAP:  Bone density:  Lipid panel:  No Known Allergies  Current Outpatient Prescriptions  Medication Sig Dispense Refill  . abiraterone Acetate (ZYTIGA) 250 MG tablet Take 4 tablets (1,000 mg total) by mouth daily. Take on an empty stomach 1 hour before or 2 hours after a meal 120 tablet 2  . amLODipine (NORVASC) 5 MG tablet Take 1 tablet (5 mg total) by mouth daily. 90 tablet 3  . aspirin EC 81 MG tablet Take 1 tablet (81 mg total) by mouth daily.    . cholecalciferol (VITAMIN D) 1000 UNITS tablet Take 2,000 Units by mouth daily.     . fish oil-omega-3 fatty acids 1000 MG capsule Take 2 g by mouth daily.     Marland Kitchen gabapentin (NEURONTIN) 300 MG capsule Take 1 capsule (300 mg total) by mouth at bedtime. 90 capsule 3  . lisinopril (PRINIVIL,ZESTRIL) 5 MG tablet Take 1 tablet (5 mg total) by mouth daily. 90 tablet 3  . Melatonin 10 MG CAPS Take 1 capsule by mouth as needed.    . Multiple Vitamins-Minerals (MULTIVITAMIN PO) Take 1 tablet by mouth daily.    . predniSONE (DELTASONE) 5 MG tablet Take 1 tablet (5 mg total) by mouth daily with breakfast. 30 tablet 5  . temazepam (RESTORIL) 15 MG capsule Take 1 capsule (15 mg total) by mouth at bedtime as needed for sleep. 30 capsule 0   No current facility-administered medications for this visit.     OBJECTIVE: Vitals:   11/28/16 0915  BP: 134/68  Pulse: (!) 56  Resp: 20  Temp: (!) 96.8 F (36 C)     Body mass index is 31.99 kg/m.    ECOG FS:0 - Asymptomatic  General: Well-developed, well-nourished, no acute distress. Eyes: Pink conjunctiva, anicteric sclera. Lungs: Clear to auscultation bilaterally. Heart: Regular rate and rhythm. No rubs, murmurs, or gallops. Abdomen: Soft, nontender, nondistended. No organomegaly noted, normoactive bowel sounds. Musculoskeletal: No edema, cyanosis, or clubbing. Neuro: Alert, answering all questions appropriately. Cranial  nerves grossly intact. Skin: No rashes or petechiae noted. Psych: Normal affect.   LAB RESULTS:  Lab Results  Component Value Date   NA 140 11/28/2016   K 4.0 11/28/2016   CL 107 11/28/2016   CO2 29 11/28/2016   GLUCOSE 127 (H) 11/28/2016   BUN 14 11/28/2016   CREATININE 0.89 11/28/2016   CALCIUM 7.8 (L) 11/28/2016   PROT 6.8 11/28/2016   ALBUMIN 4.0 11/28/2016   AST 27 11/28/2016   ALT 22 11/28/2016   ALKPHOS 160 (H) 11/28/2016   BILITOT 0.9 11/28/2016   GFRNONAA >60 11/28/2016   GFRAA >60 11/28/2016    Lab Results  Component Value Date  WBC 4.3 11/28/2016   NEUTROABS 1.8 11/28/2016   HGB 13.8 11/28/2016   HCT 40.6 11/28/2016   MCV 90.1 11/28/2016   PLT 168 11/28/2016     STUDIES: No results found.  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   PLAN:    1. 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 Prostate biopsy results revealed Gleason's 8 (4+4) adenocarcinoma. Continue Zytiga and prednisone as prescribed. Patient will not receive Delton See today given his hypocalcemia. He did not receive Xgeva last month due to hypocalcemia.  Return to clinic in 1 month for repeat laboratory work, consideration of Xgeva, and further evaluation.  2. Hypocalcemia: Continue calcium and vitamin D supplementation. No Xgeva as above. Monitor.   Approximately 30 minutes was spent in discussion of which greater than 50% was consultation.   Patient expressed understanding and was in agreement with this plan. He also understands that He can call clinic at any time with any questions, concerns, or complaints.   Cancer Staging Prostate cancer [facility]) Staging form: Prostate, AJCC 8th Edition - Clinical stage from 09/26/2016: Stage IVB (cTX, cN1, cM1c, PSA: 193) - Signed by Lloyd Huger, MD  on 09/26/2016   Jacquelin Hawking, NP   11/28/2016 10:15 AM

## 2016-11-28 NOTE — Progress Notes (Signed)
Patient denies any concerns today.  

## 2016-12-02 ENCOUNTER — Encounter: Payer: Self-pay | Admitting: Oncology

## 2016-12-23 NOTE — Progress Notes (Signed)
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  INTERVAL HISTORY: Patient returns to clinic today for continuation of Xgeva and further evaluation. He is tolerating Zytiga well without significant side effects. He currently feels well and is asymptomatic. He does not complain of pain. He has no neurologic complaints. He denies any recent fevers. He has no chest pain or shortness of breath.  He denies any nausea, vomiting, constipation, or diarrhea. He has no urinary complaints. Patient feels at his baseline and offers no specific complaints today.  REVIEW OF SYSTEMS:   Review of Systems  Constitutional: Negative.  Negative for fever, malaise/fatigue and weight loss.  Respiratory: Negative.  Negative for cough and shortness of breath.   Cardiovascular: Negative.  Negative for chest pain and leg swelling.  Gastrointestinal: Negative.  Negative for abdominal pain.  Genitourinary: Negative.  Negative for frequency and urgency.  Musculoskeletal: Negative.  Negative for back pain.  Skin: Negative.  Negative for rash.  Neurological: Negative.  Negative for sensory change and weakness.  Psychiatric/Behavioral: Negative.  The patient is not nervous/anxious.     As per HPI. Otherwise, a complete review of systems is negative.  PAST MEDICAL HISTORY: Past Medical History:  Diagnosis Date  . 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 CAD (coronary artery disease), native  coronary artery 2014   By CT lung (04/2013)   . Cataract   . HLD (hyperlipidemia)   . HTN (hypertension)   . Hyperglycemia   . NAFLD (nonalcoholic fatty liver disease) 08/2011   by abd [place] with increased LFTs  . OSA (obstructive sleep apnea)    did not tolerate CPAP  . Other benign neoplasm of connective and other soft tissue of unspecified site    Neurofibroma of lateral periorbital area  . Peripheral neuropathy   . Personal history of colonic adenomas 08/05/2012  . Pneumonia 01/2013   CAP LUL  . Restless legs syndrome (RLS)   . Sleep apnea    no cpap    PAST SURGICAL HISTORY: Past Surgical History:  Procedure Laterality Date  . COLONOSCOPY  07/2012   5 adenomas (tubular, TV, serrated), rec rpt 5 months [name])  . COLONOSCOPY  02/2013   residual adenomas, cecal AVM, rec rpt 1 yr [name])  . COLONOSCOPY  03/2014   no residual polyp, cecal AVM, rpt 3-4 yrs [name])  . ORIF ANKLE FRACTURE  11/29/00   Dr. Tamala Julian  . SKIN CANCER EXCISION  1997   left anterior neck    FAMILY HISTORY: Family History  Problem Relation Age of Onset  . Cancer Father        liver  . Alcohol abuse Father   . Liver cancer Father   . Cancer Mother        breast  . Breast cancer Mother   . Hypertension Brother        Half-brother  . Coronary artery disease Paternal Aunt        CABG  . Cancer Other        GM; Aunt - breast  . Colon cancer  Neg Hx   . Rectal cancer Neg Hx   . Stomach cancer Neg Hx   . Esophageal cancer Neg Hx     ADVANCED DIRECTIVES (Y/N):  N  HEALTH MAINTENANCE: Social History  Substance Use Topics  . Smoking status: Former Smoker    Types: Cigarettes, Cigars    Quit date: 04/28/2002  . Smokeless tobacco: Never Used     Comment: rare cigar  . Alcohol use 2.4 oz/week    2 Standard drinks or equivalent, 2 Shots of liquor per week     Comment: Weekends     Colonoscopy:  PAP:  Bone density:  Lipid panel:  No Known Allergies  Current Outpatient Prescriptions    Medication Sig Dispense Refill  . abiraterone Acetate (ZYTIGA) 250 MG tablet Take 4 tablets (1,000 mg total) by mouth daily. Take on an empty stomach 1 hour before or 2 hours after a meal 120 tablet 2  . amLODipine (NORVASC) 5 MG tablet Take 1 tablet (5 mg total) by mouth daily. 90 tablet 3  . aspirin EC 81 MG tablet Take 1 tablet (81 mg total) by mouth daily.    . cholecalciferol (VITAMIN D) 1000 UNITS tablet Take 2,000 Units by mouth daily.     . fish oil-omega-3 fatty acids 1000 MG capsule Take 2 g by mouth daily.     Marland Kitchen gabapentin (NEURONTIN) 300 MG capsule Take 1 capsule (300 mg total) by mouth at bedtime. 90 capsule 3  . lisinopril (PRINIVIL,ZESTRIL) 5 MG tablet Take 1 tablet (5 mg total) by mouth daily. 90 tablet 3  . Melatonin 10 MG CAPS Take 1 capsule by mouth as needed.    . Multiple Vitamins-Minerals (MULTIVITAMIN PO) Take 1 tablet by mouth daily.    . predniSONE (DELTASONE) 5 MG tablet Take 1 tablet (5 mg total) by mouth daily with breakfast. 30 tablet 5  . temazepam (RESTORIL) 15 MG capsule Take 1 capsule (15 mg total) by mouth at bedtime as needed for sleep. 30 capsule 0   No current facility-administered medications for this visit.    Facility-Administered Medications Ordered in Other Visits  Medication Dose Route Frequency Provider Last Rate Last Dose  . denosumab (XGEVA) injection 120 mg  120 mg Subcutaneous Once Lloyd Huger, MD        OBJECTIVE: Vitals:   12/26/16 0904  BP: 132/82  Pulse: (!) 52  Resp: 20  Temp: (!) 96.3 F (35.7 C)     Body mass index is 31.63 kg/m.    ECOG FS:0 - Asymptomatic  General: Well-developed, well-nourished, no acute distress. Eyes: Pink conjunctiva, anicteric sclera. Lungs: Clear to auscultation bilaterally. Heart: Regular rate and rhythm. No rubs, murmurs, or gallops. Abdomen: Soft, nontender, nondistended. No organomegaly noted, normoactive bowel sounds. Musculoskeletal: No edema, cyanosis, or clubbing. Neuro: Alert,  answering all questions appropriately. Cranial nerves grossly intact. Skin: No rashes or petechiae noted. Psych: Normal affect.   LAB RESULTS:  Lab Results  Component Value Date   NA 141 12/26/2016   K 4.1 12/26/2016   CL 110 12/26/2016   CO2 26 12/26/2016   GLUCOSE 98 12/26/2016   BUN 18 12/26/2016   CREATININE 1.01 12/26/2016   CALCIUM 8.3 (L) 12/26/2016   PROT 6.7 12/26/2016   ALBUMIN 3.8 12/26/2016   AST 24 12/26/2016   ALT 20 12/26/2016   ALKPHOS 109 12/26/2016   BILITOT 0.6 12/26/2016   GFRNONAA >60 12/26/2016   GFRAA >60 12/26/2016    Lab Results  Component Value Date  WBC 5.3 12/26/2016   NEUTROABS 2.6 12/26/2016   HGB 13.5 12/26/2016   HCT 39.1 (L) 12/26/2016   MCV 90.6 12/26/2016   PLT 174 12/26/2016     STUDIES: No results found.  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   PLAN:    1. 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 Prostate biopsy results revealed Gleason's 8 (4+4) adenocarcinoma. Continue Zytiga and prednisone as prescribed which can be continued until intolerable side effects or progression of disease. Patient's calcium levels have improved, therefore will proceed with Xgeva today. He has been instructed to continue his oral calcium supplementation. Return to clinic every 4 weeks for laboratory work and [place] and then in 3 months for further evaluation. 2. Hypocalcemia: Continue calcium and vitamin D supplementation. Proceed with Xgeva as above. Monitor.  Approximately 30 minutes was spent in discussion of which greater than 50% was consultation.   Patient expressed understanding and was in agreement with this plan. He also understands that He can call clinic at any time with any questions, concerns, or complaints.   Cancer Staging Prostate  cancer [facility]) Staging form: Prostate, AJCC 8th Edition - Clinical stage from 09/26/2016: Stage IVB (cTX, cN1, cM1c, PSA: 193) - Signed by Lloyd Huger, MD on 09/26/2016   Lloyd Huger, MD   12/26/2016 10:55 AM

## 2016-12-26 ENCOUNTER — Inpatient Hospital Stay: Payer: PPO

## 2016-12-26 ENCOUNTER — Inpatient Hospital Stay (HOSPITAL_BASED_OUTPATIENT_CLINIC_OR_DEPARTMENT_OTHER): Payer: PPO | Admitting: Oncology

## 2016-12-26 VITALS — BP 132/82 | HR 52 | Temp 96.3°F | Resp 20 | Wt 196.0 lb

## 2016-12-26 DIAGNOSIS — I2510 Atherosclerotic heart disease of native coronary artery without angina pectoris: Secondary | ICD-10-CM | POA: Diagnosis not present

## 2016-12-26 DIAGNOSIS — C61 Malignant neoplasm of prostate: Secondary | ICD-10-CM

## 2016-12-26 DIAGNOSIS — I10 Essential (primary) hypertension: Secondary | ICD-10-CM

## 2016-12-26 DIAGNOSIS — K760 Fatty (change of) liver, not elsewhere classified: Secondary | ICD-10-CM

## 2016-12-26 DIAGNOSIS — Z7982 Long term (current) use of aspirin: Secondary | ICD-10-CM

## 2016-12-26 DIAGNOSIS — Z808 Family history of malignant neoplasm of other organs or systems: Secondary | ICD-10-CM

## 2016-12-26 DIAGNOSIS — Z79899 Other long term (current) drug therapy: Secondary | ICD-10-CM | POA: Diagnosis not present

## 2016-12-26 DIAGNOSIS — Z7952 Long term (current) use of systemic steroids: Secondary | ICD-10-CM

## 2016-12-26 DIAGNOSIS — Z87891 Personal history of nicotine dependence: Secondary | ICD-10-CM

## 2016-12-26 DIAGNOSIS — G629 Polyneuropathy, unspecified: Secondary | ICD-10-CM | POA: Diagnosis not present

## 2016-12-26 DIAGNOSIS — Z803 Family history of malignant neoplasm of breast: Secondary | ICD-10-CM

## 2016-12-26 DIAGNOSIS — C7951 Secondary malignant neoplasm of bone: Secondary | ICD-10-CM

## 2016-12-26 DIAGNOSIS — Z85828 Personal history of other malignant neoplasm of skin: Secondary | ICD-10-CM

## 2016-12-26 DIAGNOSIS — G4733 Obstructive sleep apnea (adult) (pediatric): Secondary | ICD-10-CM

## 2016-12-26 DIAGNOSIS — E785 Hyperlipidemia, unspecified: Secondary | ICD-10-CM

## 2016-12-26 LAB — CBC WITH DIFFERENTIAL/PLATELET
BASOS ABS: 0 10*3/uL (ref 0–0.1)
BASOS PCT: 1 %
EOS PCT: 3 %
Eosinophils Absolute: 0.1 10*3/uL (ref 0–0.7)
HCT: 39.1 % — ABNORMAL LOW (ref 40.0–52.0)
Hemoglobin: 13.5 g/dL (ref 13.0–18.0)
Lymphocytes Relative: 39 %
Lymphs Abs: 2 10*3/uL (ref 1.0–3.6)
MCH: 31.2 pg (ref 26.0–34.0)
MCHC: 34.5 g/dL (ref 32.0–36.0)
MCV: 90.6 fL (ref 80.0–100.0)
MONO ABS: 0.4 10*3/uL (ref 0.2–1.0)
MONOS PCT: 8 %
Neutro Abs: 2.6 10*3/uL (ref 1.4–6.5)
Neutrophils Relative %: 49 %
PLATELETS: 174 10*3/uL (ref 150–440)
RBC: 4.31 MIL/uL — ABNORMAL LOW (ref 4.40–5.90)
RDW: 14.1 % (ref 11.5–14.5)
WBC: 5.3 10*3/uL (ref 3.8–10.6)

## 2016-12-26 LAB — COMPREHENSIVE METABOLIC PANEL
ALK PHOS: 109 U/L (ref 38–126)
ALT: 20 U/L (ref 17–63)
AST: 24 U/L (ref 15–41)
Albumin: 3.8 g/dL (ref 3.5–5.0)
Anion gap: 5 (ref 5–15)
BUN: 18 mg/dL (ref 6–20)
CALCIUM: 8.3 mg/dL — AB (ref 8.9–10.3)
CHLORIDE: 110 mmol/L (ref 101–111)
CO2: 26 mmol/L (ref 22–32)
Creatinine, Ser: 1.01 mg/dL (ref 0.61–1.24)
GFR calc Af Amer: >60 mL/min (ref >60)
GFR calc non Af Amer: >60 mL/min (ref >60)
GLUCOSE: 98 mg/dL (ref 65–99)
Potassium: 4.1 mmol/L (ref 3.5–5.1)
SODIUM: 141 mmol/L (ref 135–145)
Total Bilirubin: 0.6 mg/dL (ref 0.3–1.2)
Total Protein: 6.7 g/dL (ref 6.5–8.1)

## 2016-12-26 LAB — PSA: PROSTATIC SPECIFIC ANTIGEN: 0.64 ng/mL (ref 0.00–4.00)

## 2016-12-26 MED ORDER — DENOSUMAB 120 MG/1.7ML ~~LOC~~ SOLN: 120.0000 mg | Freq: Once | SUBCUTANEOUS | Status: DC

## 2016-12-26 NOTE — Patient Instructions (Signed)

## 2016-12-26 NOTE — Progress Notes (Signed)
Patient here today for follow up regarding prostate cancer. Patient reports pain in right side, made worse with standing and stretching movements.

## 2016-12-30 ENCOUNTER — Encounter: Payer: Self-pay | Admitting: Family Medicine

## 2016-12-31 NOTE — Telephone Encounter (Signed)
plz call and schedule office visit for back pain.

## 2016-12-31 NOTE — Telephone Encounter (Signed)
Lm on pts vm and advised him to contact office and schedule OV

## 2017-01-02 ENCOUNTER — Ambulatory Visit (INDEPENDENT_AMBULATORY_CARE_PROVIDER_SITE_OTHER): Payer: PPO | Admitting: Family Medicine

## 2017-01-02 ENCOUNTER — Ambulatory Visit (INDEPENDENT_AMBULATORY_CARE_PROVIDER_SITE_OTHER)
Admission: RE | Admit: 2017-01-02 | Discharge: 2017-01-02 | Disposition: A | Discharge status: Home / Self Care | Payer: PPO | Source: Ambulatory Visit | Attending: Family Medicine | Admitting: Family Medicine

## 2017-01-02 ENCOUNTER — Encounter: Payer: Self-pay | Admitting: Family Medicine

## 2017-01-02 VITALS — BP 124/64 | HR 48 | Temp 97.9°F | Wt 197.0 lb

## 2017-01-02 DIAGNOSIS — G4700 Insomnia, unspecified: Secondary | ICD-10-CM

## 2017-01-02 DIAGNOSIS — Z23 Encounter for immunization: Secondary | ICD-10-CM

## 2017-01-02 DIAGNOSIS — F5104 Psychophysiologic insomnia: Secondary | ICD-10-CM | POA: Insufficient documentation

## 2017-01-02 DIAGNOSIS — C7951 Secondary malignant neoplasm of bone: Secondary | ICD-10-CM | POA: Diagnosis not present

## 2017-01-02 DIAGNOSIS — M25551 Pain in right hip: Secondary | ICD-10-CM | POA: Diagnosis not present

## 2017-01-02 MED ORDER — TEMAZEPAM 30 MG PO CAPS: 30.0000 mg | ORAL_CAPSULE | Freq: Every evening | ORAL | 3 refills | Status: DC | PRN

## 2017-01-02 MED ORDER — TRAMADOL HCL 50 MG PO TABS: 50.0000 mg | ORAL_TABLET | Freq: Two times a day (BID) | ORAL | 3 refills | Status: DC | PRN

## 2017-01-02 NOTE — Progress Notes (Signed)
BP 124/64   Pulse (!) 48   Temp 97.9 F (36.6 C) (Oral)   Wt 197 lb (89.4 kg)   SpO2 97%   BMI 31.80 kg/m    CC: R flank pain Subjective:    Patient ID: Justin Ali, male    DOB: 1940-08-05, 76 y.o.   MRN: [id number]  HPI: Justin Ali is a 76 y.o. male presenting on 01/02/2017 for Flank Pain (denies any urinary s/s. )   R hip pain started several months ago worse over last several weeks. No radiation. Denies inciting trauma/injury. No groin pain. No urinary symptoms of dysuria, urgency, frequency. Worse with bending over or getting from chair after prolonged sitting. Has tried advil PM and tylenol arthritis without improvement in pain. Requests pain medicine.   Ongoing trouble with insomnia despite temazepam 15mg , 2 melatonin, aleve PM and gabapentin. He has even tried trazodone without success.   Recent dx metastatic prostate cancer to abdominal lymph nodes and bony disease followed by Dr Grayland Ormond and Dr Erlene Quan on xgeva and zytiga/prednisone.   He did have mild L hydroureter without obvious stone or mass on CT 08/2016.  He has know metastatic bony disease with increased metabolic activity over bilateral hips and femurs on latest PET scan 08/2016.   Relevant past medical, surgical, family and social history reviewed and updated as indicated. Interim medical history since our last visit reviewed. Allergies and medications reviewed and updated. Outpatient Medications Prior to Visit  Medication Sig Dispense Refill  . abiraterone Acetate (ZYTIGA) 250 MG tablet Take 4 tablets (1,000 mg total) by mouth daily. Take on an empty stomach 1 hour before or 2 hours after a meal 120 tablet 2  . amLODipine (NORVASC) 5 MG tablet Take 1 tablet (5 mg total) by mouth daily. 90 tablet 3  . aspirin EC 81 MG tablet Take 1 tablet (81 mg total) by mouth daily.    . cholecalciferol (VITAMIN D) 1000 UNITS tablet Take 2,000 Units by mouth daily.     . fish oil-omega-3 fatty acids 1000 MG capsule  Take 2 g by mouth daily.     Marland Kitchen gabapentin (NEURONTIN) 300 MG capsule Take 1 capsule (300 mg total) by mouth at bedtime. 90 capsule 3  . lisinopril (PRINIVIL,ZESTRIL) 5 MG tablet Take 1 tablet (5 mg total) by mouth daily. 90 tablet 3  . Melatonin 10 MG CAPS Take 1 capsule by mouth as needed.    . Multiple Vitamins-Minerals (MULTIVITAMIN PO) Take 1 tablet by mouth daily.    . predniSONE (DELTASONE) 5 MG tablet Take 1 tablet (5 mg total) by mouth daily with breakfast. 30 tablet 5  . temazepam (RESTORIL) 15 MG capsule Take 1 capsule (15 mg total) by mouth at bedtime as needed for sleep. 30 capsule 0   No facility-administered medications prior to visit.      Per HPI unless specifically indicated in ROS section below Review of Systems     Objective:    BP 124/64   Pulse (!) 48   Temp 97.9 F (36.6 C) (Oral)   Wt 197 lb (89.4 kg)   SpO2 97%   BMI 31.80 kg/m   Wt Readings from Last 3 Encounters:  01/02/17 197 lb (89.4 kg)  12/26/16 195 lb 15.8 oz (88.9 kg)  11/28/16 198 lb 3.1 oz (89.9 kg)    Physical Exam  Constitutional: He appears well-developed and well-nourished. No distress.  Abdominal: Soft. Bowel sounds are normal. He exhibits no distension and no mass.  There is no tenderness. There is no rebound and no guarding.  Musculoskeletal: He exhibits no edema.  No pain midline spine No paraspinous mm tenderness Neg SLR bilaterally. No pain with int/ext rotation at hip. Tender at R lateral hip along anterior inferionr iliac spine  Skin: Skin is warm and dry. No rash noted.  Psychiatric: He has a normal mood and affect.  Nursing note and vitals reviewed.     Assessment & Plan:   Problem List Items Addressed This Visit    Insomnia    Provided with sleep hygiene check list. Will increase temazepam dose to 30mg  nightly.  Pt aware not to mix controlled substances.       Right hip pain - Primary    In known bony metastatic disease from prostate cancer. Check pelvic films to  eval tender area. Will Rx tramadol 50mg  PRN pain. Pt agrees with plan.       Relevant Orders   DG Pelvis 1-2 Views    Other Visit Diagnoses    Need for influenza vaccination       Relevant Orders   Flu Vaccine QUAD 6+ mos PF IM (Fluarix Quad PF) (Completed)       Follow up plan: Return if symptoms worsen or fail to improve.  Ria Bush, MD

## 2017-01-02 NOTE — Assessment & Plan Note (Signed)
Provided with sleep hygiene check list. Will increase temazepam dose to 30mg  nightly.  Pt aware not to mix controlled substances.

## 2017-01-02 NOTE — Assessment & Plan Note (Addendum)
In known bony metastatic disease from prostate cancer. Check pelvic films to eval tender area. Will Rx tramadol 50mg  PRN pain. Pt agrees with plan.

## 2017-01-02 NOTE — Patient Instructions (Addendum)
Flu shot today. Xray today.  May try higher temazepam dose (30mg ) for sleep May try tramadol for pain as needed - let me know how this helps.   Sleep hygiene checklist: 1. Avoid naps during the day 2. Avoid stimulants such as caffeine and nicotine. Avoid bedtime alcohol (it can speed onset of sleep but the body's metabolism can cause awakenings). 3. All forms of exercise help ensure sound sleep - limit vigorous exercise to morning or late afternoon 4. Avoid food too close to bedtime including chocolate (which contains caffeine) 5. Soak up natural light 6. Establish regular bedtime routine. 7. Associate bed with sleep - avoid TV, computer or phone, reading while in bed. 8. Ensure pleasant, relaxing sleep environment - quiet, dark, cool room.

## 2017-01-04 ENCOUNTER — Encounter: Payer: Self-pay | Admitting: Family Medicine

## 2017-01-07 ENCOUNTER — Encounter: Payer: Self-pay | Admitting: Oncology

## 2017-01-08 ENCOUNTER — Telehealth: Payer: Self-pay | Admitting: *Deleted

## 2017-01-08 ENCOUNTER — Other Ambulatory Visit: Payer: Self-pay | Admitting: *Deleted

## 2017-01-08 DIAGNOSIS — C61 Malignant neoplasm of prostate: Secondary | ICD-10-CM

## 2017-01-08 DIAGNOSIS — C7951 Secondary malignant neoplasm of bone: Secondary | ICD-10-CM

## 2017-01-08 MED ORDER — HYDROCODONE-ACETAMINOPHEN 5-325 MG PO TABS: 1.0000 | ORAL_TABLET | ORAL | 0 refills | Status: DC | PRN

## 2017-01-08 NOTE — Telephone Encounter (Signed)
Patient notified of orders for Bone Scan and Hydrocodone

## 2017-01-15 ENCOUNTER — Encounter
Admission: RE | Admit: 2017-01-15 | Discharge: 2017-01-15 | Disposition: A | Discharge status: Home / Self Care | Payer: PPO | Source: Ambulatory Visit | Attending: Oncology | Admitting: Oncology

## 2017-01-15 ENCOUNTER — Ambulatory Visit
Admission: RE | Admit: 2017-01-15 | Discharge: 2017-01-15 | Disposition: A | Discharge status: Home / Self Care | Payer: PPO | Source: Ambulatory Visit | Attending: Oncology | Admitting: Oncology

## 2017-01-15 DIAGNOSIS — C7951 Secondary malignant neoplasm of bone: Secondary | ICD-10-CM | POA: Insufficient documentation

## 2017-01-15 DIAGNOSIS — C61 Malignant neoplasm of prostate: Secondary | ICD-10-CM | POA: Insufficient documentation

## 2017-01-15 MED ORDER — TECHNETIUM TC 99M MEDRONATE IV KIT
25.0000 | PACK | Freq: Once | INTRAVENOUS | Status: AC | PRN
  Administered 2017-01-15: 23.28 via INTRAVENOUS

## 2017-01-23 ENCOUNTER — Other Ambulatory Visit: Payer: Self-pay

## 2017-01-23 ENCOUNTER — Inpatient Hospital Stay: Payer: PPO | Attending: Oncology

## 2017-01-23 ENCOUNTER — Inpatient Hospital Stay: Payer: PPO

## 2017-01-23 VITALS — Resp 20

## 2017-01-23 DIAGNOSIS — C7951 Secondary malignant neoplasm of bone: Secondary | ICD-10-CM | POA: Insufficient documentation

## 2017-01-23 DIAGNOSIS — C61 Malignant neoplasm of prostate: Secondary | ICD-10-CM

## 2017-01-23 LAB — CBC WITH DIFFERENTIAL/PLATELET
BASOS ABS: 0 10*3/uL (ref 0–0.1)
BASOS PCT: 1 %
Eosinophils Absolute: 0.2 10*3/uL (ref 0–0.7)
Eosinophils Relative: 4 %
HEMATOCRIT: 39.8 % — AB (ref 40.0–52.0)
HEMOGLOBIN: 14.1 g/dL (ref 13.0–18.0)
LYMPHS PCT: 40 %
Lymphs Abs: 1.8 10*3/uL (ref 1.0–3.6)
MCH: 31.8 pg (ref 26.0–34.0)
MCHC: 35.3 g/dL (ref 32.0–36.0)
MCV: 89.9 fL (ref 80.0–100.0)
Monocytes Absolute: 0.3 10*3/uL (ref 0.2–1.0)
Monocytes Relative: 7 %
NEUTROS ABS: 2.2 10*3/uL (ref 1.4–6.5)
NEUTROS PCT: 48 %
Platelets: 172 10*3/uL (ref 150–440)
RBC: 4.42 MIL/uL (ref 4.40–5.90)
RDW: 13.6 % (ref 11.5–14.5)
WBC: 4.6 10*3/uL (ref 3.8–10.6)

## 2017-01-23 LAB — COMPREHENSIVE METABOLIC PANEL
ALBUMIN: 4.1 g/dL (ref 3.5–5.0)
ALT: 21 U/L (ref 17–63)
AST: 25 U/L (ref 15–41)
Alkaline Phosphatase: 92 U/L (ref 38–126)
Anion gap: 6 (ref 5–15)
BUN: 17 mg/dL (ref 6–20)
CHLORIDE: 106 mmol/L (ref 101–111)
CO2: 30 mmol/L (ref 22–32)
Calcium: 9.1 mg/dL (ref 8.9–10.3)
Creatinine, Ser: 0.99 mg/dL (ref 0.61–1.24)
GFR calc Af Amer: >60 mL/min (ref >60)
GFR calc non Af Amer: >60 mL/min (ref >60)
GLUCOSE: 98 mg/dL (ref 65–99)
POTASSIUM: 4.4 mmol/L (ref 3.5–5.1)
Sodium: 142 mmol/L (ref 135–145)
Total Bilirubin: 0.7 mg/dL (ref 0.3–1.2)
Total Protein: 6.9 g/dL (ref 6.5–8.1)

## 2017-01-23 LAB — PSA: PROSTATIC SPECIFIC ANTIGEN: 0.67 ng/mL (ref 0.00–4.00)

## 2017-01-23 MED ORDER — DENOSUMAB 120 MG/1.7ML ~~LOC~~ SOLN
120.0000 mg | Freq: Once | SUBCUTANEOUS | Status: AC
  Administered 2017-01-23: 120 mg via SUBCUTANEOUS
  Filled 2017-01-23: qty 1.7

## 2017-01-23 NOTE — Patient Instructions (Signed)

## 2017-02-18 ENCOUNTER — Telehealth: Payer: Self-pay | Admitting: Oncology

## 2017-02-18 NOTE — Telephone Encounter (Signed)
Oral Oncology Patient Advocate Encounter   Received information from [employer] that patients co-pay is $521.20. 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    [place] Patient Advocate [phone] 02/18/2017 10:03 AM

## 2017-02-18 NOTE — Telephone Encounter (Signed)
Oral Oncology Patient Advocate Encounter  Patient will be here Friday to fill out paper work for [employer] assistance thru [employer]. He will bring 1040B and pharmacy expenses from Medicare.    [place] Patient Advocate [phone] 02/18/2017 11:44 AM

## 2017-02-19 NOTE — Telephone Encounter (Signed)
Oral Oncology Patient Advocate Encounter  Patient is seeing Dr. Grayland Ormond in [facility] Friday 02/20/2017. Shirlean Mylar will have him sign and get other paper work needed for his Insurance underwriter.   [place] Patient Advocate [phone] 02/19/2017 11:20 AM

## 2017-02-20 ENCOUNTER — Inpatient Hospital Stay: Payer: PPO | Attending: Oncology

## 2017-02-20 ENCOUNTER — Inpatient Hospital Stay: Payer: PPO

## 2017-02-20 VITALS — BP 153/85 | HR 48 | Temp 96.8°F | Resp 18

## 2017-02-20 DIAGNOSIS — Z79899 Other long term (current) drug therapy: Secondary | ICD-10-CM | POA: Insufficient documentation

## 2017-02-20 DIAGNOSIS — C61 Malignant neoplasm of prostate: Secondary | ICD-10-CM

## 2017-02-20 DIAGNOSIS — C7951 Secondary malignant neoplasm of bone: Secondary | ICD-10-CM | POA: Diagnosis not present

## 2017-02-20 LAB — CBC WITH DIFFERENTIAL/PLATELET
BASOS ABS: 0.1 10*3/uL (ref 0–0.1)
Basophils Relative: 1 %
EOS ABS: 0.2 10*3/uL (ref 0–0.7)
EOS PCT: 4 %
HCT: 40.8 % (ref 40.0–52.0)
Hemoglobin: 14.1 g/dL (ref 13.0–18.0)
LYMPHS ABS: 1.8 10*3/uL (ref 1.0–3.6)
LYMPHS PCT: 35 %
MCH: 31.1 pg (ref 26.0–34.0)
MCHC: 34.5 g/dL (ref 32.0–36.0)
MCV: 90.3 fL (ref 80.0–100.0)
MONO ABS: 0.4 10*3/uL (ref 0.2–1.0)
Monocytes Relative: 8 %
Neutro Abs: 2.8 10*3/uL (ref 1.4–6.5)
Neutrophils Relative %: 52 %
PLATELETS: 171 10*3/uL (ref 150–440)
RBC: 4.52 MIL/uL (ref 4.40–5.90)
RDW: 13.8 % (ref 11.5–14.5)
WBC: 5.2 10*3/uL (ref 3.8–10.6)

## 2017-02-20 LAB — COMPREHENSIVE METABOLIC PANEL
ALT: 24 U/L (ref 17–63)
ANION GAP: 7 (ref 5–15)
AST: 25 U/L (ref 15–41)
Albumin: 4 g/dL (ref 3.5–5.0)
Alkaline Phosphatase: 83 U/L (ref 38–126)
BUN: 15 mg/dL (ref 6–20)
CHLORIDE: 103 mmol/L (ref 101–111)
CO2: 30 mmol/L (ref 22–32)
Calcium: 8.6 mg/dL — ABNORMAL LOW (ref 8.9–10.3)
Creatinine, Ser: 0.93 mg/dL (ref 0.61–1.24)
Glucose, Bld: 106 mg/dL — ABNORMAL HIGH (ref 65–99)
POTASSIUM: 3.9 mmol/L (ref 3.5–5.1)
Sodium: 140 mmol/L (ref 135–145)
Total Bilirubin: 0.5 mg/dL (ref 0.3–1.2)
Total Protein: 6.8 g/dL (ref 6.5–8.1)

## 2017-02-20 LAB — PSA: PROSTATIC SPECIFIC ANTIGEN: 0.54 ng/mL (ref 0.00–4.00)

## 2017-02-20 MED ORDER — DENOSUMAB 120 MG/1.7ML ~~LOC~~ SOLN
120.0000 mg | Freq: Once | SUBCUTANEOUS | Status: AC
  Administered 2017-02-20: 120 mg via SUBCUTANEOUS
  Filled 2017-02-20: qty 1.7

## 2017-02-20 NOTE — Patient Instructions (Signed)

## 2017-02-23 NOTE — Telephone Encounter (Signed)
Oral Oncology Patient Advocate Encounter  Received notification from [place] Patient [facility] that patient has been denied enrollment into their program to receive Zytiga from the drug manufacturer. I called patient to let him know.    Patient knows to call the office with questions or concerns.  Oral Oncology Clinic will continue to follow.  [place] Patient Advocate [phone] 02/23/2017 4:44 PM

## 2017-02-23 NOTE — Telephone Encounter (Signed)
Oral Oncology Patient Advocate Encounter   Called [facility] to check on patients application. Per Larena Glassman P. They did not have it so I faxed again to them. I have conformation they got it on 02/20/2017.     [place] Patient Advocate [phone] 02/23/2017 9:52 AM

## 2017-03-10 ENCOUNTER — Telehealth: Payer: Self-pay | Admitting: Oncology

## 2017-03-10 ENCOUNTER — Telehealth: Payer: Self-pay | Admitting: Pharmacist

## 2017-03-10 DIAGNOSIS — C61 Malignant neoplasm of prostate: Secondary | ICD-10-CM

## 2017-03-10 MED ORDER — PREDNISONE 5 MG PO TABS: 5.0000 mg | ORAL_TABLET | Freq: Every day | ORAL | 5 refills | Status: DC

## 2017-03-10 MED ORDER — ABIRATERONE ACETATE 250 MG PO TABS: 1000.0000 mg | ORAL_TABLET | Freq: Every day | ORAL | 2 refills | Status: DC

## 2017-03-10 NOTE — Telephone Encounter (Signed)
Oral Oncology Patient Advocate Encounter   Left patient a message to call me. Going to see if patient would like to fill his ZYTIGA thru [facility]. Will wait to hear back from him. He is filling thru [employer] now.   [place] Patient Advocate [phone] 03/10/2017 11:46 AM

## 2017-03-10 NOTE — Telephone Encounter (Signed)
Oral Oncology Pharmacist Encounter  Received refill prescription for Zytiga for the treatment of metastatic prostate cancer in conjunction with prednisone, planned duration until disease progression or unacceptable drug toxicity. Medication started 09/2016.  CMP from 02/20/17 assessed, no relevant lab abnormalities. BP at last OV was wnl. Prescription dose and frequency assessed.   Current medication list in Epic reviewed, no relevant DDIs with Zytiga identified.  Prescriptions for Zytiga and prednisone has been e-scribed to the [facility] will continue to follow patient.   Darl Pikes, PharmD, BCPS Hematology/Oncology Clinical Pharmacist [facility] [phone]  03/10/2017 3:55 PM

## 2017-03-16 NOTE — Telephone Encounter (Signed)
Oral Chemotherapy Pharmacist Encounter  I spoke with patient for overview of refill prescription for Zytigafor the treatment of metastatic prostate cancer for planned duration of until disease progression or unacceptable drug toxicity. Medication started 09/2016.  Pt was previously filling at an outside pharmacy but the medication is now being filled at [facility]. Pt is doing well.  Reviewed administration, dosing, side effects, monitoring, drug-food interactions, safe handling, storage, and disposal. Patient will take 4 tablets (1,000 mg total) daily by mouth. Take on an empty stomach 1 hour before or 2 hours after a meal.  Side effects include but not limited to: N/V/D, fatigue, hot flashes. He stated that he has not had an issues with the Zytiga since he has started.  Reviewed with patient importance of keeping a medication schedule and plan for any missed doses.  Mr. Gambale voiced understanding and appreciation. All questions answered.  Patient knows to call the office with questions or concerns. Oral Oncology Clinic will continue to follow.   Darl Pikes, PharmD, BCPS Hematology/Oncology Clinical Pharmacist [facility] [phone]  03/16/2017 4:09 PM

## 2017-03-20 ENCOUNTER — Other Ambulatory Visit: Payer: PPO

## 2017-03-20 ENCOUNTER — Ambulatory Visit: Payer: PPO | Admitting: Oncology

## 2017-03-20 ENCOUNTER — Ambulatory Visit: Payer: PPO

## 2017-03-22 MED FILL — predniSONE 5 MG TABS: 5 | 30 days supply | Qty: 30 | Fill #0

## 2017-03-23 ENCOUNTER — Telehealth: Payer: Self-pay | Admitting: Oncology

## 2017-03-23 MED FILL — ZYTIGA 250 MG TABLET: 250 | 30 days supply | Qty: 120 | Fill #0

## 2017-03-23 NOTE — Telephone Encounter (Addendum)
Oral Oncology Patient Advocate Encounter  Patient will start getting his Zytiga & Prednisone from [facility]. Called [facility] with credit card information. Emailed them to please mail out.    [place] Patient Advocate [phone] 03/23/2017 12:19 PM

## 2017-03-24 NOTE — Progress Notes (Signed)
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  INTERVAL HISTORY: Patient returns to clinic today for further evaluation and continuation of Xgeva. He continues to tolerate Zytiga well without significant side effects. He currently feels well and is asymptomatic. He does not complain of pain. He has no neurologic complaints. He denies any recent fevers. He has no chest pain or shortness of breath.  He denies any nausea, vomiting, constipation, or diarrhea. He has no urinary complaints. Patient feels at his baseline and offers no specific complaints today.  REVIEW OF SYSTEMS:   Review of Systems  Constitutional: Negative.  Negative for fever, malaise/fatigue and weight loss.  Respiratory: Negative.  Negative for cough and shortness of breath.   Cardiovascular: Negative.  Negative for chest pain and leg swelling.  Gastrointestinal: Negative.  Negative for abdominal pain.  Genitourinary: Negative.  Negative for frequency and urgency.  Musculoskeletal: Negative.  Negative for back pain.  Skin: Negative.  Negative for rash.  Neurological: Negative.  Negative for sensory change and weakness.  Psychiatric/Behavioral: Negative.  The patient is not nervous/anxious.     As per HPI. Otherwise, a complete review of systems is negative.  PAST MEDICAL HISTORY: Past Medical History:  Diagnosis Date  . 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 CAD (coronary artery disease),  native coronary artery 2014   By CT lung (04/2013)   . Cataract   . HLD (hyperlipidemia)   . HTN (hypertension)   . Hyperglycemia   . NAFLD (nonalcoholic fatty liver disease) 08/2011   by abd [place] with increased LFTs  . OSA (obstructive sleep apnea)    did not tolerate CPAP  . Other benign neoplasm of connective and other soft tissue of unspecified site    Neurofibroma of lateral periorbital area  . Peripheral neuropathy   . Personal history of colonic adenomas 08/05/2012  . Pneumonia 01/2013   CAP LUL  . Restless legs syndrome (RLS)   . Sleep apnea    no cpap    PAST SURGICAL HISTORY: Past Surgical History:  Procedure Laterality Date  . COLONOSCOPY  07/2012   5 adenomas (tubular, TV, serrated), rec rpt 5 months [name])  . COLONOSCOPY  02/2013   residual adenomas, cecal AVM, rec rpt 1 yr [name])  . COLONOSCOPY  03/2014   no residual polyp, cecal AVM, rpt 3-4 yrs [name])  . ORIF ANKLE FRACTURE  11/29/00   Dr. Tamala Julian  . SKIN CANCER EXCISION  1997   left anterior neck    FAMILY HISTORY: Family History  Problem Relation Age of Onset  . Cancer Father        liver  . Alcohol abuse Father   . Liver cancer Father   . Cancer Mother        breast  . Breast cancer Mother   . Hypertension Brother        Half-brother  . Coronary artery disease Paternal Aunt        CABG  . Cancer Other        GM; Aunt - breast  . Colon  cancer Neg Hx   . Rectal cancer Neg Hx   . Stomach cancer Neg Hx   . Esophageal cancer Neg Hx     ADVANCED DIRECTIVES (Y/N):  N  HEALTH MAINTENANCE: Social History   Tobacco Use  . Smoking status: Former Smoker    Types: Cigarettes, Cigars    Last attempt to quit: 04/28/2002    Years since quitting: 14.9  . Smokeless tobacco: Never Used  . Tobacco comment: rare cigar  Substance Use Topics  . Alcohol use: Yes    Alcohol/week: 2.4 oz    Types: 2 Standard drinks or equivalent, 2 Shots of liquor per week    Comment: Weekends  . Drug use: No      Colonoscopy:  PAP:  Bone density:  Lipid panel:  No Known Allergies  Current Outpatient Medications  Medication Sig Dispense Refill  . abiraterone acetate (ZYTIGA) 250 MG tablet Take 4 tablets (1,000 mg total) daily by mouth. Take on an empty stomach 1 hour before or 2 hours after a meal 120 tablet 2  . amLODipine (NORVASC) 5 MG tablet Take 1 tablet (5 mg total) by mouth daily. 90 tablet 3  . aspirin EC 81 MG tablet Take 1 tablet (81 mg total) by mouth daily.    . cholecalciferol (VITAMIN D) 1000 UNITS tablet Take 2,000 Units by mouth daily.     . fish oil-omega-3 fatty acids 1000 MG capsule Take 2 g by mouth daily.     Marland Kitchen gabapentin (NEURONTIN) 300 MG capsule Take 1 capsule (300 mg total) by mouth at bedtime. 90 capsule 3  . HYDROcodone-acetaminophen (NORCO/VICODIN) 5-325 MG tablet Take 1 tablet by mouth every 4 (four) hours as needed for moderate pain. 60 tablet 0  . lisinopril (PRINIVIL,ZESTRIL) 5 MG tablet Take 1 tablet (5 mg total) by mouth daily. 90 tablet 3  . Melatonin 10 MG CAPS Take 1 capsule by mouth as needed.    . Multiple Vitamins-Minerals (MULTIVITAMIN PO) Take 1 tablet by mouth daily.    . predniSONE (DELTASONE) 5 MG tablet Take 1 tablet (5 mg total) daily with breakfast by mouth. 30 tablet 5  . temazepam (RESTORIL) 30 MG capsule Take 1 capsule (30 mg total) by mouth at bedtime as needed for sleep. 30 capsule 3  . traMADol (ULTRAM) 50 MG tablet Take 1 tablet (50 mg total) by mouth 2 (two) times daily as needed. 30 tablet 3   No current facility-administered medications for this visit.     OBJECTIVE: Vitals:   03/27/17 0916  BP: (!) 157/79  Pulse: (!) 51  Resp: 20  Temp: 97.9 F (36.6 C)     Body mass index is 32.27 kg/m.    ECOG FS:0 - Asymptomatic  General: Well-developed, well-nourished, no acute distress. Eyes: Pink conjunctiva, anicteric sclera. Lungs: Clear to auscultation bilaterally. Heart: Regular rate and rhythm. No rubs, murmurs, or  gallops. Abdomen: Soft, nontender, nondistended. No organomegaly noted, normoactive bowel sounds. Musculoskeletal: No edema, cyanosis, or clubbing. Neuro: Alert, answering all questions appropriately. Cranial nerves grossly intact. Skin: No rashes or petechiae noted. Psych: Normal affect.   LAB RESULTS:  Lab Results  Component Value Date   NA 143 03/27/2017   K 3.5 03/27/2017   CL 107 03/27/2017   CO2 29 03/27/2017   GLUCOSE 94 03/27/2017   BUN 16 03/27/2017   CREATININE 0.97 03/27/2017   CALCIUM 9.4 03/27/2017   PROT 7.0 03/27/2017   ALBUMIN 4.0 03/27/2017   AST 24 03/27/2017   ALT  23 03/27/2017   ALKPHOS 67 03/27/2017   BILITOT 0.8 03/27/2017   GFRNONAA >60 03/27/2017   GFRAA >60 03/27/2017    Lab Results  Component Value Date   WBC 4.6 03/27/2017   NEUTROABS 2.4 03/27/2017   HGB 13.4 03/27/2017   HCT 38.6 (L) 03/27/2017   MCV 90.2 03/27/2017   PLT 171 03/27/2017     STUDIES: No results found.  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   PLAN:    1. 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 Prostate biopsy results revealed Gleason's 8 (4+4) adenocarcinoma.  His most recent bone scan on January 15, 2017 revealed improvement of his bony metastasis.  Patient's PSA is also trending down.  Continue Zytiga and prednisone as prescribed which can be continued until intolerable side effects or progression of disease. Patient's calcium levels have improved, therefore will proceed with Xgeva today. He has been instructed to continue his oral calcium supplementation. Return to clinic every 4 weeks for laboratory work and [place] [facility] and then in 3 months for further evaluation. 2. Hypocalcemia: Improved.  Continue calcium and vitamin D supplementation. Proceed  with Xgeva as above. Monitor.  Approximately 30 minutes was spent in discussion of which greater than 50% was consultation.   Patient expressed understanding and was in agreement with this plan. He also understands that He can call clinic at any time with any questions, concerns, or complaints.   Cancer Staging Prostate cancer [facility]) Staging form: Prostate, AJCC 8th Edition - Clinical stage from 09/26/2016: Stage IVB (cTX, cN1, cM1c, PSA: 193) - Signed by Lloyd Huger, MD on 09/26/2016   Lloyd Huger, MD   03/29/2017 8:36 AM

## 2017-03-27 ENCOUNTER — Inpatient Hospital Stay: Payer: PPO

## 2017-03-27 ENCOUNTER — Other Ambulatory Visit: Payer: Self-pay

## 2017-03-27 ENCOUNTER — Inpatient Hospital Stay: Payer: PPO | Attending: Oncology | Admitting: Oncology

## 2017-03-27 ENCOUNTER — Encounter: Payer: Self-pay | Admitting: Oncology

## 2017-03-27 VITALS — BP 157/79 | HR 51 | Temp 97.9°F | Resp 20 | Wt 200.0 lb

## 2017-03-27 DIAGNOSIS — Z803 Family history of malignant neoplasm of breast: Secondary | ICD-10-CM | POA: Diagnosis not present

## 2017-03-27 DIAGNOSIS — R590 Localized enlarged lymph nodes: Secondary | ICD-10-CM | POA: Insufficient documentation

## 2017-03-27 DIAGNOSIS — I10 Essential (primary) hypertension: Secondary | ICD-10-CM | POA: Diagnosis not present

## 2017-03-27 DIAGNOSIS — Z79899 Other long term (current) drug therapy: Secondary | ICD-10-CM | POA: Diagnosis not present

## 2017-03-27 DIAGNOSIS — Z7982 Long term (current) use of aspirin: Secondary | ICD-10-CM | POA: Diagnosis not present

## 2017-03-27 DIAGNOSIS — C61 Malignant neoplasm of prostate: Secondary | ICD-10-CM | POA: Diagnosis not present

## 2017-03-27 DIAGNOSIS — Z7952 Long term (current) use of systemic steroids: Secondary | ICD-10-CM | POA: Diagnosis not present

## 2017-03-27 DIAGNOSIS — C7951 Secondary malignant neoplasm of bone: Secondary | ICD-10-CM | POA: Insufficient documentation

## 2017-03-27 DIAGNOSIS — E785 Hyperlipidemia, unspecified: Secondary | ICD-10-CM | POA: Insufficient documentation

## 2017-03-27 DIAGNOSIS — Z85828 Personal history of other malignant neoplasm of skin: Secondary | ICD-10-CM | POA: Insufficient documentation

## 2017-03-27 DIAGNOSIS — I2510 Atherosclerotic heart disease of native coronary artery without angina pectoris: Secondary | ICD-10-CM | POA: Insufficient documentation

## 2017-03-27 DIAGNOSIS — Z79818 Long term (current) use of other agents affecting estrogen receptors and estrogen levels: Secondary | ICD-10-CM | POA: Diagnosis not present

## 2017-03-27 DIAGNOSIS — Z808 Family history of malignant neoplasm of other organs or systems: Secondary | ICD-10-CM | POA: Insufficient documentation

## 2017-03-27 DIAGNOSIS — Z87891 Personal history of nicotine dependence: Secondary | ICD-10-CM | POA: Diagnosis not present

## 2017-03-27 DIAGNOSIS — K760 Fatty (change of) liver, not elsewhere classified: Secondary | ICD-10-CM | POA: Diagnosis not present

## 2017-03-27 LAB — COMPREHENSIVE METABOLIC PANEL
ALK PHOS: 67 U/L (ref 38–126)
ALT: 23 U/L (ref 17–63)
AST: 24 U/L (ref 15–41)
Albumin: 4 g/dL (ref 3.5–5.0)
Anion gap: 7 (ref 5–15)
BUN: 16 mg/dL (ref 6–20)
CALCIUM: 9.4 mg/dL (ref 8.9–10.3)
CO2: 29 mmol/L (ref 22–32)
CREATININE: 0.97 mg/dL (ref 0.61–1.24)
Chloride: 107 mmol/L (ref 101–111)
Glucose, Bld: 94 mg/dL (ref 65–99)
Potassium: 3.5 mmol/L (ref 3.5–5.1)
Sodium: 143 mmol/L (ref 135–145)
Total Bilirubin: 0.8 mg/dL (ref 0.3–1.2)
Total Protein: 7 g/dL (ref 6.5–8.1)

## 2017-03-27 LAB — CBC WITH DIFFERENTIAL/PLATELET
BASOS PCT: 1 %
Basophils Absolute: 0 10*3/uL (ref 0–0.1)
EOS ABS: 0.1 10*3/uL (ref 0–0.7)
Eosinophils Relative: 2 %
HCT: 38.6 % — ABNORMAL LOW (ref 40.0–52.0)
HEMOGLOBIN: 13.4 g/dL (ref 13.0–18.0)
Lymphocytes Relative: 36 %
Lymphs Abs: 1.6 10*3/uL (ref 1.0–3.6)
MCH: 31.3 pg (ref 26.0–34.0)
MCHC: 34.7 g/dL (ref 32.0–36.0)
MCV: 90.2 fL (ref 80.0–100.0)
MONOS PCT: 9 %
Monocytes Absolute: 0.4 10*3/uL (ref 0.2–1.0)
NEUTROS PCT: 52 %
Neutro Abs: 2.4 10*3/uL (ref 1.4–6.5)
PLATELETS: 171 10*3/uL (ref 150–440)
RBC: 4.27 MIL/uL — ABNORMAL LOW (ref 4.40–5.90)
RDW: 13.4 % (ref 11.5–14.5)
WBC: 4.6 10*3/uL (ref 3.8–10.6)

## 2017-03-27 MED ORDER — DENOSUMAB 120 MG/1.7ML ~~LOC~~ SOLN
120.0000 mg | Freq: Once | SUBCUTANEOUS | Status: AC
  Administered 2017-03-27: 120 mg via SUBCUTANEOUS

## 2017-03-27 NOTE — Progress Notes (Signed)
Patient denies any concerns today.  

## 2017-03-28 LAB — PSA: Prostatic Specific Antigen: 0.53 ng/mL (ref 0.00–4.00)

## 2017-04-16 MED FILL — ZYTIGA 250 MG TABLET: 250 | 30 days supply | Qty: 120 | Fill #1

## 2017-04-16 MED FILL — predniSONE 5 MG TABS: 5 | 30 days supply | Qty: 30 | Fill #1

## 2017-04-24 ENCOUNTER — Inpatient Hospital Stay: Payer: PPO

## 2017-04-24 ENCOUNTER — Encounter: Payer: Self-pay | Admitting: Internal Medicine

## 2017-04-24 ENCOUNTER — Inpatient Hospital Stay: Payer: PPO | Attending: Oncology

## 2017-04-24 VITALS — BP 162/78 | HR 74 | Temp 97.3°F | Resp 20

## 2017-04-24 DIAGNOSIS — C7951 Secondary malignant neoplasm of bone: Secondary | ICD-10-CM | POA: Diagnosis not present

## 2017-04-24 DIAGNOSIS — Z79818 Long term (current) use of other agents affecting estrogen receptors and estrogen levels: Secondary | ICD-10-CM | POA: Insufficient documentation

## 2017-04-24 DIAGNOSIS — Z79899 Other long term (current) drug therapy: Secondary | ICD-10-CM | POA: Diagnosis not present

## 2017-04-24 DIAGNOSIS — C61 Malignant neoplasm of prostate: Secondary | ICD-10-CM | POA: Insufficient documentation

## 2017-04-24 LAB — CBC WITH DIFFERENTIAL/PLATELET
BASOS ABS: 0.1 10*3/uL (ref 0–0.1)
BASOS PCT: 1 %
Eosinophils Absolute: 0.2 10*3/uL (ref 0–0.7)
Eosinophils Relative: 3 %
HCT: 41.1 % (ref 40.0–52.0)
HEMOGLOBIN: 14.2 g/dL (ref 13.0–18.0)
Lymphocytes Relative: 33 %
Lymphs Abs: 1.9 10*3/uL (ref 1.0–3.6)
MCH: 31.1 pg (ref 26.0–34.0)
MCHC: 34.5 g/dL (ref 32.0–36.0)
MCV: 90 fL (ref 80.0–100.0)
Monocytes Absolute: 0.5 10*3/uL (ref 0.2–1.0)
Monocytes Relative: 8 %
NEUTROS PCT: 55 %
Neutro Abs: 3.2 10*3/uL (ref 1.4–6.5)
PLATELETS: 175 10*3/uL (ref 150–440)
RBC: 4.56 MIL/uL (ref 4.40–5.90)
RDW: 13.3 % (ref 11.5–14.5)
WBC: 5.9 10*3/uL (ref 3.8–10.6)

## 2017-04-24 LAB — COMPREHENSIVE METABOLIC PANEL
ALT: 26 U/L (ref 17–63)
AST: 25 U/L (ref 15–41)
Albumin: 3.9 g/dL (ref 3.5–5.0)
Alkaline Phosphatase: 74 U/L (ref 38–126)
Anion gap: 9 (ref 5–15)
BILIRUBIN TOTAL: 0.8 mg/dL (ref 0.3–1.2)
BUN: 12 mg/dL (ref 6–20)
CALCIUM: 9.1 mg/dL (ref 8.9–10.3)
CHLORIDE: 103 mmol/L (ref 101–111)
CO2: 30 mmol/L (ref 22–32)
CREATININE: 0.91 mg/dL (ref 0.61–1.24)
Glucose, Bld: 111 mg/dL — ABNORMAL HIGH (ref 65–99)
Potassium: 3.6 mmol/L (ref 3.5–5.1)
Sodium: 142 mmol/L (ref 135–145)
TOTAL PROTEIN: 6.8 g/dL (ref 6.5–8.1)

## 2017-04-24 LAB — PSA: Prostatic Specific Antigen: 0.64 ng/mL (ref 0.00–4.00)

## 2017-04-24 MED ORDER — DENOSUMAB 120 MG/1.7ML ~~LOC~~ SOLN
120.0000 mg | Freq: Once | SUBCUTANEOUS | Status: AC
  Administered 2017-04-24: 120 mg via SUBCUTANEOUS

## 2017-05-22 ENCOUNTER — Inpatient Hospital Stay: Payer: PPO | Attending: Oncology

## 2017-05-22 ENCOUNTER — Inpatient Hospital Stay: Payer: PPO

## 2017-05-22 VITALS — BP 169/80 | HR 74 | Temp 97.1°F | Resp 20

## 2017-05-22 DIAGNOSIS — C7951 Secondary malignant neoplasm of bone: Secondary | ICD-10-CM | POA: Insufficient documentation

## 2017-05-22 DIAGNOSIS — C61 Malignant neoplasm of prostate: Secondary | ICD-10-CM | POA: Insufficient documentation

## 2017-05-22 LAB — COMPREHENSIVE METABOLIC PANEL
ALT: 42 U/L (ref 17–63)
AST: 32 U/L (ref 15–41)
Albumin: 4 g/dL (ref 3.5–5.0)
Alkaline Phosphatase: 63 U/L (ref 38–126)
Anion gap: 7 (ref 5–15)
BILIRUBIN TOTAL: 0.9 mg/dL (ref 0.3–1.2)
BUN: 15 mg/dL (ref 6–20)
CHLORIDE: 107 mmol/L (ref 101–111)
CO2: 28 mmol/L (ref 22–32)
Calcium: 9 mg/dL (ref 8.9–10.3)
Creatinine, Ser: 0.88 mg/dL (ref 0.61–1.24)
Glucose, Bld: 104 mg/dL — ABNORMAL HIGH (ref 65–99)
POTASSIUM: 3.6 mmol/L (ref 3.5–5.1)
Sodium: 142 mmol/L (ref 135–145)
TOTAL PROTEIN: 7.2 g/dL (ref 6.5–8.1)

## 2017-05-22 LAB — CBC WITH DIFFERENTIAL/PLATELET
BASOS ABS: 0 10*3/uL (ref 0–0.1)
Basophils Relative: 1 %
EOS ABS: 0.2 10*3/uL (ref 0–0.7)
EOS PCT: 4 %
HCT: 40.7 % (ref 40.0–52.0)
HEMOGLOBIN: 14.4 g/dL (ref 13.0–18.0)
LYMPHS ABS: 2.1 10*3/uL (ref 1.0–3.6)
LYMPHS PCT: 44 %
MCH: 31.6 pg (ref 26.0–34.0)
MCHC: 35.4 g/dL (ref 32.0–36.0)
MCV: 89.3 fL (ref 80.0–100.0)
Monocytes Absolute: 0.4 10*3/uL (ref 0.2–1.0)
Monocytes Relative: 8 %
NEUTROS PCT: 43 %
Neutro Abs: 2 10*3/uL (ref 1.4–6.5)
PLATELETS: 180 10*3/uL (ref 150–440)
RBC: 4.55 MIL/uL (ref 4.40–5.90)
RDW: 13.2 % (ref 11.5–14.5)
WBC: 4.7 10*3/uL (ref 3.8–10.6)

## 2017-05-22 LAB — PSA: PROSTATIC SPECIFIC ANTIGEN: 0.43 ng/mL (ref 0.00–4.00)

## 2017-05-22 MED ORDER — DENOSUMAB 120 MG/1.7ML ~~LOC~~ SOLN
120.0000 mg | Freq: Once | SUBCUTANEOUS | Status: AC
  Administered 2017-05-22: 120 mg via SUBCUTANEOUS

## 2017-05-25 MED FILL — ZYTIGA 250 MG TABLET: 250 | 30 days supply | Qty: 120 | Fill #2

## 2017-05-25 MED FILL — predniSONE 5 MG TABS: 5 | 30 days supply | Qty: 30 | Fill #2

## 2017-06-03 ENCOUNTER — Telehealth: Payer: Self-pay | Admitting: Pharmacist

## 2017-06-03 DIAGNOSIS — H2513 Age-related nuclear cataract, bilateral: Secondary | ICD-10-CM | POA: Diagnosis not present

## 2017-06-03 NOTE — Telephone Encounter (Signed)
Oral Chemotherapy Pharmacist Encounter  Follow-Up Form  Called patient today to follow up regarding patient's oral chemotherapy medication: Zytiga (abiraterone)  Original Start date of oral chemotherapy: 09/2016  Pt reports 0 tablets/doses of Zytiga missed in the last month.   Pt reports the following side effects: N/A  Recent labs reviewed: PSA from 05-22-17  New medications?: none reported  Other Issues: N/A  Patient knows to call the office with questions or concerns. Oral Oncology Clinic will continue to follow.  Darl Pikes, PharmD, BCPS Hematology/Oncology Clinical Pharmacist [facility] [phone]  06/03/2017 4:28 PM

## 2017-06-14 ENCOUNTER — Other Ambulatory Visit: Payer: Self-pay | Admitting: Family Medicine

## 2017-06-14 DIAGNOSIS — K760 Fatty (change of) liver, not elsewhere classified: Secondary | ICD-10-CM

## 2017-06-14 DIAGNOSIS — E785 Hyperlipidemia, unspecified: Secondary | ICD-10-CM

## 2017-06-14 DIAGNOSIS — C61 Malignant neoplasm of prostate: Secondary | ICD-10-CM

## 2017-06-14 NOTE — Progress Notes (Signed)
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  INTERVAL HISTORY: Patient returns to clinic today for lab work, further evaluation and continuation of Xgeva. He continues to tolerate Zytiga well without significant side effects. He currently feels well and is asymptomatic. He has occasional pain in the morning. He has no neurologic complaints. He denies any recent fevers. He has no chest pain or shortness of breath.  He denies any nausea, vomiting, constipation, or diarrhea. He has no urinary complaints. Patient feels at his baseline and offers no further specific complaints today.  REVIEW OF SYSTEMS:   Review of Systems  Constitutional: Negative.  Negative for fever, malaise/fatigue and weight loss.  Respiratory: Negative.  Negative for cough and shortness of breath.   Cardiovascular: Negative.  Negative for chest pain and leg swelling.  Gastrointestinal: Negative.  Negative for abdominal pain.  Genitourinary: Negative.  Negative for frequency and urgency.  Musculoskeletal: Positive for back pain and joint pain.  Skin: Negative.  Negative for rash.  Neurological: Negative.  Negative for sensory change and weakness.  Psychiatric/Behavioral: Negative.  The patient is not nervous/anxious.     As per HPI. Otherwise, a complete review of systems is negative.  PAST MEDICAL HISTORY: Past Medical History:  Diagnosis Date  . 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 CAD (coronary artery disease), native coronary artery 2014   By CT lung (04/2013)   . Cataract   . HLD  (hyperlipidemia)   . HTN (hypertension)   . Hyperglycemia   . NAFLD (nonalcoholic fatty liver disease) 08/2011   by abd [place] with increased LFTs  . OSA (obstructive sleep apnea)    did not tolerate CPAP  . Other benign neoplasm of connective and other soft tissue of unspecified site    Neurofibroma of lateral periorbital area  . Peripheral neuropathy   . Personal history of colonic adenomas 08/05/2012  . Pneumonia 01/2013   CAP LUL  . Restless legs syndrome (RLS)   . Sleep apnea    no cpap    PAST SURGICAL HISTORY: Past Surgical History:  Procedure Laterality Date  . COLONOSCOPY  07/2012   5 adenomas (tubular, TV, serrated), rec rpt 5 months [name])  . COLONOSCOPY  02/2013   residual adenomas, cecal AVM, rec rpt 1 yr [name])  . COLONOSCOPY  03/2014   no residual polyp, cecal AVM, rpt 3-4 yrs [name])  . ORIF ANKLE FRACTURE  11/29/00   Dr. Tamala Julian  . SKIN CANCER EXCISION  1997   left anterior neck    FAMILY HISTORY: Family History  Problem Relation Age of Onset  . Cancer Father        liver  . Alcohol abuse Father   . Liver cancer Father   . Cancer Mother        breast  . Breast cancer Mother   . Hypertension Brother        Half-brother  . Coronary artery disease Paternal Aunt        CABG  . Cancer Other        GM; Aunt - breast  . Colon cancer Neg Hx   . Rectal cancer Neg Hx   .  Stomach cancer Neg Hx   . Esophageal cancer Neg Hx     ADVANCED DIRECTIVES (Y/N):  N  HEALTH MAINTENANCE: Social History   Tobacco Use  . Smoking status: Former Smoker    Types: Cigarettes, Cigars    Last attempt to quit: 04/28/2002    Years since quitting: 15.1  . Smokeless tobacco: Never Used  . Tobacco comment: rare cigar  Substance Use Topics  . Alcohol use: Yes    Alcohol/week: 2.4 oz    Types: 2 Standard drinks or equivalent, 2 Shots of liquor per week    Comment: Weekends  . Drug use: No     Colonoscopy:  PAP:  Bone density:  Lipid panel:  No Known  Allergies  Current Outpatient Medications  Medication Sig Dispense Refill  . abiraterone acetate (ZYTIGA) 250 MG tablet Take 4 tablets (1,000 mg total) daily by mouth. Take on an empty stomach 1 hour before or 2 hours after a meal 120 tablet 2  . amLODipine (NORVASC) 5 MG tablet Take 1 tablet (5 mg total) by mouth daily. 90 tablet 3  . CALCIUM PO Take 1,200 mg by mouth daily.    . cholecalciferol (VITAMIN D) 1000 UNITS tablet Take 2,000 Units by mouth daily.     Marland Kitchen gabapentin (NEURONTIN) 300 MG capsule Take 1 capsule (300 mg total) by mouth at bedtime. 90 capsule 3  . HYDROcodone-acetaminophen (NORCO/VICODIN) 5-325 MG tablet Take 1 tablet by mouth every 4 (four) hours as needed for moderate pain. 60 tablet 0  . lisinopril (PRINIVIL,ZESTRIL) 5 MG tablet Take 1 tablet (5 mg total) by mouth daily. 90 tablet 3  . Multiple Vitamins-Minerals (MULTIVITAMIN PO) Take 1 tablet by mouth daily.    . predniSONE (DELTASONE) 5 MG tablet Take 1 tablet (5 mg total) daily with breakfast by mouth. 30 tablet 5  . temazepam (RESTORIL) 30 MG capsule Take 1 capsule (30 mg total) by mouth at bedtime as needed for sleep. 30 capsule 5  . traMADol (ULTRAM) 50 MG tablet Take 1 tablet (50 mg total) by mouth 2 (two) times daily as needed. 30 tablet 5   No current facility-administered medications for this visit.     OBJECTIVE: Vitals:   06/19/17 0845  BP: (!) 148/85  Pulse: 78  Resp: 16  Temp: 97.9 F (36.6 C)     Body mass index is 33.09 kg/m.    ECOG FS:0 - Asymptomatic  General: Well-developed, well-nourished, no acute distress. Eyes: Pink conjunctiva, anicteric sclera. Lungs: Clear to auscultation bilaterally. Heart: Regular rate and rhythm. No rubs, murmurs, or gallops. Abdomen: Soft, nontender, nondistended. No organomegaly noted, normoactive bowel sounds. Musculoskeletal: No edema, cyanosis, or clubbing. Neuro: Alert, answering all questions appropriately. Cranial nerves grossly intact. Skin: No rashes  or petechiae noted. Psych: Normal affect.   LAB RESULTS:  Lab Results  Component Value Date   NA 142 06/16/2017   K 3.7 06/16/2017   CL 106 06/16/2017   CO2 30 06/16/2017   GLUCOSE 105 (H) 06/16/2017   BUN 13 06/16/2017   CREATININE 0.89 06/16/2017   CALCIUM 9.6 06/16/2017   PROT 6.9 06/16/2017   ALBUMIN 4.0 06/16/2017   AST 22 06/16/2017   ALT 27 06/16/2017   ALKPHOS 54 06/16/2017   BILITOT 0.6 06/16/2017   GFRNONAA >60 05/22/2017   GFRAA >60 05/22/2017    Lab Results  Component Value Date   WBC 5.3 06/19/2017   NEUTROABS 2.5 06/19/2017   HGB 14.3 06/19/2017   HCT 41.0 06/19/2017  MCV 88.9 06/19/2017   PLT 194 06/19/2017     STUDIES: No results found.  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   PLAN:    1. 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 Prostate biopsy results revealed Gleason's 8 (4+4) adenocarcinoma.  His most recent bone scan on January 15, 2017 revealed improvement of his bony metastasis.  His PSA continues to trend down, today's result is pending.  Continue Zytiga and prednisone as prescribed until intolerable side effects or progression of disease. Patient's calcium levels have improved, therefore will proceed with Xgeva today. He has been instructed to continue his oral calcium supplementation. Return to clinic every 4 weeks for laboratory work and [place] and then in 3 months for further evaluation. 2. Hypocalcemia: Improved.  Continue calcium and vitamin D supplementation. Proceed with Xgeva as above. Monitor. 3. Pain: Patient was given a refill of his Vicodin today.  Approximately 30 minutes was spent in discussion of which greater than 50% was consultation.   Patient expressed understanding and was in agreement with this  plan. He also understands that He can call clinic at any time with any questions, concerns, or complaints.   Cancer Staging Primary malignant 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 [facility]) Staging form: Prostate, AJCC 8th Edition - Clinical stage from 09/26/2016: Stage IVB (cTX, cN1, cM1c, PSA: 193) - Signed by Lloyd Huger, MD on 09/26/2016   Lloyd Huger, MD   06/19/2017 8:59 AM

## 2017-06-16 ENCOUNTER — Ambulatory Visit (INDEPENDENT_AMBULATORY_CARE_PROVIDER_SITE_OTHER): Payer: PPO

## 2017-06-16 VITALS — BP 140/82 | HR 74 | Temp 98.5°F | Ht 65.5 in | Wt 201.8 lb

## 2017-06-16 DIAGNOSIS — E785 Hyperlipidemia, unspecified: Secondary | ICD-10-CM

## 2017-06-16 DIAGNOSIS — I10 Essential (primary) hypertension: Secondary | ICD-10-CM | POA: Diagnosis not present

## 2017-06-16 DIAGNOSIS — Z0000 Encounter for general adult medical examination without abnormal findings: Secondary | ICD-10-CM

## 2017-06-16 LAB — LIPID PANEL
CHOLESTEROL: 162 mg/dL (ref 0–200)
HDL: 24 mg/dL — ABNORMAL LOW (ref >39.00)
NonHDL: 137.61
TRIGLYCERIDES: 351 mg/dL — AB (ref 0.0–149.0)
Total CHOL/HDL Ratio: 7
VLDL: 70.2 mg/dL — ABNORMAL HIGH (ref 0.0–40.0)

## 2017-06-16 LAB — COMPREHENSIVE METABOLIC PANEL
ALBUMIN: 4 g/dL (ref 3.5–5.2)
ALK PHOS: 54 U/L (ref 39–117)
ALT: 27 U/L (ref 0–53)
AST: 22 U/L (ref 0–37)
BILIRUBIN TOTAL: 0.6 mg/dL (ref 0.2–1.2)
BUN: 13 mg/dL (ref 6–23)
CALCIUM: 9.6 mg/dL (ref 8.4–10.5)
CHLORIDE: 106 meq/L (ref 96–112)
CO2: 30 mEq/L (ref 19–32)
CREATININE: 0.89 mg/dL (ref 0.40–1.50)
GFR: 88.43 mL/min (ref >60.00)
Glucose, Bld: 105 mg/dL — ABNORMAL HIGH (ref 70–99)
Potassium: 3.7 mEq/L (ref 3.5–5.1)
Sodium: 142 mEq/L (ref 135–145)
TOTAL PROTEIN: 6.9 g/dL (ref 6.0–8.3)

## 2017-06-16 LAB — LDL CHOLESTEROL, DIRECT: Direct LDL: 81 mg/dL

## 2017-06-16 NOTE — Patient Instructions (Signed)
Justin Ali , Thank you for taking time to come for your Medicare Wellness Visit. I appreciate your ongoing commitment to your health goals. Please review the following plan we discussed and let me know if I can assist you in the future.   These are the goals we discussed: Goals    . Increase physical activity     Starting 06/16/2017, I will continue to exercise for 90 minutes 3 days per week.        This is a list of the screening recommended for you and due dates:  Health Maintenance  Topic Date Due  . Colon Cancer Screening  06/16/2018*  . DTaP/Tdap/Td vaccine (1 - Tdap) 04/28/2020*  . Tetanus Vaccine  04/28/2020  . Flu Shot  Completed  . Pneumonia vaccines  Completed  *Topic was postponed. The date shown is not the original due date.   Preventive Care for Adults  A healthy lifestyle and preventive care can promote health and wellness. Preventive health guidelines for adults include the following key practices.  . A routine yearly physical is a good way to check with your health care provider about your health and preventive screening. It is a chance to share any concerns and updates on your health and to receive a thorough exam.  . Visit your dentist for a routine exam and preventive care every 6 months. Brush your teeth twice a day and floss once a day. Good oral hygiene prevents tooth decay and gum disease.  . The frequency of eye exams is based on your age, health, family medical history, use  of contact lenses, and other factors. Follow your health care provider's recommendations for frequency of eye exams.  . Eat a healthy diet. Foods like vegetables, fruits, whole grains, low-fat dairy products, and lean protein foods contain the nutrients you need without too many calories. Decrease your intake of foods high in solid fats, added sugars, and salt. Eat the right amount of calories for you. Get information about a proper diet from your health care provider, if necessary.  .  Regular physical exercise is one of the most important things you can do for your health. Most adults should get at least 150 minutes of moderate-intensity exercise (any activity that increases your heart rate and causes you to sweat) each week. In addition, most adults need muscle-strengthening exercises on 2 or more days a week.  Silver Sneakers may be a benefit available to you. To determine eligibility, you may visit the website: www.silversneakers.com or contact program at 873-360-0073 Mon-Fri between 8AM-8PM.   . Maintain a healthy weight. The body mass index (BMI) is a screening tool to identify possible weight problems. It provides an estimate of body fat based on height and weight. Your health care provider can find your BMI and can help you achieve or maintain a healthy weight.   For adults 20 years and older: ? A BMI below 18.5 is considered underweight. ? A BMI of 18.5 to 24.9 is normal. ? A BMI of 25 to 29.9 is considered overweight. ? A BMI of 30 and above is considered obese.   . Maintain normal blood lipids and cholesterol levels by exercising and minimizing your intake of saturated fat. Eat a balanced diet with plenty of fruit and vegetables. Blood tests for lipids and cholesterol should begin at age 10 and be repeated every 5 years. If your lipid or cholesterol levels are high, you are over 50, or you are at high risk for heart disease,  you may need your cholesterol levels checked more frequently. Ongoing high lipid and cholesterol levels should be treated with medicines if diet and exercise are not working.  . If you smoke, find out from your health care provider how to quit. If you do not use tobacco, please do not start.  . If you choose to drink alcohol, please do not consume more than 2 drinks per day. One drink is considered to be 12 ounces (355 mL) of beer, 5 ounces (148 mL) of wine, or 1.5 ounces (44 mL) of liquor.  . If you are 67-10 years old, ask your health care  provider if you should take aspirin to prevent strokes.  . Use sunscreen. Apply sunscreen liberally and repeatedly throughout the day. You should seek shade when your shadow is shorter than you. Protect yourself by wearing long sleeves, pants, a wide-brimmed hat, and sunglasses year round, whenever you are outdoors.  . Once a month, do a whole body skin exam, using a mirror to look at the skin on your back. Tell your health care provider of new moles, moles that have irregular borders, moles that are larger than a pencil eraser, or moles that have changed in shape or color.

## 2017-06-16 NOTE — Progress Notes (Signed)
Pre visit review using our clinic review tool, if applicable. No additional management support is needed unless otherwise documented below in the visit note. 

## 2017-06-16 NOTE — Progress Notes (Signed)
PCP notes:   Health maintenance:  Colonoscopy - pt declined due to current health status  Abnormal screenings:   Fall risk - hx of single fall Fall Risk  06/16/2017 06/13/2016 06/12/2015 06/07/2014 06/02/2013  Falls in the past year? Yes No No No No  Comment tripped over rocks and injured left knee - - - -  Number falls in past yr: 1 - - - -  Injury with Fall? Yes - - - -   Hearing - failed  Hearing Screening   125Hz  250Hz  500Hz  1000Hz  2000Hz  3000Hz  4000Hz  6000Hz  8000Hz   Right ear:   0 0 40  40    Left ear:   40 40 40  40     Patient concerns:   Intermittent profuse sweating - pt states he has intermittent sweating that leaves the neck of his shirts "wet with sweat".   Bump above left eyebrow - PCP please assess at next appt  Nurse concerns:  None  Next PCP appt:   06/18/17 @ 1030

## 2017-06-16 NOTE — Progress Notes (Signed)
Subjective:   Justin Ali is a 77 y.o. male who presents for Medicare Annual/Subsequent preventive examination.  Review of Systems:  N/A Cardiac Risk Factors include: advanced age (>51men, >6 women);male gender;obesity (BMI >30kg/m2);dyslipidemia;hypertension;Other (see comment)     Objective:    Vitals: BP 140/82 (BP Location: Right Arm, Patient Position: Sitting, Cuff Size: Normal)   Pulse 74   Temp 98.5 F (36.9 C) (Oral)   Ht 5' 5.5" (1.664 m)   Wt 201 lb 12 oz (91.5 kg)   SpO2 95%   BMI 33.06 kg/m   Body mass index is 33.06 kg/m.  Advanced Directives 06/16/2017 09/26/2016 06/13/2016 03/07/2014  Does Patient Have a Medical Advance Directive? Yes Yes Yes Yes  Type of Paramedic of [place];Living will Living will;Healthcare Power of [place];Living will Living will  Copy of [place] in Chart? No - copy requested No - copy requested Yes -    Tobacco Social History   Tobacco Use  Smoking Status Former Smoker  . Types: Cigarettes, Cigars  . Last attempt to quit: 04/28/2002  . Years since quitting: 15.1  Smokeless Tobacco Never Used  Tobacco Comment   rare cigar     Counseling given: No Comment: rare cigar   Clinical Intake:  Pre-visit preparation completed: Yes  Pain Score: 1      Nutritional Status: BMI 25 -29 Overweight Nutritional Risks: Other (Comment) Diabetes: No  How often do you need to have someone help you when you read instructions, pamphlets, or other written materials from your doctor or pharmacy?: 1 - Never What is the last grade level you completed in school?: 12th grade  Interpreter Needed?: No  Comments: pt lives with spouse Information entered by :: LPinson, LPN  Past Medical History:  Diagnosis Date  . 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 CAD (coronary artery disease), native coronary artery 2014   By CT lung  (04/2013)   . Cataract   . HLD (hyperlipidemia)   . HTN (hypertension)   . Hyperglycemia   . NAFLD (nonalcoholic fatty liver disease) 08/2011   by abd [place] with increased LFTs  . OSA (obstructive sleep apnea)    did not tolerate CPAP  . Other benign neoplasm of connective and other soft tissue of unspecified site    Neurofibroma of lateral periorbital area  . Peripheral neuropathy   . Personal history of colonic adenomas 08/05/2012  . Pneumonia 01/2013   CAP LUL  . Restless legs syndrome (RLS)   . Sleep apnea    no cpap   Past Surgical History:  Procedure Laterality Date  . COLONOSCOPY  07/2012   5 adenomas (tubular, TV, serrated), rec rpt 5 months [name])  . COLONOSCOPY  02/2013   residual adenomas, cecal AVM, rec rpt 1 yr [name])  . COLONOSCOPY  03/2014   no residual polyp, cecal AVM, rpt 3-4 yrs [name])  . ORIF ANKLE FRACTURE  11/29/00   Dr. Tamala Julian  . SKIN CANCER EXCISION  1997   left anterior neck   Family History  Problem Relation Age of Onset  . Cancer Father        liver  . Alcohol abuse Father   . Liver cancer Father   . Cancer Mother        breast  . Breast cancer Mother   . Hypertension Brother        Half-brother  . Coronary  artery disease Paternal Aunt        CABG  . Cancer Other        GM; Aunt - breast  . Colon cancer Neg Hx   . Rectal cancer Neg Hx   . Stomach cancer Neg Hx   . Esophageal cancer Neg Hx    Social History   Socioeconomic History  . Marital status: Married    Spouse name: None  . Number of children: 2  . Years of education: None  . Highest education level: None  Social Needs  . Financial resource strain: None  . Food insecurity - worry: None  . Food insecurity - inability: None  . Transportation needs - medical: None  . Transportation needs - non-medical: None  Occupational History  . 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 Smoking status: Former Smoker    Types: Cigarettes, Cigars    Last  attempt to quit: 04/28/2002    Years since quitting: 15.1  . Smokeless tobacco: Never Used  . Tobacco comment: rare cigar  Substance and Sexual Activity  . Alcohol use: Yes    Alcohol/week: 2.4 oz    Types: 2 Standard drinks or equivalent, 2 Shots of liquor per week    Comment: Weekends  . Drug use: No  . Sexual activity: None  Other Topics Concern  . None  Social History Narrative   Caffeine: occasional coffee   Lives with wife, 1 cat   Occupation: truck driver Adult nurse)   Activity: goes to [employer] 3x/wk   Diet: rare water, lots of sweet tea, some fruits/vegetables    Outpatient Encounter Medications as of 06/16/2017  Medication Sig  . abiraterone acetate (ZYTIGA) 250 MG tablet Take 4 tablets (1,000 mg total) daily by mouth. Take on an empty stomach 1 hour before or 2 hours after a meal  . amLODipine (NORVASC) 5 MG tablet Take 1 tablet (5 mg total) by mouth daily.  Marland Kitchen CALCIUM PO Take 1,200 mg by mouth daily.  . cholecalciferol (VITAMIN D) 1000 UNITS tablet Take 2,000 Units by mouth daily.   Marland Kitchen gabapentin (NEURONTIN) 300 MG capsule Take 1 capsule (300 mg total) by mouth at bedtime.  Marland Kitchen HYDROcodone-acetaminophen (NORCO/VICODIN) 5-325 MG tablet Take 1 tablet by mouth every 4 (four) hours as needed for moderate pain.  Marland Kitchen lisinopril (PRINIVIL,ZESTRIL) 5 MG tablet Take 1 tablet (5 mg total) by mouth daily.  . Multiple Vitamins-Minerals (MULTIVITAMIN PO) Take 1 tablet by mouth daily.  . predniSONE (DELTASONE) 5 MG tablet Take 1 tablet (5 mg total) daily with breakfast by mouth.  . temazepam (RESTORIL) 30 MG capsule Take 1 capsule (30 mg total) by mouth at bedtime as needed for sleep.  . traMADol (ULTRAM) 50 MG tablet Take 1 tablet (50 mg total) by mouth 2 (two) times daily as needed.  . [DISCONTINUED] aspirin EC 81 MG tablet Take 1 tablet (81 mg total) by mouth daily.  . [DISCONTINUED] fish oil-omega-3 fatty acids 1000 MG capsule Take 2 g by mouth daily.   . [DISCONTINUED] Melatonin 10 MG  CAPS Take 1 capsule by mouth as needed.   No facility-administered encounter medications on file as of 06/16/2017.     Activities of Daily Living In your present state of health, do you have any difficulty performing the following activities: 06/16/2017  Hearing? N  Vision? N  Difficulty concentrating or making decisions? Y  Walking or climbing stairs? N  Dressing or bathing? N  Doing  errands, shopping? N  Preparing Food and eating ? N  Using the Toilet? N  In the past six months, have you accidently leaked urine? N  Do you have problems with loss of bowel control? N  Managing your Medications? N  Managing your Finances? N  Housekeeping or managing your Housekeeping? N  Some recent data might be hidden    Patient Care Team: Ria Bush, MD as PCP - General (Family Medicine) Birder Robson, MD as Referring Physician (Ophthalmology)   Assessment:   This is a routine wellness examination for Ameya.   Hearing Screening   125Hz  250Hz  500Hz  1000Hz  2000Hz  3000Hz  4000Hz  6000Hz  8000Hz   Right ear:   0 0 40  40    Left ear:   40 40 40  40    Vision Screening Comments: Vision exam on 06/09/17 with Dr. George Ina  Exercise Activities and Dietary recommendations Current Exercise Habits: Home exercise routine, Type of exercise: strength training/weights;stretching;treadmill, Time (Minutes): > 60, Frequency (Times/Week): 3, Weekly Exercise (Minutes/Week): 0, Intensity: Moderate, Exercise limited by: None identified  Goals    . Increase physical activity     Starting 06/16/2017, I will continue to exercise for 90 minutes 3 days per week.        Fall Risk Fall Risk  06/16/2017 06/13/2016 06/12/2015 06/07/2014 06/02/2013  Falls in the past year? Yes No No No No  Comment tripped over rocks and injured left knee - - - -  Number falls in past yr: 1 - - - -  Injury with Fall? Yes - - - -   Depression Screen PHQ 2/9 Scores 06/16/2017 06/13/2016 06/12/2015 06/07/2014  PHQ - 2 Score 0 0 0 0    PHQ- 9 Score 0 - - -    Cognitive Function MMSE - Mini Mental State Exam 06/16/2017 06/13/2016  Orientation to time 5 5  Orientation to Place 5 5  Registration 3 3  Attention/ Calculation 0 0  Recall 3 3  Language- name 2 objects 0 0  Language- repeat 1 1  Language- follow 3 step command 3 3  Language- read & follow direction 0 0  Write a sentence 0 0  Copy design 0 0  Total score 20 20       PLEASE NOTE: A Mini-Cog screen was completed. Maximum score is 20. A value of 0 denotes this part of [place] MMSE was not completed or the patient failed this part of the Mini-Cog screening.   Mini-Cog Screening Orientation to Time - Max 5 pts Orientation to Place - Max 5 pts Registration - Max 3 pts Recall - Max 3 pts Language Repeat - Max 1 pts Language Follow 3 Step Command - Max 3 pts   Immunization History  Administered Date(s) Administered  . Influenza Whole 01/24/2008, 01/08/2009, 02/08/2013  . Influenza, Seasonal, Injecte, Preservative Fre 01/04/2014  . Influenza,inj,Quad PF,6+ Mos 01/02/2015, 01/09/2016, 01/02/2017  . Pneumococcal Conjugate-13 06/02/2013  . Pneumococcal Polysaccharide-23 06/12/2010  . Td 08/27/2002, 04/28/2010    Screening Tests Health Maintenance  Topic Date Due  . COLONOSCOPY  06/16/2018 (Originally 03/22/2017)  . DTaP/Tdap/Td (1 - Tdap) 04/28/2020 (Originally 04/29/2010)  . TETANUS/TDAP  04/28/2020  . INFLUENZA VACCINE  Completed  . PNA vac Low Risk Adult  Completed     Plan:     I have personally reviewed, addressed, and noted the following in the patient's chart:  A. Medical and social history B. Use of alcohol, tobacco or illicit drugs  C. Current medications and supplements  D. Functional ability and status E.  Nutritional status F.  Physical activity G. Advance directives H. List of other physicians I.  Hospitalizations, surgeries, and ER visits in previous 12 months J.  [place] to include hearing, vision, cognitive,  depression L. Referrals and appointments - none  In addition, I have reviewed and discussed with patient certain preventive protocols, quality metrics, and best practice recommendations. A written personalized care plan for preventive services as well as general preventive health recommendations were provided to patient.  See attached scanned questionnaire for additional information.   Signed,   Lindell Noe, MHA, BS, LPN Health Coach

## 2017-06-18 ENCOUNTER — Encounter: Payer: Self-pay | Admitting: Family Medicine

## 2017-06-18 ENCOUNTER — Ambulatory Visit (INDEPENDENT_AMBULATORY_CARE_PROVIDER_SITE_OTHER): Payer: PPO | Admitting: Family Medicine

## 2017-06-18 ENCOUNTER — Other Ambulatory Visit: Payer: Self-pay | Admitting: *Deleted

## 2017-06-18 VITALS — BP 144/70 | HR 70 | Temp 98.1°F | Ht 66.0 in | Wt 200.0 lb

## 2017-06-18 DIAGNOSIS — E785 Hyperlipidemia, unspecified: Secondary | ICD-10-CM

## 2017-06-18 DIAGNOSIS — C61 Malignant neoplasm of prostate: Secondary | ICD-10-CM

## 2017-06-18 DIAGNOSIS — K760 Fatty (change of) liver, not elsewhere classified: Secondary | ICD-10-CM

## 2017-06-18 DIAGNOSIS — Z0000 Encounter for general adult medical examination without abnormal findings: Secondary | ICD-10-CM | POA: Diagnosis not present

## 2017-06-18 DIAGNOSIS — E669 Obesity, unspecified: Secondary | ICD-10-CM

## 2017-06-18 DIAGNOSIS — Z7189 Other specified counseling: Secondary | ICD-10-CM

## 2017-06-18 DIAGNOSIS — Z86010 Personal history of colonic polyps: Secondary | ICD-10-CM | POA: Diagnosis not present

## 2017-06-18 DIAGNOSIS — C7951 Secondary malignant neoplasm of bone: Secondary | ICD-10-CM

## 2017-06-18 DIAGNOSIS — I10 Essential (primary) hypertension: Secondary | ICD-10-CM | POA: Diagnosis not present

## 2017-06-18 DIAGNOSIS — G4700 Insomnia, unspecified: Secondary | ICD-10-CM

## 2017-06-18 MED ORDER — GABAPENTIN 300 MG PO CAPS: 300.0000 mg | ORAL_CAPSULE | Freq: Every day | ORAL | 3 refills | Status: DC

## 2017-06-18 MED ORDER — TEMAZEPAM 30 MG PO CAPS: 30.0000 mg | ORAL_CAPSULE | Freq: Every evening | ORAL | 5 refills | Status: DC | PRN

## 2017-06-18 MED ORDER — AMLODIPINE BESYLATE 5 MG PO TABS: 5.0000 mg | ORAL_TABLET | Freq: Every day | ORAL | 3 refills | Status: DC

## 2017-06-18 MED ORDER — LISINOPRIL 5 MG PO TABS: 5.0000 mg | ORAL_TABLET | Freq: Every day | ORAL | 3 refills | Status: DC

## 2017-06-18 MED ORDER — TRAMADOL HCL 50 MG PO TABS: 50.0000 mg | ORAL_TABLET | Freq: Two times a day (BID) | ORAL | 5 refills | Status: DC | PRN

## 2017-06-18 NOTE — Assessment & Plan Note (Signed)
Reviewed healthy diet and lifestyle changes.

## 2017-06-18 NOTE — Assessment & Plan Note (Signed)
Advanced directives - Received advanced directives, scanned 05/2015. No prolonged life support if terminal condition. No HCPOA form. Wife would be HCPOA. Wants to be [facility] in [facility].

## 2017-06-18 NOTE — Assessment & Plan Note (Signed)
Chronic, stable. Continue current regimen. 

## 2017-06-18 NOTE — Assessment & Plan Note (Addendum)
Appreciate onc care of patient.  PRN tramadol for pain.

## 2017-06-18 NOTE — Assessment & Plan Note (Signed)
Chronic, stable on temazepam 30mg  at night time.

## 2017-06-18 NOTE — Assessment & Plan Note (Signed)
Defer colonoscopy given recent dx metastatic prostate cancer.

## 2017-06-18 NOTE — Patient Instructions (Addendum)
You are doing well today.  Continue follow up with Dr Grayland Ormond.  Medicines refilled today.  Return as needed or in 1 year for medicare wellness and physical.  Health Maintenance, Male A healthy lifestyle and preventive care is important for your health and wellness. Ask your health care provider about what schedule of regular examinations is right for you. What should I know about weight and diet? Eat a Healthy Diet  Eat plenty of vegetables, fruits, whole grains, low-fat dairy products, and lean protein.  Do not eat a lot of foods high in solid fats, added sugars, or salt.  Maintain a Healthy Weight Regular exercise can help you achieve or maintain a healthy weight. You should:  Do at least 150 minutes of exercise each week. The exercise should increase your heart rate and make you sweat (moderate-intensity exercise).  Do strength-training exercises at least twice a week.  Watch Your Levels of Cholesterol and Blood Lipids  Have your blood tested for lipids and cholesterol every 5 years starting at 76 years of age. If you are at high risk for heart disease, you should start having your blood tested when you are 76 years old. You may need to have your cholesterol levels checked more often if: ? Your lipid or cholesterol levels are high. ? You are older than 76 years of age. ? You are at high risk for heart disease.  What should I know about cancer screening? Many types of cancers can be detected early and may often be prevented. Lung Cancer  You should be screened every year for lung cancer if: ? You are a current smoker who has smoked for at least 30 years. ? You are a former smoker who has quit within the past 15 years.  Talk to your health care provider about your screening options, when you should start screening, and how often you should be screened.  Colorectal Cancer  Routine colorectal cancer screening usually begins at 76 years of age and should be repeated every 5-10  years until you are 76 years old. You may need to be screened more often if early forms of precancerous polyps or small growths are found. Your health care provider may recommend screening at an earlier age if you have risk factors for colon cancer.  Your health care provider may recommend using home test kits to check for hidden blood in the stool.  A small camera at the end of a tube can be used to examine your colon (sigmoidoscopy or colonoscopy). This checks for the earliest forms of colorectal cancer.  Prostate and Testicular Cancer  Depending on your age and overall health, your health care provider may do certain tests to screen for prostate and testicular cancer.  Talk to your health care provider about any symptoms or concerns you have about testicular or prostate cancer.  Skin Cancer  Check your skin from head to toe regularly.  Tell your health care provider about any new moles or changes in moles, especially if: ? There is a change in a mole's size, shape, or color. ? You have a mole that is larger than a pencil eraser.  Always use sunscreen. Apply sunscreen liberally and repeat throughout the day.  Protect yourself by wearing long sleeves, pants, a wide-brimmed hat, and sunglasses when outside.  What should I know about heart disease, diabetes, and high blood pressure?  If you are 63-27 years of age, have your blood pressure checked every 3-5 years. If you are 40 years  of age or older, have your blood pressure checked every year. You should have your blood pressure measured twice-once when you are at a hospital or clinic, and once when you are not at a hospital or clinic. Record the average of the two measurements. To check your blood pressure when you are not at a hospital or clinic, you can use: ? An automated blood pressure machine at a pharmacy. ? A home blood pressure monitor.  Talk to your health care provider about your target blood pressure.  If you are between  20-21 years old, ask your health care provider if you should take aspirin to prevent heart disease.  Have regular diabetes screenings by checking your fasting blood sugar level. ? If you are at a normal weight and have a low risk for diabetes, have this test once every three years after the age of 90. ? If you are overweight and have a high risk for diabetes, consider being tested at a younger age or more often.  A one-time screening for abdominal aortic aneurysm (AAA) by ultrasound is recommended for men aged 60-75 years who are current or former smokers. What should I know about preventing infection? Hepatitis B If you have a higher risk for hepatitis B, you should be screened for this virus. Talk with your health care provider to find out if you are at risk for hepatitis B infection. Hepatitis C Blood testing is recommended for:  Everyone born from [birth year] through 1964.  Anyone with known risk factors for hepatitis C.  Sexually Transmitted Diseases (STDs)  You should be screened each year for STDs including gonorrhea and chlamydia if: ? You are sexually active and are younger than 76 years of age. ? You are older than 76 years of age and your health care provider tells you that you are at risk for this type of infection. ? Your sexual activity has changed since you were last screened and you are at an increased risk for chlamydia or gonorrhea. Ask your health care provider if you are at risk.  Talk with your health care provider about whether you are at high risk of being infected with HIV. Your health care provider may recommend a prescription medicine to help prevent HIV infection.  What else can I do?  Schedule regular health, dental, and eye exams.  Stay current with your vaccines (immunizations).  Do not use any tobacco products, such as cigarettes, chewing tobacco, and e-cigarettes. If you need help quitting, ask your health care provider.  Limit alcohol intake to no more than  2 drinks per day. One drink equals 12 ounces of beer, 5 ounces of wine, or 1 ounces of hard liquor.  Do not use street drugs.  Do not share needles.  Ask your health care provider for help if you need support or information about quitting drugs.  Tell your health care provider if you often feel depressed.  Tell your health care provider if you have ever been abused or do not feel safe at home. This information is not intended to replace advice given to you by your health care provider. Make sure you discuss any questions you have with your health care provider. Document Released: 10/11/2007 Document Revised: 12/12/2015 Document Reviewed: 01/16/2015 Elsevier Interactive Patient Education  [employer].

## 2017-06-18 NOTE — Assessment & Plan Note (Signed)
Preventative protocols reviewed and updated unless pt declined. Discussed healthy diet and lifestyle.  

## 2017-06-18 NOTE — Progress Notes (Signed)
BP (!) 144/70 (BP Location: Left Arm, Patient Position: Sitting, Cuff Size: Large)   Pulse 70   Temp 98.1 F (36.7 C) (Oral)   Ht 5\' 6"  (1.676 m)   Wt 200 lb (90.7 kg)   SpO2 96%   BMI 32.28 kg/m    CC: CPE Subjective:    Patient ID: Justin Ali, male    DOB: December 29, 1940, 77 y.o.   MRN: [id number]  HPI: Justin Ali is a 77 y.o. male presenting on 06/18/2017 for Annual Exam (Pt 2.)   Saw Katha Cabal this week for medicare wellness visit. Note reviewed.  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 On xgeva and zytiga/prednisone.   Preventative: COLONOSCOPY Date: 03/2014 no residual polyp, cecal AVM, rpt 3-4 yrs [name]). Pt has decided to postpone.  Prostate cancer - see above Lung cancer screening - not eligible.  Flu shot yearly Td - 2012  Pneumovax 2012, prevnar 2015 zostavax - insurance won't cover so not interested. Doesn't think has had chicken pox.  shingrix - discussed - not interested Advanced directives - Received advanced directives, scanned 05/2015. No prolonged life support if terminal condition. No HCPOA form. Wife would be HCPOA. Wants to [facility] in [facility]  Seat belt use discussed Sunscreen use discussed. No changing moles.  Ex smoker - quit 2004. Mainly cigars Alcohol - stopped 03/2017  Caffeine: occasional coffee  Lives with wife, 1 cat  Occupation: truck driver Adult nurse)  Activity: goes to [employer] 3x/wk  Diet: rare water, lots of sweet tea, some fruits/vegetables   Relevant past medical, surgical, family and social history reviewed and updated as indicated. Interim medical history since our last visit reviewed. Allergies and medications reviewed and updated. Outpatient Medications Prior to Visit  Medication Sig Dispense Refill  . abiraterone acetate (ZYTIGA) 250 MG tablet Take 4 tablets (1,000 mg total) daily by mouth. Take on an empty stomach 1 hour before or 2 hours after a  meal 120 tablet 2  . CALCIUM PO Take 1,200 mg by mouth daily.    . cholecalciferol (VITAMIN D) 1000 UNITS tablet Take 2,000 Units by mouth daily.     Marland Kitchen HYDROcodone-acetaminophen (NORCO/VICODIN) 5-325 MG tablet Take 1 tablet by mouth every 4 (four) hours as needed for moderate pain. 60 tablet 0  . Multiple Vitamins-Minerals (MULTIVITAMIN PO) Take 1 tablet by mouth daily.    . predniSONE (DELTASONE) 5 MG tablet Take 1 tablet (5 mg total) daily with breakfast by mouth. 30 tablet 5  . amLODipine (NORVASC) 5 MG tablet Take 1 tablet (5 mg total) by mouth daily. 90 tablet 3  . gabapentin (NEURONTIN) 300 MG capsule Take 1 capsule (300 mg total) by mouth at bedtime. 90 capsule 3  . lisinopril (PRINIVIL,ZESTRIL) 5 MG tablet Take 1 tablet (5 mg total) by mouth daily. 90 tablet 3  . temazepam (RESTORIL) 30 MG capsule Take 1 capsule (30 mg total) by mouth at bedtime as needed for sleep. 30 capsule 3  . traMADol (ULTRAM) 50 MG tablet Take 1 tablet (50 mg total) by mouth 2 (two) times daily as needed. 30 tablet 3   No facility-administered medications prior to visit.      Per HPI unless specifically indicated in ROS section below Review of Systems  Constitutional: Positive for appetite change (increased - attributed to prednisone). Negative for activity change, chills, fatigue, fever and unexpected weight change.       Some night and day  sweats  HENT: Negative for hearing loss.   Eyes: Negative for visual disturbance.  Respiratory: Negative for cough, chest tightness, shortness of breath and wheezing.   Cardiovascular: Negative for chest pain, palpitations and leg swelling.  Gastrointestinal: Negative for abdominal distention, abdominal pain, blood in stool, constipation, diarrhea, nausea and vomiting.  Genitourinary: Negative for difficulty urinating and hematuria.  Musculoskeletal: Negative for arthralgias, myalgias and neck pain.  Skin: Negative for rash.  Neurological: Negative for dizziness,  seizures, syncope and headaches.  Hematological: Negative for adenopathy. Bruises/bleeds easily.  Psychiatric/Behavioral: Negative for dysphoric mood. The patient is not nervous/anxious.        Objective:    BP (!) 144/70 (BP Location: Left Arm, Patient Position: Sitting, Cuff Size: Large)   Pulse 70   Temp 98.1 F (36.7 C) (Oral)   Ht 5\' 6"  (1.676 m)   Wt 200 lb (90.7 kg)   SpO2 96%   BMI 32.28 kg/m   Wt Readings from Last 3 Encounters:  06/18/17 200 lb (90.7 kg)  06/16/17 201 lb 12 oz (91.5 kg)  03/27/17 199 lb 15.3 oz (90.7 kg)    Physical Exam  Constitutional: He is oriented to person, place, and time. He appears well-developed and well-nourished. No distress.  HENT:  Head: Normocephalic and atraumatic.  Right Ear: Hearing, tympanic membrane, external ear and ear canal normal.  Left Ear: Hearing, tympanic membrane, external ear and ear canal normal.  Nose: Nose normal.  Mouth/Throat: Uvula is midline, oropharynx is clear and moist and mucous membranes are normal. No oropharyngeal exudate, posterior oropharyngeal edema or posterior oropharyngeal erythema.  Eyes: Conjunctivae and EOM are normal. Pupils are equal, round, and reactive to light. No scleral icterus.  Neck: Normal range of motion. Neck supple. No thyromegaly present.  Cardiovascular: Normal rate, regular rhythm, normal heart sounds and intact distal pulses.  No murmur heard. Pulses:      Radial pulses are 2+ on the right side, and 2+ on the left side.  coupling  Pulmonary/Chest: Effort normal and breath sounds normal. No respiratory distress. He has no wheezes. He has no rales.  Abdominal: Soft. Bowel sounds are normal. He exhibits no distension and no mass. There is no tenderness. There is no rebound and no guarding.  Musculoskeletal: Normal range of motion. He exhibits no edema.  Lymphadenopathy:    He has no cervical adenopathy.  Neurological: He is alert and oriented to person, place, and time.  CN grossly  intact, station and gait intact  Skin: Skin is warm and dry. No rash noted.  Psychiatric: He has a normal mood and affect. His behavior is normal. Judgment and thought content normal.  Nursing note and vitals reviewed.  Results for orders placed or performed in visit on 06/16/17  Comprehensive metabolic panel  Result Value Ref Range   Sodium 142 135 - 145 mEq/L   Potassium 3.7 3.5 - 5.1 mEq/L   Chloride 106 96 - 112 mEq/L   CO2 30 19 - 32 mEq/L   Glucose, Bld 105 (H) 70 - 99 mg/dL   BUN 13 6 - 23 mg/dL   Creatinine, Ser 0.89 0.40 - 1.50 mg/dL   Total Bilirubin 0.6 0.2 - 1.2 mg/dL   Alkaline Phosphatase 54 39 - 117 U/L   AST 22 0 - 37 U/L   ALT 27 0 - 53 U/L   Total Protein 6.9 6.0 - 8.3 g/dL   Albumin 4.0 3.5 - 5.2 g/dL   Calcium 9.6 8.4 - 10.5 mg/dL  GFR 88.43 >60.00 mL/min  Lipid panel  Result Value Ref Range   Cholesterol 162 0 - 200 mg/dL   Triglycerides 351.0 (H) 0.0 - 149.0 mg/dL   HDL 24.00 (L) >39.00 mg/dL   VLDL 70.2 (H) 0.0 - 40.0 mg/dL   Total CHOL/HDL Ratio 7    NonHDL 137.61   LDL cholesterol, direct  Result Value Ref Range   Direct LDL 81.0 mg/dL      Assessment & Plan:   Problem List Items Addressed This Visit    Advanced care planning/counseling discussion    Advanced directives - Received advanced directives, scanned 05/2015. No prolonged life support if terminal condition. No HCPOA form. Wife would be HCPOA. [facility] in [facility].       Dyslipidemia    Chronic, off meds. Reviewed diet changes and lifestyle changes to affect sustainable improvement in HDL, triglycerides The 10-year ASCVD risk score Mikey Bussing DC Jr., et al., 2013) is: 39.1%   Values used to calculate the score:     Age: 17 years     Sex: Male     Is Non-Hispanic African American: No     Diabetic: No     Tobacco smoker: No     Systolic Blood Pressure: 353 mmHg     Is BP treated: Yes     HDL Cholesterol: 24 mg/dL     Total Cholesterol: 162 mg/dL        Fatty liver    LFTs normal      Health maintenance examination - Primary    Preventative protocols reviewed and updated unless pt declined. Discussed healthy diet and lifestyle.       HYPERTENSION, BENIGN ESSENTIAL    Chronic, stable. Continue current regimen.       Relevant Medications   amLODipine (NORVASC) 5 MG tablet   lisinopril (PRINIVIL,ZESTRIL) 5 MG tablet   Insomnia    Chronic, stable on temazepam 30mg  at night time.       Obesity, Class I, BMI 30-34.9    Reviewed healthy diet and lifestyle changes.       Personal history of colonic adenomas    Defer colonoscopy given recent dx metastatic prostate cancer.      Primary malignant 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 [facility])    Appreciate onc care of patient.  PRN tramadol for pain.           Meds ordered this encounter  Medications  . amLODipine (NORVASC) 5 MG tablet    Sig: Take 1 tablet (5 mg total) by mouth daily.    Dispense:  90 tablet    Refill:  3  . gabapentin (NEURONTIN) 300 MG capsule    Sig: Take 1 capsule (300 mg total) by mouth at bedtime.    Dispense:  90 capsule    Refill:  3  . lisinopril (PRINIVIL,ZESTRIL) 5 MG tablet    Sig: Take 1 tablet (5 mg total) by mouth daily.    Dispense:  90 tablet    Refill:  3  . temazepam (RESTORIL) 30 MG capsule    Sig: Take 1 capsule (30 mg total) by mouth at bedtime as needed for sleep.    Dispense:  30 capsule    Refill:  5  . traMADol (ULTRAM) 50 MG tablet    Sig: Take 1 tablet (50 mg total) by mouth 2 (two) times daily as needed.    Dispense:  30 tablet    Refill:  5   No  orders of the defined types were placed in this encounter.   Follow up plan: Return in about 1 year (around 06/18/2018) for annual exam, prior fasting for blood work, medicare wellness visit.  Ria Bush, MD

## 2017-06-18 NOTE — Assessment & Plan Note (Signed)
LFTs normal

## 2017-06-18 NOTE — Assessment & Plan Note (Signed)
Chronic, off meds. Reviewed diet changes and lifestyle changes to affect sustainable improvement in HDL, triglycerides The 10-year ASCVD risk score Mikey Bussing DC Jr., et al., 2013) is: 39.1%   Values used to calculate the score:     Age: 77 years     Sex: Male     Is Non-Hispanic African American: No     Diabetic: No     Tobacco smoker: No     Systolic Blood Pressure: 445 mmHg     Is BP treated: Yes     HDL Cholesterol: 24 mg/dL     Total Cholesterol: 162 mg/dL

## 2017-06-19 ENCOUNTER — Inpatient Hospital Stay: Payer: PPO

## 2017-06-19 ENCOUNTER — Encounter: Payer: Self-pay | Admitting: Oncology

## 2017-06-19 ENCOUNTER — Inpatient Hospital Stay: Payer: PPO | Attending: Oncology | Admitting: Oncology

## 2017-06-19 ENCOUNTER — Other Ambulatory Visit: Payer: Self-pay | Admitting: Family Medicine

## 2017-06-19 VITALS — BP 148/85 | HR 78 | Temp 97.9°F | Resp 16 | Wt 205.0 lb

## 2017-06-19 DIAGNOSIS — C61 Malignant neoplasm of prostate: Secondary | ICD-10-CM

## 2017-06-19 DIAGNOSIS — C7951 Secondary malignant neoplasm of bone: Secondary | ICD-10-CM | POA: Insufficient documentation

## 2017-06-19 DIAGNOSIS — M549 Dorsalgia, unspecified: Secondary | ICD-10-CM | POA: Diagnosis not present

## 2017-06-19 DIAGNOSIS — M2550 Pain in unspecified joint: Secondary | ICD-10-CM

## 2017-06-19 DIAGNOSIS — Z87891 Personal history of nicotine dependence: Secondary | ICD-10-CM | POA: Diagnosis not present

## 2017-06-19 LAB — CBC WITH DIFFERENTIAL/PLATELET
Basophils Absolute: 0.1 10*3/uL (ref 0–0.1)
Basophils Relative: 1 %
EOS ABS: 0.2 10*3/uL (ref 0–0.7)
EOS PCT: 3 %
HCT: 41 % (ref 40.0–52.0)
Hemoglobin: 14.3 g/dL (ref 13.0–18.0)
LYMPHS ABS: 2.1 10*3/uL (ref 1.0–3.6)
Lymphocytes Relative: 40 %
MCH: 31 pg (ref 26.0–34.0)
MCHC: 34.8 g/dL (ref 32.0–36.0)
MCV: 88.9 fL (ref 80.0–100.0)
Monocytes Absolute: 0.5 10*3/uL (ref 0.2–1.0)
Monocytes Relative: 10 %
Neutro Abs: 2.5 10*3/uL (ref 1.4–6.5)
Neutrophils Relative %: 46 %
PLATELETS: 194 10*3/uL (ref 150–440)
RBC: 4.61 MIL/uL (ref 4.40–5.90)
RDW: 13.1 % (ref 11.5–14.5)
WBC: 5.3 10*3/uL (ref 3.8–10.6)

## 2017-06-19 LAB — PSA: PROSTATIC SPECIFIC ANTIGEN: 0.49 ng/mL (ref 0.00–4.00)

## 2017-06-19 MED ORDER — DENOSUMAB 120 MG/1.7ML ~~LOC~~ SOLN
120.0000 mg | Freq: Once | SUBCUTANEOUS | Status: AC
  Administered 2017-06-19: 120 mg via SUBCUTANEOUS
  Filled 2017-06-19: qty 1.7

## 2017-06-19 MED ORDER — HYDROCODONE-ACETAMINOPHEN 5-325 MG PO TABS: 1.0000 | ORAL_TABLET | Freq: Every day | ORAL | 0 refills | Status: DC

## 2017-06-19 NOTE — Telephone Encounter (Signed)
Last filled:  02/25/18, #30 Last OV (CPE):  06/18/17 Next OV:  07/01/18  Rx not on current med list.

## 2017-06-21 NOTE — Progress Notes (Signed)
I reviewed health advisor's note, was available for consultation, and agree with documentation and plan.  

## 2017-06-22 ENCOUNTER — Telehealth: Payer: Self-pay | Admitting: Pharmacist

## 2017-06-22 DIAGNOSIS — C61 Malignant neoplasm of prostate: Secondary | ICD-10-CM

## 2017-06-22 MED ORDER — ENZALUTAMIDE 40 MG PO CAPS: 160.0000 mg | ORAL_CAPSULE | Freq: Every day | ORAL | 2 refills | Status: DC

## 2017-06-22 MED ORDER — ABIRATERONE ACETATE 250 MG PO TABS: 1000.0000 mg | ORAL_TABLET | Freq: Every day | ORAL | 2 refills | Status: DC

## 2017-06-22 NOTE — Telephone Encounter (Signed)
Oral Chemotherapy Pharmacist Encounter   Received a call from Mr. Hemme, he received a letter from is insurance stated that generic Zytiga, abiraterone is now their preferred medication. The letter also gave information about appealing the decision if needed.  Spoke with Dr. Grayland Ormond and he was okay with the patient switching to generic Zytgia.     Darl Pikes, PharmD, BCPS Hematology/Oncology Clinical Pharmacist [facility] [place] [facility] [phone]  06/22/2017 11:42 AM

## 2017-06-22 NOTE — Telephone Encounter (Signed)
Oral Chemotherapy Pharmacist Encounter   Patient's copay for his abiraterone this month is $2212.07. Understandably, Justin Ali states that he can not afford this medication. He unfortunately was previously denied by [employer] for assistance for his Zytiga.  I spoke with Dr. Grayland Ali and he is okay with looking into Xtandi access Justin Ali this patient due to the financial issues with Zytiga. Send Rx to [facility].   Darl Pikes, PharmD, BCPS Hematology/Oncology Clinical Pharmacist [facility] [place] [facility] [phone]  06/22/2017 4:14 PM

## 2017-06-23 ENCOUNTER — Telehealth: Payer: Self-pay | Admitting: Oncology

## 2017-06-23 NOTE — Telephone Encounter (Signed)
Oral Oncology Patient Advocate Encounter  Prior Authorization for Justin Ali has been approved.    PA# [id number] Effective dates: 06/23/2017 through 06/23/18  Oral Oncology Clinic will continue to follow.  Co-pay is $777.82  [place] Patient Advocate [phone] 06/23/2017 9:09 AM

## 2017-06-23 NOTE — Telephone Encounter (Signed)
Oral Oncology Patient Advocate Encounter  Called patient to let him know we had gotten [place] approved thru his insurance. The cost for the Gillermina Phy is $777.82. He said his insurance is sending [place] a letter to change the tier of the medication he is now on. He wants to wait to see what happens with that. I told him I was concerned with him running out of his current medication. He said he will call us by Friday.   I will keep looking for letter and following up with the patient.     [place] Patient Advocate [phone] 06/23/2017 11:12 AM

## 2017-06-23 NOTE — Telephone Encounter (Signed)
Oral Oncology Pharmacist Encounter  Received new prescription for Xtandi (enzalutamide) for the treatment of metastatic prostate cancer, planned duration until disease progression or unacceptable drug toxicity.  BP from 06/19/17 assessed, BP borderline, will continue to monitor once Xtandi started. Prescription dose and frequency assessed.   Current medication list in Epic reviewed, a few DDIs with Gillermina Phy identified: Xtandi may decrease the concentration of amlodipine, hydrocodone/acetaminophen, tramadol. Patient should be monitored for decreased effectiveness of amlodipine, hydrocodone/acetaminophen, tramadol.   Prescription has been e-scribed to the [facility] for benefits analysis and approval.  Oral Oncology Clinic will continue to follow for insurance authorization, copayment issues, initial counseling and start date.  Darl Pikes, PharmD, BCPS Hematology/Oncology Clinical Pharmacist [facility] [place] [facility] [phone]  06/23/2017 9:42 AM

## 2017-06-23 NOTE — Telephone Encounter (Signed)
Oral Oncology Patient Advocate Encounter  Received notification from [employer] that prior authorization for Justin Ali is required.  PA submitted on CoverMyMeds Key [id number] Status is pending  Oral Oncology Clinic will continue to follow.     [facility] Patient Advocate [phone] 06/23/2017 8:50 AM

## 2017-06-24 ENCOUNTER — Telehealth: Payer: Self-pay | Admitting: Oncology

## 2017-06-24 NOTE — Addendum Note (Signed)
Addended by: Darl Pikes on: 06/24/2017 11:05 AM   Modules accepted: Orders

## 2017-06-24 NOTE — Telephone Encounter (Signed)
Oral Oncology Patient Advocate Encounter  Meet patient in lobby and he has decided not to change to [place] he wants to continue with his Abiraterone for now. He has been talking to his insurance company and feels in the long run this will end up being the cheaper way. I told him we will call [facility] to get them to fill his Abiraterone and charge his CC $2,212.07. He said that is fine.    [facility] Patient Advocate [phone] 06/24/2017 10:23 AM

## 2017-06-24 NOTE — Telephone Encounter (Signed)
Oral Chemotherapy Pharmacist Encounter   After presenting the patient with the option of switching to [facility] for the more reasonable copayment, he decided to stick with Zytiga (abiraterone).  Darl Pikes, PharmD, BCPS Hematology/Oncology Clinical Pharmacist [facility] [place] [facility] [phone]  06/24/2017 11:03 AM

## 2017-06-26 ENCOUNTER — Telehealth: Payer: Self-pay | Admitting: Oncology

## 2017-06-26 MED FILL — ABIRATERONE ACETATE 250 MG: 250 | 30 days supply | Qty: 120 | Fill #0

## 2017-06-26 NOTE — Telephone Encounter (Signed)
Oral Oncology Patient Advocate Encounter  Checked to see if patients medication had been shipped. Looked in RX30 and it had not been sent. Patient will be out of medication on Sunday. Looked back and I sent an e-mail to please ship on 06/24/2017.  Called call center and spoke with Hilliard Clark he will make sure it is sent today and patient will have Saturday.    [place] Patient Advocate [phone] 06/26/2017 10:43 AM

## 2017-06-29 ENCOUNTER — Telehealth: Payer: Self-pay | Admitting: Oncology

## 2017-06-29 MED FILL — predniSONE 5 MG TABS: 5 | 30 days supply | Qty: 30 | Fill #3

## 2017-06-29 NOTE — Telephone Encounter (Signed)
Oral Oncology Patient Advocate Encounter  Patient called this morning and did not get his prednisone with his Zytiga. I called [facility] and they are sending today. Called patient to let him know.   [place] Patient Advocate [phone] 06/29/2017 8:46 AM

## 2017-06-30 ENCOUNTER — Telehealth: Payer: Self-pay | Admitting: Pharmacist

## 2017-06-30 NOTE — Telephone Encounter (Signed)
Oral Chemotherapy Pharmacist Encounter   Tier Exception Coverage Determination Form was completed and faxed to [employer]. This was done at the patient's request.    Darl Pikes, PharmD, BCPS Hematology/Oncology Clinical Pharmacist [facility] [place] [facility] [phone]  06/30/2017 4:26 PM

## 2017-07-03 ENCOUNTER — Telehealth: Payer: Self-pay | Admitting: Oncology

## 2017-07-03 NOTE — Telephone Encounter (Signed)
Oral Oncology Patient Advocate Encounter   Spoke with Niecie Q. About patients tier exception form. She stated it was denied on 06/26/2017. She said it was denied because we had said it was no longer needed. I told her we had sent paper work on 06/30/2017. She is going to resubmit the informationa and explain we sent paper work over on 06/30/2017 that needs to be processed. Ref# [id number]. We should here something from them in 24 to 72 hours.    [facility] Patient Advocate [phone] 07/03/2017 8:56 AM

## 2017-07-13 NOTE — Telephone Encounter (Signed)
Received a letter form [employer] that patients coverage for a higher tier at a lower tier cost had been denied.  Will call patient to let him know.    [place] Patient Advocate [phone] 07/13/2017 8:13 AM

## 2017-07-17 ENCOUNTER — Inpatient Hospital Stay: Payer: PPO

## 2017-07-17 ENCOUNTER — Inpatient Hospital Stay: Payer: PPO | Attending: Oncology

## 2017-07-17 VITALS — BP 144/65 | HR 83 | Temp 97.1°F | Resp 20

## 2017-07-17 DIAGNOSIS — C61 Malignant neoplasm of prostate: Secondary | ICD-10-CM

## 2017-07-17 DIAGNOSIS — C7951 Secondary malignant neoplasm of bone: Secondary | ICD-10-CM | POA: Insufficient documentation

## 2017-07-17 LAB — PSA: PROSTATIC SPECIFIC ANTIGEN: 0.46 ng/mL (ref 0.00–4.00)

## 2017-07-17 LAB — CBC WITH DIFFERENTIAL/PLATELET
BASOS ABS: 0 10*3/uL (ref 0–0.1)
BASOS PCT: 1 %
Eosinophils Absolute: 0.1 10*3/uL (ref 0–0.7)
Eosinophils Relative: 3 %
HEMATOCRIT: 39.6 % — AB (ref 40.0–52.0)
HEMOGLOBIN: 14.1 g/dL (ref 13.0–18.0)
LYMPHS PCT: 44 %
Lymphs Abs: 2.2 10*3/uL (ref 1.0–3.6)
MCH: 31.4 pg (ref 26.0–34.0)
MCHC: 35.7 g/dL (ref 32.0–36.0)
MCV: 88.1 fL (ref 80.0–100.0)
MONOS PCT: 8 %
Monocytes Absolute: 0.4 10*3/uL (ref 0.2–1.0)
NEUTROS ABS: 2.2 10*3/uL (ref 1.4–6.5)
NEUTROS PCT: 44 %
Platelets: 191 10*3/uL (ref 150–440)
RBC: 4.49 MIL/uL (ref 4.40–5.90)
RDW: 13.2 % (ref 11.5–14.5)
WBC: 5 10*3/uL (ref 3.8–10.6)

## 2017-07-17 LAB — BASIC METABOLIC PANEL
ANION GAP: 9 (ref 5–15)
BUN: 13 mg/dL (ref 6–20)
CHLORIDE: 106 mmol/L (ref 101–111)
CO2: 27 mmol/L (ref 22–32)
Calcium: 8.8 mg/dL — ABNORMAL LOW (ref 8.9–10.3)
Creatinine, Ser: 0.9 mg/dL (ref 0.61–1.24)
GFR calc non Af Amer: >60 mL/min (ref >60)
Glucose, Bld: 102 mg/dL — ABNORMAL HIGH (ref 65–99)
Potassium: 3.3 mmol/L — ABNORMAL LOW (ref 3.5–5.1)
Sodium: 142 mmol/L (ref 135–145)

## 2017-07-17 MED ORDER — DENOSUMAB 120 MG/1.7ML ~~LOC~~ SOLN
120.0000 mg | Freq: Once | SUBCUTANEOUS | Status: AC
  Administered 2017-07-17: 120 mg via SUBCUTANEOUS

## 2017-07-22 ENCOUNTER — Telehealth: Payer: Self-pay | Admitting: Oncology

## 2017-07-22 MED FILL — ABIRATERONE ACETATE 250 MG: 250 | 30 days supply | Qty: 120 | Fill #1

## 2017-07-22 MED FILL — predniSONE 5 MG TABS: 5 | 30 days supply | Qty: 30 | Fill #4

## 2017-07-22 NOTE — Telephone Encounter (Signed)
Oral Oncology Patient Advocate Encounter  Checked in [phone] to see what patient had to pay for his Abiraterone Acetate this month $217.17 a lot better from last month $1607.03.  Left a message for patient to give me a call to make sure he is ok with that amount.    [place] Patient Advocate [phone] 07/22/2017 10:40 AM

## 2017-08-06 DIAGNOSIS — H02403 Unspecified ptosis of bilateral eyelids: Secondary | ICD-10-CM | POA: Diagnosis not present

## 2017-08-14 ENCOUNTER — Inpatient Hospital Stay: Payer: PPO | Attending: Oncology

## 2017-08-14 ENCOUNTER — Inpatient Hospital Stay: Payer: PPO

## 2017-08-14 VITALS — BP 129/64 | HR 57 | Temp 97.5°F | Resp 20

## 2017-08-14 DIAGNOSIS — C61 Malignant neoplasm of prostate: Secondary | ICD-10-CM

## 2017-08-14 DIAGNOSIS — C7951 Secondary malignant neoplasm of bone: Secondary | ICD-10-CM | POA: Insufficient documentation

## 2017-08-14 LAB — CBC WITH DIFFERENTIAL/PLATELET
BASOS ABS: 0 10*3/uL (ref 0–0.1)
BASOS PCT: 1 %
EOS ABS: 0.2 10*3/uL (ref 0–0.7)
EOS PCT: 4 %
HCT: 37 % — ABNORMAL LOW (ref 40.0–52.0)
Hemoglobin: 13.1 g/dL (ref 13.0–18.0)
LYMPHS PCT: 38 %
Lymphs Abs: 1.9 10*3/uL (ref 1.0–3.6)
MCH: 31.2 pg (ref 26.0–34.0)
MCHC: 35.3 g/dL (ref 32.0–36.0)
MCV: 88.4 fL (ref 80.0–100.0)
MONO ABS: 0.4 10*3/uL (ref 0.2–1.0)
Monocytes Relative: 9 %
Neutro Abs: 2.4 10*3/uL (ref 1.4–6.5)
Neutrophils Relative %: 48 %
PLATELETS: 172 10*3/uL (ref 150–440)
RBC: 4.18 MIL/uL — AB (ref 4.40–5.90)
RDW: 13.4 % (ref 11.5–14.5)
WBC: 5 10*3/uL (ref 3.8–10.6)

## 2017-08-14 LAB — BASIC METABOLIC PANEL
Anion gap: 10 (ref 5–15)
BUN: 18 mg/dL (ref 6–20)
CALCIUM: 9.5 mg/dL (ref 8.9–10.3)
CO2: 24 mmol/L (ref 22–32)
Chloride: 109 mmol/L (ref 101–111)
Creatinine, Ser: 1.13 mg/dL (ref 0.61–1.24)
GFR calc Af Amer: >60 mL/min (ref >60)
GLUCOSE: 107 mg/dL — AB (ref 65–99)
POTASSIUM: 3.6 mmol/L (ref 3.5–5.1)
SODIUM: 143 mmol/L (ref 135–145)

## 2017-08-14 LAB — PSA: PROSTATIC SPECIFIC ANTIGEN: 0.66 ng/mL (ref 0.00–4.00)

## 2017-08-14 MED ORDER — DENOSUMAB 120 MG/1.7ML ~~LOC~~ SOLN
120.0000 mg | Freq: Once | SUBCUTANEOUS | Status: AC
  Administered 2017-08-14: 120 mg via SUBCUTANEOUS

## 2017-08-14 NOTE — Patient Instructions (Signed)

## 2017-08-19 MED FILL — predniSONE 5 MG TABS: 5 | 30 days supply | Qty: 30 | Fill #5

## 2017-08-19 MED FILL — ABIRATERONE ACETATE 250 MG: 250 | 30 days supply | Qty: 120 | Fill #2

## 2017-08-26 ENCOUNTER — Telehealth: Payer: Self-pay | Admitting: Pharmacist

## 2017-08-26 NOTE — Telephone Encounter (Signed)
Oral Chemotherapy Pharmacist Encounter  Follow-Up Form  Called patient today to follow up regarding patient's oral chemotherapy medication: Zytiga (abiraterone)  Original Start date of oral chemotherapy: 09/2016  Pt reports 0 tablets/doses of Zytiga missed in the last month.    Pt reports the following side effects: none reported  Recent labs reviewed: PSA from 08/14/17  New medications?: none reported  Other Issues: N/A  Patient knows to call the office with questions or concerns. Oral Oncology Clinic will continue to follow.  Darl Pikes, PharmD, BCPS Hematology/Oncology Clinical Pharmacist [facility] [place] [facility] [phone]  08/26/2017 4:12 PM

## 2017-09-08 NOTE — Progress Notes (Signed)
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  INTERVAL HISTORY: Patient returns to clinic today for further evaluation and continuation of Xgeva.  He continues to tolerate Zytiga without significant side effects.  He currently feels well and is asymptomatic. He has no neurologic complaints.  He denies any recent fevers or illnesses.  He has no chest pain or shortness of breath.  He denies any nausea, vomiting, constipation, or diarrhea. He has no urinary complaints.  Patient offers no specific complaints today.  REVIEW OF SYSTEMS:   Review of Systems  Constitutional: Negative.  Negative for fever, malaise/fatigue and weight loss.  Respiratory: Negative.  Negative for cough and shortness of breath.   Cardiovascular: Negative.  Negative for chest pain and leg swelling.  Gastrointestinal: Negative.  Negative for abdominal pain.  Genitourinary: Negative.  Negative for frequency and urgency.  Musculoskeletal: Positive for back pain and joint pain.  Skin: Negative.  Negative for rash.  Neurological: Negative.  Negative for sensory change, focal weakness and weakness.  Psychiatric/Behavioral: Negative.  The patient is not nervous/anxious.     As per HPI. Otherwise, a complete review of systems is negative.  PAST MEDICAL HISTORY: Past Medical History:  Diagnosis Date  . 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 CAD (coronary artery disease), native coronary artery 2014   By CT lung (04/2013)   . Cataract   . HLD (hyperlipidemia)   . HTN (hypertension)   . Hyperglycemia   . NAFLD (nonalcoholic fatty  liver disease) 08/2011   by abd [place] with increased LFTs  . OSA (obstructive sleep apnea)    did not tolerate CPAP  . Other benign neoplasm of connective and other soft tissue of unspecified site    Neurofibroma of lateral periorbital area  . Peripheral neuropathy   . Personal history of colonic adenomas 08/05/2012  . Pneumonia 01/2013   CAP LUL  . Restless legs syndrome (RLS)   . Sleep apnea    no cpap    PAST SURGICAL HISTORY: Past Surgical History:  Procedure Laterality Date  . COLONOSCOPY  07/2012   5 adenomas (tubular, TV, serrated), rec rpt 5 months [name])  . COLONOSCOPY  02/2013   residual adenomas, cecal AVM, rec rpt 1 yr [name])  . COLONOSCOPY  03/2014   no residual polyp, cecal AVM, rpt 3-4 yrs [name])  . ORIF ANKLE FRACTURE  11/29/00   Dr. Tamala Julian  . SKIN CANCER EXCISION  1997   left anterior neck    FAMILY HISTORY: Family History  Problem Relation Age of Onset  . Cancer Father        liver  . Alcohol abuse Father   . Liver cancer Father   . Cancer Mother        breast  . Breast cancer Mother   . Hypertension Brother        Half-brother  . Coronary artery disease Paternal Aunt        CABG  . Cancer Other        GM; Aunt - breast  . Colon cancer Neg Hx   . Rectal cancer Neg Hx   . Stomach cancer Neg Hx   . Esophageal  cancer Neg Hx     ADVANCED DIRECTIVES (Y/N):  N  HEALTH MAINTENANCE: Social History   Tobacco Use  . Smoking status: Former Smoker    Types: Cigarettes, Cigars    Last attempt to quit: 04/28/2002    Years since quitting: 15.3  . Smokeless tobacco: Never Used  . Tobacco comment: rare cigar  Substance Use Topics  . Alcohol use: Yes    Alcohol/week: 2.4 oz    Types: 2 Standard drinks or equivalent, 2 Shots of liquor per week    Comment: Weekends  . Drug use: No     Colonoscopy:  PAP:  Bone density:  Lipid panel:  No Known Allergies  Current Outpatient Medications  Medication Sig Dispense Refill  . abiraterone acetate  (ZYTIGA) 250 MG tablet TAKE 4 TABLETS (1,000 MG TOTAL) BY MOUTH DAILY. TAKE ON AN EMPTY STOMACH 1 HOUR BEFORE OR 2 HOURS AFTER A MEAL 120 tablet 2  . amLODipine (NORVASC) 5 MG tablet Take 1 tablet (5 mg total) by mouth daily. 90 tablet 3  . CALCIUM PO Take 1,200 mg by mouth daily.    . cholecalciferol (VITAMIN D) 1000 UNITS tablet Take 2,000 Units by mouth daily.     Marland Kitchen gabapentin (NEURONTIN) 300 MG capsule Take 1 capsule (300 mg total) by mouth at bedtime. 90 capsule 3  . HYDROcodone-acetaminophen (NORCO/VICODIN) 5-325 MG tablet Take 1 tablet by mouth daily. As needed. 60 tablet 0  . lisinopril (PRINIVIL,ZESTRIL) 5 MG tablet Take 1 tablet (5 mg total) by mouth daily. 90 tablet 3  . Multiple Vitamins-Minerals (MULTIVITAMIN PO) Take 1 tablet by mouth daily.    . predniSONE (DELTASONE) 5 MG tablet TAKE 1 TABLET BY MOUTH DAILY WITH BREAKFAST. 30 tablet 5  . temazepam (RESTORIL) 30 MG capsule Take 1 capsule (30 mg total) by mouth at bedtime as needed for sleep. 30 capsule 5  . traMADol (ULTRAM) 50 MG tablet Take 1 tablet (50 mg total) by mouth 2 (two) times daily as needed. 30 tablet 5   No current facility-administered medications for this visit.     OBJECTIVE: Vitals:   09/11/17 0859  BP: (!) 151/72  Pulse: 65  Resp: 20  Temp: (!) 97.5 F (36.4 C)     Body mass index is 32.45 kg/m.    ECOG FS:0 - Asymptomatic  General: Well-developed, well-nourished, no acute distress. Eyes: Pink conjunctiva, anicteric sclera. Lungs: Clear to auscultation bilaterally. Heart: Regular rate and rhythm. No rubs, murmurs, or gallops. Abdomen: Soft, nontender, nondistended. No organomegaly noted, normoactive bowel sounds. Musculoskeletal: No edema, cyanosis, or clubbing. Neuro: Alert, answering all questions appropriately. Cranial nerves grossly intact. Skin: No rashes or petechiae noted. Psych: Normal affect.  LAB RESULTS:  Lab Results  Component Value Date   NA 141 09/11/2017   K 3.3 (L) 09/11/2017     CL 105 09/11/2017   CO2 26 09/11/2017   GLUCOSE 94 09/11/2017   BUN 14 09/11/2017   CREATININE 1.01 09/11/2017   CALCIUM 9.1 09/11/2017   PROT 6.9 06/16/2017   ALBUMIN 4.0 06/16/2017   AST 22 06/16/2017   ALT 27 06/16/2017   ALKPHOS 54 06/16/2017   BILITOT 0.6 06/16/2017   GFRNONAA >60 09/11/2017   GFRAA >60 09/11/2017    Lab Results  Component Value Date   WBC 4.6 09/11/2017   NEUTROABS 2.2 09/11/2017   HGB 13.4 09/11/2017   HCT 38.7 (L) 09/11/2017   MCV 89.1 09/11/2017   PLT 178 09/11/2017     STUDIES: No  results found.  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   PLAN:    1. 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 Prostate biopsy results revealed Gleason's 8 (4+4) adenocarcinoma.  His most recent bone scan on January 15, 2017 revealed improvement of his bony metastasis.  PSA stable at 0.58. Continue Zytiga and prednisone as prescribed until intolerable side effects or progression of disease.  Proceed with Xgeva today.  Patient initiated treatment on January 23, 2017.  Continue oral calcium supplementation.  Return to clinic in 1 and 2 months for laboratory work and Xgeva only and then in 3 months for further evaluation.   2. Hypocalcemia: Resolved.  Continue calcium and vitamin D supplementation. Proceed with Xgeva as above. Monitor. 3. Pain: Patient does not complain of this today.  Approximately 30 minutes was spent in discussion of which greater than 50% was consultation.  Patient expressed understanding and was in agreement with this plan. He also understands that He can call clinic at any time with any questions, concerns, or complaints.   Cancer Staging Primary malignant 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 [facility]) Staging form:  Prostate, AJCC 8th Edition - Clinical stage from 09/26/2016: Stage IVB (cTX, cN1, cM1c, PSA: 193) - Signed by Lloyd Huger, MD on 09/26/2016   Lloyd Huger, MD   09/12/2017 10:00 AM

## 2017-09-10 ENCOUNTER — Other Ambulatory Visit: Payer: Self-pay | Admitting: Oncology

## 2017-09-10 DIAGNOSIS — C61 Malignant neoplasm of prostate: Secondary | ICD-10-CM

## 2017-09-11 ENCOUNTER — Inpatient Hospital Stay: Payer: PPO

## 2017-09-11 ENCOUNTER — Inpatient Hospital Stay: Payer: PPO | Attending: Oncology | Admitting: Oncology

## 2017-09-11 VITALS — BP 151/72 | HR 65 | Temp 97.5°F | Resp 20 | Wt 201.1 lb

## 2017-09-11 DIAGNOSIS — I10 Essential (primary) hypertension: Secondary | ICD-10-CM

## 2017-09-11 DIAGNOSIS — C61 Malignant neoplasm of prostate: Secondary | ICD-10-CM | POA: Diagnosis not present

## 2017-09-11 DIAGNOSIS — C7951 Secondary malignant neoplasm of bone: Secondary | ICD-10-CM | POA: Insufficient documentation

## 2017-09-11 DIAGNOSIS — Z87891 Personal history of nicotine dependence: Secondary | ICD-10-CM | POA: Diagnosis not present

## 2017-09-11 LAB — CBC WITH DIFFERENTIAL/PLATELET
BASOS ABS: 0 10*3/uL (ref 0–0.1)
Basophils Relative: 1 %
Eosinophils Absolute: 0.2 10*3/uL (ref 0–0.7)
Eosinophils Relative: 4 %
HEMATOCRIT: 38.7 % — AB (ref 40.0–52.0)
HEMOGLOBIN: 13.4 g/dL (ref 13.0–18.0)
Lymphocytes Relative: 41 %
Lymphs Abs: 1.9 10*3/uL (ref 1.0–3.6)
MCH: 30.8 pg (ref 26.0–34.0)
MCHC: 34.6 g/dL (ref 32.0–36.0)
MCV: 89.1 fL (ref 80.0–100.0)
Monocytes Absolute: 0.3 10*3/uL (ref 0.2–1.0)
Monocytes Relative: 8 %
NEUTROS ABS: 2.2 10*3/uL (ref 1.4–6.5)
NEUTROS PCT: 46 %
PLATELETS: 178 10*3/uL (ref 150–440)
RBC: 4.35 MIL/uL — AB (ref 4.40–5.90)
RDW: 13.9 % (ref 11.5–14.5)
WBC: 4.6 10*3/uL (ref 3.8–10.6)

## 2017-09-11 LAB — BASIC METABOLIC PANEL
ANION GAP: 10 (ref 5–15)
BUN: 14 mg/dL (ref 6–20)
CHLORIDE: 105 mmol/L (ref 101–111)
CO2: 26 mmol/L (ref 22–32)
Calcium: 9.1 mg/dL (ref 8.9–10.3)
Creatinine, Ser: 1.01 mg/dL (ref 0.61–1.24)
Glucose, Bld: 94 mg/dL (ref 65–99)
Potassium: 3.3 mmol/L — ABNORMAL LOW (ref 3.5–5.1)
SODIUM: 141 mmol/L (ref 135–145)

## 2017-09-11 LAB — PSA: PROSTATIC SPECIFIC ANTIGEN: 0.58 ng/mL (ref 0.00–4.00)

## 2017-09-11 MED ORDER — DENOSUMAB 120 MG/1.7ML ~~LOC~~ SOLN
120.0000 mg | Freq: Once | SUBCUTANEOUS | Status: AC
  Administered 2017-09-11: 120 mg via SUBCUTANEOUS

## 2017-09-11 NOTE — Progress Notes (Signed)
Patient denies any concerns today.  

## 2017-09-15 MED FILL — ABIRATERONE ACETATE 250 MG: 250 | 30 days supply | Qty: 120 | Fill #0

## 2017-09-15 MED FILL — predniSONE 5 MG TABS: 5 | 30 days supply | Qty: 30 | Fill #0

## 2017-09-24 NOTE — Discharge Instructions (Signed)
INSTRUCTIONS FOLLOWING OCULOPLASTIC SURGERY °AMY M. FOWLER, MD ° °AFTER YOUR EYE SURGERY, THER ARE MANY THINGS THWIHC YOU, THE PATIENT, CAN DO TO ASSURE THE BEST POSSIBLE RESULT FROM YOUR OPERATION.  THIS SHEET SHOULD BE REFERRED TO WHENEVER QUESTIONS ARISE.  IF THERE ARE ANY QUESTIONS NOT ANSWERED HERE, DO NOT HESITATE TO CALL OUR OFFICE AT [phone] OR [phone].  THERE IS ALWAYS [facility] AVAILABLE TO CALL IF QUESTIONS OR PROBLEMS ARISE. ° °VISION: Your vision may be blurred and out of focus after surgery until you are able to stop using your ointment, swelling resolves and your eye(s) heal. This may take 1 to 2 weeks at the least.  If your vision becomes gradually more dim or dark, this is not normal and you need to call our office immediately. ° °EYE CARE: For the first 48 hours after surgery, use ice packs frequently - “20 minutes on, 20 minutes off” - to help reduce swelling and bruising.  Small bags of frozen peas or corn make good ice packs along with cloths soaked in ice water.  If you are wearing a patch or other type of dressing following surgery, keep this on for the amount of time specified by your doctor.  For the first week following surgery, you will need to treat your stitches with great care.  If is OK to shower, but take care to not allow soapy water to run into your eye(s) to help reduce changes of infection.  You may gently clean the eyelashes and around the eye(s) with cotton balls and sterile water, BUT DO NOT RUB THE STITCHES VIGOROUSLY.  Keeping your stitches moist with ointment will help promote healing with minimal scar formation. ° °ACTIVITY: When you leave the surgery center, you should go home, rest and be inactive.  The eye(s) may feel scratchy and keeping the eyes closed will allow for faster healing.  The first week following surgery, avoid straining (anything making the face turn red) or lifting over 20 pounds.  Additionally, avoid bending which causes your head to go below  your waist.  Using your eyes will NOT harm them, so feel free to read, watch television, use the computer, etc as desired.  Driving depends on each individual, so check with your doctor if you have questions about driving. ° °MEDICATIONS:  You will be given a prescription for an ointment to use 4 times a day on your stitches.  You can use the ointment in your eyes if they feel scratchy or irritated.  If you eyelid(s) don’t close completely when you sleep, put some ointment in your eyes before bedtime. ° °EMERGENCY: If you experience SEVERE EYE PAIN OR HEADACHE UNRELIEVED BY TYLENOL OR PERCOCET, NAUSEA OR VOMITING, WORSENING REDNESS, OR WORSENING VISION (ESPECIALLY VISION THAT WA INITIALLY BETTER) CALL [phone] OR [phone] DURING BUSINESS HOURS OR AFTER HOURS. ° °General Anesthesia, Adult, Care After °These instructions provide you with information about caring for yourself after your procedure. Your health care provider may also give you more specific instructions. Your treatment has been planned according to current medical practices, but problems sometimes occur. Call your health care provider if you have any problems or questions after your procedure. °What can I expect after the procedure? °After the procedure, it is common to have: °· Vomiting. °· A sore throat. °· Mental slowness. ° °It is common to feel: °· Nauseous. °· Cold or shivery. °· Sleepy. °· Tired. °· Sore or achy, even in parts of your body where you did not have surgery. ° °  Follow these instructions at home: °For at least 24 hours after the procedure: °· Do not: °? Participate in activities where you could fall or become injured. °? Drive. °? Use heavy machinery. °? Drink alcohol. °? Take sleeping pills or medicines that cause drowsiness. °? Make important decisions or sign legal documents. °? Take care of children on your own. °· Rest. °Eating and drinking °· If you vomit, drink water, juice, or soup when you can drink without  vomiting. °· Drink enough fluid to keep your urine clear or pale yellow. °· Make sure you have little or no nausea before eating solid foods. °· Follow the diet recommended by your health care provider. °General instructions °· Have a responsible adult stay with you until you are awake and alert. °· Return to your normal activities as told by your health care provider. Ask your health care provider what activities are safe for you. °· Take over-the-counter and prescription medicines only as told by your health care provider. °· If you smoke, do not smoke without supervision. °· Keep all follow-up visits as told by your health care provider. This is important. °Contact a health care provider if: °· You continue to have nausea or vomiting at home, and medicines are not helpful. °· You cannot drink fluids or start eating again. °· You cannot urinate after 8-12 hours. °· You develop a skin rash. °· You have fever. °· You have increasing redness at the site of your procedure. °Get help right away if: °· You have difficulty breathing. °· You have chest pain. °· You have unexpected bleeding. °· You feel that you are having a life-threatening or urgent problem. °This information is not intended to replace advice given to you by your health care provider. Make sure you discuss any questions you have with your health care provider. °Document Released: 07/21/2000 Document Revised: 09/17/2015 Document Reviewed: 03/29/2015 °Elsevier Interactive Patient Education © 2018 [employer] ° °

## 2017-09-29 ENCOUNTER — Ambulatory Visit: Payer: PPO | Admitting: Anesthesiology

## 2017-09-29 ENCOUNTER — Ambulatory Visit
Admission: RE | Admit: 2017-09-29 | Discharge: 2017-09-29 | Disposition: A | Discharge status: Home / Self Care | Payer: PPO | Source: Ambulatory Visit | Attending: Ophthalmology | Admitting: Ophthalmology

## 2017-09-29 ENCOUNTER — Encounter
Admission: RE | Disposition: A | Discharge status: Home / Self Care | Payer: Self-pay | Source: Ambulatory Visit | Attending: Ophthalmology

## 2017-09-29 DIAGNOSIS — I2510 Atherosclerotic heart disease of native coronary artery without angina pectoris: Secondary | ICD-10-CM | POA: Diagnosis not present

## 2017-09-29 DIAGNOSIS — Z87891 Personal history of nicotine dependence: Secondary | ICD-10-CM | POA: Diagnosis not present

## 2017-09-29 DIAGNOSIS — H0259 Other disorders affecting eyelid function: Secondary | ICD-10-CM | POA: Diagnosis not present

## 2017-09-29 DIAGNOSIS — H02131 Senile ectropion of right upper eyelid: Secondary | ICD-10-CM | POA: Diagnosis not present

## 2017-09-29 DIAGNOSIS — H02101 Unspecified ectropion of right upper eyelid: Secondary | ICD-10-CM | POA: Diagnosis not present

## 2017-09-29 DIAGNOSIS — G4730 Sleep apnea, unspecified: Secondary | ICD-10-CM | POA: Diagnosis not present

## 2017-09-29 DIAGNOSIS — I10 Essential (primary) hypertension: Secondary | ICD-10-CM | POA: Diagnosis not present

## 2017-09-29 DIAGNOSIS — H02403 Unspecified ptosis of bilateral eyelids: Secondary | ICD-10-CM | POA: Insufficient documentation

## 2017-09-29 DIAGNOSIS — H02134 Senile ectropion of left upper eyelid: Secondary | ICD-10-CM | POA: Diagnosis not present

## 2017-09-29 DIAGNOSIS — H02104 Unspecified ectropion of left upper eyelid: Secondary | ICD-10-CM | POA: Insufficient documentation

## 2017-09-29 DIAGNOSIS — H02102 Unspecified ectropion of right lower eyelid: Secondary | ICD-10-CM | POA: Diagnosis not present

## 2017-09-29 DIAGNOSIS — I499 Cardiac arrhythmia, unspecified: Secondary | ICD-10-CM | POA: Diagnosis not present

## 2017-09-29 DIAGNOSIS — Z79899 Other long term (current) drug therapy: Secondary | ICD-10-CM | POA: Insufficient documentation

## 2017-09-29 DIAGNOSIS — K760 Fatty (change of) liver, not elsewhere classified: Secondary | ICD-10-CM | POA: Diagnosis not present

## 2017-09-29 DIAGNOSIS — H02105 Unspecified ectropion of left lower eyelid: Secondary | ICD-10-CM | POA: Insufficient documentation

## 2017-09-29 DIAGNOSIS — H02135 Senile ectropion of left lower eyelid: Secondary | ICD-10-CM | POA: Diagnosis not present

## 2017-09-29 DIAGNOSIS — H02132 Senile ectropion of right lower eyelid: Secondary | ICD-10-CM | POA: Diagnosis not present

## 2017-09-29 HISTORY — PX: PTOSIS REPAIR: SHX6568

## 2017-09-29 HISTORY — PX: ECTROPION REPAIR: SHX357

## 2017-09-29 SURGERY — REPAIR, BLEPHAROPTOSIS
Anesthesia: Monitor Anesthesia Care | Laterality: Bilateral | Wound class: Clean

## 2017-09-29 MED ORDER — LIDOCAINE-EPINEPHRINE 2 %-1:100000 IJ SOLN
INTRAMUSCULAR | Status: DC | PRN
  Administered 2017-09-29: 10 mL via OPHTHALMIC
  Administered 2017-09-29: 1 mL via OPHTHALMIC

## 2017-09-29 MED ORDER — ACETAMINOPHEN 325 MG PO TABS
975.0000 mg | ORAL_TABLET | Freq: Once | ORAL | Status: AC
  Administered 2017-09-29: 975 mg via ORAL

## 2017-09-29 MED ORDER — TRAMADOL HCL 50 MG PO TABS: ORAL_TABLET | ORAL | 0 refills | Status: DC

## 2017-09-29 MED ORDER — LACTATED RINGERS IV SOLN: 1000.0000 mL | INTRAVENOUS | Status: DC

## 2017-09-29 MED ORDER — LACTATED RINGERS IV SOLN
INTRAVENOUS | Status: DC
  Administered 2017-09-29: 13:00:00 via INTRAVENOUS

## 2017-09-29 MED ORDER — TETRACAINE HCL 0.5 % OP SOLN
OPHTHALMIC | Status: DC | PRN
  Administered 2017-09-29: 2 [drp] via OPHTHALMIC

## 2017-09-29 MED ORDER — PROPOFOL 500 MG/50ML IV EMUL
INTRAVENOUS | Status: DC | PRN
  Administered 2017-09-29: 50 ug/kg/min via INTRAVENOUS

## 2017-09-29 MED ORDER — MIDAZOLAM HCL 2 MG/2ML IJ SOLN
INTRAMUSCULAR | Status: DC | PRN
  Administered 2017-09-29 (×2): 1 mg via INTRAVENOUS

## 2017-09-29 MED ORDER — DEXMEDETOMIDINE HCL 200 MCG/2ML IV SOLN
INTRAVENOUS | Status: DC | PRN
  Administered 2017-09-29 (×2): 4 ug via INTRAVENOUS

## 2017-09-29 MED ORDER — ALFENTANIL 500 MCG/ML IJ INJ
INJECTION | INTRAVENOUS | Status: DC | PRN
  Administered 2017-09-29: 20 ug via INTRAVENOUS
  Administered 2017-09-29: 200 ug via INTRAVENOUS
  Administered 2017-09-29: 500 ug via INTRAVENOUS
  Administered 2017-09-29: 100 ug via INTRAVENOUS

## 2017-09-29 MED ORDER — ERYTHROMYCIN 5 MG/GM OP OINT: TOPICAL_OINTMENT | OPHTHALMIC | 3 refills | Status: DC

## 2017-09-29 SURGICAL SUPPLY — 25 items
APPLICATOR COTTON TIP WD 3 STR (MISCELLANEOUS) ×6 IMPLANT
BLADE SURG 15 STRL LF DISP TIS (BLADE) ×1 IMPLANT
BLADE SURG 15 STRL SS (BLADE) ×2
CORD BIP STRL DISP 12FT (MISCELLANEOUS) ×3 IMPLANT
DRAPE HEAD BAR (DRAPES) ×3 IMPLANT
GAUZE SPONGE 4X4 12PLY STRL (GAUZE/BANDAGES/DRESSINGS) ×3 IMPLANT
GLOVE SURG LX 7.0 MICRO (GLOVE) ×4
GLOVE SURG LX STRL 7.0 MICRO (GLOVE) ×2 IMPLANT
MARKER SKIN XFINE TIP W/RULER (MISCELLANEOUS) ×3 IMPLANT
NEEDLE FILTER BLUNT 18X 1/2SAF (NEEDLE) ×2
NEEDLE FILTER BLUNT 18X1 1/2 (NEEDLE) ×1 IMPLANT
NEEDLE HYPO 30X.5 LL (NEEDLE) ×6 IMPLANT
PACK DRAPE NASAL/ENT (PACKS) ×3 IMPLANT
SOL PREP PVP 2OZ (MISCELLANEOUS) ×3
SOLUTION PREP PVP 2OZ (MISCELLANEOUS) ×1 IMPLANT
SPONGE GAUZE 2X2 8PLY STER LF (GAUZE/BANDAGES/DRESSINGS) ×10
SPONGE GAUZE 2X2 8PLY STRL LF (GAUZE/BANDAGES/DRESSINGS) ×20 IMPLANT
SUT MERSILENE 4-0 S-2 (SUTURE) ×6 IMPLANT
SUT PLAIN GUT (SUTURE) ×3 IMPLANT
SUT PROLENE 6 0 P 1 18 (SUTURE) ×6 IMPLANT
SUT VICRYL 6-0  S14 CTD (SUTURE) ×2
SUT VICRYL 6-0 S14 CTD (SUTURE) ×1 IMPLANT
SYR 10ML LL (SYRINGE) ×3 IMPLANT
SYR 3ML LL SCALE MARK (SYRINGE) ×3 IMPLANT
WATER STERILE IRR 250ML POUR (IV SOLUTION) ×3 IMPLANT

## 2017-09-29 NOTE — Anesthesia Preprocedure Evaluation (Addendum)
Anesthesia Evaluation  Patient identified by MRN, date of birth, ID band Patient awake    Reviewed: Allergy & Precautions, NPO status , Patient's Chart, lab work & pertinent test results, reviewed documented beta blocker date and time   Airway Mallampati: II  TM Distance: >3 FB Neck ROM: Full    Dental no notable dental hx.    Pulmonary sleep apnea , pneumonia, former smoker,    Pulmonary exam normal breath sounds clear to auscultation       Cardiovascular hypertension, + CAD (By CT scan)  Normal cardiovascular exam Rhythm:Regular Rate:Normal  Possibly irregular rhythm noted on tele. Patient has no symptoms of chest pain, DOE. He is rate controlled. EKG done and found sinus rhythm rate ~60 with PACs   Neuro/Psych  Neuromuscular disease negative psych ROS   GI/Hepatic negative GI ROS, NAFLD   Endo/Other  negative endocrine ROS  Renal/GU negative Renal ROS     Musculoskeletal negative musculoskeletal ROS (+)   Abdominal (+) + obese,   Peds  Hematology Prostate cancer mets to bone   Anesthesia Other Findings   Reproductive/Obstetrics                           Anesthesia Physical Anesthesia Plan  ASA: III  Anesthesia Plan: MAC   Post-op Pain Management:    Induction: Intravenous  PONV Risk Score and Plan:   Airway Management Planned: Natural Airway  Additional Equipment: None  Intra-op Plan:   Post-operative Plan:   Informed Consent: I have reviewed the patients History and Physical, chart, labs and discussed the procedure including the risks, benefits and alternatives for the proposed anesthesia with the patient or authorized representative who has indicated his/her understanding and acceptance.     Plan Discussed with: CRNA, Anesthesiologist and Surgeon  Anesthesia Plan Comments:        Anesthesia Quick Evaluation

## 2017-09-29 NOTE — Anesthesia Postprocedure Evaluation (Signed)
Anesthesia Post Note  Patient: Justin Ali  Procedure(s) Performed: BLEPHAROPTOSIS REPAIR RESECT EX UPPER (Bilateral ) REPAIR OF ECTROPION, EXTENSIVE UPPER AND LOWER (Bilateral )  Patient location during evaluation: PACU Anesthesia Type: General Level of consciousness: awake Pain management: pain level controlled Vital Signs Assessment: post-procedure vital signs reviewed and stable Respiratory status: spontaneous breathing Cardiovascular status: blood pressure returned to baseline Postop Assessment: no headache Anesthetic complications: no    Lavonna Monarch

## 2017-09-29 NOTE — Anesthesia Procedure Notes (Signed)
Performed by: Benedicta Sultan, [place], CRNA Pre-anesthesia Checklist: Patient identified, Emergency Drugs available, Suction available, Timeout performed and Patient being monitored Patient Re-evaluated:Patient Re-evaluated prior to induction Oxygen Delivery Method: Nasal cannula Placement Confirmation: positive ETCO2       

## 2017-09-29 NOTE — H&P (Signed)
See the history and physical completed at [facility] on 09/16/17 and scanned into the chart.

## 2017-09-29 NOTE — Interval H&P Note (Signed)
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 After consideration of risks, benefits and other options for treatment, the patient has consented to  Procedure(s): BLEPHAROPTOSIS REPAIR RESECT EX UPPER (Bilateral) REPAIR OF ECTROPION, EXTENSIVE UPPER AND LOWER (Bilateral) as a surgical intervention .  The patient's history has been reviewed, patient examined, no change in status, stable for surgery.  I have reviewed the patient's chart and labs.  Questions were answered to the patient's satisfaction.     Vickki Muff, Charmika Macdonnell M

## 2017-09-29 NOTE — Transfer of Care (Signed)
Immediate Anesthesia Transfer of Care Note  Patient: Justin Ali  Procedure(s) Performed: BLEPHAROPTOSIS REPAIR RESECT EX UPPER (Bilateral ) REPAIR OF ECTROPION, EXTENSIVE UPPER AND LOWER (Bilateral )  Patient Location: PACU  Anesthesia Type: General  Level of Consciousness: awake, alert  and patient cooperative  Airway and Oxygen Therapy: Patient Spontanous Breathing and Patient connected to supplemental oxygen  Post-op Assessment: Post-op Vital signs reviewed, Patient's Cardiovascular Status Stable, Respiratory Function Stable, Patent Airway and No signs of Nausea or vomiting  Post-op Vital Signs: Reviewed and stable  Complications: No apparent anesthesia complications

## 2017-09-29 NOTE — Op Note (Signed)
Preoperative Diagnosis:   Eyelid laxity with ectropion, bilateral lower eyelid(s). Eyelid laxity with ectropion, bilateral upper eyelid(s) Visually significant blepharoptosis bilateral upper Eyelid(s)  Postoperative Diagnosis:   Same.  Procedure(s) Performed:  Lateral tarsal strip procedure, bilateral lower eyelid(s). Lateral tarsal strip procedure, bilateral upper eyelid(s). Blepharoptosis repair with levator aponeurosis advancement bilateral upper Eyelid(s)  Teaching Surgeon: Philis Pique. Vickki Muff, M.D.  Assistants: none  Anesthesia: MAC  Specimens: None.  Estimated Blood Loss: Minimal.  Complications: None.  Operative Findings: None   Procedure:   Allergies were reviewed and the patient Patient has no known allergies..    After discussing the risks, benefits, complications, and alternatives with the patient, appropriate informed consent was obtained. The patient was brought to the operating suite and reclined supine. Time out was conducted and the patient was sedated.  Local anesthetic consisting of a 50/50 mixture of 2% lidocaine with epinephrine and 0.75% bupivacaine with added Hylenex was injected subcutaneously to the bilateral lateral canthal region(s), lower and upper eyelid(s). Additional anesthetic was injected subconjunctivally to the bilateral upper and lower eyelid(s). Finally, anesthetic was injected down to the periosteum of the lateral lateral orbital rim(s).  After adequate local was instilled, the patient was prepped and draped in the usual sterile fashion for eyelid surgery.   Attention was turned to the right lateral canthal angle. Westcott scissors were used to create a lateral canthotomy. Hemostasis was obtained with bipolar cautery. An inferior and superior cantholysis was then performed with additional bipolar hemostasis. The anterior and posterior lamella of both the upper and lower eyelids were divided for approximately 12 mm.  A strip of the epithelium was  excised off the superior margin of both tarsal strips and conjunctiva and retractors were incised off the margins of the tarsal strips.  2 interrupted 6-0 Vicryl sutures were used to adhere the margins of the tarsal strips together.  A double-armed 4-0 Mersilene suture was then passed each arm through the terminal portion of the upper and lower tarsal strip with a locking pass. Each arm of the suture was then passed through the periosteum of the inner portion of the lateral orbital rim at the level of Whitnall's tubercle. The sutures were advanced and this provided nice elevation and tightening of both eyelids. Once the suture was secured, a thin strip of follicle-bearing skin was excised off the upper and lower eyelids. The lateral canthal angle was reformed with an interrupted 6-0 Vicryl suture. Orbicularis was reapproximated with horizontal subcuticular 6-0 Vicryl sutures. The skin was closed with interrupted 6-0 fast absorbing plain gut sutures.   Attention was then turned to the opposite eyelid where the same procedure was performed in the same manner.   Attention was turned to the upper eyelids. A 60m upper eyelid crease incision line was marked with calipers on both upper eyelid(s).  Attention was turned to the right upper eyelid. A #15 blade was used to open the premarked incision line and hemostasis was obtained with bipolar cautery. Westcott scissors were then used to transect through orbicularis for the length of the incision down to the tarsal plate. Epitarsus was dissected to create a smooth surface to suture to. Dissection was then carried superiorly in the plane between orbicularis and orbital septum. Once the preaponeurotic fat pocket was identified, the orbital septum was opened. This revealed the levator and its aponeurosis.    Attention was then turned to the opposite eyelid where the same procedure was performed in the same manner.   2 interrupted 6-0 Prolene  sutures were then passed  partial thickness through the tarsal plates of both upper eyelid(s). These sutures were placed in line with the mid pupillary and lateral limbal lines. The sutures were fixed to the levator aponeurosis and adjusted until a nice lid height and contour were achieved. Once nice symmetry was achieved, the skin incisions were closed with a running 6-0 fast absorbing plain suture.  The patient tolerated the procedure well.  Erythromycin ophthalmic ointment was applied to the incision site(s) followed by ice packs. The patient was taken to the recovery area where they recovered without difficulty.  Post-Op Plan/Instructions:  The patient was instructed to use ice packs frequently for the next 48 hours.  They were instructed to use erythromycin ophthalmic ointment on their incisions 4 times a day for the next 12 to 14 days.  They were given a prescription for Percocet or tramadol for pain control should Tylenol not be effective.  They were asked to to follow up at the [facility] in [place], [place] in 2 weeks' time or sooner as needed for problems.  Erric Machnik M. Vickki Muff, M.D. Attending,Ophthalmology

## 2017-10-01 ENCOUNTER — Encounter: Payer: Self-pay | Admitting: Ophthalmology

## 2017-10-01 NOTE — Addendum Note (Signed)
Addendum  created 10/01/17 1558 by Darrin Nipper, MD   Intraprocedure SmartForms edited, Sign clinical note

## 2017-10-02 ENCOUNTER — Other Ambulatory Visit: Payer: Self-pay | Admitting: Respiratory Therapy

## 2017-10-09 ENCOUNTER — Ambulatory Visit: Payer: PPO

## 2017-10-09 ENCOUNTER — Inpatient Hospital Stay: Payer: PPO | Attending: Oncology

## 2017-10-09 VITALS — BP 137/76 | HR 70 | Resp 18

## 2017-10-09 DIAGNOSIS — C7951 Secondary malignant neoplasm of bone: Secondary | ICD-10-CM | POA: Insufficient documentation

## 2017-10-09 DIAGNOSIS — C61 Malignant neoplasm of prostate: Secondary | ICD-10-CM

## 2017-10-09 LAB — BASIC METABOLIC PANEL
ANION GAP: 10 (ref 5–15)
BUN: 15 mg/dL (ref 6–20)
CALCIUM: 9.5 mg/dL (ref 8.9–10.3)
CO2: 27 mmol/L (ref 22–32)
CREATININE: 1.06 mg/dL (ref 0.61–1.24)
Chloride: 104 mmol/L (ref 101–111)
Glucose, Bld: 110 mg/dL — ABNORMAL HIGH (ref 65–99)
Potassium: 3.9 mmol/L (ref 3.5–5.1)
SODIUM: 141 mmol/L (ref 135–145)

## 2017-10-09 LAB — CBC WITH DIFFERENTIAL/PLATELET
BASOS ABS: 0.1 10*3/uL (ref 0–0.1)
BASOS PCT: 2 %
EOS ABS: 0.2 10*3/uL (ref 0–0.7)
Eosinophils Relative: 4 %
HEMATOCRIT: 40.3 % (ref 40.0–52.0)
HEMOGLOBIN: 14 g/dL (ref 13.0–18.0)
Lymphocytes Relative: 41 %
Lymphs Abs: 2.1 10*3/uL (ref 1.0–3.6)
MCH: 30.9 pg (ref 26.0–34.0)
MCHC: 34.7 g/dL (ref 32.0–36.0)
MCV: 89 fL (ref 80.0–100.0)
Monocytes Absolute: 0.3 10*3/uL (ref 0.2–1.0)
Monocytes Relative: 7 %
NEUTROS ABS: 2.4 10*3/uL (ref 1.4–6.5)
NEUTROS PCT: 48 %
Platelets: 178 10*3/uL (ref 150–440)
RBC: 4.53 MIL/uL (ref 4.40–5.90)
RDW: 13.7 % (ref 11.5–14.5)
WBC: 5.1 10*3/uL (ref 3.8–10.6)

## 2017-10-09 LAB — PSA: PROSTATIC SPECIFIC ANTIGEN: 0.64 ng/mL (ref 0.00–4.00)

## 2017-10-09 MED ORDER — DENOSUMAB 120 MG/1.7ML ~~LOC~~ SOLN
120.0000 mg | Freq: Once | SUBCUTANEOUS | Status: AC
  Administered 2017-10-09: 120 mg via SUBCUTANEOUS

## 2017-10-09 NOTE — Patient Instructions (Signed)

## 2017-10-15 MED FILL — ABIRATERONE ACETATE 250 MG: 250 | 30 days supply | Qty: 120 | Fill #1

## 2017-10-15 MED FILL — predniSONE 5 MG TABS: 5 | 30 days supply | Qty: 30 | Fill #1

## 2017-11-06 ENCOUNTER — Inpatient Hospital Stay: Payer: PPO

## 2017-11-06 ENCOUNTER — Inpatient Hospital Stay: Payer: PPO | Attending: Oncology

## 2017-11-06 DIAGNOSIS — C61 Malignant neoplasm of prostate: Secondary | ICD-10-CM

## 2017-11-06 DIAGNOSIS — C7951 Secondary malignant neoplasm of bone: Secondary | ICD-10-CM | POA: Diagnosis not present

## 2017-11-06 LAB — CBC WITH DIFFERENTIAL/PLATELET
BASOS ABS: 0 10*3/uL (ref 0–0.1)
Basophils Relative: 1 %
EOS ABS: 0.1 10*3/uL (ref 0–0.7)
EOS PCT: 3 %
HCT: 37.1 % — ABNORMAL LOW (ref 40.0–52.0)
Hemoglobin: 12.8 g/dL — ABNORMAL LOW (ref 13.0–18.0)
LYMPHS ABS: 2 10*3/uL (ref 1.0–3.6)
Lymphocytes Relative: 41 %
MCH: 30.6 pg (ref 26.0–34.0)
MCHC: 34.4 g/dL (ref 32.0–36.0)
MCV: 89 fL (ref 80.0–100.0)
MONO ABS: 0.4 10*3/uL (ref 0.2–1.0)
Monocytes Relative: 8 %
Neutro Abs: 2.3 10*3/uL (ref 1.4–6.5)
Neutrophils Relative %: 47 %
PLATELETS: 189 10*3/uL (ref 150–440)
RBC: 4.16 MIL/uL — AB (ref 4.40–5.90)
RDW: 13.8 % (ref 11.5–14.5)
WBC: 4.8 10*3/uL (ref 3.8–10.6)

## 2017-11-06 LAB — BASIC METABOLIC PANEL
Anion gap: 11 (ref 5–15)
BUN: 15 mg/dL (ref 8–23)
CO2: 24 mmol/L (ref 22–32)
CREATININE: 0.97 mg/dL (ref 0.61–1.24)
Calcium: 9 mg/dL (ref 8.9–10.3)
Chloride: 107 mmol/L (ref 98–111)
GFR calc Af Amer: >60 mL/min (ref >60)
GLUCOSE: 93 mg/dL (ref 70–99)
Potassium: 3.8 mmol/L (ref 3.5–5.1)
SODIUM: 142 mmol/L (ref 135–145)

## 2017-11-06 LAB — PSA: PROSTATIC SPECIFIC ANTIGEN: 0.64 ng/mL (ref 0.00–4.00)

## 2017-11-06 MED ORDER — DENOSUMAB 120 MG/1.7ML ~~LOC~~ SOLN
120.0000 mg | Freq: Once | SUBCUTANEOUS | Status: AC
  Administered 2017-11-06: 120 mg via SUBCUTANEOUS

## 2017-11-19 MED FILL — ABIRATERONE ACETATE 250 MG: 250 | 30 days supply | Qty: 120 | Fill #2

## 2017-11-19 MED FILL — predniSONE 5 MG TABS: 5 | 30 days supply | Qty: 30 | Fill #2

## 2017-11-30 NOTE — Progress Notes (Signed)
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  INTERVAL HISTORY: Patient returns to clinic today for further evaluation and continuation of Xgeva.  He continues to have back and joint pain, but otherwise feels well.  He is tolerating Zytiga without significant side effects. He has no neurologic complaints.  He denies any recent fevers or illnesses.  He has no chest pain or shortness of breath.  He denies any nausea, vomiting, constipation, or diarrhea. He has no urinary complaints.  Patient feels at his baseline offers no further specific complaints today.  REVIEW OF SYSTEMS:   Review of Systems  Constitutional: Negative.  Negative for fever, malaise/fatigue and weight loss.  Respiratory: Negative.  Negative for cough and shortness of breath.   Cardiovascular: Negative.  Negative for chest pain and leg swelling.  Gastrointestinal: Negative.  Negative for abdominal pain.  Genitourinary: Negative.  Negative for frequency and urgency.  Musculoskeletal: Positive for back pain and joint pain.  Skin: Negative.  Negative for rash.  Neurological: Negative.  Negative for sensory change, focal weakness and weakness.  Psychiatric/Behavioral: Negative.  The patient is not nervous/anxious.     As per HPI. Otherwise, a complete review of systems is negative.  PAST MEDICAL HISTORY: Past Medical History:  Diagnosis Date  . 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 CAD (coronary artery disease), native coronary artery 2014   By CT lung (04/2013)   . Cataract   . HLD (hyperlipidemia)   . HTN (hypertension)   .  Hyperglycemia   . NAFLD (nonalcoholic fatty liver disease) 08/2011   by abd [place] with increased LFTs  . OSA (obstructive sleep apnea)    did not tolerate CPAP  . Other benign neoplasm of connective and other soft tissue of unspecified site    Neurofibroma of lateral periorbital area  . Peripheral neuropathy   . Personal history of colonic adenomas 08/05/2012  . Pneumonia 01/2013   CAP LUL  . Restless legs syndrome (RLS)   . Sleep apnea    no cpap    PAST SURGICAL HISTORY: Past Surgical History:  Procedure Laterality Date  . COLONOSCOPY  07/2012   5 adenomas (tubular, TV, serrated), rec rpt 5 months [name])  . COLONOSCOPY  02/2013   residual adenomas, cecal AVM, rec rpt 1 yr [name])  . COLONOSCOPY  03/2014   no residual polyp, cecal AVM, rpt 3-4 yrs [name])  . ECTROPION REPAIR Bilateral 09/29/2017   Procedure: REPAIR OF ECTROPION, EXTENSIVE UPPER AND LOWER;  Surgeon: Karle Starch, MD;  Location: [facility];  Service: Ophthalmology;  Laterality: Bilateral;  . ORIF ANKLE FRACTURE  11/29/00   Dr. Tamala Julian  . PTOSIS REPAIR Bilateral 09/29/2017   Procedure: BLEPHAROPTOSIS REPAIR RESECT EX UPPER;  Surgeon: Karle Starch, MD;  Location: [facility];  Service: Ophthalmology;  Laterality: Bilateral;  . SKIN CANCER EXCISION  1997   left anterior neck    FAMILY HISTORY: Family History  Problem Relation Age of Onset  . Cancer Father        liver  . Alcohol abuse Father   . Liver cancer Father   . Cancer Mother  breast  . Breast cancer Mother   . Hypertension Brother        Half-brother  . Coronary artery disease Paternal Aunt        CABG  . Cancer Other        GM; Aunt - breast  . Colon cancer Neg Hx   . Rectal cancer Neg Hx   . Stomach cancer Neg Hx   . Esophageal cancer Neg Hx     ADVANCED DIRECTIVES (Y/N):  N  HEALTH MAINTENANCE: Social History   Tobacco Use  . Smoking status: Former Smoker    Types: Cigarettes, Cigars    Last attempt to quit:  04/28/2002    Years since quitting: 15.6  . Smokeless tobacco: Never Used  . Tobacco comment: rare cigar  Substance Use Topics  . Alcohol use: Not Currently    Alcohol/week: 4.0 standard drinks    Types: 2 Shots of liquor, 2 Standard drinks or equivalent per week    Comment: Weekends  . Drug use: No     Colonoscopy:  PAP:  Bone density:  Lipid panel:  No Known Allergies  Current Outpatient Medications  Medication Sig Dispense Refill  . abiraterone acetate (ZYTIGA) 250 MG tablet TAKE 4 TABLETS (1,000 MG TOTAL) BY MOUTH DAILY. TAKE ON AN EMPTY STOMACH 1 HOUR BEFORE OR 2 HOURS AFTER A MEAL 120 tablet 2  . amLODipine (NORVASC) 5 MG tablet Take 1 tablet (5 mg total) by mouth daily. 90 tablet 3  . aspirin 81 MG tablet Take 81 mg by mouth daily.    Marland Kitchen CALCIUM PO Take 1,200 mg by mouth daily.    . cholecalciferol (VITAMIN D) 1000 UNITS tablet Take 2,000 Units by mouth daily.     Marland Kitchen gabapentin (NEURONTIN) 300 MG capsule Take 1 capsule (300 mg total) by mouth at bedtime. 90 capsule 3  . HYDROcodone-acetaminophen (NORCO/VICODIN) 5-325 MG tablet Take 1 tablet by mouth daily. As needed. 60 tablet 0  . lisinopril (PRINIVIL,ZESTRIL) 5 MG tablet Take 1 tablet (5 mg total) by mouth daily. 90 tablet 3  . Multiple Vitamins-Minerals (CENTRUM SILVER 50+MEN) TABS Take by mouth daily.    . Omega-3 Fatty Acids (FISH OIL) 1000 MG CAPS Take by mouth daily.    . predniSONE (DELTASONE) 5 MG tablet TAKE 1 TABLET BY MOUTH DAILY WITH BREAKFAST. 30 tablet 5  . temazepam (RESTORIL) 30 MG capsule Take 1 capsule (30 mg total) by mouth at bedtime as needed for sleep. 30 capsule 5  . traMADol (ULTRAM) 50 MG tablet Take 1 every 4-6 hours as needed for pain not controlled by Tylenol 6 tablet 0  . erythromycin (ROMYCIN) ophthalmic ointment Use a small amount on your sutures 4 times a day for the next 2 weeks. Switch to Aquaphor ointment should allergy develop. (Patient not taking: Reported on 12/04/2017) 3.5 g 3  . traMADol  (ULTRAM) 50 MG tablet Take 1 tablet (50 mg total) by mouth 2 (two) times daily as needed. (Patient not taking: Reported on 12/04/2017) 30 tablet 5   No current facility-administered medications for this visit.     OBJECTIVE: Vitals:   12/04/17 0904  BP: (!) 167/77  Pulse: 60  Resp: 18  Temp: (!) 96.9 F (36.1 C)     Body mass index is 31.92 kg/m.    ECOG FS:0 - Asymptomatic  General: Well-developed, well-nourished, no acute distress. Eyes: Pink conjunctiva, anicteric sclera. HEENT: Normocephalic, moist mucous membranes. Lungs: Clear to auscultation bilaterally. Heart: Regular rate and rhythm. No  rubs, murmurs, or gallops. Abdomen: Soft, nontender, nondistended. No organomegaly noted, normoactive bowel sounds. Musculoskeletal: No edema, cyanosis, or clubbing. Neuro: Alert, answering all questions appropriately. Cranial nerves grossly intact. Skin: No rashes or petechiae noted. Psych: Normal affect.  LAB RESULTS:  Lab Results  Component Value Date   NA 145 12/04/2017   K 3.7 12/04/2017   CL 109 12/04/2017   CO2 26 12/04/2017   GLUCOSE 102 (H) 12/04/2017   BUN 14 12/04/2017   CREATININE 1.06 12/04/2017   CALCIUM 9.3 12/04/2017   PROT 6.9 06/16/2017   ALBUMIN 4.0 06/16/2017   AST 22 06/16/2017   ALT 27 06/16/2017   ALKPHOS 54 06/16/2017   BILITOT 0.6 06/16/2017   GFRNONAA >60 12/04/2017   GFRAA >60 12/04/2017    Lab Results  Component Value Date   WBC 4.4 12/04/2017   NEUTROABS 1.8 12/04/2017   HGB 13.4 12/04/2017   HCT 39.1 (L) 12/04/2017   MCV 89.5 12/04/2017   PLT 183 12/04/2017     STUDIES: No results found.  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   PLAN:    1. 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 Prostate biopsy results  revealed Gleason's 8 (4+4) adenocarcinoma.  His most recent bone scan on January 15, 2017 revealed improvement of his bony metastasis.  PSA remains stable at 0.64.,  Today's result is pending. Continue Zytiga and prednisone as prescribed until intolerable side effects or progression of disease.  Proceed with Xgeva today.  Patient initiated treatment on January 23, 2017.  Continue oral calcium supplementation.  Return to clinic in 1 and 2 months for laboratory work and Xgeva only and then in 3 months for further evaluation. 2. Hypocalcemia: Resolved.  Continue calcium and vitamin D supplementation. Proceed with Xgeva as above. Monitor. 3. Pain: Patient does not complain of this today.  I spent a total of 30 minutes face-to-face with the patient of which greater than 50% of the visit was spent in counseling and coordination of care as detailed above.   Patient expressed understanding and was in agreement with this plan. He also understands that He can call clinic at any time with any questions, concerns, or complaints.   Cancer Staging Primary malignant 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 [facility]) Staging form: Prostate, AJCC 8th Edition - Clinical stage from 09/26/2016: Stage IVB (cTX, cN1, cM1c, PSA: 193) - Signed by Lloyd Huger, MD on 09/26/2016   Lloyd Huger, MD   12/04/2017 10:38 AM

## 2017-12-04 ENCOUNTER — Inpatient Hospital Stay: Payer: PPO | Attending: Oncology

## 2017-12-04 ENCOUNTER — Other Ambulatory Visit: Payer: Self-pay

## 2017-12-04 ENCOUNTER — Inpatient Hospital Stay: Payer: PPO

## 2017-12-04 ENCOUNTER — Inpatient Hospital Stay (HOSPITAL_BASED_OUTPATIENT_CLINIC_OR_DEPARTMENT_OTHER): Payer: PPO | Admitting: Oncology

## 2017-12-04 ENCOUNTER — Encounter: Payer: Self-pay | Admitting: Oncology

## 2017-12-04 VITALS — BP 145/73 | HR 52

## 2017-12-04 VITALS — BP 167/77 | HR 60 | Temp 96.9°F | Resp 18 | Wt 197.8 lb

## 2017-12-04 DIAGNOSIS — I10 Essential (primary) hypertension: Secondary | ICD-10-CM | POA: Diagnosis not present

## 2017-12-04 DIAGNOSIS — C61 Malignant neoplasm of prostate: Secondary | ICD-10-CM | POA: Diagnosis not present

## 2017-12-04 DIAGNOSIS — Z7189 Other specified counseling: Secondary | ICD-10-CM

## 2017-12-04 DIAGNOSIS — C7951 Secondary malignant neoplasm of bone: Principal | ICD-10-CM

## 2017-12-04 DIAGNOSIS — Z87891 Personal history of nicotine dependence: Secondary | ICD-10-CM

## 2017-12-04 LAB — CBC WITH DIFFERENTIAL/PLATELET
Basophils Absolute: 0 10*3/uL (ref 0–0.1)
Basophils Relative: 1 %
EOS ABS: 0.2 10*3/uL (ref 0–0.7)
EOS PCT: 5 %
HCT: 39.1 % — ABNORMAL LOW (ref 40.0–52.0)
Hemoglobin: 13.4 g/dL (ref 13.0–18.0)
Lymphocytes Relative: 44 %
Lymphs Abs: 2 10*3/uL (ref 1.0–3.6)
MCH: 30.7 pg (ref 26.0–34.0)
MCHC: 34.3 g/dL (ref 32.0–36.0)
MCV: 89.5 fL (ref 80.0–100.0)
MONOS PCT: 8 %
Monocytes Absolute: 0.4 10*3/uL (ref 0.2–1.0)
Neutro Abs: 1.8 10*3/uL (ref 1.4–6.5)
Neutrophils Relative %: 42 %
PLATELETS: 183 10*3/uL (ref 150–440)
RBC: 4.38 MIL/uL — AB (ref 4.40–5.90)
RDW: 13.3 % (ref 11.5–14.5)
WBC: 4.4 10*3/uL (ref 3.8–10.6)

## 2017-12-04 LAB — BASIC METABOLIC PANEL
Anion gap: 10 (ref 5–15)
BUN: 14 mg/dL (ref 8–23)
CHLORIDE: 109 mmol/L (ref 98–111)
CO2: 26 mmol/L (ref 22–32)
CREATININE: 1.06 mg/dL (ref 0.61–1.24)
Calcium: 9.3 mg/dL (ref 8.9–10.3)
GFR calc Af Amer: >60 mL/min (ref >60)
GFR calc non Af Amer: >60 mL/min (ref >60)
Glucose, Bld: 102 mg/dL — ABNORMAL HIGH (ref 70–99)
Potassium: 3.7 mmol/L (ref 3.5–5.1)
SODIUM: 145 mmol/L (ref 135–145)

## 2017-12-04 LAB — PSA: PROSTATIC SPECIFIC ANTIGEN: 0.58 ng/mL (ref 0.00–4.00)

## 2017-12-04 MED ORDER — DENOSUMAB 120 MG/1.7ML ~~LOC~~ SOLN
120.0000 mg | Freq: Once | SUBCUTANEOUS | Status: AC
  Administered 2017-12-04: 120 mg via SUBCUTANEOUS

## 2017-12-11 ENCOUNTER — Other Ambulatory Visit: Payer: Self-pay | Admitting: Oncology

## 2017-12-11 DIAGNOSIS — C61 Malignant neoplasm of prostate: Secondary | ICD-10-CM

## 2017-12-16 MED FILL — ABIRATERONE ACETATE 250 MG: 250 | 30 days supply | Qty: 120 | Fill #0

## 2017-12-17 MED FILL — predniSONE 5 MG TABS: 5 | 30 days supply | Qty: 30 | Fill #3

## 2018-01-01 ENCOUNTER — Inpatient Hospital Stay: Payer: PPO | Attending: Oncology

## 2018-01-01 ENCOUNTER — Inpatient Hospital Stay: Payer: PPO

## 2018-01-01 VITALS — BP 159/84 | HR 54 | Temp 96.1°F | Resp 18

## 2018-01-01 DIAGNOSIS — C7951 Secondary malignant neoplasm of bone: Secondary | ICD-10-CM | POA: Insufficient documentation

## 2018-01-01 DIAGNOSIS — C61 Malignant neoplasm of prostate: Secondary | ICD-10-CM

## 2018-01-01 LAB — CBC WITH DIFFERENTIAL/PLATELET
Basophils Absolute: 0 10*3/uL (ref 0–0.1)
Basophils Relative: 1 %
EOS ABS: 0.2 10*3/uL (ref 0–0.7)
EOS PCT: 5 %
HCT: 39 % — ABNORMAL LOW (ref 40.0–52.0)
HEMOGLOBIN: 13.4 g/dL (ref 13.0–18.0)
LYMPHS ABS: 2 10*3/uL (ref 1.0–3.6)
Lymphocytes Relative: 41 %
MCH: 30.6 pg (ref 26.0–34.0)
MCHC: 34.3 g/dL (ref 32.0–36.0)
MCV: 89.1 fL (ref 80.0–100.0)
MONO ABS: 0.4 10*3/uL (ref 0.2–1.0)
MONOS PCT: 9 %
NEUTROS PCT: 44 %
Neutro Abs: 2.1 10*3/uL (ref 1.4–6.5)
Platelets: 178 10*3/uL (ref 150–440)
RBC: 4.38 MIL/uL — ABNORMAL LOW (ref 4.40–5.90)
RDW: 13.3 % (ref 11.5–14.5)
WBC: 4.8 10*3/uL (ref 3.8–10.6)

## 2018-01-01 LAB — BASIC METABOLIC PANEL
Anion gap: 11 (ref 5–15)
BUN: 10 mg/dL (ref 8–23)
CALCIUM: 9.5 mg/dL (ref 8.9–10.3)
CHLORIDE: 107 mmol/L (ref 98–111)
CO2: 26 mmol/L (ref 22–32)
CREATININE: 1.04 mg/dL (ref 0.61–1.24)
GFR calc Af Amer: >60 mL/min (ref >60)
GFR calc non Af Amer: >60 mL/min (ref >60)
Glucose, Bld: 100 mg/dL — ABNORMAL HIGH (ref 70–99)
Potassium: 3.5 mmol/L (ref 3.5–5.1)
Sodium: 144 mmol/L (ref 135–145)

## 2018-01-01 LAB — PSA: Prostatic Specific Antigen: 0.51 ng/mL (ref 0.00–4.00)

## 2018-01-01 MED ORDER — DENOSUMAB 120 MG/1.7ML ~~LOC~~ SOLN
120.0000 mg | Freq: Once | SUBCUTANEOUS | Status: AC
  Administered 2018-01-01: 120 mg via SUBCUTANEOUS

## 2018-01-01 NOTE — Patient Instructions (Signed)

## 2018-01-10 ENCOUNTER — Other Ambulatory Visit: Payer: Self-pay | Admitting: Family Medicine

## 2018-01-11 NOTE — Telephone Encounter (Signed)
Name of Medication: Temazepam Name of Pharmacy: [facility] or Written Date and Quantity: 12/14/17, #30 Last Office Visit and Type: 06/18/17, CPE Next Office Visit and Type: 07/01/18, CPE Last Controlled Substance Agreement Date: none Last UDS: none

## 2018-01-11 NOTE — Telephone Encounter (Signed)
Eprescribed.

## 2018-01-13 MED FILL — ABIRATERONE ACETATE 250 MG: 250 | 30 days supply | Qty: 120 | Fill #1

## 2018-01-14 ENCOUNTER — Encounter: Payer: Self-pay | Admitting: Family Medicine

## 2018-01-14 ENCOUNTER — Ambulatory Visit: Payer: PPO | Admitting: Family Medicine

## 2018-01-14 VITALS — BP 132/70 | HR 62 | Temp 97.9°F | Ht 66.0 in | Wt 200.2 lb

## 2018-01-14 DIAGNOSIS — Z23 Encounter for immunization: Secondary | ICD-10-CM

## 2018-01-14 DIAGNOSIS — H0259 Other disorders affecting eyelid function: Secondary | ICD-10-CM | POA: Diagnosis not present

## 2018-01-14 NOTE — Progress Notes (Signed)
BP 132/70 (BP Location: Left Arm, Patient Position: Sitting, Cuff Size: Normal)   Pulse 62   Temp 97.9 F (36.6 C) (Oral)   Ht 5\' 6"  (1.676 m)   Wt 200 lb 4 oz (90.8 kg)   SpO2 96%   BMI 32.32 kg/m   CC: L eye trouble Subjective:    Patient ID: Justin Ali, male    DOB: July 07, 1940, 77 y.o.   MRN: [id number]  HPI: Justin Ali is a 77 y.o. male presenting on 01/14/2018 for Eye Problem (C/o left eye completely closing when eye is relaxed. 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 Not satisfied with results. Unable to follow up sooner.   Persistent trouble with L eyelid closing when relaxing, feels eye stitched "too tight" where he feels intermittent foreign body sensation in eyes, notes loss of L peripheral vision due to limited eye mobility.   Requests referral for second opinion. Would like to go to [place] or [facility].   H/o remote military service related injury to L eye.   Relevant past medical, surgical, family and social history reviewed and updated as indicated. Interim medical history since our last visit reviewed. Allergies and medications reviewed and updated. Outpatient Medications Prior to Visit  Medication Sig Dispense Refill  . abiraterone acetate (ZYTIGA) 250 MG tablet TAKE 4 TABLETS (1,000 MG TOTAL) BY MOUTH DAILY. TAKE ON AN EMPTY STOMACH 1 HOUR BEFORE OR 2 HOURS AFTER A MEAL 120 tablet 2  . amLODipine (NORVASC) 5 MG tablet Take 1 tablet (5 mg total) by mouth daily. 90 tablet 3  . aspirin 81 MG tablet Take 81 mg by mouth daily.    Marland Kitchen CALCIUM PO Take 1,200 mg by mouth daily.    . cholecalciferol (VITAMIN D) 1000 UNITS tablet Take 2,000 Units by mouth daily.     Marland Kitchen erythromycin [facility]) ophthalmic ointment Use a small amount on your sutures 4 times a day for the next 2 weeks. Switch to Aquaphor  ointment should allergy develop. 3.5 g 3  . gabapentin (NEURONTIN) 300 MG capsule Take 1 capsule (300 mg total) by mouth at bedtime. 90 capsule 3  . HYDROcodone-acetaminophen (NORCO/VICODIN) 5-325 MG tablet Take 1 tablet by mouth daily. As needed. 60 tablet 0  . lisinopril (PRINIVIL,ZESTRIL) 5 MG tablet Take 1 tablet (5 mg total) by mouth daily. 90 tablet 3  . Multiple Vitamins-Minerals (CENTRUM SILVER 50+MEN) TABS Take by mouth daily.    . Omega-3 Fatty Acids (FISH OIL) 1000 MG CAPS Take by mouth daily.    . predniSONE (DELTASONE) 5 MG tablet TAKE 1 TABLET BY MOUTH DAILY WITH BREAKFAST. 30 tablet 5  . temazepam (RESTORIL) 30 MG capsule Take 1 capsule (30 mg total) by mouth at bedtime as needed for sleep. 30 capsule 0  . traMADol (ULTRAM) 50 MG tablet Take 1 tablet (50 mg total) by mouth 2 (two) times daily as needed. 30 tablet 5  . traMADol (ULTRAM) 50 MG tablet Take 1 every 4-6 hours as needed for pain not controlled by Tylenol 6 tablet 0   No facility-administered medications prior to visit.      Per HPI unless specifically indicated in ROS section below Review of Systems     Objective:    BP 132/70 (BP Location: Left Arm, Patient Position: Sitting, Cuff Size: Normal)   Pulse 62   Temp  97.9 F (36.6 C) (Oral)   Ht 5\' 6"  (1.676 m)   Wt 200 lb 4 oz (90.8 kg)   SpO2 96%   BMI 32.32 kg/m   Wt Readings from Last 3 Encounters:  01/14/18 200 lb 4 oz (90.8 kg)  12/04/17 197 lb 12 oz (89.7 kg)  09/29/17 199 lb (90.3 kg)    Physical Exam  Constitutional: He appears well-developed and well-nourished. No distress.  Eyes: Pupils are equal, round, and reactive to light. Conjunctivae and EOM are normal.  Pt remains concerned about residual tightness of L eyelid after recent eye surgery  Nursing note and vitals reviewed.     Assessment & Plan:   Problem List Items Addressed This Visit    Laxity of eyelid - Primary    Pt not satisfied with recent eye surgery and requests referral for  second opinion in [place] - will refer. Discussed there may not be may options - he still desires second opinion. Will start with new ophtho referral. Consider plastics referral.       Relevant Orders   Ambulatory referral to Ophthalmology    Other Visit Diagnoses    Need for influenza vaccination       Relevant Orders   Flu Vaccine QUAD 36+ mos IM (Completed)       No orders of the defined types were placed in this encounter.  Orders Placed This Encounter  Procedures  . Flu Vaccine QUAD 36+ mos IM  . Ambulatory referral to Ophthalmology    Referral Priority:   Routine    Referral Type:   Consultation    Referral Reason:   Specialty Services Required    Requested Specialty:   Ophthalmology    Number of Visits Requested:   1    Follow up plan: No follow-ups on file.  Ria Bush, MD

## 2018-01-14 NOTE — Patient Instructions (Signed)
Flu shot today We will refer you for second opinion.

## 2018-01-16 DIAGNOSIS — H0259 Other disorders affecting eyelid function: Secondary | ICD-10-CM | POA: Insufficient documentation

## 2018-01-16 NOTE — Assessment & Plan Note (Addendum)
Pt not satisfied with recent eye surgery and requests referral for second opinion in [place] - will refer. Discussed there may not be may options - he still desires second opinion. Will start with new ophtho referral. Consider plastics referral.

## 2018-01-29 ENCOUNTER — Inpatient Hospital Stay: Payer: PPO

## 2018-01-29 ENCOUNTER — Inpatient Hospital Stay: Payer: PPO | Attending: Oncology

## 2018-01-29 ENCOUNTER — Other Ambulatory Visit: Payer: Self-pay | Admitting: Oncology

## 2018-01-29 VITALS — BP 151/74 | HR 64 | Temp 97.3°F | Resp 20

## 2018-01-29 DIAGNOSIS — C7951 Secondary malignant neoplasm of bone: Principal | ICD-10-CM

## 2018-01-29 DIAGNOSIS — C772 Secondary and unspecified malignant neoplasm of intra-abdominal lymph nodes: Secondary | ICD-10-CM | POA: Diagnosis not present

## 2018-01-29 DIAGNOSIS — C61 Malignant neoplasm of prostate: Secondary | ICD-10-CM

## 2018-01-29 LAB — BASIC METABOLIC PANEL
ANION GAP: 8 (ref 5–15)
BUN: 14 mg/dL (ref 8–23)
CHLORIDE: 110 mmol/L (ref 98–111)
CO2: 28 mmol/L (ref 22–32)
Calcium: 9.3 mg/dL (ref 8.9–10.3)
Creatinine, Ser: 1 mg/dL (ref 0.61–1.24)
GFR calc Af Amer: >60 mL/min (ref >60)
GLUCOSE: 112 mg/dL — AB (ref 70–99)
POTASSIUM: 3.2 mmol/L — AB (ref 3.5–5.1)
Sodium: 146 mmol/L — ABNORMAL HIGH (ref 135–145)

## 2018-01-29 LAB — CBC WITH DIFFERENTIAL/PLATELET
Basophils Absolute: 0.1 10*3/uL (ref 0–0.1)
Basophils Relative: 1 %
EOS PCT: 4 %
Eosinophils Absolute: 0.2 10*3/uL (ref 0–0.7)
HCT: 39.3 % — ABNORMAL LOW (ref 40.0–52.0)
Hemoglobin: 13.6 g/dL (ref 13.0–18.0)
LYMPHS ABS: 1.9 10*3/uL (ref 1.0–3.6)
LYMPHS PCT: 37 %
MCH: 30.7 pg (ref 26.0–34.0)
MCHC: 34.5 g/dL (ref 32.0–36.0)
MCV: 88.8 fL (ref 80.0–100.0)
MONO ABS: 0.4 10*3/uL (ref 0.2–1.0)
Monocytes Relative: 8 %
Neutro Abs: 2.6 10*3/uL (ref 1.4–6.5)
Neutrophils Relative %: 50 %
PLATELETS: 167 10*3/uL (ref 150–440)
RBC: 4.42 MIL/uL (ref 4.40–5.90)
RDW: 13.7 % (ref 11.5–14.5)
WBC: 5.1 10*3/uL (ref 3.8–10.6)

## 2018-01-29 LAB — PSA: Prostatic Specific Antigen: 0.63 ng/mL (ref 0.00–4.00)

## 2018-01-29 MED ORDER — DENOSUMAB 120 MG/1.7ML ~~LOC~~ SOLN
120.0000 mg | Freq: Once | SUBCUTANEOUS | Status: AC
  Administered 2018-01-29: 120 mg via SUBCUTANEOUS

## 2018-01-29 MED ORDER — ABIRATERONE ACETATE 250 MG PO TABS: 1000.0000 mg | ORAL_TABLET | Freq: Every day | ORAL | 5 refills | Status: DC

## 2018-01-29 NOTE — Progress Notes (Signed)
Oral Chemotherapy Pharmacist Encounter   Patient would like to try and fill his Zytiga through the [place]. He requested that we send over a prescription and additional information via fax to the [facility] (fax: [phone]).  Called Mr. Wieck to let him know this was completed. He stated his appreciation. [facility].   Darl Pikes, PharmD, BCPS, [facility] Hematology/Oncology Clinical Pharmacist [facility] [phone]  01/29/2018 3:17 PM

## 2018-02-08 ENCOUNTER — Other Ambulatory Visit: Payer: Self-pay | Admitting: Family Medicine

## 2018-02-08 NOTE — Telephone Encounter (Signed)
Electronic refill request Temazepam Last office visit 01/14/18 Last refill 9//16/19 #30

## 2018-02-09 ENCOUNTER — Telehealth: Payer: Self-pay

## 2018-02-09 NOTE — Telephone Encounter (Signed)
Spoke with patient, he has appointment with Dr Lorina Rabon on 02/16/18 and per his insurance PCP needs to contact insurance to get this authorized since it is out of network provider. I called [employer] and was routed to 6 different departments and given 5 different numbers. I was advised to fill out PA form for this visit and send it. Unable to do this prior to the appointment because per representative at Dr Linton Rump office they do not know what CPT codes will be used until patient is seen. Per PA form able to do retro authorization request and will have to wait till patient is seen. Patient is Justin Ali, RMA

## 2018-02-11 MED FILL — ABIRATERONE ACETATE 250 MG: 250 | 30 days supply | Qty: 120 | Fill #2

## 2018-02-11 NOTE — Telephone Encounter (Signed)
Eprescribed.

## 2018-02-16 DIAGNOSIS — H02831 Dermatochalasis of right upper eyelid: Secondary | ICD-10-CM | POA: Diagnosis not present

## 2018-02-16 DIAGNOSIS — H02413 Mechanical ptosis of bilateral eyelids: Secondary | ICD-10-CM | POA: Diagnosis not present

## 2018-02-16 DIAGNOSIS — H0289 Other specified disorders of eyelid: Secondary | ICD-10-CM | POA: Diagnosis not present

## 2018-02-16 DIAGNOSIS — H02834 Dermatochalasis of left upper eyelid: Secondary | ICD-10-CM | POA: Diagnosis not present

## 2018-02-16 DIAGNOSIS — H0279 Other degenerative disorders of eyelid and periocular area: Secondary | ICD-10-CM | POA: Diagnosis not present

## 2018-02-16 DIAGNOSIS — H53483 Generalized contraction of visual field, bilateral: Secondary | ICD-10-CM | POA: Diagnosis not present

## 2018-02-18 NOTE — Telephone Encounter (Signed)
Called Dr Linton Rump office and got CPT codes ,ICD 10 codes, TAX ID and NPI needed for the PA form. Faxed it to [employer] to [phone]. Waiting for approval.-Anastasiya V Hopkins, RMA

## 2018-02-22 NOTE — Progress Notes (Signed)
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  INTERVAL HISTORY: Patient returns to clinic today for further evaluation and continuation of monthly Xgeva.  He continues to tolerate Zytiga and prednisone well without significant side effects.  He does not complain of back pain today.  He currently feels well and is asymptomatic. He has no neurologic complaints.  He denies any recent fevers or illnesses.  He has no chest pain or shortness of breath.  He denies any nausea, vomiting, constipation, or diarrhea. He has no urinary complaints.  Patient feels at his baseline offers no specific complaints today.  REVIEW OF SYSTEMS:   Review of Systems  Constitutional: Negative.  Negative for fever, malaise/fatigue and weight loss.  Respiratory: Negative.  Negative for cough and shortness of breath.   Cardiovascular: Negative.  Negative for chest pain and leg swelling.  Gastrointestinal: Negative.  Negative for abdominal pain.  Genitourinary: Negative.  Negative for frequency and urgency.  Musculoskeletal: Negative.  Negative for back pain and joint pain.  Skin: Negative.  Negative for rash.  Neurological: Negative.  Negative for sensory change, focal weakness and weakness.  Psychiatric/Behavioral: Negative.  The patient is not nervous/anxious.     As per HPI. Otherwise, a complete review of systems is negative.  PAST MEDICAL HISTORY: Past Medical History:  Diagnosis Date  . 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 CAD (coronary artery disease), native coronary artery 2014   By CT lung (04/2013)   .  Cataract   . HLD (hyperlipidemia)   . HTN (hypertension)   . Hyperglycemia   . NAFLD (nonalcoholic fatty liver disease) 08/2011   by abd [place] with increased LFTs  . OSA (obstructive sleep apnea)    did not tolerate CPAP  . Other benign neoplasm of connective and other soft tissue of unspecified site    Neurofibroma of lateral periorbital area  . Peripheral neuropathy   . Personal history of colonic adenomas 08/05/2012  . Pneumonia 01/2013   CAP LUL  . Restless legs syndrome (RLS)   . Sleep apnea    no cpap    PAST SURGICAL HISTORY: Past Surgical History:  Procedure Laterality Date  . COLONOSCOPY  07/2012   5 adenomas (tubular, TV, serrated), rec rpt 5 months [name])  . COLONOSCOPY  02/2013   residual adenomas, cecal AVM, rec rpt 1 yr [name])  . COLONOSCOPY  03/2014   no residual polyp, cecal AVM, rpt 3-4 yrs [name])  . ECTROPION REPAIR Bilateral 09/29/2017   Procedure: REPAIR OF ECTROPION, EXTENSIVE UPPER AND LOWER;  Surgeon: Karle Starch, MD;  Location: [facility];  Service: Ophthalmology;  Laterality: Bilateral;  . ORIF ANKLE FRACTURE  11/29/00   Dr. Tamala Julian  . PTOSIS REPAIR Bilateral 09/29/2017   Procedure: BLEPHAROPTOSIS REPAIR RESECT EX UPPER;  Surgeon: Karle Starch, MD;  Location: [place] [facility];  Service: Ophthalmology;  Laterality: Bilateral;  . SKIN CANCER EXCISION  1997   left anterior neck    FAMILY HISTORY: Family History  Problem Relation Age of Onset  . Cancer Father        liver  . Alcohol abuse Father   . Liver cancer Father   .  Cancer Mother        breast  . Breast cancer Mother   . Hypertension Brother        Half-brother  . Coronary artery disease Paternal Aunt        CABG  . Cancer Other        GM; Aunt - breast  . Colon cancer Neg Hx   . Rectal cancer Neg Hx   . Stomach cancer Neg Hx   . Esophageal cancer Neg Hx     ADVANCED DIRECTIVES (Y/N):  N  HEALTH MAINTENANCE: Social History   Tobacco Use  . Smoking status: Former  Smoker    Types: Cigarettes, Cigars    Last attempt to quit: 04/28/2002    Years since quitting: 15.8  . Smokeless tobacco: Never Used  . Tobacco comment: rare cigar  Substance Use Topics  . Alcohol use: Not Currently    Alcohol/week: 4.0 standard drinks    Types: 2 Shots of liquor, 2 Standard drinks or equivalent per week    Comment: Weekends  . Drug use: No     Colonoscopy:  PAP:  Bone density:  Lipid panel:  No Known Allergies  Current Outpatient Medications  Medication Sig Dispense Refill  . abiraterone acetate (ZYTIGA) 250 MG tablet Take 4 tablets (1,000 mg total) by mouth daily. Take on an empty stomach 1 hour before or 2 hours after a meal 120 tablet 5  . amLODipine (NORVASC) 5 MG tablet Take 1 tablet (5 mg total) by mouth daily. 90 tablet 3  . aspirin 81 MG tablet Take 81 mg by mouth daily.    Marland Kitchen CALCIUM PO Take 1,200 mg by mouth daily.    . cholecalciferol (VITAMIN D) 1000 UNITS tablet Take 2,000 Units by mouth daily.     Marland Kitchen erythromycin [facility]) ophthalmic ointment Use a small amount on your sutures 4 times a day for the next 2 weeks. Switch to Aquaphor ointment should allergy develop. 3.5 g 3  . gabapentin (NEURONTIN) 300 MG capsule Take 1 capsule (300 mg total) by mouth at bedtime. 90 capsule 3  . HYDROcodone-acetaminophen (NORCO/VICODIN) 5-325 MG tablet Take 1 tablet by mouth daily. As needed. 60 tablet 0  . lisinopril (PRINIVIL,ZESTRIL) 5 MG tablet Take 1 tablet (5 mg total) by mouth daily. 90 tablet 3  . Multiple Vitamins-Minerals (CENTRUM SILVER 50+MEN) TABS Take by mouth daily.    . Omega-3 Fatty Acids (FISH OIL) 1000 MG CAPS Take by mouth daily.    . predniSONE (DELTASONE) 5 MG tablet TAKE 1 TABLET BY MOUTH DAILY WITH BREAKFAST. 30 tablet 5  . temazepam (RESTORIL) 30 MG capsule Take 1 capsule (30 mg total) by mouth at bedtime as needed for sleep. 30 capsule 0  . traMADol (ULTRAM) 50 MG tablet Take 1 tablet (50 mg total) by mouth 2 (two) times daily as needed. 30  tablet 5  . traMADol (ULTRAM) 50 MG tablet Take 1 every 4-6 hours as needed for pain not controlled by Tylenol 6 tablet 0   No current facility-administered medications for this visit.     OBJECTIVE: Vitals:   02/26/18 0902  BP: (!) 159/76  Pulse: (!) 57  Resp: 20  Temp: 97.7 F (36.5 C)     Body mass index is 32.74 kg/m.    ECOG FS:0 - Asymptomatic  General: Well-developed, well-nourished, no acute distress. Eyes: Pink conjunctiva, anicteric sclera. HEENT: Normocephalic, moist mucous membranes. Lungs: Clear to auscultation bilaterally. Heart: Regular rate and rhythm. No rubs, murmurs, or  gallops. Abdomen: Soft, nontender, nondistended. No organomegaly noted, normoactive bowel sounds. Musculoskeletal: No edema, cyanosis, or clubbing. Neuro: Alert, answering all questions appropriately. Cranial nerves grossly intact. Skin: No rashes or petechiae noted. Psych: Normal affect.  LAB RESULTS:  Lab Results  Component Value Date   NA 138 02/26/2018   K 3.4 (L) 02/26/2018   CL 103 02/26/2018   CO2 29 02/26/2018   GLUCOSE 86 02/26/2018   BUN 16 02/26/2018   CREATININE 0.97 02/26/2018   CALCIUM 8.8 (L) 02/26/2018   PROT 6.9 06/16/2017   ALBUMIN 4.0 06/16/2017   AST 22 06/16/2017   ALT 27 06/16/2017   ALKPHOS 54 06/16/2017   BILITOT 0.6 06/16/2017   GFRNONAA >60 02/26/2018   GFRAA >60 02/26/2018    Lab Results  Component Value Date   WBC 4.7 02/26/2018   NEUTROABS 2.2 02/26/2018   HGB 13.1 02/26/2018   HCT 37.8 (L) 02/26/2018   MCV 87.1 02/26/2018   PLT 173 02/26/2018     STUDIES: No results found.  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   PLAN:    1. 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 Prostate biopsy results revealed Gleason's 8  (4+4) adenocarcinoma.  His most recent bone scan on January 15, 2017 revealed improvement of his bony metastasis.  Patient's PSA seems to have plateaued and remains stable at 0.63.  Continue Zytiga and prednisone as prescribed until intolerable side effects or progression of disease.  Proceed with Xgeva today.  Patient is now received greater than 12 months of monthly Zytiga and can transition to treatment every 3 months.  Return to clinic in 3 months for further evaluation and continuation of Xgeva.   2. Hypocalcemia: Mild.  Continue calcium and vitamin D supplementation. Proceed with Xgeva as above. Monitor. 3. Pain: Patient does not complain of this today. 4.  Hypokalemia: Mild.  Recommended dietary changes.   Patient expressed understanding and was in agreement with this plan. He also understands that He can call clinic at any time with any questions, concerns, or complaints.   Cancer Staging Primary malignant 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 [facility]) Staging form: Prostate, AJCC 8th Edition - Clinical stage from 09/26/2016: Stage IVB (cTX, cN1, cM1c, PSA: 193) - Signed by Lloyd Huger, MD on 09/26/2016   Lloyd Huger, MD   02/27/2018 8:29 AM

## 2018-02-26 ENCOUNTER — Encounter: Payer: Self-pay | Admitting: Oncology

## 2018-02-26 ENCOUNTER — Inpatient Hospital Stay: Payer: No Typology Code available for payment source | Attending: Oncology | Admitting: Oncology

## 2018-02-26 ENCOUNTER — Encounter: Payer: Self-pay | Admitting: *Deleted

## 2018-02-26 ENCOUNTER — Inpatient Hospital Stay: Payer: No Typology Code available for payment source

## 2018-02-26 VITALS — BP 159/76 | HR 57 | Temp 97.7°F | Resp 20 | Wt 202.8 lb

## 2018-02-26 DIAGNOSIS — C7951 Secondary malignant neoplasm of bone: Secondary | ICD-10-CM | POA: Diagnosis not present

## 2018-02-26 DIAGNOSIS — C61 Malignant neoplasm of prostate: Secondary | ICD-10-CM

## 2018-02-26 DIAGNOSIS — Z79899 Other long term (current) drug therapy: Secondary | ICD-10-CM | POA: Diagnosis not present

## 2018-02-26 DIAGNOSIS — C772 Secondary and unspecified malignant neoplasm of intra-abdominal lymph nodes: Secondary | ICD-10-CM | POA: Diagnosis not present

## 2018-02-26 DIAGNOSIS — Z87891 Personal history of nicotine dependence: Secondary | ICD-10-CM | POA: Diagnosis not present

## 2018-02-26 LAB — CBC WITH DIFFERENTIAL/PLATELET
ABS IMMATURE GRANULOCYTES: 0.01 10*3/uL (ref 0.00–0.07)
BASOS PCT: 0 %
Basophils Absolute: 0 10*3/uL (ref 0.0–0.1)
Eosinophils Absolute: 0.2 10*3/uL (ref 0.0–0.5)
Eosinophils Relative: 3 %
HCT: 37.8 % — ABNORMAL LOW (ref 39.0–52.0)
Hemoglobin: 13.1 g/dL (ref 13.0–17.0)
Immature Granulocytes: 0 %
Lymphocytes Relative: 40 %
Lymphs Abs: 1.9 10*3/uL (ref 0.7–4.0)
MCH: 30.2 pg (ref 26.0–34.0)
MCHC: 34.7 g/dL (ref 30.0–36.0)
MCV: 87.1 fL (ref 80.0–100.0)
MONO ABS: 0.4 10*3/uL (ref 0.1–1.0)
Monocytes Relative: 8 %
NEUTROS ABS: 2.2 10*3/uL (ref 1.7–7.7)
Neutrophils Relative %: 49 %
PLATELETS: 173 10*3/uL (ref 150–400)
RBC: 4.34 MIL/uL (ref 4.22–5.81)
RDW: 13.2 % (ref 11.5–15.5)
WBC: 4.7 10*3/uL (ref 4.0–10.5)
nRBC: 0 % (ref 0.0–0.2)

## 2018-02-26 LAB — BASIC METABOLIC PANEL
ANION GAP: 6 (ref 5–15)
BUN: 16 mg/dL (ref 8–23)
CO2: 29 mmol/L (ref 22–32)
Calcium: 8.8 mg/dL — ABNORMAL LOW (ref 8.9–10.3)
Chloride: 103 mmol/L (ref 98–111)
Creatinine, Ser: 0.97 mg/dL (ref 0.61–1.24)
GFR calc Af Amer: >60 mL/min (ref >60)
Glucose, Bld: 86 mg/dL (ref 70–99)
POTASSIUM: 3.4 mmol/L — AB (ref 3.5–5.1)
SODIUM: 138 mmol/L (ref 135–145)

## 2018-02-26 LAB — PSA: PROSTATIC SPECIFIC ANTIGEN: 0.63 ng/mL (ref 0.00–4.00)

## 2018-02-26 MED ORDER — DENOSUMAB 120 MG/1.7ML ~~LOC~~ SOLN
120.0000 mg | Freq: Once | SUBCUTANEOUS | Status: AC
  Administered 2018-02-26: 120 mg via SUBCUTANEOUS

## 2018-02-26 MED ORDER — ABIRATERONE ACETATE 250 MG PO TABS: 1000.0000 mg | ORAL_TABLET | Freq: Every day | ORAL | 5 refills | Status: DC

## 2018-02-26 NOTE — Patient Instructions (Signed)
Patients Zytiga is now going to be covered by [facility] and prescriptions are to be faxed there monthly. Phone # [phone] ext [phone]; Fax # [phone].

## 2018-02-26 NOTE — Patient Instructions (Signed)

## 2018-02-26 NOTE — Progress Notes (Signed)
Patient denies any concerns today.  

## 2018-03-08 DIAGNOSIS — H02135 Senile ectropion of left lower eyelid: Secondary | ICD-10-CM | POA: Diagnosis not present

## 2018-03-08 DIAGNOSIS — H02834 Dermatochalasis of left upper eyelid: Secondary | ICD-10-CM | POA: Diagnosis not present

## 2018-03-08 DIAGNOSIS — H0279 Other degenerative disorders of eyelid and periocular area: Secondary | ICD-10-CM | POA: Diagnosis not present

## 2018-03-08 DIAGNOSIS — H02831 Dermatochalasis of right upper eyelid: Secondary | ICD-10-CM | POA: Diagnosis not present

## 2018-03-08 DIAGNOSIS — H53483 Generalized contraction of visual field, bilateral: Secondary | ICD-10-CM | POA: Diagnosis not present

## 2018-03-08 DIAGNOSIS — H02132 Senile ectropion of right lower eyelid: Secondary | ICD-10-CM | POA: Diagnosis not present

## 2018-03-08 DIAGNOSIS — H0289 Other specified disorders of eyelid: Secondary | ICD-10-CM | POA: Diagnosis not present

## 2018-03-08 DIAGNOSIS — H02413 Mechanical ptosis of bilateral eyelids: Secondary | ICD-10-CM | POA: Diagnosis not present

## 2018-03-08 DIAGNOSIS — H5069 Other mechanical strabismus: Secondary | ICD-10-CM | POA: Diagnosis not present

## 2018-03-12 ENCOUNTER — Other Ambulatory Visit: Payer: Self-pay | Admitting: Family Medicine

## 2018-03-12 NOTE — Telephone Encounter (Signed)
Name of Medication: Temazepam Name of Pharmacy: [place] or Written Date and Quantity: 02/11/18, #30/0 Last Office Visit and Type: 01/14/18, acute Next Office Visit and Type: 07/01/18, CPE Last Controlled Substance Agreement Date: none Last UDS: none

## 2018-03-12 NOTE — Telephone Encounter (Signed)
Eprescribed.

## 2018-04-08 ENCOUNTER — Other Ambulatory Visit: Payer: Self-pay | Admitting: Family Medicine

## 2018-04-08 NOTE — Telephone Encounter (Signed)
Eprescribed.

## 2018-04-08 NOTE — Telephone Encounter (Signed)
Name of Medication: Temazepam Name of Pharmacy: [place] or Written Date and Quantity: 03/12/18, #30/0 Last Office Visit and Type: 06/18/17, CPE Next Office Visit and Type: 07/01/18, CPE Last Controlled Substance Agreement Date: none Last UDS: none

## 2018-05-04 DIAGNOSIS — H0289 Other specified disorders of eyelid: Secondary | ICD-10-CM | POA: Diagnosis not present

## 2018-05-10 ENCOUNTER — Other Ambulatory Visit: Payer: Self-pay | Admitting: Family Medicine

## 2018-05-10 NOTE — Telephone Encounter (Signed)
Name of Medication: Temazepam Name of Pharmacy: [place] or Written Date and Quantity: 04/09/18, #30/0 Last Office Visit and Type: 01/14/18, acute Next Office Visit and Type: 07/01/18, CPE Last Controlled Substance Agreement Date: none Last UDS: none

## 2018-05-12 ENCOUNTER — Other Ambulatory Visit: Payer: Self-pay | Admitting: Family Medicine

## 2018-05-12 NOTE — Telephone Encounter (Signed)
Eprescribed.

## 2018-05-12 NOTE — Telephone Encounter (Signed)
Name of Medication: Temazepam Name of Pharmacy: [place] or Written Date and Quantity: 04/09/18, #30/0 Last Office Visit and Type: 01/14/18, acute Next Office Visit and Type: 07/01/18, CPE Last Controlled Substance Agreement Date: none Last UDS: none

## 2018-05-17 NOTE — Telephone Encounter (Signed)
Eprescribed.

## 2018-05-23 NOTE — Progress Notes (Signed)
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  INTERVAL HISTORY: Patient returns to clinic today for further evaluation and continuation of Xgeva.  He is having surgery on his eye next week, but otherwise feels well is asymptomatic.  He continues to tolerate Zytiga and prednisone without significant side effects.  He has no neurologic complaints.  He denies any recent fevers or illnesses.  He has no chest pain or shortness of breath.  He denies any nausea, vomiting, constipation, or diarrhea. He has no urinary complaints.  Patient offers no further specific complaints today.  REVIEW OF SYSTEMS:   Review of Systems  Constitutional: Negative.  Negative for fever, malaise/fatigue and weight loss.  Eyes: Positive for blurred vision.  Respiratory: Negative.  Negative for cough and shortness of breath.   Cardiovascular: Negative.  Negative for chest pain and leg swelling.  Gastrointestinal: Negative.  Negative for abdominal pain.  Genitourinary: Negative.  Negative for frequency and urgency.  Musculoskeletal: Positive for back pain. Negative for joint pain.  Skin: Negative.  Negative for rash.  Neurological: Negative.  Negative for sensory change, focal weakness and weakness.  Psychiatric/Behavioral: Negative.  The patient is not nervous/anxious.     As per HPI. Otherwise, a complete review of systems is negative.  PAST MEDICAL HISTORY: Past Medical History:  Diagnosis Date  . 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 CAD (coronary artery disease), native coronary artery 2014   By CT lung (04/2013)   .  Cataract   . HLD (hyperlipidemia)   . HTN (hypertension)   . Hyperglycemia   . NAFLD (nonalcoholic fatty liver disease) 08/2011   by abd [place] with increased LFTs  . OSA (obstructive sleep apnea)    did not tolerate CPAP  . Other benign neoplasm of connective and other soft tissue of unspecified site    Neurofibroma of lateral periorbital area  . Peripheral neuropathy   . Personal history of colonic adenomas 08/05/2012  . Pneumonia 01/2013   CAP LUL  . Restless legs syndrome (RLS)   . Sleep apnea    no cpap    PAST SURGICAL HISTORY: Past Surgical History:  Procedure Laterality Date  . COLONOSCOPY  07/2012   5 adenomas (tubular, TV, serrated), rec rpt 5 months [name])  . COLONOSCOPY  02/2013   residual adenomas, cecal AVM, rec rpt 1 yr [name])  . COLONOSCOPY  03/2014   no residual polyp, cecal AVM, rpt 3-4 yrs [name])  . ECTROPION REPAIR Bilateral 09/29/2017   Procedure: REPAIR OF ECTROPION, EXTENSIVE UPPER AND LOWER;  Surgeon: Karle Starch, MD;  Location: [place];  Service: Ophthalmology;  Laterality: Bilateral;  . ORIF ANKLE FRACTURE  11/29/00   Dr. Tamala Julian  . PTOSIS REPAIR Bilateral 09/29/2017   Procedure: BLEPHAROPTOSIS REPAIR RESECT EX UPPER;  Surgeon: Karle Starch, MD;  Location: [place];  Service: Ophthalmology;  Laterality: Bilateral;  . SKIN CANCER EXCISION  1997   left anterior neck    FAMILY HISTORY: Family History  Problem Relation Age of Onset  . Cancer Father        liver  . Alcohol abuse Father   . Liver cancer Father   .  Cancer Mother        breast  . Breast cancer Mother   . Hypertension Brother        Half-brother  . Coronary artery disease Paternal Aunt        CABG  . Cancer Other        GM; Aunt - breast  . Colon cancer Neg Hx   . Rectal cancer Neg Hx   . Stomach cancer Neg Hx   . Esophageal cancer Neg Hx     ADVANCED DIRECTIVES (Y/N):  N  HEALTH MAINTENANCE: Social History   Tobacco Use  . Smoking status: Former  Smoker    Types: Cigarettes, Cigars    Last attempt to quit: 04/28/2002    Years since quitting: 16.0  . Smokeless tobacco: Never Used  . Tobacco comment: rare cigar  Substance Use Topics  . Alcohol use: Not Currently    Alcohol/week: 4.0 standard drinks    Types: 2 Shots of liquor, 2 Standard drinks or equivalent per week    Comment: Weekends  . Drug use: No     Colonoscopy:  PAP:  Bone density:  Lipid panel:  No Known Allergies  Current Outpatient Medications  Medication Sig Dispense Refill  . abiraterone acetate (ZYTIGA) 250 MG tablet Take 4 tablets (1,000 mg total) by mouth daily. Take on an empty stomach 1 hour before or 2 hours after a meal 120 tablet 5  . amLODipine (NORVASC) 5 MG tablet Take 1 tablet (5 mg total) by mouth daily. 90 tablet 3  . aspirin 81 MG tablet Take 81 mg by mouth daily.    Marland Kitchen CALCIUM PO Take 1,200 mg by mouth daily.    . cholecalciferol (VITAMIN D) 1000 UNITS tablet Take 2,000 Units by mouth daily.     Marland Kitchen erythromycin [facility]) ophthalmic ointment Use a small amount on your sutures 4 times a day for the next 2 weeks. Switch to Aquaphor ointment should allergy develop. 3.5 g 3  . gabapentin (NEURONTIN) 300 MG capsule Take 1 capsule (300 mg total) by mouth at bedtime. 90 capsule 3  . HYDROcodone-acetaminophen (NORCO/VICODIN) 5-325 MG tablet Take 1 tablet by mouth daily. As needed. 60 tablet 0  . lisinopril (PRINIVIL,ZESTRIL) 5 MG tablet Take 1 tablet (5 mg total) by mouth daily. 90 tablet 3  . Multiple Vitamins-Minerals (CENTRUM SILVER 50+MEN) TABS Take by mouth daily.    . Omega-3 Fatty Acids (FISH OIL) 1000 MG CAPS Take by mouth daily.    . predniSONE (DELTASONE) 5 MG tablet TAKE 1 TABLET BY MOUTH DAILY WITH BREAKFAST. 30 tablet 5  . temazepam (RESTORIL) 30 MG capsule Take 1 capsule (30 mg total) by mouth at bedtime as needed for sleep. 30 capsule 0  . traMADol (ULTRAM) 50 MG tablet Take 1 tablet (50 mg total) by mouth 2 (two) times daily as needed. 30  tablet 5  . traMADol (ULTRAM) 50 MG tablet Take 1 every 4-6 hours as needed for pain not controlled by Tylenol 6 tablet 0   No current facility-administered medications for this visit.    Facility-Administered Medications Ordered in Other Visits  Medication Dose Route Frequency Provider Last Rate Last Dose  . denosumab (XGEVA) injection 120 mg  120 mg Subcutaneous Once Lloyd Huger, MD        OBJECTIVE: Vitals:   05/28/18 0854  BP: (!) 152/79  Pulse: 78  Resp: 18  Temp: (!) 97.4 F (36.3 C)     Body mass index is 33.73 kg/m.  ECOG FS:0 - Asymptomatic  General: Well-developed, well-nourished, no acute distress. Eyes: Pink conjunctiva, anicteric sclera. HEENT: Normocephalic, moist mucous membranes. Lungs: Clear to auscultation bilaterally. Heart: Regular rate and rhythm. No rubs, murmurs, or gallops. Abdomen: Soft, nontender, nondistended. No organomegaly noted, normoactive bowel sounds. Musculoskeletal: No edema, cyanosis, or clubbing. Neuro: Alert, answering all questions appropriately. Cranial nerves grossly intact. Skin: No rashes or petechiae noted. Psych: Normal affect.  LAB RESULTS:  Lab Results  Component Value Date   NA 141 05/28/2018   K 3.2 (L) 05/28/2018   CL 107 05/28/2018   CO2 26 05/28/2018   GLUCOSE 124 (H) 05/28/2018   BUN 9 05/28/2018   CREATININE 1.04 05/28/2018   CALCIUM 9.0 05/28/2018   PROT 6.9 06/16/2017   ALBUMIN 4.0 06/16/2017   AST 22 06/16/2017   ALT 27 06/16/2017   ALKPHOS 54 06/16/2017   BILITOT 0.6 06/16/2017   GFRNONAA >60 05/28/2018   GFRAA >60 05/28/2018    Lab Results  Component Value Date   WBC 4.7 05/28/2018   NEUTROABS 2.1 05/28/2018   HGB 13.1 05/28/2018   HCT 37.5 (L) 05/28/2018   MCV 88.9 05/28/2018   PLT 182 05/28/2018     STUDIES: No results found.  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   PLAN:    1. Stage IV prostate cancer with  bulky abdominal lymphadenopathy and widespread bony metastasis: Initial CT and bone scan results on Sep 19, 2016 reviewed independently with widespread metastatic disease. Prostate biopsy results revealed Gleason's 8 (4+4) adenocarcinoma.  His most recent bone scan on January 15, 2017 revealed improvement of his bony metastasis.  Patient's most recent PSA has plateaued at 0.63.  Today's result is pending.  Continue Zytiga and prednisone as prescribed until intolerable side effects for definitive progression of disease.  Proceed with Xgeva today.  Return to clinic in 3 months for further evaluation and continuation of treatment.  2. Hypocalcemia: Resolved.  Continue calcium and vitamin  D supplementation. 3. Pain: Patient does not complain of this today. 4.  Hypokalemia: Mild.  Patient was given a prescription for 20 mEq potassium chloride today. 5.  Eye surgery: Proceed with surgery as planned next week.  Patient expressed understanding and was in agreement with this plan. He also understands that He can call clinic at any time with any questions, concerns, or complaints.   Cancer Staging Primary malignant 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 [facility]) Staging form: Prostate, AJCC 8th Edition - Clinical stage from 09/26/2016: Stage IVB (cTX, cN1, cM1c, PSA: 193) - Signed by Lloyd Huger, MD on 09/26/2016   Lloyd Huger, MD   05/28/2018 10:36 AM

## 2018-05-28 ENCOUNTER — Other Ambulatory Visit: Payer: Self-pay

## 2018-05-28 ENCOUNTER — Other Ambulatory Visit: Payer: Self-pay | Admitting: *Deleted

## 2018-05-28 ENCOUNTER — Inpatient Hospital Stay (HOSPITAL_BASED_OUTPATIENT_CLINIC_OR_DEPARTMENT_OTHER): Payer: No Typology Code available for payment source | Admitting: Oncology

## 2018-05-28 ENCOUNTER — Encounter: Payer: Self-pay | Admitting: Oncology

## 2018-05-28 ENCOUNTER — Inpatient Hospital Stay: Payer: No Typology Code available for payment source

## 2018-05-28 ENCOUNTER — Inpatient Hospital Stay: Payer: No Typology Code available for payment source | Attending: Oncology

## 2018-05-28 VITALS — BP 152/79 | HR 78 | Temp 97.4°F | Resp 18 | Wt 209.0 lb

## 2018-05-28 DIAGNOSIS — Z87891 Personal history of nicotine dependence: Secondary | ICD-10-CM

## 2018-05-28 DIAGNOSIS — C7951 Secondary malignant neoplasm of bone: Secondary | ICD-10-CM | POA: Diagnosis not present

## 2018-05-28 DIAGNOSIS — E876 Hypokalemia: Secondary | ICD-10-CM | POA: Diagnosis not present

## 2018-05-28 DIAGNOSIS — C61 Malignant neoplasm of prostate: Secondary | ICD-10-CM

## 2018-05-28 DIAGNOSIS — I10 Essential (primary) hypertension: Secondary | ICD-10-CM | POA: Diagnosis not present

## 2018-05-28 LAB — CBC WITH DIFFERENTIAL/PLATELET
Abs Immature Granulocytes: 0.02 10*3/uL (ref 0.00–0.07)
Basophils Absolute: 0 10*3/uL (ref 0.0–0.1)
Basophils Relative: 1 %
Eosinophils Absolute: 0.1 10*3/uL (ref 0.0–0.5)
Eosinophils Relative: 3 %
HCT: 37.5 % — ABNORMAL LOW (ref 39.0–52.0)
Hemoglobin: 13.1 g/dL (ref 13.0–17.0)
Immature Granulocytes: 0 %
Lymphocytes Relative: 44 %
Lymphs Abs: 2 10*3/uL (ref 0.7–4.0)
MCH: 31 pg (ref 26.0–34.0)
MCHC: 34.9 g/dL (ref 30.0–36.0)
MCV: 88.9 fL (ref 80.0–100.0)
MONOS PCT: 7 %
Monocytes Absolute: 0.3 10*3/uL (ref 0.1–1.0)
Neutro Abs: 2.1 10*3/uL (ref 1.7–7.7)
Neutrophils Relative %: 45 %
Platelets: 182 10*3/uL (ref 150–400)
RBC: 4.22 MIL/uL (ref 4.22–5.81)
RDW: 13.3 % (ref 11.5–15.5)
WBC: 4.7 10*3/uL (ref 4.0–10.5)
nRBC: 0 % (ref 0.0–0.2)

## 2018-05-28 LAB — BASIC METABOLIC PANEL
Anion gap: 8 (ref 5–15)
BUN: 9 mg/dL (ref 8–23)
CO2: 26 mmol/L (ref 22–32)
Calcium: 9 mg/dL (ref 8.9–10.3)
Chloride: 107 mmol/L (ref 98–111)
Creatinine, Ser: 1.04 mg/dL (ref 0.61–1.24)
GFR calc Af Amer: >60 mL/min (ref >60)
GFR calc non Af Amer: >60 mL/min (ref >60)
Glucose, Bld: 124 mg/dL — ABNORMAL HIGH (ref 70–99)
Potassium: 3.2 mmol/L — ABNORMAL LOW (ref 3.5–5.1)
Sodium: 141 mmol/L (ref 135–145)

## 2018-05-28 LAB — PSA: PROSTATIC SPECIFIC ANTIGEN: 0.61 ng/mL (ref 0.00–4.00)

## 2018-05-28 MED ORDER — POTASSIUM CHLORIDE CRYS ER 20 MEQ PO TBCR: 20.0000 meq | EXTENDED_RELEASE_TABLET | Freq: Every day | ORAL | 3 refills | Status: DC

## 2018-05-28 MED ORDER — DENOSUMAB 120 MG/1.7ML ~~LOC~~ SOLN: 120.0000 mg | Freq: Once | SUBCUTANEOUS | Status: DC

## 2018-05-28 NOTE — Patient Instructions (Signed)

## 2018-05-28 NOTE — Progress Notes (Signed)
Patient denies any concerns today.  

## 2018-05-31 DIAGNOSIS — H02423 Myogenic ptosis of bilateral eyelids: Secondary | ICD-10-CM | POA: Diagnosis not present

## 2018-05-31 DIAGNOSIS — H02132 Senile ectropion of right lower eyelid: Secondary | ICD-10-CM | POA: Diagnosis not present

## 2018-05-31 DIAGNOSIS — H0279 Other degenerative disorders of eyelid and periocular area: Secondary | ICD-10-CM | POA: Diagnosis not present

## 2018-05-31 DIAGNOSIS — H53483 Generalized contraction of visual field, bilateral: Secondary | ICD-10-CM | POA: Diagnosis not present

## 2018-05-31 DIAGNOSIS — H02413 Mechanical ptosis of bilateral eyelids: Secondary | ICD-10-CM | POA: Diagnosis not present

## 2018-05-31 DIAGNOSIS — H02831 Dermatochalasis of right upper eyelid: Secondary | ICD-10-CM | POA: Diagnosis not present

## 2018-05-31 DIAGNOSIS — H5069 Other mechanical strabismus: Secondary | ICD-10-CM | POA: Diagnosis not present

## 2018-05-31 DIAGNOSIS — H0289 Other specified disorders of eyelid: Secondary | ICD-10-CM | POA: Diagnosis not present

## 2018-05-31 DIAGNOSIS — H02834 Dermatochalasis of left upper eyelid: Secondary | ICD-10-CM | POA: Diagnosis not present

## 2018-05-31 DIAGNOSIS — H02135 Senile ectropion of left lower eyelid: Secondary | ICD-10-CM | POA: Diagnosis not present

## 2018-05-31 HISTORY — PX: RECONSTRUCTION OF EYELID: SHX6576

## 2018-06-04 ENCOUNTER — Other Ambulatory Visit: Payer: Non-veteran care

## 2018-06-04 ENCOUNTER — Ambulatory Visit: Payer: Non-veteran care

## 2018-06-04 ENCOUNTER — Ambulatory Visit: Payer: Non-veteran care | Admitting: Oncology

## 2018-06-21 ENCOUNTER — Other Ambulatory Visit: Payer: Self-pay | Admitting: Family Medicine

## 2018-06-21 ENCOUNTER — Other Ambulatory Visit: Payer: Self-pay | Admitting: Pharmacist

## 2018-06-21 DIAGNOSIS — C61 Malignant neoplasm of prostate: Secondary | ICD-10-CM

## 2018-06-21 DIAGNOSIS — C7951 Secondary malignant neoplasm of bone: Secondary | ICD-10-CM

## 2018-06-21 DIAGNOSIS — E785 Hyperlipidemia, unspecified: Secondary | ICD-10-CM

## 2018-06-21 NOTE — Patient Outreach (Signed)
[facility]) Care Management  [place] - Medication Adherence   06/21/2018  Justin Ali 02/10/1941 [id number]  [facility] Medication: lisinopril 5 mg Current insurance:[employer]   Outreach:  Incoming call from [name] in response to the [facility] Medication Adherence Campaign. Speak with patient. HIPAA identifiers verified.  Subjective:  Justin Ali reports that he takes his lisinopril 5 mg once daily as directed. Denies any missed doses or barriers to adherence. Reports that he has recently started getting this medication as well as most of his other prescriptions from his [place].  Denies any medication questions/concerns at this time.   Objective: Lab Results  Component Value Date   CREATININE 1.04 05/28/2018   CREATININE 0.97 02/26/2018   CREATININE 1.00 01/29/2018    No results found for: HGBA1C  Lipid Panel     Component Value Date/Time   CHOL 162 06/16/2017 0936   TRIG 351.0 (H) 06/16/2017 0936   HDL 24.00 (L) 06/16/2017 0936   CHOLHDL 7 06/16/2017 0936   VLDL 70.2 (H) 06/16/2017 0936   LDLCALC 88 06/13/2016 0815   LDLDIRECT 81.0 06/16/2017 0936    BP Readings from Last 3 Encounters:  05/28/18 (!) 152/79  02/26/18 (!) 159/76  01/29/18 (!) 151/74    No Known Allergies   Assessment:  . No barriers identified   Plan:  Will close pharmacy episode at this time.   Harlow Asa, PharmD, [place] [facility] [phone]

## 2018-06-22 ENCOUNTER — Ambulatory Visit: Payer: PPO

## 2018-06-22 ENCOUNTER — Ambulatory Visit (INDEPENDENT_AMBULATORY_CARE_PROVIDER_SITE_OTHER): Payer: PPO

## 2018-06-22 VITALS — BP 134/80 | HR 87 | Temp 98.1°F | Ht 66.5 in | Wt 209.9 lb

## 2018-06-22 DIAGNOSIS — E785 Hyperlipidemia, unspecified: Secondary | ICD-10-CM | POA: Diagnosis not present

## 2018-06-22 DIAGNOSIS — Z0000 Encounter for general adult medical examination without abnormal findings: Secondary | ICD-10-CM

## 2018-06-22 LAB — COMPREHENSIVE METABOLIC PANEL
ALT: 38 U/L (ref 0–53)
AST: 28 U/L (ref 0–37)
Albumin: 4 g/dL (ref 3.5–5.2)
Alkaline Phosphatase: 59 U/L (ref 39–117)
BUN: 10 mg/dL (ref 6–23)
CO2: 30 mEq/L (ref 19–32)
Calcium: 9.4 mg/dL (ref 8.4–10.5)
Chloride: 106 mEq/L (ref 96–112)
Creatinine, Ser: 1.07 mg/dL (ref 0.40–1.50)
GFR: 67.09 mL/min (ref >60.00)
Glucose, Bld: 100 mg/dL — ABNORMAL HIGH (ref 70–99)
Potassium: 3.8 mEq/L (ref 3.5–5.1)
Sodium: 143 mEq/L (ref 135–145)
Total Bilirubin: 0.5 mg/dL (ref 0.2–1.2)
Total Protein: 6.6 g/dL (ref 6.0–8.3)

## 2018-06-22 LAB — LIPID PANEL
CHOL/HDL RATIO: 6
Cholesterol: 162 mg/dL (ref 0–200)
HDL: 28.1 mg/dL — ABNORMAL LOW (ref >39.00)
NONHDL: 133.92
Triglycerides: 342 mg/dL — ABNORMAL HIGH (ref 0.0–149.0)
VLDL: 68.4 mg/dL — ABNORMAL HIGH (ref 0.0–40.0)

## 2018-06-22 LAB — LDL CHOLESTEROL, DIRECT: Direct LDL: 85 mg/dL

## 2018-06-22 NOTE — Progress Notes (Signed)
Subjective:   Justin Ali is a 78 y.o. male who presents for Medicare Annual/Subsequent preventive examination.  Review of Systems:  N/A Cardiac Risk Factors include: advanced age (>20men, >1 women);male gender;obesity (BMI >30kg/m2);dyslipidemia;hypertension     Objective:    Vitals: BP 134/80 (BP Location: Right Arm, Patient Position: Sitting, Cuff Size: Normal)   Pulse 87   Temp 98.1 F (36.7 C) (Oral)   Ht 5' 6.5" (1.689 m) Comment: shoes  Wt 209 lb 14.4 oz (95.2 kg)   SpO2 94%   BMI 33.37 kg/m   Body mass index is 33.37 kg/m.  Advanced Directives 06/22/2018 05/28/2018 12/04/2017 09/29/2017 06/19/2017 06/16/2017 09/26/2016  Does Patient Have a Medical Advance Directive? Yes Yes Yes Yes Yes Yes Yes  Type of Paramedic of [place];Living will [place];Living will - [place];Living will - [place];Living will Living will;Healthcare Power of Attorney  Does patient want to make changes to medical advance directive? - No - Patient declined - - No - Patient declined - -  Copy of [place] in Chart? Yes - validated most recent copy scanned in chart (See row information) - - Yes - No - copy requested No - copy requested    Tobacco Social History   Tobacco Use  Smoking Status Former Smoker  . Types: Cigarettes, Cigars  . Last attempt to quit: 04/28/2002  . Years since quitting: 16.1  Smokeless Tobacco Never Used  Tobacco Comment   rare cigar     Counseling given: No Comment: rare cigar   Clinical Intake:  Pre-visit preparation completed: Yes  Pain : No/denies pain Pain Score: 0-No pain     Nutritional Status: BMI > 30  Obese Nutritional Risks: None Diabetes: No  How often do you need to have someone help you when you read instructions, pamphlets, or other written materials from your doctor or pharmacy?: 1 - Never What is the last grade level you completed in  school?: 12th grade  Interpreter Needed?: No  Comments: pt lives with spouse Information entered by :: LPinson, LPN  Past Medical History:  Diagnosis Date  . 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 CAD (coronary artery disease), native coronary artery 2014   By CT lung (04/2013)   . Cataract   . HLD (hyperlipidemia)   . HTN (hypertension)   . Hyperglycemia   . NAFLD (nonalcoholic fatty liver disease) 08/2011   by abd [place] with increased LFTs  . OSA (obstructive sleep apnea)    did not tolerate CPAP  . Other benign neoplasm of connective and other soft tissue of unspecified site    Neurofibroma of lateral periorbital area  . Peripheral neuropathy   . Personal history of colonic adenomas 08/05/2012  . Pneumonia 01/2013   CAP LUL  . Restless legs syndrome (RLS)   . Sleep apnea    no cpap   Past Surgical History:  Procedure Laterality Date  . COLONOSCOPY  07/2012   5 adenomas (tubular, TV, serrated), rec rpt 5 months [name])  . COLONOSCOPY  02/2013   residual adenomas, cecal AVM, rec rpt 1 yr [name])  . COLONOSCOPY  03/2014   no residual polyp, cecal AVM, rpt 3-4 yrs [name])  . ECTROPION REPAIR Bilateral 09/29/2017   Procedure: REPAIR OF ECTROPION, EXTENSIVE UPPER AND LOWER;  Surgeon: Karle Starch, MD;  Location: [facility];  Service: Ophthalmology;  Laterality: Bilateral;  .  ORIF ANKLE FRACTURE  11/29/00   Dr. Tamala Julian  . PTOSIS REPAIR Bilateral 09/29/2017   Procedure: BLEPHAROPTOSIS REPAIR RESECT EX UPPER;  Surgeon: Karle Starch, MD;  Location: [facility];  Service: Ophthalmology;  Laterality: Bilateral;  . RECONSTRUCTION OF EYELID Bilateral 05/31/2018  . SKIN CANCER EXCISION  1997   left anterior neck   Family History  Problem Relation Age of Onset  . Cancer Father        liver  . Alcohol abuse Father   . Liver cancer Father   . Cancer Mother        breast  . Breast cancer Mother   . Hypertension Brother          Half-brother  . Coronary artery disease Paternal Aunt        CABG  . Cancer Other        GM; Aunt - breast  . Colon cancer Neg Hx   . Rectal cancer Neg Hx   . Stomach cancer Neg Hx   . Esophageal cancer Neg Hx    Social History   Socioeconomic History  . Marital status: Married    Spouse name: Not on file  . Number of children: 2  . Years of education: Not on file  . Highest education level: Not on file  Occupational History  . Occupation: Truck Geophysicist/field seismologist    Comment: [employer]  . Financial resource strain: Not on file  . Food insecurity:    Worry: Not on file    Inability: Not on file  . Transportation needs:    Medical: Not on file    Non-medical: Not on file  Tobacco Use  . Smoking status: Former Smoker    Types: Cigarettes, Cigars    Last attempt to quit: 04/28/2002    Years since quitting: 16.1  . Smokeless tobacco: Never Used  . Tobacco comment: rare cigar  Substance and Sexual Activity  . Alcohol use: Not Currently  . Drug use: No  . Sexual activity: Not on file  Lifestyle  . Physical activity:    Days per week: Not on file    Minutes per session: Not on file  . Stress: Not on file  Relationships  . Social connections:    Talks on phone: Not on file    Gets together: Not on file    Attends religious service: Not on file    Active member of club or organization: Not on file    Attends meetings of clubs or organizations: Not on file    Relationship status: Not on file  Other Topics Concern  . Not on file  Social History Narrative   Caffeine: occasional coffee   Lives with wife, 1 cat   Occupation: truck driver Adult nurse)   Activity: goes to [employer] 3x/wk   Diet: rare water, lots of sweet tea, some fruits/vegetables    Outpatient Encounter Medications as of 06/22/2018  Medication Sig  . abiraterone acetate (ZYTIGA) 250 MG tablet Take 4 tablets (1,000 mg total) by mouth daily. Take on an empty stomach 1 hour before or 2 hours  after a meal  . amLODipine (NORVASC) 5 MG tablet Take 1 tablet (5 mg total) by mouth daily.  Marland Kitchen aspirin 81 MG tablet Take 81 mg by mouth daily.  Marland Kitchen CALCIUM PO Take 1,200 mg by mouth daily.  . cholecalciferol (VITAMIN D) 1000 UNITS tablet Take 2,000 Units by mouth daily.   Marland Kitchen erythromycin [facility]) ophthalmic ointment Use  a small amount on your sutures 4 times a day for the next 2 weeks. Switch to Aquaphor ointment should allergy develop.  Marland Kitchen gabapentin (NEURONTIN) 300 MG capsule Take 1 capsule (300 mg total) by mouth at bedtime.  Marland Kitchen HYDROcodone-acetaminophen (NORCO/VICODIN) 5-325 MG tablet Take 1 tablet by mouth daily. As needed.  Marland Kitchen lisinopril (PRINIVIL,ZESTRIL) 5 MG tablet Take 1 tablet (5 mg total) by mouth daily.  . Multiple Vitamins-Minerals (CENTRUM SILVER 50+MEN) TABS Take by mouth daily.  . Omega-3 Fatty Acids (FISH OIL) 1000 MG CAPS Take by mouth daily.  . potassium chloride SA (K-DUR,KLOR-CON) 20 MEQ tablet Take 1 tablet (20 mEq total) by mouth daily.  . predniSONE (DELTASONE) 5 MG tablet TAKE 1 TABLET BY MOUTH DAILY WITH BREAKFAST.  Marland Kitchen temazepam (RESTORIL) 30 MG capsule Take 1 capsule (30 mg total) by mouth at bedtime as needed for sleep.  . traMADol (ULTRAM) 50 MG tablet Take 1 tablet (50 mg total) by mouth 2 (two) times daily as needed.  . traMADol (ULTRAM) 50 MG tablet Take 1 every 4-6 hours as needed for pain not controlled by Tylenol   No facility-administered encounter medications on file as of 06/22/2018.     Activities of Daily Living In your present state of health, do you have any difficulty performing the following activities: 06/22/2018 09/29/2017  Hearing? Y N  Vision? N N  Difficulty concentrating or making decisions? Y N  Walking or climbing stairs? N N  Dressing or bathing? N N  Doing errands, shopping? N -  Preparing Food and eating ? N -  Using the Toilet? N -  In the past six months, have you accidently leaked urine? N -  Do you have problems with loss of bowel  control? N -  Managing your Medications? N -  Managing your Finances? N -  Housekeeping or managing your Housekeeping? N -  Some recent data might be hidden    Patient Care Team: Ria Bush, MD as PCP - General (Family Medicine) Birder Robson, MD as Referring Physician (Ophthalmology)   Assessment:   This is a routine wellness examination for Ashaun.  Hearing Screening Comments: Two audiology in 2019; future appt at [facility] for fitting of hearing aids Vision Screening Comments: Vision exam in 2019 [facility]  Exercise Activities and Dietary recommendations Current Exercise Habits: The patient does not participate in regular exercise at present, Exercise limited by: None identified  Goals    . Patient Stated     Starting 06/22/18, I will continue to take medications as prescribed.        Fall Risk Fall Risk  06/22/2018 06/16/2017 06/13/2016 06/12/2015 06/07/2014  Falls in the past year? 0 Yes No No No  Comment - tripped over rocks and injured left knee - - -  Number falls in past yr: - 1 - - -  Injury with Fall? - Yes - - -   Depression Screen PHQ 2/9 Scores 06/22/2018 06/16/2017 06/13/2016 06/12/2015  PHQ - 2 Score 0 0 0 0  PHQ- 9 Score 0 0 - -    Cognitive Function MMSE - Mini Mental State Exam 06/22/2018 06/16/2017 06/13/2016  Orientation to time 5 5 5   Orientation to Place 5 5 5   Registration 3 3 3   Attention/ Calculation 0 0 0  Recall 1 3 3   Recall-comments unable to recall 2 of 3 words - -  Language- name 2 objects 0 0 0  Language- repeat 1 1 1   Language- follow 3  step command 3 3 3   Language- read & follow direction 0 0 0  Write a sentence 0 0 0  Copy design 0 0 0  Total score 18 20 20      PLEASE NOTE: A Mini-Cog screen was completed. Maximum score is 20. A value of 0 denotes this part of [place] MMSE was not completed or the patient failed this part of the Mini-Cog screening.   Mini-Cog Screening Orientation to Time - Max 5 pts Orientation to  Place - Max 5 pts Registration - Max 3 pts Recall - Max 3 pts Language Repeat - Max 1 pts Language Follow 3 Step Command - Max 3 pts     Immunization History  Administered Date(s) Administered  . Influenza Whole 01/24/2008, 01/08/2009, 02/08/2013  . Influenza, Seasonal, Injecte, Preservative Fre 01/04/2014  . Influenza,inj,Quad PF,6+ Mos 01/02/2015, 01/09/2016, 01/02/2017, 01/14/2018  . Pneumococcal Conjugate-13 06/02/2013  . Pneumococcal Polysaccharide-23 06/12/2010  . Td 08/27/2002, 04/28/2010    Screening Tests Health Maintenance  Topic Date Due  . COLONOSCOPY  04/27/2020 (Originally 03/22/2017)  . DTaP/Tdap/Td (1 - Tdap) 04/28/2020 (Originally 11/18/1951)  . TETANUS/TDAP  04/28/2020  . INFLUENZA VACCINE  Completed  . PNA vac Low Risk Adult  Completed     Plan:     I have personally reviewed, addressed, and noted the following in the patient's chart:  A. Medical and social history B. Use of alcohol, tobacco or illicit drugs  C. Current medications and supplements D. Functional ability and status E.  Nutritional status F.  Physical activity G. Advance directives H. List of other physicians I.  Hospitalizations, surgeries, and ER visits in previous 12 months J.  [place] to include hearing, vision, cognitive, depression L. Referrals and appointments - none  In addition, I have reviewed and discussed with patient certain preventive protocols, quality metrics, and best practice recommendations. A written personalized care plan for preventive services as well as general preventive health recommendations were provided to patient.  See attached scanned questionnaire for additional information.   Signed,   Lindell Noe, MHA, BS, LPN Health Coach

## 2018-06-22 NOTE — Progress Notes (Signed)
PCP notes:   Health maintenance:  Colonoscopy - pt declined  Abnormal screenings:   Mini-Cog score: 18/20 MMSE - Mini Mental State Exam 06/22/2018 06/16/2017 06/13/2016  Orientation to time 5 5 5   Orientation to Place 5 5 5   Registration 3 3 3   Attention/ Calculation 0 0 0  Recall 1 3 3   Recall-comments unable to recall 2 of 3 words - -  Language- name 2 objects 0 0 0  Language- repeat 1 1 1   Language- follow 3 step command 3 3 3   Language- read & follow direction 0 0 0  Write a sentence 0 0 0  Copy design 0 0 0  Total score 18 20 20     Patient concerns:   None  Nurse concerns:  None  Next PCP appt:   07/01/18 @ 0930

## 2018-06-22 NOTE — Patient Instructions (Signed)
Justin Ali , Thank you for taking time to come for your Medicare Wellness Visit. I appreciate your ongoing commitment to your health goals. Please review the following plan we discussed and let me know if I can assist you in the future.   These are the goals we discussed: Goals    . Patient Stated     Starting 06/22/18, I will continue to take medications as prescribed.        This is a list of the screening recommended for you and due dates:  Health Maintenance  Topic Date Due  . Colon Cancer Screening  04/27/2020*  . DTaP/Tdap/Td vaccine (1 - Tdap) 04/28/2020*  . Tetanus Vaccine  04/28/2020  . Flu Shot  Completed  . Pneumonia vaccines  Completed  *Topic was postponed. The date shown is not the original due date.   Preventive Care for Adults  A healthy lifestyle and preventive care can promote health and wellness. Preventive health guidelines for adults include the following key practices.  . A routine yearly physical is a good way to check with your health care provider about your health and preventive screening. It is a chance to share any concerns and updates on your health and to receive a thorough exam.  . Visit your dentist for a routine exam and preventive care every 6 months. Brush your teeth twice a day and floss once a day. Good oral hygiene prevents tooth decay and gum disease.  . The frequency of eye exams is based on your age, health, family medical history, use  of contact lenses, and other factors. Follow your health care provider's recommendations for frequency of eye exams.  . Eat a healthy diet. Foods like vegetables, fruits, whole grains, low-fat dairy products, and lean protein foods contain the nutrients you need without too many calories. Decrease your intake of foods high in solid fats, added sugars, and salt. Eat the right amount of calories for you. Get information about a proper diet from your health care provider, if necessary.  . Regular physical  exercise is one of the most important things you can do for your health. Most adults should get at least 150 minutes of moderate-intensity exercise (any activity that increases your heart rate and causes you to sweat) each week. In addition, most adults need muscle-strengthening exercises on 2 or more days a week.  Silver Sneakers may be a benefit available to you. To determine eligibility, you may visit the website: www.silversneakers.com or contact program at 260-839-1783 Mon-Fri between 8AM-8PM.   . Maintain a healthy weight. The body mass index (BMI) is a screening tool to identify possible weight problems. It provides an estimate of body fat based on height and weight. Your health care provider can find your BMI and can help you achieve or maintain a healthy weight.   For adults 20 years and older: ? A BMI below 18.5 is considered underweight. ? A BMI of 18.5 to 24.9 is normal. ? A BMI of 25 to 29.9 is considered overweight. ? A BMI of 30 and above is considered obese.   . Maintain normal blood lipids and cholesterol levels by exercising and minimizing your intake of saturated fat. Eat a balanced diet with plenty of fruit and vegetables. Blood tests for lipids and cholesterol should begin at age 55 and be repeated every 5 years. If your lipid or cholesterol levels are high, you are over 50, or you are at high risk for heart disease, you may need your cholesterol  levels checked more frequently. Ongoing high lipid and cholesterol levels should be treated with medicines if diet and exercise are not working.  . If you smoke, find out from your health care provider how to quit. If you do not use tobacco, please do not start.  . If you choose to drink alcohol, please do not consume more than 2 drinks per day. One drink is considered to be 12 ounces (355 mL) of beer, 5 ounces (148 mL) of wine, or 1.5 ounces (44 mL) of liquor.  . If you are 58-73 years old, ask your health care provider if you  should take aspirin to prevent strokes.  . Use sunscreen. Apply sunscreen liberally and repeatedly throughout the day. You should seek shade when your shadow is shorter than you. Protect yourself by wearing long sleeves, pants, a wide-brimmed hat, and sunglasses year round, whenever you are outdoors.  . Once a month, do a whole body skin exam, using a mirror to look at the skin on your back. Tell your health care provider of new moles, moles that have irregular borders, moles that are larger than a pencil eraser, or moles that have changed in shape or color.

## 2018-07-01 ENCOUNTER — Ambulatory Visit (INDEPENDENT_AMBULATORY_CARE_PROVIDER_SITE_OTHER): Payer: PPO | Admitting: Family Medicine

## 2018-07-01 ENCOUNTER — Encounter: Payer: Self-pay | Admitting: Family Medicine

## 2018-07-01 VITALS — BP 130/80 | HR 72 | Temp 97.8°F | Ht 66.5 in | Wt 207.1 lb

## 2018-07-01 DIAGNOSIS — Z860100 Personal history of colon polyps, unspecified: Secondary | ICD-10-CM

## 2018-07-01 DIAGNOSIS — E66811 Obesity, class 1: Secondary | ICD-10-CM

## 2018-07-01 DIAGNOSIS — C7951 Secondary malignant neoplasm of bone: Secondary | ICD-10-CM | POA: Diagnosis not present

## 2018-07-01 DIAGNOSIS — C61 Malignant neoplasm of prostate: Secondary | ICD-10-CM

## 2018-07-01 DIAGNOSIS — I10 Essential (primary) hypertension: Secondary | ICD-10-CM

## 2018-07-01 DIAGNOSIS — G4700 Insomnia, unspecified: Secondary | ICD-10-CM

## 2018-07-01 DIAGNOSIS — Z0000 Encounter for general adult medical examination without abnormal findings: Secondary | ICD-10-CM | POA: Diagnosis not present

## 2018-07-01 DIAGNOSIS — E785 Hyperlipidemia, unspecified: Secondary | ICD-10-CM

## 2018-07-01 DIAGNOSIS — E669 Obesity, unspecified: Secondary | ICD-10-CM | POA: Diagnosis not present

## 2018-07-01 DIAGNOSIS — H0259 Other disorders affecting eyelid function: Secondary | ICD-10-CM

## 2018-07-01 MED ORDER — TRAMADOL HCL 50 MG PO TABS: 50.0000 mg | ORAL_TABLET | Freq: Two times a day (BID) | ORAL | 3 refills | Status: DC | PRN

## 2018-07-01 NOTE — Assessment & Plan Note (Signed)
Declines further colonoscopy.

## 2018-07-01 NOTE — Assessment & Plan Note (Signed)
Chronic, off meds. Reviewed elevated triglyceride levels and diet changes to improve readings as per instructions. He does not like fatty fish and is not interested in medication.  The 10-year ASCVD risk score Mikey Bussing DC Brooke Bonito., et al., 2013) is: 34%   Values used to calculate the score:     Age: 78 years     Sex: Male     Is Non-Hispanic African American: No     Diabetic: No     Tobacco smoker: No     Systolic Blood Pressure: 557 mmHg     Is BP treated: Yes     HDL Cholesterol: 28.1 mg/dL     Total Cholesterol: 162 mg/dL

## 2018-07-01 NOTE — Assessment & Plan Note (Signed)
Chronic, stable. Continue current regimen. 

## 2018-07-01 NOTE — Assessment & Plan Note (Signed)
Preventative protocols reviewed and updated unless pt declined. Discussed healthy diet and lifestyle.  

## 2018-07-01 NOTE — Patient Instructions (Addendum)
Triglycerides were too high - decrease added sugars, eliminate trans fats, increase fiber and limit alcohol. Increase fatty fish (salmon, tuna, trout, etc) in the diet - these fish are rich in omega three fatty acids. All these changes together can drop triglycerides by almost 50%.  Good to see you today  Return as needed or in 1 year for next physical.  Health Maintenance After Age 77 After age 28, you are at a higher risk for certain long-term diseases and infections as well as injuries from falls. Falls are a major cause of broken bones and head injuries in people who are older than age 65. Getting regular preventive care can help to keep you healthy and well. Preventive care includes getting regular testing and making lifestyle changes as recommended by your health care provider. Talk with your health care provider about:  Which screenings and tests you should have. A screening is a test that checks for a disease when you have no symptoms.  A diet and exercise plan that is right for you. What should I know about screenings and tests to prevent falls? Screening and testing are the best ways to find a health problem early. Early diagnosis and treatment give you the best chance of managing medical conditions that are common after age 65. Certain conditions and lifestyle choices may make you more likely to have a fall. Your health care provider may recommend:  Regular vision checks. Poor vision and conditions such as cataracts can make you more likely to have a fall. If you wear glasses, make sure to get your prescription updated if your vision changes.  Medicine review. Work with your health care provider to regularly review all of the medicines you are taking, including over-the-counter medicines. Ask your health care provider about any side effects that may make you more likely to have a fall. Tell your health care provider if any medicines that you take make you feel dizzy or  sleepy.  Osteoporosis screening. Osteoporosis is a condition that causes the bones to get weaker. This can make the bones weak and cause them to break more easily.  Blood pressure screening. Blood pressure changes and medicines to control blood pressure can make you feel dizzy.  Strength and balance checks. Your health care provider may recommend certain tests to check your strength and balance while standing, walking, or changing positions.  Foot health exam. Foot pain and numbness, as well as not wearing proper footwear, can make you more likely to have a fall.  Depression screening. You may be more likely to have a fall if you have a fear of falling, feel emotionally low, or feel unable to do activities that you used to do.  Alcohol use screening. Using too much alcohol can affect your balance and may make you more likely to have a fall. What actions can I take to lower my risk of falls? General instructions  Talk with your health care provider about your risks for falling. Tell your health care provider if: ? You fall. Be sure to tell your health care provider about all falls, even ones that seem minor. ? You feel dizzy, sleepy, or off-balance.  Take over-the-counter and prescription medicines only as told by your health care provider. These include any supplements.  Eat a healthy diet and maintain a healthy weight. A healthy diet includes low-fat dairy products, low-fat (lean) meats, and fiber from whole grains, beans, and lots of fruits and vegetables. Home safety  Remove any tripping hazards, such  as rugs, cords, and clutter.  Install safety equipment such as grab bars in bathrooms and safety rails on stairs.  Keep rooms and walkways well-lit. Activity   Follow a regular exercise program to stay fit. This will help you maintain your balance. Ask your health care provider what types of exercise are appropriate for you.  If you need a cane or walker, use it as recommended by  your health care provider.  Wear supportive shoes that have nonskid soles. Lifestyle  Do not drink alcohol if your health care provider tells you not to drink.  If you drink alcohol, limit how much you have: ? 0-1 drink a day for women. ? 0-2 drinks a day for men.  Be aware of how much alcohol is in your drink. In the [place] one drink equals one typical bottle of beer (12 oz), one-half glass of wine (5 oz), or one shot of hard liquor (1 oz).  Do not use any products that contain nicotine or tobacco, such as cigarettes and e-cigarettes. If you need help quitting, ask your health care provider. Summary  Having a healthy lifestyle and getting preventive care can help to protect your health and wellness after age 2.  Screening and testing are the best way to find a health problem early and help you avoid having a fall. Early diagnosis and treatment give you the best chance for managing medical conditions that are more common for people who are older than age 38.  Falls are a major cause of broken bones and head injuries in people who are older than age 61. Take precautions to prevent a fall at home.  Work with your health care provider to learn what changes you can make to improve your health and wellness and to prevent falls. This information is not intended to replace advice given to you by your health care provider. Make sure you discuss any questions you have with your health care provider. Document Released: 02/25/2017 Document Revised: 02/25/2017 Document Reviewed: 02/25/2017 Elsevier Interactive Patient Education  2019 [employer].

## 2018-07-01 NOTE — Assessment & Plan Note (Addendum)
Appreciate onc care. Seen regularly. On zytiga for this.  PRN tramadol for pain.

## 2018-07-01 NOTE — Assessment & Plan Note (Signed)
Encouraged healthy diet and lifestyle changes to affect sustainable weight loss.  

## 2018-07-01 NOTE — Assessment & Plan Note (Signed)
Chronic, continue temazepam PRN

## 2018-07-01 NOTE — Progress Notes (Signed)
BP 130/80 (BP Location: Left Arm, Patient Position: Sitting, Cuff Size: Normal)   Pulse 72   Temp 97.8 F (36.6 C) (Oral)   Ht 5' 6.5" (1.689 m)   Wt 207 lb 1 oz (93.9 kg)   SpO2 96%   BMI 32.92 kg/m    CC: CPE Subjective:    Patient ID: Justin Ali, male    DOB: 1940-06-24, 78 y.o.   MRN: [id number]  HPI: Justin Ali is a 78 y.o. male presenting on 07/01/2018 for Annual Exam (Pt 2.  Wants to discuss potassium. ) and Abdominal Pain (C/o occasional RLQ abd pain. Says he can use his thumb to push on the area and the pain stops. Tramadol helps. )   Seeing plastic surgeon in [place] for eyelid revision.   Saw Lesia last week for medicare wellness visit. Note reviewed.    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 On xgeva and zytiga/prednisone.   Insomnia - takes temazepam nightly for sleep. Also takes aleve PM and gabapentin.   Preventative: COLONOSCOPY Date: 03/2014 no residual polyp, cecal AVM, rpt 3-4 yrs [name]). Pt has decided to stop screening.  Prostate cancer - see above Lung cancer screening - not eligible. Flu shot yearly Td - 2012  Pneumovax 2012, prevnar 2015 Shingrix - discussed - not interested Advanced directives -Received advanced directives, scanned 05/2015. No prolonged life support if terminal condition. No HCPOA form.Wife would be HCPOA. Wants to be buried in [facility].  Seat belt use discussed.  Sunscreen use discussed. No changing moles. Ex smoker - quit 2004. Mainly cigars.  Alcohol - stopped 03/2017  Dentist due  Eye exam regular   Caffeine: occasional coffee  Lives with wife, 1 cat  Occupation: truck driver Adult nurse)  Activity: goes to [employer] 3x/wk  Diet: rare water, lots of sweet tea, some fruits/vegetables     Relevant past medical, surgical, family and social history reviewed and updated as indicated. Interim medical history since our last visit reviewed. Allergies and  medications reviewed and updated. Outpatient Medications Prior to Visit  Medication Sig Dispense Refill  . abiraterone acetate (ZYTIGA) 250 MG tablet Take 4 tablets (1,000 mg total) by mouth daily. Take on an empty stomach 1 hour before or 2 hours after a meal 120 tablet 5  . amLODipine (NORVASC) 5 MG tablet Take 1 tablet (5 mg total) by mouth daily. 90 tablet 3  . CALCIUM PO Take 1,200 mg by mouth daily.    . cholecalciferol (VITAMIN D) 1000 UNITS tablet Take 2,000 Units by mouth daily.     Marland Kitchen gabapentin (NEURONTIN) 300 MG capsule Take 1 capsule (300 mg total) by mouth at bedtime. 90 capsule 3  . HYDROcodone-acetaminophen (NORCO/VICODIN) 5-325 MG tablet Take 1 tablet by mouth daily. As needed. 60 tablet 0  . lisinopril (PRINIVIL,ZESTRIL) 5 MG tablet Take 1 tablet (5 mg total) by mouth daily. 90 tablet 3  . Multiple Vitamins-Minerals (CENTRUM SILVER 50+MEN) TABS Take by mouth daily.    . Omega-3 Fatty Acids (FISH OIL) 1000 MG CAPS Take by mouth daily.    . potassium chloride SA (K-DUR,KLOR-CON) 20 MEQ tablet Take 1 tablet (20 mEq total) by mouth daily. 30 tablet 3  . predniSONE (DELTASONE) 5 MG tablet TAKE 1 TABLET BY MOUTH DAILY WITH BREAKFAST. 30 tablet 5  . temazepam (RESTORIL) 30 MG capsule Take 1 capsule (30 mg total) by mouth at bedtime as needed for sleep. 30 capsule 0  .  traMADol (ULTRAM) 50 MG tablet Take 1 tablet (50 mg total) by mouth 2 (two) times daily as needed. 30 tablet 5  . aspirin 81 MG tablet Take 81 mg by mouth daily.    Marland Kitchen erythromycin [facility]) ophthalmic ointment Use a small amount on your sutures 4 times a day for the next 2 weeks. Switch to Aquaphor ointment should allergy develop. 3.5 g 3  . traMADol (ULTRAM) 50 MG tablet Take 1 every 4-6 hours as needed for pain not controlled by Tylenol 6 tablet 0   No facility-administered medications prior to visit.      Per HPI unless specifically indicated in ROS section below Review of Systems  Constitutional: Negative for  activity change, appetite change, chills, fatigue, fever and unexpected weight change.  HENT: Negative for hearing loss.   Eyes: Negative for visual disturbance.  Respiratory: Negative for cough, chest tightness, shortness of breath and wheezing.   Cardiovascular: Negative for chest pain, palpitations and leg swelling.  Gastrointestinal: Positive for abdominal pain (intermittent RLQ discomfort) and diarrhea. Negative for abdominal distention, blood in stool, constipation, nausea and vomiting.  Genitourinary: Negative for difficulty urinating and hematuria.  Musculoskeletal: Negative for arthralgias, myalgias and neck pain.  Skin: Negative for rash.  Neurological: Negative for dizziness, seizures, syncope and headaches.  Hematological: Negative for adenopathy. Does not bruise/bleed easily.  Psychiatric/Behavioral: Negative for dysphoric mood. The patient is not nervous/anxious.    Objective:    BP 130/80 (BP Location: Left Arm, Patient Position: Sitting, Cuff Size: Normal)   Pulse 72   Temp 97.8 F (36.6 C) (Oral)   Ht 5' 6.5" (1.689 m)   Wt 207 lb 1 oz (93.9 kg)   SpO2 96%   BMI 32.92 kg/m   Wt Readings from Last 3 Encounters:  07/01/18 207 lb 1 oz (93.9 kg)  06/22/18 209 lb 14.4 oz (95.2 kg)  05/28/18 209 lb (94.8 kg)    Physical Exam Vitals signs and nursing note reviewed.  Constitutional:      General: He is not in acute distress.    Appearance: Normal appearance. He is well-developed.  HENT:     Head: Normocephalic and atraumatic.     Right Ear: Hearing, tympanic membrane, ear canal and external ear normal.     Left Ear: Hearing, tympanic membrane, ear canal and external ear normal.     Nose: Nose normal.     Mouth/Throat:     Mouth: Mucous membranes are moist.     Pharynx: Uvula midline. No oropharyngeal exudate or posterior oropharyngeal erythema.  Eyes:     General: No scleral icterus.    Conjunctiva/sclera: Conjunctivae normal.     Pupils: Pupils are equal, round,  and reactive to light.  Neck:     Musculoskeletal: Normal range of motion and neck supple.     Vascular: No carotid bruit.  Cardiovascular:     Rate and Rhythm: Normal rate and regular rhythm.     Pulses: Normal pulses.          Radial pulses are 2+ on the right side and 2+ on the left side.     Heart sounds: Normal heart sounds. No murmur.  Pulmonary:     Effort: Pulmonary effort is normal. No respiratory distress.     Breath sounds: Normal breath sounds. No wheezing, rhonchi or rales.  Abdominal:     General: Bowel sounds are normal. There is no distension.     Palpations: Abdomen is soft. There is no mass.  Tenderness: There is no abdominal tenderness. There is no guarding or rebound.     Comments: Mild discomfort to palpation of RLQ abd wall  Musculoskeletal: Normal range of motion.  Lymphadenopathy:     Cervical: No cervical adenopathy.  Skin:    General: Skin is warm and dry.     Findings: No rash.  Neurological:     Mental Status: He is alert and oriented to person, place, and time.     Comments: CN grossly intact, station and gait intact  Psychiatric:        Mood and Affect: Mood normal.        Behavior: Behavior normal.        Thought Content: Thought content normal.        Judgment: Judgment normal.       Results for orders placed or performed in visit on 06/22/18  Comprehensive metabolic panel  Result Value Ref Range   Sodium 143 135 - 145 mEq/L   Potassium 3.8 3.5 - 5.1 mEq/L   Chloride 106 96 - 112 mEq/L   CO2 30 19 - 32 mEq/L   Glucose, Bld 100 (H) 70 - 99 mg/dL   BUN 10 6 - 23 mg/dL   Creatinine, Ser 1.07 0.40 - 1.50 mg/dL   Total Bilirubin 0.5 0.2 - 1.2 mg/dL   Alkaline Phosphatase 59 39 - 117 U/L   AST 28 0 - 37 U/L   ALT 38 0 - 53 U/L   Total Protein 6.6 6.0 - 8.3 g/dL   Albumin 4.0 3.5 - 5.2 g/dL   Calcium 9.4 8.4 - 10.5 mg/dL   GFR 67.09 >60.00 mL/min  Lipid panel  Result Value Ref Range   Cholesterol 162 0 - 200 mg/dL   Triglycerides  342.0 (H) 0.0 - 149.0 mg/dL   HDL 28.10 (L) >39.00 mg/dL   VLDL 68.4 (H) 0.0 - 40.0 mg/dL   Total CHOL/HDL Ratio 6    NonHDL 133.92   LDL cholesterol, direct  Result Value Ref Range   Direct LDL 85.0 mg/dL   Assessment & Plan:   Problem List Items Addressed This Visit    Primary malignant 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 [facility])    Appreciate onc care. Seen regularly. On zytiga for this.  PRN tramadol for pain.       Personal history of colonic adenomas    Declines further colonoscopy.       Obesity, Class I, BMI 30-34.9    Encouraged healthy diet and lifestyle changes to affect sustainable weight loss.       Laxity of eyelid    Had revision of ectropion scar 05/2018 - upcoming appt next month.       Insomnia    Chronic, continue temazepam PRN      HYPERTENSION, BENIGN ESSENTIAL    Chronic, stable. Continue current regimen.       Health maintenance examination - Primary    Preventative protocols reviewed and updated unless pt declined. Discussed healthy diet and lifestyle.       Dyslipidemia    Chronic, off meds. Reviewed elevated triglyceride levels and diet changes to improve readings as per instructions. He does not like fatty fish and is not interested in medication.  The 10-year ASCVD risk score Mikey Bussing DC Brooke Bonito., et al., 2013) is: 34%   Values used to calculate the score:     Age: 89 years     Sex: Male     Is Non-Hispanic African American: No  Diabetic: No     Tobacco smoker: No     Systolic Blood Pressure: 532 mmHg     Is BP treated: Yes     HDL Cholesterol: 28.1 mg/dL     Total Cholesterol: 162 mg/dL           Meds ordered this encounter  Medications  . traMADol (ULTRAM) 50 MG tablet    Sig: Take 1 tablet (50 mg total) by mouth 2 (two) times daily as needed.    Dispense:  30 tablet    Refill:  3   No orders of the defined types were placed in this encounter.   Patient instructions: Triglycerides were too high - decrease added sugars,  eliminate trans fats, increase fiber and limit alcohol. Increase fatty fish (salmon, tuna, trout, etc) in the diet - these fish are rich in omega three fatty acids. All these changes together can drop triglycerides by almost 50%.  Good to see you today  Return as needed or in 1 year for next physical.  Follow up plan: Return in about 1 year (around 07/01/2019) for annual exam, prior fasting for blood work, medicare wellness visit.  Ria Bush, MD

## 2018-07-01 NOTE — Assessment & Plan Note (Signed)
Had revision of ectropion scar 05/2018 - upcoming appt next month.

## 2018-07-11 ENCOUNTER — Other Ambulatory Visit: Payer: Self-pay | Admitting: Family Medicine

## 2018-07-13 NOTE — Telephone Encounter (Signed)
Last office visit 07/01/2018 for CPE.  Last refilled 05/17/2018 for #30 with no refills.  Next Appt: 07/04/2019 for CPE.

## 2018-07-13 NOTE — Telephone Encounter (Signed)
ERROR

## 2018-07-13 NOTE — Telephone Encounter (Signed)
plz notify this was E prescribed 

## 2018-07-13 NOTE — Telephone Encounter (Signed)
Patient left a voicemail requesting a refill on his Temazepam. Patient stated that the pharmacy has sent the request over twice.

## 2018-07-14 NOTE — Telephone Encounter (Signed)
Attempted to contact pt.  No answer.  No vm.  Need to notify pt temazepam refill was sent to pharmacy.

## 2018-07-15 NOTE — Telephone Encounter (Signed)
Left detailed message on home number for patient advising him of refill been sent in. Ok per dpr on file

## 2018-07-28 DIAGNOSIS — H02834 Dermatochalasis of left upper eyelid: Secondary | ICD-10-CM | POA: Diagnosis not present

## 2018-07-28 DIAGNOSIS — Z09 Encounter for follow-up examination after completed treatment for conditions other than malignant neoplasm: Secondary | ICD-10-CM | POA: Diagnosis not present

## 2018-07-28 DIAGNOSIS — H02831 Dermatochalasis of right upper eyelid: Secondary | ICD-10-CM | POA: Diagnosis not present

## 2018-07-28 DIAGNOSIS — H02413 Mechanical ptosis of bilateral eyelids: Secondary | ICD-10-CM | POA: Diagnosis not present

## 2018-08-08 NOTE — Progress Notes (Signed)
I reviewed health advisor's note, was available for consultation, and agree with documentation and plan.  

## 2018-08-10 ENCOUNTER — Other Ambulatory Visit: Payer: Self-pay | Admitting: *Deleted

## 2018-08-10 NOTE — Patient Outreach (Signed)
HTA HRA follow up call: Unable to make the call, will try another day.  Eulah Pont. Myrtie Neither, MSN, [facility] Gerontological Nurse Practitioner [facility] Care Management [phone]

## 2018-08-13 NOTE — Patient Outreach (Signed)
Initiated chart prep for HTA HRA follow up and did not actually make the call. I will attempt to call him next week.  Eulah Pont. Myrtie Neither, MSN, [facility] Gerontological Nurse Practitioner [facility] Care Management [phone]

## 2018-08-16 ENCOUNTER — Encounter: Payer: Self-pay | Admitting: *Deleted

## 2018-08-16 ENCOUNTER — Other Ambulatory Visit: Payer: Self-pay | Admitting: *Deleted

## 2018-08-16 NOTE — Patient Outreach (Signed)
Mr. Justin Ali returned my call. Confirmed name and DOB. He reports he has prostate ca that has metastasized to the bone and is getting po tx to maintain or stabilize it. He reports if he starts to deteriorate he does not want to have any aggressive treatment. He has a Living Will and HCPOA. We completed a goals of care form today. We discussed palliative care and hospice care. Advised pt he should ask his oncologist to refer him if his condition begins to decline. He has no needs for care management at this time. He was appreciative of the information. Encouraged him to keep my number for future reference.  Eulah Pont. Myrtie Neither, MSN, [facility] Gerontological Nurse Practitioner [facility] Care Management [phone]

## 2018-08-16 NOTE — Patient Outreach (Signed)
HTA HRA follow up call unsuccessful. Left a message and request for a return call.  Eulah Pont. Myrtie Neither, MSN, [facility] Gerontological Nurse Practitioner [facility] Care Management [phone]

## 2018-08-24 NOTE — Progress Notes (Signed)
[place]  [phone]) [phone] Fax:(336) [phone]  ID: Justin Ali OB: August 18, 1940  MR#: [id number]  DTO#:[id number]  Patient Care Team: Ria Bush, MD as PCP - General (Family Medicine) Birder Robson, MD as Referring Physician (Ophthalmology)  CHIEF COMPLAINT: Stage IV prostate cancer with bulky abdominal lymphadenopathy and widespread bony metastasis.  INTERVAL HISTORY: Patient returns to clinic today for repeat laboratory work, further evaluation, and continuation of Xgeva.  He currently feels well and is asymptomatic.  He continues to tolerate Zytiga and prednisone without significant side effects. He has no neurologic complaints.  He denies any recent fevers or illnesses.  He denies any chest pain, shortness of breath, cough, or hemoptysis.  He denies any nausea, vomiting, constipation, or diarrhea. He has no urinary complaints.  Patient feels at his baseline offers no specific complaints today.  REVIEW OF SYSTEMS:   Review of Systems  Constitutional: Negative.  Negative for fever, malaise/fatigue and weight loss.  Eyes: Negative.  Negative for blurred vision.  Respiratory: Negative.  Negative for cough and shortness of breath.   Cardiovascular: Negative.  Negative for chest pain and leg swelling.  Gastrointestinal: Negative.  Negative for abdominal pain.  Genitourinary: Negative.  Negative for frequency and urgency.  Musculoskeletal: Negative.  Negative for back pain and joint pain.  Skin: Negative.  Negative for rash.  Neurological: Negative.  Negative for dizziness, sensory change, focal weakness and weakness.  Psychiatric/Behavioral: Negative.  The patient is not nervous/anxious.     As per HPI. Otherwise, a complete review of systems is negative.  PAST MEDICAL HISTORY: Past Medical History:  Diagnosis Date  . 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 CAD (coronary artery disease), native  coronary artery 2014   By CT lung (04/2013)   . Cataract   . HLD (hyperlipidemia)   . HTN (hypertension)   . Hyperglycemia   . NAFLD (nonalcoholic fatty liver disease) 08/2011   by abd [place] with increased LFTs  . OSA (obstructive sleep apnea)    did not tolerate CPAP  . Other benign neoplasm of connective and other soft tissue of unspecified site    Neurofibroma of lateral periorbital area  . Peripheral neuropathy   . Personal history of colonic adenomas 08/05/2012  . Pneumonia 01/2013   CAP LUL  . Restless legs syndrome (RLS)   . Sleep apnea    no cpap    PAST SURGICAL HISTORY: Past Surgical History:  Procedure Laterality Date  . COLONOSCOPY  07/2012   5 adenomas (tubular, TV, serrated), rec rpt 5 months [name])  . COLONOSCOPY  02/2013   residual adenomas, cecal AVM, rec rpt 1 yr [name])  . COLONOSCOPY  03/2014   no residual polyp, cecal AVM, rpt 3-4 yrs [name])  . ECTROPION REPAIR Bilateral 09/29/2017   Procedure: REPAIR OF ECTROPION, EXTENSIVE UPPER AND LOWER;  Surgeon: Karle Starch, MD;  Location: [place];  Service: Ophthalmology;  Laterality: Bilateral;  . ORIF ANKLE FRACTURE  11/29/00   Dr. Tamala Julian  . PTOSIS REPAIR Bilateral 09/29/2017   Procedure: BLEPHAROPTOSIS REPAIR RESECT EX UPPER;  Surgeon: Karle Starch, MD;  Location: [place];  Service: Ophthalmology;  Laterality: Bilateral;  . RECONSTRUCTION OF EYELID Bilateral 05/31/2018  . SKIN CANCER EXCISION  1997   left anterior neck    FAMILY HISTORY: Family History  Problem Relation Age of Onset  . Cancer Father        liver  . Alcohol  abuse Father   . Liver cancer Father   . Cancer Mother        breast  . Breast cancer Mother   . Hypertension Brother        Half-brother  . Coronary artery disease Paternal Aunt        CABG  . Cancer Other        GM; Aunt - breast  . Colon cancer Neg Hx   . Rectal cancer Neg Hx   . Stomach cancer Neg Hx   . Esophageal cancer Neg Hx     ADVANCED  DIRECTIVES (Y/N):  N  HEALTH MAINTENANCE: Social History   Tobacco Use  . Smoking status: Former Smoker    Types: Cigarettes, Cigars    Last attempt to quit: 04/28/2002    Years since quitting: 16.3  . Smokeless tobacco: Never Used  . Tobacco comment: rare cigar  Substance Use Topics  . Alcohol use: Not Currently  . Drug use: No     Colonoscopy:  PAP:  Bone density:  Lipid panel:  No Known Allergies  Current Outpatient Medications  Medication Sig Dispense Refill  . abiraterone acetate (ZYTIGA) 250 MG tablet Take 4 tablets (1,000 mg total) by mouth daily. Take on an empty stomach 1 hour before or 2 hours after a meal 120 tablet 5  . amLODipine (NORVASC) 5 MG tablet Take 1 tablet (5 mg total) by mouth daily. 90 tablet 3  . CALCIUM PO Take 1,200 mg by mouth daily.    . cholecalciferol (VITAMIN D) 1000 UNITS tablet Take 2,000 Units by mouth daily.     Marland Kitchen gabapentin (NEURONTIN) 300 MG capsule Take 1 capsule (300 mg total) by mouth at bedtime. 90 capsule 3  . HYDROcodone-acetaminophen (NORCO/VICODIN) 5-325 MG tablet Take 1 tablet by mouth daily. As needed. 60 tablet 0  . lisinopril (PRINIVIL,ZESTRIL) 5 MG tablet Take 1 tablet (5 mg total) by mouth daily. 90 tablet 3  . Multiple Vitamins-Minerals (CENTRUM SILVER 50+MEN) TABS Take by mouth daily.    . Omega-3 Fatty Acids (FISH OIL) 1000 MG CAPS Take by mouth daily.    . potassium chloride SA (K-DUR,KLOR-CON) 20 MEQ tablet Take 1 tablet (20 mEq total) by mouth daily. 30 tablet 3  . predniSONE (DELTASONE) 5 MG tablet TAKE 1 TABLET BY MOUTH DAILY WITH BREAKFAST. 30 tablet 5  . temazepam (RESTORIL) 30 MG capsule Take 1 capsule (30 mg total) by mouth at bedtime as needed for sleep. 30 capsule 1  . traMADol (ULTRAM) 50 MG tablet Take 1 tablet (50 mg total) by mouth 2 (two) times daily as needed. 30 tablet 3   No current facility-administered medications for this visit.     OBJECTIVE: Vitals:   08/27/18 0938  BP: 128/78  Pulse: 80   Temp: 97.8 F (36.6 C)     Body mass index is 32.91 kg/m.    ECOG FS:0 - Asymptomatic  General: Well-developed, well-nourished, no acute distress. Eyes: Pink conjunctiva, anicteric sclera. HEENT: Normocephalic, moist mucous membranes. Lungs: Clear to auscultation bilaterally. Heart: Regular rate and rhythm. No rubs, murmurs, or gallops. Abdomen: Soft, nontender, nondistended. No organomegaly noted, normoactive bowel sounds. Musculoskeletal: No edema, cyanosis, or clubbing. Neuro: Alert, answering all questions appropriately. Cranial nerves grossly intact. Skin: No rashes or petechiae noted. Psych: Normal affect.  LAB RESULTS:  Lab Results  Component Value Date   NA 143 08/27/2018   K 4.0 08/27/2018   CL 110 08/27/2018   CO2 27 08/27/2018   GLUCOSE  110 (H) 08/27/2018   BUN 14 08/27/2018   CREATININE 0.92 08/27/2018   CALCIUM 9.1 08/27/2018   PROT 6.5 08/27/2018   ALBUMIN 3.9 08/27/2018   AST 31 08/27/2018   ALT 37 08/27/2018   ALKPHOS 53 08/27/2018   BILITOT 0.6 08/27/2018   GFRNONAA >60 08/27/2018   GFRAA >60 08/27/2018    Lab Results  Component Value Date   WBC 4.8 08/27/2018   NEUTROABS 2.7 08/27/2018   HGB 13.0 08/27/2018   HCT 38.3 (L) 08/27/2018   MCV 91.0 08/27/2018   PLT 171 08/27/2018     STUDIES: No results found.  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   PLAN:    1. 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 Prostate biopsy results revealed Gleason's 8 (4+4) adenocarcinoma.  His most recent bone scan on January 15, 2017 revealed improvement of his bony metastasis.  Patient's most recent PSA has plateaued and is now 0.61. Today's result is pending. Continue Zytiga and prednisone as prescribed until intolerable side effects for  definitive progression of disease.  Proceed with Xgeva today.  Return to clinic in 3 months for further evaluation and continuation of treatment.   2. Hypocalcemia: Resolved.  Continue calcium and vitamin  D supplementation. 3. Pain: Patient does not complain of this today. 4.  Hypokalemia: Resolved.  Continue oral potassium supplementation.  I spent a total of 30 minutes face-to-face with the patient of which greater than 50% of the visit was spent in counseling and coordination of care as detailed above.  Patient expressed understanding and was in agreement with this plan. He also understands that He can call clinic at any time with any questions, concerns, or complaints.   Cancer Staging Primary malignant 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 [facility]) Staging form: Prostate, AJCC 8th Edition - Clinical stage from 09/26/2016: Stage IVB (cTX, cN1, cM1c, PSA: 193) - Signed by Lloyd Huger, MD on 09/26/2016   Lloyd Huger, MD   08/27/2018 12:24 PM

## 2018-08-26 ENCOUNTER — Other Ambulatory Visit: Payer: Self-pay

## 2018-08-27 ENCOUNTER — Other Ambulatory Visit: Payer: Self-pay

## 2018-08-27 ENCOUNTER — Inpatient Hospital Stay: Payer: No Typology Code available for payment source

## 2018-08-27 ENCOUNTER — Inpatient Hospital Stay: Payer: No Typology Code available for payment source | Attending: Oncology | Admitting: Oncology

## 2018-08-27 ENCOUNTER — Telehealth: Payer: Self-pay | Admitting: Pharmacist

## 2018-08-27 ENCOUNTER — Encounter: Payer: Self-pay | Admitting: Oncology

## 2018-08-27 VITALS — BP 128/78 | HR 80 | Temp 97.8°F | Ht 66.5 in | Wt 207.0 lb

## 2018-08-27 DIAGNOSIS — C61 Malignant neoplasm of prostate: Secondary | ICD-10-CM | POA: Insufficient documentation

## 2018-08-27 DIAGNOSIS — Z808 Family history of malignant neoplasm of other organs or systems: Secondary | ICD-10-CM

## 2018-08-27 DIAGNOSIS — Z87891 Personal history of nicotine dependence: Secondary | ICD-10-CM

## 2018-08-27 DIAGNOSIS — C7951 Secondary malignant neoplasm of bone: Secondary | ICD-10-CM | POA: Insufficient documentation

## 2018-08-27 DIAGNOSIS — Z5111 Encounter for antineoplastic chemotherapy: Secondary | ICD-10-CM | POA: Diagnosis not present

## 2018-08-27 DIAGNOSIS — Z79899 Other long term (current) drug therapy: Secondary | ICD-10-CM | POA: Diagnosis not present

## 2018-08-27 DIAGNOSIS — Z803 Family history of malignant neoplasm of breast: Secondary | ICD-10-CM

## 2018-08-27 LAB — CBC WITH DIFFERENTIAL/PLATELET
Abs Immature Granulocytes: 0.02 10*3/uL (ref 0.00–0.07)
Basophils Absolute: 0 10*3/uL (ref 0.0–0.1)
Basophils Relative: 1 %
Eosinophils Absolute: 0.2 10*3/uL (ref 0.0–0.5)
Eosinophils Relative: 4 %
HCT: 38.3 % — ABNORMAL LOW (ref 39.0–52.0)
Hemoglobin: 13 g/dL (ref 13.0–17.0)
Immature Granulocytes: 0 %
Lymphocytes Relative: 31 %
Lymphs Abs: 1.5 10*3/uL (ref 0.7–4.0)
MCH: 30.9 pg (ref 26.0–34.0)
MCHC: 33.9 g/dL (ref 30.0–36.0)
MCV: 91 fL (ref 80.0–100.0)
Monocytes Absolute: 0.4 10*3/uL (ref 0.1–1.0)
Monocytes Relative: 8 %
Neutro Abs: 2.7 10*3/uL (ref 1.7–7.7)
Neutrophils Relative %: 56 %
Platelets: 171 10*3/uL (ref 150–400)
RBC: 4.21 MIL/uL — ABNORMAL LOW (ref 4.22–5.81)
RDW: 13.2 % (ref 11.5–15.5)
WBC: 4.8 10*3/uL (ref 4.0–10.5)
nRBC: 0 % (ref 0.0–0.2)

## 2018-08-27 LAB — COMPREHENSIVE METABOLIC PANEL
ALT: 37 U/L (ref 0–44)
AST: 31 U/L (ref 15–41)
Albumin: 3.9 g/dL (ref 3.5–5.0)
Alkaline Phosphatase: 53 U/L (ref 38–126)
Anion gap: 6 (ref 5–15)
BUN: 14 mg/dL (ref 8–23)
CO2: 27 mmol/L (ref 22–32)
Calcium: 9.1 mg/dL (ref 8.9–10.3)
Chloride: 110 mmol/L (ref 98–111)
Creatinine, Ser: 0.92 mg/dL (ref 0.61–1.24)
GFR calc Af Amer: >60 mL/min (ref >60)
GFR calc non Af Amer: >60 mL/min (ref >60)
Glucose, Bld: 110 mg/dL — ABNORMAL HIGH (ref 70–99)
Potassium: 4 mmol/L (ref 3.5–5.1)
Sodium: 143 mmol/L (ref 135–145)
Total Bilirubin: 0.6 mg/dL (ref 0.3–1.2)
Total Protein: 6.5 g/dL (ref 6.5–8.1)

## 2018-08-27 LAB — PSA: Prostatic Specific Antigen: 0.78 ng/mL (ref 0.00–4.00)

## 2018-08-27 MED ORDER — DENOSUMAB 120 MG/1.7ML ~~LOC~~ SOLN
120.0000 mg | Freq: Once | SUBCUTANEOUS | Status: AC
  Administered 2018-08-27: 120 mg via SUBCUTANEOUS
  Filled 2018-08-27: qty 1.7

## 2018-08-27 MED ORDER — ABIRATERONE ACETATE 250 MG PO TABS: 1000.0000 mg | ORAL_TABLET | Freq: Every day | ORAL | 5 refills | Status: DC

## 2018-08-27 NOTE — Telephone Encounter (Signed)
Oral Chemotherapy Pharmacist Encounter   Zytiga refill faxed to the [place] for Mr. Haile. Called Mr. Juncaj to let him know the prescription was sent.   Darl Pikes, PharmD, BCPS, [facility] Hematology/Oncology Clinical Pharmacist [facility]/AP [facility] [phone]  08/27/2018 12:13 PM

## 2018-08-27 NOTE — Progress Notes (Signed)
Patient stated that he had been doing well with no complaints. 

## 2018-09-03 ENCOUNTER — Encounter: Payer: Self-pay | Admitting: Oncology

## 2018-09-08 ENCOUNTER — Other Ambulatory Visit: Payer: Self-pay | Admitting: Family Medicine

## 2018-09-09 NOTE — Telephone Encounter (Signed)
Eprescribed.

## 2018-09-13 DIAGNOSIS — H53483 Generalized contraction of visual field, bilateral: Secondary | ICD-10-CM | POA: Diagnosis not present

## 2018-10-10 ENCOUNTER — Other Ambulatory Visit: Payer: Self-pay | Admitting: Family Medicine

## 2018-10-11 DIAGNOSIS — H53483 Generalized contraction of visual field, bilateral: Secondary | ICD-10-CM | POA: Diagnosis not present

## 2018-10-11 DIAGNOSIS — H0289 Other specified disorders of eyelid: Secondary | ICD-10-CM | POA: Diagnosis not present

## 2018-10-11 DIAGNOSIS — H0279 Other degenerative disorders of eyelid and periocular area: Secondary | ICD-10-CM | POA: Diagnosis not present

## 2018-10-11 DIAGNOSIS — H57813 Brow ptosis, bilateral: Secondary | ICD-10-CM | POA: Diagnosis not present

## 2018-10-11 DIAGNOSIS — H02834 Dermatochalasis of left upper eyelid: Secondary | ICD-10-CM | POA: Diagnosis not present

## 2018-10-11 DIAGNOSIS — H02831 Dermatochalasis of right upper eyelid: Secondary | ICD-10-CM | POA: Diagnosis not present

## 2018-10-11 DIAGNOSIS — H02413 Mechanical ptosis of bilateral eyelids: Secondary | ICD-10-CM | POA: Diagnosis not present

## 2018-10-11 DIAGNOSIS — H02054 Trichiasis without entropian left upper eyelid: Secondary | ICD-10-CM | POA: Diagnosis not present

## 2018-10-11 DIAGNOSIS — H53453 Other localized visual field defect, bilateral: Secondary | ICD-10-CM | POA: Diagnosis not present

## 2018-10-11 NOTE — Telephone Encounter (Signed)
Eprescribed.

## 2018-10-11 NOTE — Telephone Encounter (Signed)
Name of Medication: Temazepam Name of Pharmacy: [place] or Written Date and Quantity: 09/09/18, #30 Last Office Visit and Type: 07/01/18, CPE Pt 2 Next Office Visit and Type: 07/04/19, CPE Pt 2 Last Controlled Substance Agreement Date: none Last UDS: none

## 2018-11-25 ENCOUNTER — Other Ambulatory Visit: Payer: Self-pay

## 2018-11-26 ENCOUNTER — Other Ambulatory Visit: Payer: Self-pay

## 2018-11-26 ENCOUNTER — Inpatient Hospital Stay: Payer: No Typology Code available for payment source | Attending: Oncology

## 2018-11-26 ENCOUNTER — Inpatient Hospital Stay: Payer: No Typology Code available for payment source

## 2018-11-26 ENCOUNTER — Encounter: Payer: Self-pay | Admitting: Oncology

## 2018-11-26 ENCOUNTER — Inpatient Hospital Stay (HOSPITAL_BASED_OUTPATIENT_CLINIC_OR_DEPARTMENT_OTHER): Payer: No Typology Code available for payment source | Admitting: Oncology

## 2018-11-26 VITALS — BP 133/85 | HR 73 | Temp 98.4°F | Wt 205.3 lb

## 2018-11-26 DIAGNOSIS — C61 Malignant neoplasm of prostate: Secondary | ICD-10-CM

## 2018-11-26 DIAGNOSIS — C7951 Secondary malignant neoplasm of bone: Secondary | ICD-10-CM | POA: Diagnosis not present

## 2018-11-26 LAB — CBC WITH DIFFERENTIAL/PLATELET
Abs Immature Granulocytes: 0.02 10*3/uL (ref 0.00–0.07)
Basophils Absolute: 0 10*3/uL (ref 0.0–0.1)
Basophils Relative: 1 %
Eosinophils Absolute: 0.1 10*3/uL (ref 0.0–0.5)
Eosinophils Relative: 2 %
HCT: 36.9 % — ABNORMAL LOW (ref 39.0–52.0)
Hemoglobin: 12.9 g/dL — ABNORMAL LOW (ref 13.0–17.0)
Immature Granulocytes: 0 %
Lymphocytes Relative: 31 %
Lymphs Abs: 1.8 10*3/uL (ref 0.7–4.0)
MCH: 30.6 pg (ref 26.0–34.0)
MCHC: 35 g/dL (ref 30.0–36.0)
MCV: 87.4 fL (ref 80.0–100.0)
Monocytes Absolute: 0.5 10*3/uL (ref 0.1–1.0)
Monocytes Relative: 8 %
Neutro Abs: 3.3 10*3/uL (ref 1.7–7.7)
Neutrophils Relative %: 58 %
Platelets: 165 10*3/uL (ref 150–400)
RBC: 4.22 MIL/uL (ref 4.22–5.81)
RDW: 12.8 % (ref 11.5–15.5)
WBC: 5.8 10*3/uL (ref 4.0–10.5)
nRBC: 0 % (ref 0.0–0.2)

## 2018-11-26 LAB — COMPREHENSIVE METABOLIC PANEL
ALT: 28 U/L (ref 0–44)
AST: 26 U/L (ref 15–41)
Albumin: 3.9 g/dL (ref 3.5–5.0)
Alkaline Phosphatase: 56 U/L (ref 38–126)
Anion gap: 9 (ref 5–15)
BUN: 13 mg/dL (ref 8–23)
CO2: 25 mmol/L (ref 22–32)
Calcium: 9.2 mg/dL (ref 8.9–10.3)
Chloride: 109 mmol/L (ref 98–111)
Creatinine, Ser: 1.03 mg/dL (ref 0.61–1.24)
GFR calc Af Amer: >60 mL/min (ref >60)
GFR calc non Af Amer: >60 mL/min (ref >60)
Glucose, Bld: 108 mg/dL — ABNORMAL HIGH (ref 70–99)
Potassium: 3.5 mmol/L (ref 3.5–5.1)
Sodium: 143 mmol/L (ref 135–145)
Total Bilirubin: 0.8 mg/dL (ref 0.3–1.2)
Total Protein: 6.3 g/dL — ABNORMAL LOW (ref 6.5–8.1)

## 2018-11-26 LAB — PSA: Prostatic Specific Antigen: 0.78 ng/mL (ref 0.00–4.00)

## 2018-11-26 MED ORDER — DENOSUMAB 120 MG/1.7ML ~~LOC~~ SOLN
120.0000 mg | Freq: Once | SUBCUTANEOUS | Status: AC
  Administered 2018-11-26: 120 mg via SUBCUTANEOUS
  Filled 2018-11-26: qty 1.7

## 2018-11-26 NOTE — Progress Notes (Signed)
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  INTERVAL HISTORY: Patient returns to clinic today for repeat laboratory work, further evaluation and continuation of Xgeva.  He continues to feel well and is asymptomatic.  He continues Zytiga and prednisone without side effects.  He denies neurological complaints, fevers or illnesses, chest pain, shortness of breath, cough or hemoptysis.  He denies nausea, vomiting, constipation or diarrhea.  He has no urinary complaints.   REVIEW OF SYSTEMS:   Review of Systems  Constitutional: Positive for malaise/fatigue. Negative for chills, fever and weight loss.  HENT: Negative for congestion, ear pain and tinnitus.   Eyes: Negative.  Negative for blurred vision and double vision.  Respiratory: Negative.  Negative for cough, sputum production and shortness of breath.   Cardiovascular: Negative.  Negative for chest pain, palpitations and leg swelling.  Gastrointestinal: Negative.  Negative for abdominal pain, constipation, diarrhea, nausea and vomiting.  Genitourinary: Negative for dysuria, frequency and urgency.  Musculoskeletal: Negative for back pain and falls.  Skin: Negative.  Negative for rash.  Neurological: Negative.  Negative for weakness and headaches.  Endo/Heme/Allergies: Negative.  Does not bruise/bleed easily.  Psychiatric/Behavioral: Negative.  Negative for depression. The patient is not nervous/anxious and does not have insomnia.     As per HPI. Otherwise, a complete review of systems is negative.  PAST MEDICAL HISTORY: Past Medical History:  Diagnosis Date  . Angiodysplasia of cecum  03/18/2013   2 mm - non-bleeding 03/18/2013 colonoscopy   . CAD (coronary artery disease), native coronary artery 2014   By CT lung (04/2013)   . Cataract   . HLD (hyperlipidemia)   . HTN (hypertension)   . Hyperglycemia   . NAFLD (nonalcoholic fatty liver disease) 08/2011   by abd [place] with increased LFTs  . OSA (obstructive sleep apnea)    did not tolerate CPAP  . Other benign neoplasm of connective and other soft tissue of unspecified site    Neurofibroma of lateral periorbital area  . Peripheral neuropathy   . Personal history of colonic adenomas 08/05/2012  . Pneumonia 01/2013   CAP LUL  . Restless legs syndrome (RLS)   . Sleep apnea    no cpap    PAST SURGICAL HISTORY: Past Surgical History:  Procedure Laterality Date  . COLONOSCOPY  07/2012   5 adenomas (tubular, TV, serrated), rec rpt 5 months [name])  . COLONOSCOPY  02/2013   residual adenomas, cecal AVM, rec rpt 1 yr [name])  . COLONOSCOPY  03/2014   no residual polyp, cecal AVM, rpt 3-4 yrs [name])  . ECTROPION REPAIR Bilateral 09/29/2017   Procedure: REPAIR OF ECTROPION, EXTENSIVE UPPER AND LOWER;  Surgeon: Karle Starch, MD;  Location: [facility];  Service: Ophthalmology;  Laterality: Bilateral;  . ORIF ANKLE FRACTURE  11/29/00   Dr. Tamala Julian  . PTOSIS REPAIR Bilateral 09/29/2017   Procedure: BLEPHAROPTOSIS REPAIR RESECT EX UPPER;  Surgeon: Karle Starch, MD;  Location: [place] [facility];  Service: Ophthalmology;  Laterality: Bilateral;  . RECONSTRUCTION OF EYELID Bilateral 05/31/2018  . SKIN CANCER EXCISION  1997   left anterior neck    FAMILY HISTORY: Family History  Problem Relation Age of Onset  . Cancer Father  liver  . Alcohol abuse Father   . Liver cancer Father   . Cancer Mother        breast  . Breast cancer Mother   . Hypertension Brother        Half-brother  . Coronary artery disease Paternal Aunt        CABG  . Cancer Other        GM; Aunt - breast  . Colon cancer Neg Hx   .  Rectal cancer Neg Hx   . Stomach cancer Neg Hx   . Esophageal cancer Neg Hx     ADVANCED DIRECTIVES (Y/N):  N  HEALTH MAINTENANCE: Social History   Tobacco Use  . Smoking status: Former Smoker    Types: Cigarettes, Cigars    Quit date: 04/28/2002    Years since quitting: 16.5  . Smokeless tobacco: Never Used  . Tobacco comment: rare cigar  Substance Use Topics  . Alcohol use: Not Currently  . Drug use: No     Colonoscopy:  PAP:  Bone density:  Lipid panel:  No Known Allergies  Current Outpatient Medications  Medication Sig Dispense Refill  . abiraterone acetate (ZYTIGA) 250 MG tablet Take 4 tablets (1,000 mg total) by mouth daily. Take on an empty stomach 1 hour before or 2 hours after a meal 120 tablet 5  . amLODipine (NORVASC) 5 MG tablet Take 1 tablet (5 mg total) by mouth daily. 90 tablet 3  . CALCIUM PO Take 1,200 mg by mouth daily.    . cholecalciferol (VITAMIN D) 1000 UNITS tablet Take 2,000 Units by mouth daily.     Marland Kitchen gabapentin (NEURONTIN) 300 MG capsule Take 1 capsule (300 mg total) by mouth at bedtime. 90 capsule 3  . HYDROcodone-acetaminophen (NORCO/VICODIN) 5-325 MG tablet Take 1 tablet by mouth daily. As needed. 60 tablet 0  . lisinopril (PRINIVIL,ZESTRIL) 5 MG tablet Take 1 tablet (5 mg total) by mouth daily. 90 tablet 3  . Multiple Vitamins-Minerals (CENTRUM SILVER 50+MEN) TABS Take by mouth daily.    . Omega-3 Fatty Acids (FISH OIL) 1000 MG CAPS Take by mouth daily.    . potassium chloride SA (K-DUR,KLOR-CON) 20 MEQ tablet Take 1 tablet (20 mEq total) by mouth daily. 30 tablet 3  . predniSONE (DELTASONE) 5 MG tablet TAKE 1 TABLET BY MOUTH DAILY WITH BREAKFAST. 30 tablet 5  . temazepam (RESTORIL) 30 MG capsule Take 1 capsule (30 mg total) by mouth at bedtime as needed for sleep. 30 capsule 0  . traMADol (ULTRAM) 50 MG tablet Take 1 tablet (50 mg total) by mouth 2 (two) times daily as needed. 30 tablet 3   No current facility-administered medications for this  visit.     OBJECTIVE: Vitals:   11/26/18 1000  BP: 133/85  Pulse: 73  Temp: 98.4 F (36.9 C)     Body mass index is 32.64 kg/m.    ECOG FS:0 - Asymptomatic  Physical Exam Constitutional:      Appearance: Normal appearance. He is obese.  HENT:     Head: Normocephalic and atraumatic.  Eyes:     Pupils: Pupils are equal, round, and reactive to light.  Neck:     Musculoskeletal: Normal range of motion.  Cardiovascular:     Rate and Rhythm: Normal rate and regular rhythm.     Heart sounds: Normal heart sounds. No murmur.  Pulmonary:     Effort: Pulmonary effort is normal.     Breath sounds: Normal breath sounds. No wheezing.  Abdominal:     General: Bowel sounds are normal. There is no distension.     Palpations: Abdomen is soft.     Tenderness: There is no abdominal tenderness.  Musculoskeletal: Normal range of motion.  Skin:    General: Skin is warm and dry.     Findings: No rash.  Neurological:     Mental Status: He is alert and oriented to person, place, and time.  Psychiatric:        Judgment: Judgment normal.     LAB RESULTS:  Lab Results  Component Value Date   NA 143 11/26/2018   K 3.5 11/26/2018   CL 109 11/26/2018   CO2 25 11/26/2018   GLUCOSE 108 (H) 11/26/2018   BUN 13 11/26/2018   CREATININE 1.03 11/26/2018   CALCIUM 9.2 11/26/2018   PROT 6.3 (L) 11/26/2018   ALBUMIN 3.9 11/26/2018   AST 26 11/26/2018   ALT 28 11/26/2018   ALKPHOS 56 11/26/2018   BILITOT 0.8 11/26/2018   GFRNONAA >60 11/26/2018   GFRAA >60 11/26/2018    Lab Results  Component Value Date   WBC 5.8 11/26/2018   NEUTROABS 3.3 11/26/2018   HGB 12.9 (L) 11/26/2018   HCT 36.9 (L) 11/26/2018   MCV 87.4 11/26/2018   PLT 165 11/26/2018     STUDIES: No results found.  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   PLAN:    1. Stage IV prostate cancer with bulky abdominal lymphadenopathy and widespread bony  metastasis: Initial CT and bone scan results on Sep 19, 2016 reviewed independently with widespread metastatic disease. Prostate biopsy results revealed Gleason's 8 (4+4) adenocarcinoma.  His most recent bone scan on January 15, 2017 revealed improvement of his bony metastasis.  His most recent PSA is stable at 0.78.  Continue Zytiga and prednisone as prescribed until intolerable side effects for definitive progression of disease.  Proceed with Xgeva today.  Calcium level acceptable.  Return to cancer center in 3 months for further evaluation and continuation of treatment. 2.  Hypocalcemia: Resolved.  Continue calcium and vitamin D supplementation. 3. Anemia: Mild.  Stable.  I spent a total of 30 minutes face-to-face with the patient of which greater than 50% of the visit was spent in counseling and coordination of care as detailed above.  Patient expressed understanding and was in agreement with this plan. He also understands that He can call clinic at any time with any questions, concerns, or complaints.   Cancer Staging Primary malignant 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 [facility]) Staging form: Prostate, AJCC 8th Edition - Clinical stage from 09/26/2016: Stage IVB (cTX, cN1, cM1c, PSA: 193) - Signed by Lloyd Huger, MD on 09/26/2016   Jacquelin Hawking, NP   11/26/2018 10:24 AM

## 2018-11-26 NOTE — Progress Notes (Signed)
Patient stated that he had been doing well with no complaints. 

## 2018-12-06 ENCOUNTER — Other Ambulatory Visit: Payer: Self-pay | Admitting: Oncology

## 2018-12-06 NOTE — Telephone Encounter (Signed)
...    Ref Range & Units 10d ago 48mo ago 54mo ago  Potassium 3.5 - 5.1 mmol/L 3.5  4.0  3.8 R

## 2018-12-07 ENCOUNTER — Other Ambulatory Visit: Payer: Self-pay | Admitting: Family Medicine

## 2018-12-08 NOTE — Telephone Encounter (Signed)
Eprescribed.

## 2018-12-08 NOTE — Telephone Encounter (Signed)
LOV 07/01/2018 for CPE, future appointment on 07/04/2019. Last filled on 10/11/2018 for #30

## 2019-02-04 ENCOUNTER — Other Ambulatory Visit: Payer: Self-pay | Admitting: Family Medicine

## 2019-02-04 NOTE — Telephone Encounter (Signed)
Last filled 01-07-19 #30 Last OV 07-01-18 Next OV 07-04-19 [facility]

## 2019-02-07 NOTE — Telephone Encounter (Signed)
ERx 

## 2019-02-28 ENCOUNTER — Ambulatory Visit: Payer: Non-veteran care | Admitting: Oncology

## 2019-02-28 ENCOUNTER — Ambulatory Visit: Payer: Non-veteran care

## 2019-02-28 ENCOUNTER — Other Ambulatory Visit: Payer: Non-veteran care

## 2019-03-04 NOTE — Progress Notes (Signed)
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  INTERVAL HISTORY: Patient returns to clinic today for repeat laboratory, further evaluation, and continuation of Xgeva.  He continues to feel well and remains asymptomatic.  He continues to tolerate Zytiga and prednisone without significant side effects. He has no neurologic complaints.  He denies any recent fevers or illnesses.  He denies any chest pain, shortness of breath, cough, or hemoptysis.  He denies any nausea, vomiting, constipation, or diarrhea. He has no urinary complaints.  Patient offers no specific complaints today.  REVIEW OF SYSTEMS:   Review of Systems  Constitutional: Negative.  Negative for fever, malaise/fatigue and weight loss.  Eyes: Negative.  Negative for blurred vision.  Respiratory: Negative.  Negative for cough and shortness of breath.   Cardiovascular: Negative.  Negative for chest pain and leg swelling.  Gastrointestinal: Negative.  Negative for abdominal pain.  Genitourinary: Negative.  Negative for frequency and urgency.  Musculoskeletal: Negative.  Negative for back pain and joint pain.  Skin: Negative.  Negative for rash.  Neurological: Negative.  Negative for dizziness, sensory change, focal weakness and weakness.  Psychiatric/Behavioral: Negative.  The patient is not nervous/anxious.     As per HPI. Otherwise, a complete review of systems is negative.  PAST MEDICAL HISTORY: Past Medical History:  Diagnosis Date  . 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 CAD (coronary artery disease), native coronary artery 2014   By CT lung (04/2013)   . Cataract   . HLD (hyperlipidemia)   . HTN (hypertension)   . Hyperglycemia   . NAFLD (nonalcoholic fatty liver disease) 08/2011   by abd [place] with increased LFTs  . OSA (obstructive sleep apnea)    did not tolerate CPAP  . Other benign neoplasm of connective and other soft tissue of unspecified site    Neurofibroma of lateral periorbital area  . Peripheral neuropathy   . Personal history of colonic adenomas 08/05/2012  . Pneumonia 01/2013   CAP LUL  . Restless legs syndrome (RLS)   . Sleep apnea    no cpap    PAST SURGICAL HISTORY: Past Surgical History:  Procedure Laterality Date  . COLONOSCOPY  07/2012   5 adenomas (tubular, TV, serrated), rec rpt 5 months [name])  . COLONOSCOPY  02/2013   residual adenomas, cecal AVM, rec rpt 1 yr [name])  . COLONOSCOPY  03/2014   no residual polyp, cecal AVM, rpt 3-4 yrs [name])  . ECTROPION REPAIR Bilateral 09/29/2017   Procedure: REPAIR OF ECTROPION, EXTENSIVE UPPER AND LOWER;  Surgeon: Karle Starch, MD;  Location: [place];  Service: Ophthalmology;  Laterality: Bilateral;  . ORIF ANKLE FRACTURE  11/29/00   Dr. Tamala Julian  . PTOSIS REPAIR Bilateral 09/29/2017   Procedure: BLEPHAROPTOSIS REPAIR RESECT EX UPPER;  Surgeon: Karle Starch, MD;  Location: [place];  Service: Ophthalmology;  Laterality: Bilateral;  . RECONSTRUCTION OF EYELID Bilateral 05/31/2018  . SKIN CANCER EXCISION  1997   left anterior neck    FAMILY HISTORY: Family History  Problem Relation Age of Onset  . Cancer Father        liver  . Alcohol abuse Father   .  Liver cancer Father   . Cancer Mother        breast  . Breast cancer Mother   . Hypertension Brother        Half-brother  . Coronary artery disease Paternal Aunt        CABG  . Cancer Other        GM; Aunt - breast  . Colon cancer Neg Hx   . Rectal cancer Neg Hx   . Stomach cancer Neg Hx   . Esophageal cancer Neg Hx     ADVANCED DIRECTIVES (Y/N):  N   HEALTH MAINTENANCE: Social History   Tobacco Use  . Smoking status: Former Smoker    Types: Cigarettes, Cigars    Quit date: 04/28/2002    Years since quitting: 16.8  . Smokeless tobacco: Never Used  . Tobacco comment: rare cigar  Substance Use Topics  . Alcohol use: Not Currently  . Drug use: No     Colonoscopy:  PAP:  Bone density:  Lipid panel:  No Known Allergies  Current Outpatient Medications  Medication Sig Dispense Refill  . abiraterone acetate (ZYTIGA) 250 MG tablet Take 4 tablets (1,000 mg total) by mouth daily. Take on an empty stomach 1 hour before or 2 hours after a meal 120 tablet 5  . amLODipine (NORVASC) 5 MG tablet Take 1 tablet (5 mg total) by mouth daily. 90 tablet 3  . CALCIUM PO Take 1,200 mg by mouth daily.    . cholecalciferol (VITAMIN D) 1000 UNITS tablet Take 2,000 Units by mouth daily.     Marland Kitchen gabapentin (NEURONTIN) 300 MG capsule Take 1 capsule (300 mg total) by mouth at bedtime. 90 capsule 3  . HYDROcodone-acetaminophen (NORCO/VICODIN) 5-325 MG tablet Take 1 tablet by mouth daily. As needed. 60 tablet 0  . lisinopril (PRINIVIL,ZESTRIL) 5 MG tablet Take 1 tablet (5 mg total) by mouth daily. 90 tablet 3  . Multiple Vitamins-Minerals (CENTRUM SILVER 50+MEN) TABS Take by mouth daily.    . Omega-3 Fatty Acids (FISH OIL) 1000 MG CAPS Take by mouth daily.    . potassium chloride SA (K-DUR) 20 MEQ tablet Take 1 tablet (20 mEq total) by mouth daily. 30 tablet 0  . predniSONE (DELTASONE) 5 MG tablet TAKE 1 TABLET BY MOUTH DAILY WITH BREAKFAST. 30 tablet 5  . temazepam (RESTORIL) 30 MG capsule Take 1 capsule (30 mg total) by mouth at bedtime as needed for sleep. 30 capsule 0  . traMADol (ULTRAM) 50 MG tablet Take 1 tablet (50 mg total) by mouth 2 (two) times daily as needed. 30 tablet 3   No current facility-administered medications for this visit.     OBJECTIVE: Vitals:   03/07/19 1022  BP: (!) 152/78  Pulse: 62  Resp: 16  Temp: 99.1 F (37.3 C)  SpO2:  96%     Body mass index is 32.08 kg/m.    ECOG FS:0 - Asymptomatic  General: Well-developed, well-nourished, no acute distress. Eyes: Pink conjunctiva, anicteric sclera. HEENT: Normocephalic, moist mucous membranes. Lungs: Clear to auscultation bilaterally. Heart: Regular rate and rhythm. No rubs, murmurs, or gallops. Abdomen: Soft, nontender, nondistended. No organomegaly noted, normoactive bowel sounds. Musculoskeletal: No edema, cyanosis, or clubbing. Neuro: Alert, answering all questions appropriately. Cranial nerves grossly intact. Skin: No rashes or petechiae noted. Psych: Normal affect.  LAB RESULTS:  Lab Results  Component Value Date   NA 141 03/07/2019   K 3.9 03/07/2019   CL 112 (H) 03/07/2019   CO2 24 03/07/2019  GLUCOSE 111 (H) 03/07/2019   BUN 18 03/07/2019   CREATININE 1.12 03/07/2019   CALCIUM 8.8 (L) 03/07/2019   PROT 6.8 03/07/2019   ALBUMIN 4.0 03/07/2019   AST 23 03/07/2019   ALT 25 03/07/2019   ALKPHOS 54 03/07/2019   BILITOT 0.9 03/07/2019   GFRNONAA >60 03/07/2019   GFRAA >60 03/07/2019    Lab Results  Component Value Date   WBC 4.4 03/07/2019   NEUTROABS 2.8 03/07/2019   HGB 12.6 (L) 03/07/2019   HCT 37.2 (L) 03/07/2019   MCV 90.3 03/07/2019   PLT 165 03/07/2019     STUDIES: No results found.  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  PLAN:    1. 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 Prostate biopsy results revealed Gleason's 8 (4+4) adenocarcinoma.  His most recent bone scan on January 15, 2017 revealed improvement of his bony metastasis.  Patient's most recent PSA on November 26, 2018 was reported 0.78 which is slightly increased from his plateau of 0.5-0.6, today's result is pending.  Continue Zytiga and  prednisone as prescribed until intolerable side effects for definitive progression of disease.  Proceed with Xgeva today.  Return to clinic in 3 months with repeat laboratory, further evaluation, and continuation of treatment. 2. Hypocalcemia: Resolved.  Continue calcium and vitamin  D supplementation.  Proceed with treatment as planned. 3. Pain: Patient does not complain of this today. 4.  Hypokalemia: Resolved.  Continue oral potassium supplementation.  I spent a total of 30 minutes face-to-face with the patient of which greater than 50% of the visit was spent in counseling and coordination of care as detailed above.   Patient expressed understanding and was in agreement with this plan. He also understands that He can call clinic at any time with any questions, concerns, or complaints.   Cancer Staging Primary malignant 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 [facility]) Staging form: Prostate, AJCC 8th Edition - Clinical stage from 09/26/2016: Stage IVB (cTX, cN1, cM1c, PSA: 193) - Signed by Lloyd Huger, MD on 09/26/2016   Lloyd Huger, MD   03/07/2019 11:24 AM

## 2019-03-07 ENCOUNTER — Inpatient Hospital Stay: Payer: No Typology Code available for payment source | Attending: Oncology | Admitting: Oncology

## 2019-03-07 ENCOUNTER — Inpatient Hospital Stay: Payer: No Typology Code available for payment source

## 2019-03-07 ENCOUNTER — Other Ambulatory Visit: Payer: Self-pay

## 2019-03-07 VITALS — BP 152/78 | HR 62 | Temp 99.1°F | Resp 16 | Wt 201.8 lb

## 2019-03-07 DIAGNOSIS — C61 Malignant neoplasm of prostate: Secondary | ICD-10-CM

## 2019-03-07 DIAGNOSIS — C7951 Secondary malignant neoplasm of bone: Secondary | ICD-10-CM | POA: Diagnosis not present

## 2019-03-07 LAB — CBC WITH DIFFERENTIAL/PLATELET
Abs Immature Granulocytes: 0.02 10*3/uL (ref 0.00–0.07)
Basophils Absolute: 0 10*3/uL (ref 0.0–0.1)
Basophils Relative: 1 %
Eosinophils Absolute: 0.1 10*3/uL (ref 0.0–0.5)
Eosinophils Relative: 2 %
HCT: 37.2 % — ABNORMAL LOW (ref 39.0–52.0)
Hemoglobin: 12.6 g/dL — ABNORMAL LOW (ref 13.0–17.0)
Immature Granulocytes: 1 %
Lymphocytes Relative: 28 %
Lymphs Abs: 1.2 10*3/uL (ref 0.7–4.0)
MCH: 30.6 pg (ref 26.0–34.0)
MCHC: 33.9 g/dL (ref 30.0–36.0)
MCV: 90.3 fL (ref 80.0–100.0)
Monocytes Absolute: 0.3 10*3/uL (ref 0.1–1.0)
Monocytes Relative: 6 %
Neutro Abs: 2.8 10*3/uL (ref 1.7–7.7)
Neutrophils Relative %: 62 %
Platelets: 165 10*3/uL (ref 150–400)
RBC: 4.12 MIL/uL — ABNORMAL LOW (ref 4.22–5.81)
RDW: 13.2 % (ref 11.5–15.5)
WBC: 4.4 10*3/uL (ref 4.0–10.5)
nRBC: 0 % (ref 0.0–0.2)

## 2019-03-07 LAB — COMPREHENSIVE METABOLIC PANEL
ALT: 25 U/L (ref 0–44)
AST: 23 U/L (ref 15–41)
Albumin: 4 g/dL (ref 3.5–5.0)
Alkaline Phosphatase: 54 U/L (ref 38–126)
Anion gap: 5 (ref 5–15)
BUN: 18 mg/dL (ref 8–23)
CO2: 24 mmol/L (ref 22–32)
Calcium: 8.8 mg/dL — ABNORMAL LOW (ref 8.9–10.3)
Chloride: 112 mmol/L — ABNORMAL HIGH (ref 98–111)
Creatinine, Ser: 1.12 mg/dL (ref 0.61–1.24)
GFR calc Af Amer: >60 mL/min (ref >60)
GFR calc non Af Amer: >60 mL/min (ref >60)
Glucose, Bld: 111 mg/dL — ABNORMAL HIGH (ref 70–99)
Potassium: 3.9 mmol/L (ref 3.5–5.1)
Sodium: 141 mmol/L (ref 135–145)
Total Bilirubin: 0.9 mg/dL (ref 0.3–1.2)
Total Protein: 6.8 g/dL (ref 6.5–8.1)

## 2019-03-07 LAB — PSA: Prostatic Specific Antigen: 0.76 ng/mL (ref 0.00–4.00)

## 2019-03-07 MED ORDER — DENOSUMAB 120 MG/1.7ML ~~LOC~~ SOLN
120.0000 mg | Freq: Once | SUBCUTANEOUS | Status: AC
  Administered 2019-03-07: 120 mg via SUBCUTANEOUS
  Filled 2019-03-07: qty 1.7

## 2019-03-08 ENCOUNTER — Other Ambulatory Visit: Payer: Self-pay | Admitting: Oncology

## 2019-03-08 ENCOUNTER — Other Ambulatory Visit: Payer: Self-pay | Admitting: Pharmacist

## 2019-03-08 DIAGNOSIS — C61 Malignant neoplasm of prostate: Secondary | ICD-10-CM

## 2019-03-08 MED ORDER — ABIRATERONE ACETATE 250 MG PO TABS: 1000.0000 mg | ORAL_TABLET | Freq: Every day | ORAL | 5 refills | Status: DC

## 2019-03-08 MED ORDER — PREDNISONE 5 MG PO TABS: 5.0000 mg | ORAL_TABLET | Freq: Every day | ORAL | 5 refills | Status: DC

## 2019-04-05 ENCOUNTER — Other Ambulatory Visit: Payer: Self-pay | Admitting: Family Medicine

## 2019-04-05 NOTE — Telephone Encounter (Signed)
Name of Medication: Temazepam Name of Pharmacy: [facility] or Written Date and Quantity: 03/09/19, #30 Last Office Visit and Type: 07/01/18, CPE prt 2 Next Office Visit and Type: 07/04/19, CPE prt 2 Last Controlled Substance Agreement Date: none Last UDS:  none

## 2019-04-06 NOTE — Telephone Encounter (Signed)
ERx 

## 2019-04-18 ENCOUNTER — Other Ambulatory Visit: Payer: Self-pay | Admitting: Family Medicine

## 2019-04-18 NOTE — Telephone Encounter (Signed)
Name of Medication: Tramadol Name of Pharmacy: [facility] or Written Date and Quantity: 07/01/18, #30 Last Office Visit and Type: 07/01/18, CPE prt 2 Next Office Visit and Type: 07/04/19, CPE prt 2 Last Controlled Substance Agreement Date: none Last UDS:  none

## 2019-04-20 NOTE — Telephone Encounter (Signed)
ERx 

## 2019-05-08 ENCOUNTER — Other Ambulatory Visit: Payer: Self-pay | Admitting: Family Medicine

## 2019-05-09 NOTE — Telephone Encounter (Signed)
Name of Medication: Temazepam Name of Pharmacy: [facility] or Written Date and Quantity: 04/06/19, #30 Last Office Visit and Type: 07/01/18, CPE prt 2 Next Office Visit and Type: 07/04/19, CPE prt 2 Last Controlled Substance Agreement Date: none Last UDS: none

## 2019-05-10 NOTE — Telephone Encounter (Signed)
Pt called checking on rx He will run out today

## 2019-05-10 NOTE — Telephone Encounter (Signed)
plz notify sent in. 

## 2019-05-11 NOTE — Telephone Encounter (Signed)
Notified pt as instructed by phone.

## 2019-06-03 NOTE — Progress Notes (Signed)
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  INTERVAL HISTORY: Patient returns to clinic today for repeat laboratory work, routine 37-month evaluation, and continuation of Xgeva.  He continues to tolerate Zytiga and prednisone well without significant side effects.  He currently feels well and is asymptomatic. He has no neurologic complaints.  He has a good appetite and denies weight loss.  He denies any recent fevers or illnesses.  He denies any chest pain, shortness of breath, cough, or hemoptysis.  He denies any nausea, vomiting, constipation, or diarrhea. He has no urinary complaints.  Patient offers no specific complaints today.  REVIEW OF SYSTEMS:   Review of Systems  Constitutional: Negative.  Negative for fever, malaise/fatigue and weight loss.  Eyes: Negative.  Negative for blurred vision.  Respiratory: Negative.  Negative for cough and shortness of breath.   Cardiovascular: Negative.  Negative for chest pain and leg swelling.  Gastrointestinal: Negative.  Negative for abdominal pain.  Genitourinary: Negative.  Negative for frequency and urgency.  Musculoskeletal: Negative.  Negative for back pain and joint pain.  Skin: Negative.  Negative for rash.  Neurological: Negative.  Negative for dizziness, sensory change, focal weakness and weakness.  Psychiatric/Behavioral: Negative.  The patient is not nervous/anxious.     As per HPI. Otherwise, a complete review of systems is negative.  PAST MEDICAL HISTORY: Past Medical History:  Diagnosis Date  . 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  CAD (coronary artery disease), native coronary artery 2014   By CT lung (04/2013)   . Cataract   . HLD (hyperlipidemia)   . HTN (hypertension)   . Hyperglycemia   . NAFLD (nonalcoholic fatty liver disease) 08/2011   by abd [place] with increased LFTs  . OSA (obstructive sleep apnea)    did not tolerate CPAP  . Other benign neoplasm of connective and other soft tissue of unspecified site    Neurofibroma of lateral periorbital area  . Peripheral neuropathy   . Personal history of colonic adenomas 08/05/2012  . Pneumonia 01/2013   CAP LUL  . Restless legs syndrome (RLS)   . Sleep apnea    no cpap    PAST SURGICAL HISTORY: Past Surgical History:  Procedure Laterality Date  . COLONOSCOPY  07/2012   5 adenomas (tubular, TV, serrated), rec rpt 5 months [name])  . COLONOSCOPY  02/2013   residual adenomas, cecal AVM, rec rpt 1 yr [name])  . COLONOSCOPY  03/2014   no residual polyp, cecal AVM, rpt 3-4 yrs [name])  . ECTROPION REPAIR Bilateral 09/29/2017   Procedure: REPAIR OF ECTROPION, EXTENSIVE UPPER AND LOWER;  Surgeon: Karle Starch, MD;  Location: [place];  Service: Ophthalmology;  Laterality: Bilateral;  . ORIF ANKLE FRACTURE  11/29/00   Dr. Tamala Julian  . PTOSIS REPAIR Bilateral 09/29/2017   Procedure: BLEPHAROPTOSIS REPAIR RESECT EX UPPER;  Surgeon: Karle Starch, MD;  Location: [place];  Service: Ophthalmology;  Laterality: Bilateral;  . RECONSTRUCTION OF EYELID Bilateral 05/31/2018  . SKIN CANCER EXCISION  1997   left anterior neck    FAMILY HISTORY: Family History  Problem Relation Age of Onset  . Cancer Father  liver  . Alcohol abuse Father   . Liver cancer Father   . Cancer Mother        breast  . Breast cancer Mother   . Hypertension Brother        Half-brother  . Coronary artery disease Paternal Aunt        CABG  . Cancer Other        GM; Aunt - breast  . Colon cancer Neg Hx   . Rectal cancer Neg Hx   . Stomach cancer Neg Hx   .  Esophageal cancer Neg Hx     ADVANCED DIRECTIVES (Y/N):  N  HEALTH MAINTENANCE: Social History   Tobacco Use  . Smoking status: Former Smoker    Types: Cigarettes, Cigars    Quit date: 04/28/2002    Years since quitting: 17.1  . Smokeless tobacco: Never Used  . Tobacco comment: rare cigar  Substance Use Topics  . Alcohol use: Not Currently  . Drug use: No     Colonoscopy:  PAP:  Bone density:  Lipid panel:  No Known Allergies  Current Outpatient Medications  Medication Sig Dispense Refill  . abiraterone acetate (ZYTIGA) 250 MG tablet Take 4 tablets (1,000 mg total) by mouth daily. Take on an empty stomach 1 hour before or 2 hours after a meal 120 tablet 5  . amLODipine (NORVASC) 5 MG tablet Take 1 tablet (5 mg total) by mouth daily. 90 tablet 3  . CALCIUM PO Take 1,200 mg by mouth daily.    . cholecalciferol (VITAMIN D) 1000 UNITS tablet Take 2,000 Units by mouth daily.     Marland Kitchen gabapentin (NEURONTIN) 300 MG capsule Take 1 capsule (300 mg total) by mouth at bedtime. 90 capsule 3  . HYDROcodone-acetaminophen (NORCO/VICODIN) 5-325 MG tablet Take 1 tablet by mouth daily. As needed. 60 tablet 0  . lisinopril (PRINIVIL,ZESTRIL) 5 MG tablet Take 1 tablet (5 mg total) by mouth daily. 90 tablet 3  . Multiple Vitamins-Minerals (CENTRUM SILVER 50+MEN) TABS Take by mouth daily.    . Omega-3 Fatty Acids (FISH OIL) 1000 MG CAPS Take by mouth daily.    . potassium chloride SA (K-DUR) 20 MEQ tablet Take 1 tablet (20 mEq total) by mouth daily. 30 tablet 0  . predniSONE (DELTASONE) 5 MG tablet Take 1 tablet (5 mg total) by mouth daily with breakfast. 30 tablet 5  . temazepam (RESTORIL) 30 MG capsule Take 1 capsule (30 mg total) by mouth at bedtime as needed for sleep. 30 capsule 0  . traMADol (ULTRAM) 50 MG tablet Take 1 tablet (50 mg total) by mouth 2 (two) times daily as needed. 30 tablet 0   No current facility-administered medications for this visit.    OBJECTIVE: Vitals:   06/08/19  1126  BP: 139/87  Pulse: 71  Resp: 16  Temp: 97.6 F (36.4 C)     Body mass index is 31.75 kg/m.    ECOG FS:0 - Asymptomatic  General: Well-developed, well-nourished, no acute distress. Eyes: Pink conjunctiva, anicteric sclera. HEENT: Normocephalic, moist mucous membranes. Lungs: No audible wheezing or coughing. Heart: Regular rate and rhythm. Abdomen: Soft, nontender, no obvious distention. Musculoskeletal: No edema, cyanosis, or clubbing. Neuro: Alert, answering all questions appropriately. Cranial nerves grossly intact. Skin: No rashes or petechiae noted. Psych: Normal affect.  LAB RESULTS:  Lab Results  Component Value Date   NA 142 06/08/2019   K 3.2 (L) 06/08/2019   CL 105 06/08/2019   CO2 28 06/08/2019   GLUCOSE  110 (H) 06/08/2019   BUN 12 06/08/2019   CREATININE 0.96 06/08/2019   CALCIUM 9.0 06/08/2019   PROT 6.6 06/08/2019   ALBUMIN 3.8 06/08/2019   AST 27 06/08/2019   ALT 24 06/08/2019   ALKPHOS 64 06/08/2019   BILITOT 0.7 06/08/2019   GFRNONAA >60 06/08/2019   GFRAA >60 06/08/2019    Lab Results  Component Value Date   WBC 5.1 06/08/2019   NEUTROABS 3.1 06/08/2019   HGB 12.7 (L) 06/08/2019   HCT 38.2 (L) 06/08/2019   MCV 89.7 06/08/2019   PLT 179 06/08/2019     STUDIES: No results found.  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  PLAN:    1. 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 Prostate biopsy results revealed Gleason's 8 (4+4) adenocarcinoma.  His most recent bone scan on January 15, 2017 revealed improvement of his bony metastasis.  Patient's most recent PSA has trended up slightly to 0.94.  Continue to monitor closely.  Continue Zytiga and prednisone as prescribed until intolerable side effects for definitive  progression of disease.  Proceed with Xgeva today.  Return to clinic in 3 months with repeat laboratory work, further evaluation, and continuation of treatment. 2. Hypocalcemia: Resolved.  Continue calcium and vitamin  D supplementation.  Proceed with treatment as planned. 3. Pain: Mild and intermittent.  Patient was given a refill of his Norco today. 4.  Hypokalemia: Potassium have trended down to 3.2.  Continue oral potassium supplementation as prescribed.  Patient was also previously given dietary recommendations.  I spent a total of 30 minutes reviewing chart data, face-to-face evaluation with the patient, counseling and coordination of care as detailed above.   Patient expressed understanding and was in agreement with this plan. He also understands that He can call clinic at any time with any questions, concerns, or complaints.   Cancer Staging Primary malignant 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 [facility]) Staging form: Prostate, AJCC 8th Edition - Clinical stage from 09/26/2016: Stage IVB (cTX, cN1, cM1c, PSA: 193) - Signed by Lloyd Huger, MD on 09/26/2016   Lloyd Huger, MD   06/10/2019 7:02 AM

## 2019-06-04 ENCOUNTER — Other Ambulatory Visit: Payer: Self-pay | Admitting: Family Medicine

## 2019-06-05 NOTE — Telephone Encounter (Signed)
ERx 

## 2019-06-08 ENCOUNTER — Inpatient Hospital Stay: Payer: No Typology Code available for payment source | Attending: Oncology

## 2019-06-08 ENCOUNTER — Inpatient Hospital Stay (HOSPITAL_BASED_OUTPATIENT_CLINIC_OR_DEPARTMENT_OTHER): Payer: No Typology Code available for payment source | Admitting: Oncology

## 2019-06-08 ENCOUNTER — Other Ambulatory Visit: Payer: Self-pay | Admitting: Emergency Medicine

## 2019-06-08 ENCOUNTER — Other Ambulatory Visit: Payer: Self-pay

## 2019-06-08 ENCOUNTER — Encounter: Payer: Self-pay | Admitting: Oncology

## 2019-06-08 ENCOUNTER — Inpatient Hospital Stay: Payer: No Typology Code available for payment source

## 2019-06-08 VITALS — BP 139/87 | HR 71 | Temp 97.6°F | Resp 16 | Wt 199.7 lb

## 2019-06-08 DIAGNOSIS — C61 Malignant neoplasm of prostate: Secondary | ICD-10-CM | POA: Insufficient documentation

## 2019-06-08 DIAGNOSIS — C7951 Secondary malignant neoplasm of bone: Secondary | ICD-10-CM | POA: Diagnosis not present

## 2019-06-08 DIAGNOSIS — Z5111 Encounter for antineoplastic chemotherapy: Secondary | ICD-10-CM | POA: Insufficient documentation

## 2019-06-08 LAB — CBC WITH DIFFERENTIAL/PLATELET
Abs Immature Granulocytes: 0.05 10*3/uL (ref 0.00–0.07)
Basophils Absolute: 0 10*3/uL (ref 0.0–0.1)
Basophils Relative: 1 %
Eosinophils Absolute: 0.1 10*3/uL (ref 0.0–0.5)
Eosinophils Relative: 2 %
HCT: 38.2 % — ABNORMAL LOW (ref 39.0–52.0)
Hemoglobin: 12.7 g/dL — ABNORMAL LOW (ref 13.0–17.0)
Immature Granulocytes: 1 %
Lymphocytes Relative: 28 %
Lymphs Abs: 1.4 10*3/uL (ref 0.7–4.0)
MCH: 29.8 pg (ref 26.0–34.0)
MCHC: 33.2 g/dL (ref 30.0–36.0)
MCV: 89.7 fL (ref 80.0–100.0)
Monocytes Absolute: 0.4 10*3/uL (ref 0.1–1.0)
Monocytes Relative: 8 %
Neutro Abs: 3.1 10*3/uL (ref 1.7–7.7)
Neutrophils Relative %: 60 %
Platelets: 179 10*3/uL (ref 150–400)
RBC: 4.26 MIL/uL (ref 4.22–5.81)
RDW: 13.1 % (ref 11.5–15.5)
WBC: 5.1 10*3/uL (ref 4.0–10.5)
nRBC: 0 % (ref 0.0–0.2)

## 2019-06-08 LAB — COMPREHENSIVE METABOLIC PANEL
ALT: 24 U/L (ref 0–44)
AST: 27 U/L (ref 15–41)
Albumin: 3.8 g/dL (ref 3.5–5.0)
Alkaline Phosphatase: 64 U/L (ref 38–126)
Anion gap: 9 (ref 5–15)
BUN: 12 mg/dL (ref 8–23)
CO2: 28 mmol/L (ref 22–32)
Calcium: 9 mg/dL (ref 8.9–10.3)
Chloride: 105 mmol/L (ref 98–111)
Creatinine, Ser: 0.96 mg/dL (ref 0.61–1.24)
GFR calc Af Amer: >60 mL/min (ref >60)
GFR calc non Af Amer: >60 mL/min (ref >60)
Glucose, Bld: 110 mg/dL — ABNORMAL HIGH (ref 70–99)
Potassium: 3.2 mmol/L — ABNORMAL LOW (ref 3.5–5.1)
Sodium: 142 mmol/L (ref 135–145)
Total Bilirubin: 0.7 mg/dL (ref 0.3–1.2)
Total Protein: 6.6 g/dL (ref 6.5–8.1)

## 2019-06-08 LAB — PSA: Prostatic Specific Antigen: 0.94 ng/mL (ref 0.00–4.00)

## 2019-06-08 MED ORDER — DENOSUMAB 120 MG/1.7ML ~~LOC~~ SOLN
120.0000 mg | Freq: Once | SUBCUTANEOUS | Status: AC
  Administered 2019-06-08: 120 mg via SUBCUTANEOUS
  Filled 2019-06-08: qty 1.7

## 2019-06-08 MED ORDER — HYDROCODONE-ACETAMINOPHEN 5-325 MG PO TABS: 1.0000 | ORAL_TABLET | Freq: Every day | ORAL | 0 refills | Status: DC

## 2019-06-08 NOTE — Progress Notes (Signed)
Pt comes in reporting hip pain. Reports that occasionally he takes one Norco that Dr. Grayland Ormond gave him back in Sept of 2018, but he has run out, wondering if he could get that refilled. Also reports poor appetite and diarrhea. No other complaints or concerns.

## 2019-06-24 DIAGNOSIS — C61 Malignant neoplasm of prostate: Secondary | ICD-10-CM | POA: Insufficient documentation

## 2019-06-24 DIAGNOSIS — H2513 Age-related nuclear cataract, bilateral: Secondary | ICD-10-CM | POA: Insufficient documentation

## 2019-06-24 DIAGNOSIS — H0220C Unspecified lagophthalmos, bilateral, upper and lower eyelids: Secondary | ICD-10-CM | POA: Insufficient documentation

## 2019-06-28 ENCOUNTER — Other Ambulatory Visit: Payer: Self-pay | Admitting: Family Medicine

## 2019-06-28 DIAGNOSIS — C61 Malignant neoplasm of prostate: Secondary | ICD-10-CM

## 2019-06-28 DIAGNOSIS — E785 Hyperlipidemia, unspecified: Secondary | ICD-10-CM

## 2019-06-28 DIAGNOSIS — C7951 Secondary malignant neoplasm of bone: Secondary | ICD-10-CM

## 2019-06-28 DIAGNOSIS — E876 Hypokalemia: Secondary | ICD-10-CM

## 2019-06-30 ENCOUNTER — Ambulatory Visit (INDEPENDENT_AMBULATORY_CARE_PROVIDER_SITE_OTHER): Payer: PPO

## 2019-06-30 ENCOUNTER — Ambulatory Visit: Payer: Non-veteran care

## 2019-06-30 ENCOUNTER — Other Ambulatory Visit: Payer: Self-pay

## 2019-06-30 ENCOUNTER — Other Ambulatory Visit (INDEPENDENT_AMBULATORY_CARE_PROVIDER_SITE_OTHER): Payer: PPO

## 2019-06-30 DIAGNOSIS — E785 Hyperlipidemia, unspecified: Secondary | ICD-10-CM

## 2019-06-30 DIAGNOSIS — Z0000 Encounter for general adult medical examination without abnormal findings: Secondary | ICD-10-CM | POA: Diagnosis not present

## 2019-06-30 DIAGNOSIS — E876 Hypokalemia: Secondary | ICD-10-CM | POA: Diagnosis not present

## 2019-06-30 LAB — LIPID PANEL
Cholesterol: 150 mg/dL (ref 0–200)
HDL: 25.9 mg/dL — ABNORMAL LOW (ref >39.00)
NonHDL: 123.68
Total CHOL/HDL Ratio: 6
Triglycerides: 227 mg/dL — ABNORMAL HIGH (ref 0.0–149.0)
VLDL: 45.4 mg/dL — ABNORMAL HIGH (ref 0.0–40.0)

## 2019-06-30 LAB — POTASSIUM: Potassium: 3.6 mEq/L (ref 3.5–5.1)

## 2019-06-30 LAB — LDL CHOLESTEROL, DIRECT: Direct LDL: 87 mg/dL

## 2019-06-30 NOTE — Progress Notes (Signed)
PCP notes:  Health Maintenance: Colonoscopy- declined   Abnormal Screenings: none   Patient concerns: None   Nurse concerns: none   Next PCP appt.: 07/04/2019 @ 8:30 am

## 2019-06-30 NOTE — Progress Notes (Signed)
Subjective:   Justin Ali is a 79 y.o. male who presents for Medicare Annual/Subsequent preventive examination.  Review of Systems: N/A   This visit is being conducted through telemedicine via telephone at the nurse health advisor's home address due to the COVID-19 pandemic. This patient has given me verbal consent via doximity to conduct this visit, patient states they are participating from their home address. Patient and myself are on the telephone call. There is no referral for this visit. Some vital signs may be absent or patient reported.    Patient identification: identified by name, DOB, and current address   Cardiac Risk Factors include: advanced age (>56men, >24 women);male gender;hypertension;dyslipidemia     Objective:    Vitals: There were no vitals taken for this visit.  There is no height or weight on file to calculate BMI.  Advanced Directives 06/30/2019 06/08/2019 03/07/2019 11/26/2018 08/27/2018 06/22/2018 05/28/2018  Does Patient Have a Medical Advance Directive? Yes No Yes Yes Yes Yes Yes  Type of Paramedic of [place];Living will - Living will;Healthcare Power of Attorney Living will;Healthcare Power of Attorney Living will;Healthcare Power of [place];Living will [place];Living will  Does patient want to make changes to medical advance directive? - - - No - Patient declined No - Patient declined - No - Patient declined  Copy of [place] in Chart? Yes - validated most recent copy scanned in chart (See row information) - - Yes - validated most recent copy scanned in chart (See row information) Yes - validated most recent copy scanned in chart (See row information) Yes - validated most recent copy scanned in chart (See row information) -  Would patient like information on creating a medical advance directive? - No - Patient declined - - - - -    Tobacco Social History    Tobacco Use  Smoking Status Former Smoker  . Types: Cigarettes, Cigars  . Quit date: 04/28/2002  . Years since quitting: 17.1  Smokeless Tobacco Never Used  Tobacco Comment   rare cigar     Counseling given: Not Answered Comment: rare cigar   Clinical Intake:  Pre-visit preparation completed: Yes  Pain : No/denies pain     Nutritional Risks: Nausea/ vomitting/ diarrhea(diarrhea sometimes) Diabetes: No  How often do you need to have someone help you when you read instructions, pamphlets, or other written materials from your doctor or pharmacy?: 1 - Never What is the last grade level you completed in school?: high school  Interpreter Needed?: No  Information entered by :: CJohnson, LPN  Past Medical History:  Diagnosis Date  . 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 CAD (coronary artery disease), native coronary artery 2014   By CT lung (04/2013)   . Cataract   . HLD (hyperlipidemia)   . HTN (hypertension)   . Hyperglycemia   . NAFLD (nonalcoholic fatty liver disease) 08/2011   by abd [place] with increased LFTs  . OSA (obstructive sleep apnea)    did not tolerate CPAP  . Other benign neoplasm of connective and other soft tissue of unspecified site    Neurofibroma of lateral periorbital area  . Peripheral neuropathy   . Personal history of colonic adenomas 08/05/2012  . Pneumonia 01/2013   CAP LUL  . Restless legs syndrome (RLS)   . Sleep apnea    no cpap   Past Surgical History:  Procedure Laterality  Date  . COLONOSCOPY  07/2012   5 adenomas (tubular, TV, serrated), rec rpt 5 months [name])  . COLONOSCOPY  02/2013   residual adenomas, cecal AVM, rec rpt 1 yr [name])  . COLONOSCOPY  03/2014   no residual polyp, cecal AVM, rpt 3-4 yrs [name])  . ECTROPION REPAIR Bilateral 09/29/2017   Procedure: REPAIR OF ECTROPION, EXTENSIVE UPPER AND LOWER;  Surgeon: Karle Starch, MD;  Location: [place];  Service:  Ophthalmology;  Laterality: Bilateral;  . ORIF ANKLE FRACTURE  11/29/00   Dr. Tamala Julian  . PTOSIS REPAIR Bilateral 09/29/2017   Procedure: BLEPHAROPTOSIS REPAIR RESECT EX UPPER;  Surgeon: Karle Starch, MD;  Location: [place];  Service: Ophthalmology;  Laterality: Bilateral;  . RECONSTRUCTION OF EYELID Bilateral 05/31/2018  . SKIN CANCER EXCISION  1997   left anterior neck   Family History  Problem Relation Age of Onset  . Cancer Father        liver  . Alcohol abuse Father   . Liver cancer Father   . Cancer Mother        breast  . Breast cancer Mother   . Hypertension Brother        Half-brother  . Coronary artery disease Paternal Aunt        CABG  . Cancer Other        GM; Aunt - breast  . Colon cancer Neg Hx   . Rectal cancer Neg Hx   . Stomach cancer Neg Hx   . Esophageal cancer Neg Hx    Social History   Socioeconomic History  . Marital status: Married    Spouse name: Not on file  . Number of children: 2  . Years of education: Not on file  . Highest education level: Not on file  Occupational History  . 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 Smoking status: Former Smoker    Types: Cigarettes, Cigars    Quit date: 04/28/2002    Years since quitting: 17.1  . Smokeless tobacco: Never Used  . Tobacco comment: rare cigar  Substance and Sexual Activity  . Alcohol use: Not Currently  . Drug use: No  . Sexual activity: Not on file  Other Topics Concern  . Not on file  Social History Narrative   Caffeine: occasional coffee   Lives with wife, 1 cat   Occupation: truck driver Adult nurse)   Activity: goes to [employer] 3x/wk   Diet: rare water, lots of sweet tea, some fruits/vegetables   Social Determinants of Health   Financial Resource Strain: Low Risk   . Difficulty of Paying Living Expenses: Not hard at all  Food Insecurity: No Food Insecurity  . Worried About Charity fundraiser in the Last Year: Never true  . Ran Out of Food  in the Last Year: Never true  Transportation Needs: No Transportation Needs  . Lack of Transportation (Medical): No  . Lack of Transportation (Non-Medical): No  Physical Activity: Insufficiently Active  . Days of Exercise per Week: 4 days  . Minutes of Exercise per Session: 30 min  Stress: No Stress Concern Present  . Feeling of Stress : Not at all  Social Connections:   . Frequency of Communication with Friends and Family: Not on file  . Frequency of Social Gatherings with Friends and Family: Not on file  . Attends Religious Services: Not on file  . Active Member of Clubs or Organizations: Not on  file  . Attends Archivist Meetings: Not on file  . Marital Status: Not on file    Outpatient Encounter Medications as of 06/30/2019  Medication Sig  . abiraterone acetate (ZYTIGA) 250 MG tablet Take 4 tablets (1,000 mg total) by mouth daily. Take on an empty stomach 1 hour before or 2 hours after a meal  . amLODipine (NORVASC) 5 MG tablet Take 1 tablet (5 mg total) by mouth daily.  Marland Kitchen CALCIUM PO Take 1,200 mg by mouth daily.  . cholecalciferol (VITAMIN D) 1000 UNITS tablet Take 2,000 Units by mouth daily.   Marland Kitchen gabapentin (NEURONTIN) 300 MG capsule Take 1 capsule (300 mg total) by mouth at bedtime.  Marland Kitchen HYDROcodone-acetaminophen (NORCO/VICODIN) 5-325 MG tablet Take 1 tablet by mouth daily. As needed.  Marland Kitchen lisinopril (PRINIVIL,ZESTRIL) 5 MG tablet Take 1 tablet (5 mg total) by mouth daily.  . Multiple Vitamins-Minerals (CENTRUM SILVER 50+MEN) TABS Take by mouth daily.  . Omega-3 Fatty Acids (FISH OIL) 1000 MG CAPS Take by mouth daily.  . potassium chloride SA (K-DUR) 20 MEQ tablet Take 1 tablet (20 mEq total) by mouth daily.  . predniSONE (DELTASONE) 5 MG tablet Take 1 tablet (5 mg total) by mouth daily with breakfast.  . temazepam (RESTORIL) 30 MG capsule Take 1 capsule (30 mg total) by mouth at bedtime as needed for sleep.  . traMADol (ULTRAM) 50 MG tablet Take 1 tablet (50 mg total) by  mouth 2 (two) times daily as needed.   No facility-administered encounter medications on file as of 06/30/2019.    Activities of Daily Living In your present state of health, do you have any difficulty performing the following activities: 06/30/2019  Hearing? Y  Comment wears hearing aids  Vision? N  Difficulty concentrating or making decisions? N  Walking or climbing stairs? N  Dressing or bathing? N  Doing errands, shopping? N  Preparing Food and eating ? N  Using the Toilet? N  In the past six months, have you accidently leaked urine? N  Do you have problems with loss of bowel control? N  Managing your Medications? N  Managing your Finances? N  Housekeeping or managing your Housekeeping? N  Some recent data might be hidden    Patient Care Team: Ria Bush, MD as PCP - General (Family Medicine) Birder Robson, MD as Referring Physician (Ophthalmology)   Assessment:   This is a routine wellness examination for [place].  Exercise Activities and Dietary recommendations Current Exercise Habits: Structured exercise class, Type of exercise: strength training/weights, Time (Minutes): 30, Frequency (Times/Week): 4, Weekly Exercise (Minutes/Week): 120, Intensity: Moderate, Exercise limited by: None identified  Goals    . Patient Stated     Starting 06/22/18, I will continue to take medications as prescribed.     . Patient Stated     06/30/2019, I will maintain and continue to take medications as prescribed.       Fall Risk Fall Risk  06/30/2019 06/22/2018 06/16/2017 06/13/2016 06/12/2015  Falls in the past year? 1 0 Yes No No  Comment fell on ice - tripped over rocks and injured left knee - -  Number falls in past yr: 0 - 1 - -  Injury with Fall? 0 - Yes - -  Risk for fall due to : Medication side effect - - - -  Follow up Falls evaluation completed;Falls prevention discussed - - - -   Is the patient's home free of loose throw rugs in walkways, pet beds, electrical cords,  etc?   yes      Grab bars in the bathroom? yes      Handrails on the stairs?   yes      Adequate lighting?   yes  Timed Get Up and Go Performed: N/A  Depression Screen PHQ 2/9 Scores 06/30/2019 06/22/2018 06/16/2017 06/13/2016  PHQ - 2 Score 0 0 0 0  PHQ- 9 Score 0 0 0 -    Cognitive Function MMSE - Mini Mental State Exam 06/30/2019 06/22/2018 06/16/2017 06/13/2016  Orientation to time 5 5 5 5   Orientation to Place 5 5 5 5   Registration 3 3 3 3   Attention/ Calculation 5 0 0 0  Recall 3 1 3 3   Recall-comments - unable to recall 2 of 3 words - -  Language- name 2 objects - 0 0 0  Language- repeat 1 1 1 1   Language- follow 3 step command - 3 3 3   Language- read & follow direction - 0 0 0  Write a sentence - 0 0 0  Copy design - 0 0 0  Total score - 18 20 20   Mini Cog  Mini-Cog screen was completed. Maximum score is 22. A value of 0 denotes this part of the MMSE was not completed or the patient failed this part of the Mini-Cog screening.       Immunization History  Administered Date(s) Administered  . Fluad Quad(high Dose 65+) 12/28/2018  . Influenza Whole 01/24/2008, 01/08/2009, 02/08/2013  . Influenza, High Dose Seasonal PF 12/28/2018  . Influenza, Seasonal, Injecte, Preservative Fre 01/04/2014  . Influenza,inj,Quad PF,6+ Mos 01/02/2015, 01/09/2016, 01/02/2017, 01/14/2018  . Pneumococcal Conjugate-13 06/02/2013  . Pneumococcal Polysaccharide-23 06/12/2010  . Td 08/27/2002, 04/28/2010    Qualifies for Shingles Vaccine: Yes  Screening Tests Health Maintenance  Topic Date Due  . DTAP VACCINES (1) 01/19/1941  . COLONOSCOPY  04/27/2020 (Originally 03/22/2017)  . DTaP/Tdap/Td (3 - Tdap) 04/28/2020  . TETANUS/TDAP  04/28/2020  . INFLUENZA VACCINE  Completed  . PNA vac Low Risk Adult  Completed   Cancer Screenings: Lung: Low Dose CT Chest recommended if Age 49-80 years, 30 pack-year currently smoking OR have quit w/in 15years. Patient does not qualify. Colorectal: declined  repeat  Additional Screenings:  Hepatitis C Screening: N/A      Plan:   Patient will maintain and continue medications as prescribed.   I have personally reviewed and noted the following in the patient's chart:   . Medical and social history . Use of alcohol, tobacco or illicit drugs  . Current medications and supplements . Functional ability and status . Nutritional status . Physical activity . Advanced directives . List of other physicians . Hospitalizations, surgeries, and ER visits in previous 12 months . Vitals . Screenings to include cognitive, depression, and falls . Referrals and appointments  In addition, I have reviewed and discussed with patient certain preventive protocols, quality metrics, and best practice recommendations. A written personalized care plan for preventive services as well as general preventive health recommendations were provided to patient.     Andrez Grime, LPN  [id number]

## 2019-06-30 NOTE — Patient Instructions (Signed)
Mr. Justin Ali , Thank you for taking time to come for your Medicare Wellness Visit. I appreciate your ongoing commitment to your health goals. Please review the following plan we discussed and let me know if I can assist you in the future.   Screening recommendations/referrals: Colonoscopy: declined Recommended yearly ophthalmology/optometry visit for glaucoma screening and checkup Recommended yearly dental visit for hygiene and checkup  Vaccinations: Influenza vaccine: Up to date, completed 12/28/2018 Pneumococcal vaccine: Completed series Tdap vaccine: Up to date, completed 04/28/2010 Shingles vaccine: discussed    Advanced directives: copy in chart  Conditions/risks identified: hypertension, hyperlipidemia  Next appointment: 07/04/2019 @ 8:30 am   Preventive Care 78 Years and Older, Male Preventive care refers to lifestyle choices and visits with your health care provider that can promote health and wellness. What does preventive care include?  A yearly physical exam. This is also called an annual well check.  Dental exams once or twice a year.  Routine eye exams. Ask your health care provider how often you should have your eyes checked.  Personal lifestyle choices, including:  Daily care of your teeth and gums.  Regular physical activity.  Eating a healthy diet.  Avoiding tobacco and drug use.  Limiting alcohol use.  Practicing safe sex.  Taking low doses of aspirin every day.  Taking vitamin and mineral supplements as recommended by your health care provider. What happens during an annual well check? The services and screenings done by your health care provider during your annual well check will depend on your age, overall health, lifestyle risk factors, and family history of disease. Counseling  Your health care provider may ask you questions about your:  Alcohol use.  Tobacco use.  Drug use.  Emotional well-being.  Home and relationship well-being.  Sexual  activity.  Eating habits.  History of falls.  Memory and ability to understand (cognition).  Work and work Statistician. Screening  You may have the following tests or measurements:  Height, weight, and BMI.  Blood pressure.  Lipid and cholesterol levels. These may be checked every 5 years, or more frequently if you are over 38 years old.  Skin check.  Lung cancer screening. You may have this screening every year starting at age 51 if you have a 30-pack-year history of smoking and currently smoke or have quit within the past 15 years.  Fecal occult blood test (FOBT) of the stool. You may have this test every year starting at age 58.  Flexible sigmoidoscopy or colonoscopy. You may have a sigmoidoscopy every 5 years or a colonoscopy every 10 years starting at age 66.  Prostate cancer screening. Recommendations will vary depending on your family history and other risks.  Hepatitis C blood test.  Hepatitis B blood test.  Sexually transmitted disease (STD) testing.  Diabetes screening. This is done by checking your blood sugar (glucose) after you have not eaten for a while (fasting). You may have this done every 1-3 years.  Abdominal aortic aneurysm (AAA) screening. You may need this if you are a current or former smoker.  Osteoporosis. You may be screened starting at age 83 if you are at high risk. Talk with your health care provider about your test results, treatment options, and if necessary, the need for more tests. Vaccines  Your health care provider may recommend certain vaccines, such as:  Influenza vaccine. This is recommended every year.  Tetanus, diphtheria, and acellular pertussis (Tdap, Td) vaccine. You may need a Td booster every 10 years.  Zoster  vaccine. You may need this after age 92.  Pneumococcal 13-valent conjugate (PCV13) vaccine. One dose is recommended after age 72.  Pneumococcal polysaccharide (PPSV23) vaccine. One dose is recommended after age  63. Talk to your health care provider about which screenings and vaccines you need and how often you need them. This information is not intended to replace advice given to you by your health care provider. Make sure you discuss any questions you have with your health care provider. Document Released: 05/11/2015 Document Revised: 01/02/2016 Document Reviewed: 02/13/2015 Elsevier Interactive Patient Education  2017 [employer] Prevention in the Home Falls can cause injuries. They can happen to people of all ages. There are many things you can do to make your home safe and to help prevent falls. What can I do on the outside of my home?  Regularly fix the edges of walkways and driveways and fix any cracks.  Remove anything that might make you trip as you walk through a door, such as a raised step or threshold.  Trim any bushes or trees on the path to your home.  Use bright outdoor lighting.  Clear any walking paths of anything that might make someone trip, such as rocks or tools.  Regularly check to see if handrails are loose or broken. Make sure that both sides of any steps have handrails.  Any raised decks and porches should have guardrails on the edges.  Have any leaves, snow, or ice cleared regularly.  Use sand or salt on walking paths during winter.  Clean up any spills in your garage right away. This includes oil or grease spills. What can I do in the bathroom?  Use night lights.  Install grab bars by the toilet and in the tub and shower. Do not use towel bars as grab bars.  Use non-skid mats or decals in the tub or shower.  If you need to sit down in the shower, use a plastic, non-slip stool.  Keep the floor dry. Clean up any water that spills on the floor as soon as it happens.  Remove soap buildup in the tub or shower regularly.  Attach bath mats securely with double-sided non-slip rug tape.  Do not have throw rugs and other things on the floor that can make  you trip. What can I do in the bedroom?  Use night lights.  Make sure that you have a light by your bed that is easy to reach.  Do not use any sheets or blankets that are too big for your bed. They should not hang down onto the floor.  Have a firm chair that has side arms. You can use this for support while you get dressed.  Do not have throw rugs and other things on the floor that can make you trip. What can I do in the kitchen?  Clean up any spills right away.  Avoid walking on wet floors.  Keep items that you use a lot in easy-to-reach places.  If you need to reach something above you, use a strong step stool that has a grab bar.  Keep electrical cords out of the way.  Do not use floor polish or wax that makes floors slippery. If you must use wax, use non-skid floor wax.  Do not have throw rugs and other things on the floor that can make you trip. What can I do with my stairs?  Do not leave any items on the stairs.  Make sure that there are handrails  on both sides of the stairs and use them. Fix handrails that are broken or loose. Make sure that handrails are as long as the stairways.  Check any carpeting to make sure that it is firmly attached to the stairs. Fix any carpet that is loose or worn.  Avoid having throw rugs at the top or bottom of the stairs. If you do have throw rugs, attach them to the floor with carpet tape.  Make sure that you have a light switch at the top of the stairs and the bottom of the stairs. If you do not have them, ask someone to add them for you. What else can I do to help prevent falls?  Wear shoes that:  Do not have high heels.  Have rubber bottoms.  Are comfortable and fit you well.  Are closed at the toe. Do not wear sandals.  If you use a stepladder:  Make sure that it is fully opened. Do not climb a closed stepladder.  Make sure that both sides of the stepladder are locked into place.  Ask someone to hold it for you, if  possible.  Clearly mark and make sure that you can see:  Any grab bars or handrails.  First and last steps.  Where the edge of each step is.  Use tools that help you move around (mobility aids) if they are needed. These include:  Canes.  Walkers.  Scooters.  Crutches.  Turn on the lights when you go into a dark area. Replace any light bulbs as soon as they burn out.  Set up your furniture so you have a clear path. Avoid moving your furniture around.  If any of your floors are uneven, fix them.  If there are any pets around you, be aware of where they are.  Review your medicines with your doctor. Some medicines can make you feel dizzy. This can increase your chance of falling. Ask your doctor what other things that you can do to help prevent falls. This information is not intended to replace advice given to you by your health care provider. Make sure you discuss any questions you have with your health care provider. Document Released: 02/08/2009 Document Revised: 09/20/2015 Document Reviewed: 05/19/2014 Elsevier Interactive Patient Education  2017 [employer].

## 2019-07-04 ENCOUNTER — Other Ambulatory Visit: Payer: Self-pay

## 2019-07-04 ENCOUNTER — Encounter: Payer: Self-pay | Admitting: Family Medicine

## 2019-07-04 ENCOUNTER — Ambulatory Visit (INDEPENDENT_AMBULATORY_CARE_PROVIDER_SITE_OTHER): Payer: PPO | Admitting: Family Medicine

## 2019-07-04 VITALS — BP 120/64 | HR 60 | Temp 97.5°F | Ht 65.5 in | Wt 194.0 lb

## 2019-07-04 DIAGNOSIS — C61 Malignant neoplasm of prostate: Secondary | ICD-10-CM | POA: Diagnosis not present

## 2019-07-04 DIAGNOSIS — G4700 Insomnia, unspecified: Secondary | ICD-10-CM | POA: Diagnosis not present

## 2019-07-04 DIAGNOSIS — Z66 Do not resuscitate: Secondary | ICD-10-CM | POA: Diagnosis not present

## 2019-07-04 DIAGNOSIS — E785 Hyperlipidemia, unspecified: Secondary | ICD-10-CM | POA: Diagnosis not present

## 2019-07-04 DIAGNOSIS — Z0000 Encounter for general adult medical examination without abnormal findings: Secondary | ICD-10-CM | POA: Diagnosis not present

## 2019-07-04 DIAGNOSIS — K5520 Angiodysplasia of colon without hemorrhage: Secondary | ICD-10-CM | POA: Diagnosis not present

## 2019-07-04 DIAGNOSIS — I2510 Atherosclerotic heart disease of native coronary artery without angina pectoris: Secondary | ICD-10-CM | POA: Diagnosis not present

## 2019-07-04 DIAGNOSIS — Z7189 Other specified counseling: Secondary | ICD-10-CM | POA: Diagnosis not present

## 2019-07-04 DIAGNOSIS — I10 Essential (primary) hypertension: Secondary | ICD-10-CM | POA: Diagnosis not present

## 2019-07-04 DIAGNOSIS — Z860100 Personal history of colon polyps, unspecified: Secondary | ICD-10-CM

## 2019-07-04 DIAGNOSIS — C7951 Secondary malignant neoplasm of bone: Secondary | ICD-10-CM

## 2019-07-04 DIAGNOSIS — G4730 Sleep apnea, unspecified: Secondary | ICD-10-CM

## 2019-07-04 DIAGNOSIS — E669 Obesity, unspecified: Secondary | ICD-10-CM | POA: Diagnosis not present

## 2019-07-04 DIAGNOSIS — E66811 Obesity, class 1: Secondary | ICD-10-CM

## 2019-07-04 MED ORDER — TEMAZEPAM 30 MG PO CAPS: 30.0000 mg | ORAL_CAPSULE | Freq: Every evening | ORAL | 0 refills | Status: DC | PRN

## 2019-07-04 NOTE — Assessment & Plan Note (Signed)
Appreciate onc care, sees Q3 mo on treatment.

## 2019-07-04 NOTE — Assessment & Plan Note (Signed)
Preventative protocols reviewed and updated unless pt declined. Discussed healthy diet and lifestyle.  

## 2019-07-04 NOTE — Assessment & Plan Note (Signed)
Has declined further colonoscopy

## 2019-07-04 NOTE — Assessment & Plan Note (Addendum)
3v by imaging study. Not on aspirin or statin.

## 2019-07-04 NOTE — Assessment & Plan Note (Signed)
Chronic, off meds. Reviewed dietary changes to affect improvement in triglycerides The 10-year ASCVD risk score Mikey Bussing DC Brooke Bonito., et al., 2013) is: 31.9%   Values used to calculate the score:     Age: 79 years     Sex: Male     Is Non-Hispanic African American: No     Diabetic: No     Tobacco smoker: No     Systolic Blood Pressure: 123456 mmHg     Is BP treated: Yes     HDL Cholesterol: 25.9 mg/dL     Total Cholesterol: 150 mg/dL

## 2019-07-04 NOTE — Assessment & Plan Note (Signed)
Continues temazepam for sleep.
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
## 2019-07-04 NOTE — Assessment & Plan Note (Signed)
Encouraged healthy diet and lifestyle.  

## 2019-07-04 NOTE — Assessment & Plan Note (Signed)
Advanced directives -Received advanced directives, scanned 05/2015. DNR. No prolonged life support if terminal condition. Does not want feeding tube. No HCPOA form.Wife would be HCPOA. Wants to be [facility] in [facility].

## 2019-07-04 NOTE — Assessment & Plan Note (Signed)
Chronic, stable. Continue current regimen - receives meds through the [place].

## 2019-07-04 NOTE — Assessment & Plan Note (Signed)
Did not tolerate CPAP

## 2019-07-04 NOTE — Assessment & Plan Note (Signed)
Has declined further colonoscopy (last 2015)

## 2019-07-04 NOTE — Assessment & Plan Note (Addendum)
Confirmed with patient, form provided for home.

## 2019-07-04 NOTE — Patient Instructions (Signed)
You are doing well today Temazepam refilled. DNR form filled out today. Return as needed or in 1 year for next physical.  Health Maintenance After Age 78 After age 44, you are at a higher risk for certain long-term diseases and infections as well as injuries from falls. Falls are a major cause of broken bones and head injuries in people who are older than age 99. Getting regular preventive care can help to keep you healthy and well. Preventive care includes getting regular testing and making lifestyle changes as recommended by your health care provider. Talk with your health care provider about:  Which screenings and tests you should have. A screening is a test that checks for a disease when you have no symptoms.  A diet and exercise plan that is right for you. What should I know about screenings and tests to prevent falls? Screening and testing are the best ways to find a health problem early. Early diagnosis and treatment give you the best chance of managing medical conditions that are common after age 19. Certain conditions and lifestyle choices may make you more likely to have a fall. Your health care provider may recommend:  Regular vision checks. Poor vision and conditions such as cataracts can make you more likely to have a fall. If you wear glasses, make sure to get your prescription updated if your vision changes.  Medicine review. Work with your health care provider to regularly review all of the medicines you are taking, including over-the-counter medicines. Ask your health care provider about any side effects that may make you more likely to have a fall. Tell your health care provider if any medicines that you take make you feel dizzy or sleepy.  Osteoporosis screening. Osteoporosis is a condition that causes the bones to get weaker. This can make the bones weak and cause them to break more easily.  Blood pressure screening. Blood pressure changes and medicines to control blood  pressure can make you feel dizzy.  Strength and balance checks. Your health care provider may recommend certain tests to check your strength and balance while standing, walking, or changing positions.  Foot health exam. Foot pain and numbness, as well as not wearing proper footwear, can make you more likely to have a fall.  Depression screening. You may be more likely to have a fall if you have a fear of falling, feel emotionally low, or feel unable to do activities that you used to do.  Alcohol use screening. Using too much alcohol can affect your balance and may make you more likely to have a fall. What actions can I take to lower my risk of falls? General instructions  Talk with your health care provider about your risks for falling. Tell your health care provider if: ? You fall. Be sure to tell your health care provider about all falls, even ones that seem minor. ? You feel dizzy, sleepy, or off-balance.  Take over-the-counter and prescription medicines only as told by your health care provider. These include any supplements.  Eat a healthy diet and maintain a healthy weight. A healthy diet includes low-fat dairy products, low-fat (lean) meats, and fiber from whole grains, beans, and lots of fruits and vegetables. Home safety  Remove any tripping hazards, such as rugs, cords, and clutter.  Install safety equipment such as grab bars in bathrooms and safety rails on stairs.  Keep rooms and walkways well-lit. Activity   Follow a regular exercise program to stay fit. This will help you  maintain your balance. Ask your health care provider what types of exercise are appropriate for you.  If you need a cane or walker, use it as recommended by your health care provider.  Wear supportive shoes that have nonskid soles. Lifestyle  Do not drink alcohol if your health care provider tells you not to drink.  If you drink alcohol, limit how much you have: ? 0-1 drink a day for  women. ? 0-2 drinks a day for men.  Be aware of how much alcohol is in your drink. In the [place] one drink equals one typical bottle of beer (12 oz), one-half glass of wine (5 oz), or one shot of hard liquor (1 oz).  Do not use any products that contain nicotine or tobacco, such as cigarettes and e-cigarettes. If you need help quitting, ask your health care provider. Summary  Having a healthy lifestyle and getting preventive care can help to protect your health and wellness after age 82.  Screening and testing are the best way to find a health problem early and help you avoid having a fall. Early diagnosis and treatment give you the best chance for managing medical conditions that are more common for people who are older than age 105.  Falls are a major cause of broken bones and head injuries in people who are older than age 38. Take precautions to prevent a fall at home.  Work with your health care provider to learn what changes you can make to improve your health and wellness and to prevent falls. This information is not intended to replace advice given to you by your health care provider. Make sure you discuss any questions you have with your health care provider. Document Revised: 08/05/2018 Document Reviewed: 02/25/2017 Elsevier Patient Education  2020 [employer].

## 2019-07-26 ENCOUNTER — Other Ambulatory Visit: Payer: Self-pay | Admitting: Pharmacist

## 2019-07-26 DIAGNOSIS — C61 Malignant neoplasm of prostate: Secondary | ICD-10-CM

## 2019-07-26 MED ORDER — PREDNISONE 5 MG PO TABS: 5.0000 mg | ORAL_TABLET | Freq: Every day | ORAL | 5 refills | Status: DC

## 2019-07-26 MED ORDER — ABIRATERONE ACETATE 250 MG PO TABS: 1000.0000 mg | ORAL_TABLET | Freq: Every day | ORAL | 5 refills | Status: DC

## 2019-08-03 ENCOUNTER — Telehealth: Payer: Self-pay

## 2019-08-03 MED ORDER — TRAZODONE HCL 50 MG PO TABS: 25.0000 mg | ORAL_TABLET | Freq: Every evening | ORAL | 3 refills | Status: DC | PRN

## 2019-08-03 MED ORDER — TEMAZEPAM 15 MG PO CAPS: 15.0000 mg | ORAL_CAPSULE | Freq: Every evening | ORAL | 0 refills | Status: DC | PRN

## 2019-08-03 NOTE — Telephone Encounter (Signed)
[facility] - Client Nonclinical Telephone Record [facility] Client [facility] - Client Client Site [place] Physician Ria Bush - MD Contact Type Call Who Is Calling Patient / Member / Family / Caregiver Caller Name Jeanmichel Forsell Caller Phone Number [phone] Patient Name Justin Ali Patient DOB 24-Dec-1940 Call Type Message Only Information Provided Reason for Call Request for General Office Information Initial Comment Caller states his pills are not working. He needs something better. Additional Comment The medication Cazapam - Don't make the black one's anymore. Disp. Time Disposition Final User 08/03/2019 7:52:41 AM General Information Provided Yes Artis Flock Call Closed By: Artis Flock Transaction Date/Time: 08/03/2019 7:50:33 AM (ET)

## 2019-08-03 NOTE — Telephone Encounter (Signed)
I spoke with pt; pt said he asked the [facility] why his sleep med was changed from black capsule with red tip to a white capsule. Pt was advised by pharmacy that the black cap is no longer manufactured. Pt said he takes his meds at 8:30 pm and is asleep at 10 PM and sleeps all night; since Temazepam capsule changed from black to white cap pt takes med at 8:30 pm and is asleep at 10 PM but wakes up at 2 AM and cannot go back to sleep. Pt request different med for sleep aid. [address]; pt request cb after Dr Darnell Level reviews note and prescribes new med. Pt had annual exam on 07/04/19.

## 2019-08-03 NOTE — Telephone Encounter (Signed)
Given he's been on this medication for a long time, recommend slow taper off temazepam - will drop dose to 15mg  nightly for now, and start trazodone 25-50mg  at night in addition.  Take 15mg  nightly for the next month, update [place] with how he does.

## 2019-08-03 NOTE — Telephone Encounter (Signed)
Spoke with pt relaying Dr. G's message. Pt verbalizes understanding.  

## 2019-08-08 ENCOUNTER — Other Ambulatory Visit: Payer: Self-pay

## 2019-08-08 ENCOUNTER — Encounter: Payer: Self-pay | Admitting: Family Medicine

## 2019-08-08 ENCOUNTER — Ambulatory Visit (INDEPENDENT_AMBULATORY_CARE_PROVIDER_SITE_OTHER): Payer: No Typology Code available for payment source | Admitting: Family Medicine

## 2019-08-08 DIAGNOSIS — F5104 Psychophysiologic insomnia: Secondary | ICD-10-CM | POA: Diagnosis not present

## 2019-08-08 NOTE — Assessment & Plan Note (Signed)
Reviewed chronic insomnia as well as treatment to date. Reviewed dosing of current meds - there was some confusion over this. Temazepam 30mg  + trazodone 50mg  was too strong. Recommend only use temazepam 15mg  for now, may add trazodone 25mg  nightly and if ineffecting for sleep, may slowly taper trazodone to 50mg  at night. Reviewed ultimate goal to taper off temazepam slowly over the next few months. Update with effect over next few weeks.

## 2019-08-08 NOTE — Patient Instructions (Addendum)
Continue 15mg  temazepam. Take with trazodone 25mg  nightly (1/2 tablet). If this doesn't help after 2-3 nights, may increase trazodone to 1 whole tablet (50mg ).  Let us know if this doesn't help sleep.  Our goal is to slowly taper off temazepam over the next month. We will slowly taper up on trazodone as tolerated.   Sleep hygiene checklist: 1. Avoid naps during the day 2. Avoid stimulants such as caffeine and nicotine. Avoid bedtime alcohol (it can speed onset of sleep but the body's metabolism can cause awakenings). 3. All forms of exercise help ensure sound sleep - limit vigorous exercise to morning or late afternoon 4. Avoid food too close to bedtime including chocolate (which contains caffeine) 5. Soak up natural light 6. Establish regular bedtime routine. 7. Associate bed with sleep - avoid TV, computer or phone, reading while in bed. 8. Ensure pleasant, relaxing sleep environment - quiet, dark, cool room.

## 2019-08-08 NOTE — Progress Notes (Signed)
This visit was conducted in person.  BP 120/82 (BP Location: Left Arm, Patient Position: Sitting, Cuff Size: Normal)   Pulse (!) 59   Temp (!) 97.5 F (36.4 C) (Temporal)   Ht 5' 5.5" (1.664 m)   Wt 198 lb 4 oz (89.9 kg)   SpO2 95%   BMI 32.49 kg/m    CC: insomnia Subjective:    Patient ID: Justin Ali, male    DOB: 1940-05-28, 79 y.o.   MRN: [id number]  HPI: Justin Ali is a 79 y.o. male presenting on 08/08/2019 for Insomnia (Still having trouble staying sleep despite new meds.  Took a temazepam 30 mg [old dose] and tramadol and slept all night. Wants to discuss new temazepam dose [15 mg] and trazadone. )   See prior notes for details.   Longstanding sleep maintenance insomnia for the past 6+ years. We've previously reviewed sleep hygiene measures. Doesn't take naps during the day. Continues working out at gym Q[employer]. Previously tried melatonin, advil PM. He also currently takes gabapentin 300mg  at bedtime. Temazepam was started when prostate cancer treatment caused insomnia - this was increased to 30mg  nightly on 12/2016.   Takes med at 8:30pm, falls asleep at 10pm, but now waking up at 2am.   Formulary change for temazepam last month, now getting new pills which patient states have lost effectiveness. He desired to come off this so last week we started taper from 30mg  nightly to 15mg  nightly planned for 1 month then trial off med. We also started trazodone 25-50mg  nightly.   He took trazodone 50mg  together with temazepam 30mg  - slept from 10pm to 7am but had urinary accident overnight - this had never happened.   Known sleep apnea, did not tolerate CPAP.      Relevant past medical, surgical, family and social history reviewed and updated as indicated. Interim medical history since our last visit reviewed. Allergies and medications reviewed and updated. Outpatient Medications Prior to Visit  Medication Sig Dispense Refill  . abiraterone acetate (ZYTIGA) 250 MG tablet  Take 4 tablets (1,000 mg total) by mouth daily. Take on an empty stomach 1 hour before or 2 hours after a meal 120 tablet 5  . amLODipine (NORVASC) 5 MG tablet Take 1 tablet (5 mg total) by mouth daily. 90 tablet 3  . CALCIUM PO Take 1,200 mg by mouth daily.    . cholecalciferol (VITAMIN D) 1000 UNITS tablet Take 2,000 Units by mouth daily.     Marland Kitchen gabapentin (NEURONTIN) 300 MG capsule Take 1 capsule (300 mg total) by mouth at bedtime. 90 capsule 3  . HYDROcodone-acetaminophen (NORCO/VICODIN) 5-325 MG tablet Take 1 tablet by mouth daily. As needed. 60 tablet 0  . lisinopril (PRINIVIL,ZESTRIL) 5 MG tablet Take 1 tablet (5 mg total) by mouth daily. 90 tablet 3  . Multiple Vitamins-Minerals (CENTRUM SILVER 50+MEN) TABS Take by mouth daily.    . Omega-3 Fatty Acids (FISH OIL) 1000 MG CAPS Take by mouth daily.    . predniSONE (DELTASONE) 5 MG tablet Take 1 tablet (5 mg total) by mouth daily with breakfast. 30 tablet 5  . temazepam (RESTORIL) 15 MG capsule Take 1 capsule (15 mg total) by mouth at bedtime as needed for sleep. 30 capsule 0  . traMADol (ULTRAM) 50 MG tablet Take 1 tablet (50 mg total) by mouth 2 (two) times daily as needed. 30 tablet 0  . traZODone (DESYREL) 50 MG tablet Take 0.5-1 tablets (25-50 mg total) by mouth at bedtime as  needed for sleep. 30 tablet 3   No facility-administered medications prior to visit.     Per HPI unless specifically indicated in ROS section below Review of Systems Objective:    BP 120/82 (BP Location: Left Arm, Patient Position: Sitting, Cuff Size: Normal)   Pulse (!) 59   Temp (!) 97.5 F (36.4 C) (Temporal)   Ht 5' 5.5" (1.664 m)   Wt 198 lb 4 oz (89.9 kg)   SpO2 95%   BMI 32.49 kg/m   Wt Readings from Last 3 Encounters:  08/08/19 198 lb 4 oz (89.9 kg)  07/04/19 194 lb (88 kg)  06/08/19 199 lb 11.2 oz (90.6 kg)    Physical Exam Vitals and nursing note reviewed.  Constitutional:      Appearance: Normal appearance. He is not ill-appearing.    Eyes:     Extraocular Movements: Extraocular movements intact.     Pupils: Pupils are equal, round, and reactive to light.     Comments: Cataracts present bilaterally  Cardiovascular:     Rate and Rhythm: Normal rate and regular rhythm.     Pulses: Normal pulses.     Heart sounds: Normal heart sounds. No murmur.  Pulmonary:     Effort: Pulmonary effort is normal. No respiratory distress.     Breath sounds: Normal breath sounds. No wheezing, rhonchi or rales.  Musculoskeletal:     Right lower leg: No edema.     Left lower leg: No edema.  Neurological:     Mental Status: He is alert.  Psychiatric:        Mood and Affect: Mood normal.        Behavior: Behavior normal.       Assessment & Plan:  This visit occurred during the SARS-CoV-2 public health emergency.  Safety protocols were in place, including screening questions prior to the visit, additional usage of staff PPE, and extensive cleaning of exam room while observing appropriate contact time as indicated for disinfecting solutions.   Problem List Items Addressed This Visit    Chronic insomnia    Reviewed chronic insomnia as well as treatment to date. Reviewed dosing of current meds - there was some confusion over this. Temazepam 30mg  + trazodone 50mg  was too strong. Recommend only use temazepam 15mg  for now, may add trazodone 25mg  nightly and if ineffecting for sleep, may slowly taper trazodone to 50mg  at night. Reviewed ultimate goal to taper off temazepam slowly over the next few months. Update with effect over next few weeks.           No orders of the defined types were placed in this encounter.  No orders of the defined types were placed in this encounter.   Patient Instructions  Continue 15mg  temazepam. Take with trazodone 25mg  nightly (1/2 tablet). If this doesn't help after 2-3 nights, may increase trazodone to 1 whole tablet (50mg ).  Let us know if this doesn't help sleep.  Our goal is to slowly taper off  temazepam over the next month. We will slowly taper up on trazodone as tolerated.   Sleep hygiene checklist: 1. Avoid naps during the day 2. Avoid stimulants such as caffeine and nicotine. Avoid bedtime alcohol (it can speed onset of sleep but the body's metabolism can cause awakenings). 3. All forms of exercise help ensure sound sleep - limit vigorous exercise to morning or late afternoon 4. Avoid food too close to bedtime including chocolate (which contains caffeine) 5. Soak up natural light 6. Establish regular bedtime  routine. 7. Associate bed with sleep - avoid TV, computer or phone, reading while in bed. 8. Ensure pleasant, relaxing sleep environment - quiet, dark, cool room.    Follow up plan: Return if symptoms worsen or fail to improve.  Ria Bush, MD

## 2019-08-19 DIAGNOSIS — H318 Other specified disorders of choroid: Secondary | ICD-10-CM | POA: Insufficient documentation

## 2019-08-19 DIAGNOSIS — H25813 Combined forms of age-related cataract, bilateral: Secondary | ICD-10-CM | POA: Insufficient documentation

## 2019-08-29 ENCOUNTER — Other Ambulatory Visit: Payer: Self-pay | Admitting: Family Medicine

## 2019-08-29 NOTE — Telephone Encounter (Signed)
Last filled 08-03-19 #30 Last OV 08-08-19 Next OV 07-12-20 [facility]

## 2019-08-31 NOTE — Telephone Encounter (Signed)
plz notify this was sent in. 

## 2019-08-31 NOTE — Telephone Encounter (Signed)
Patient called to check on refill.Patient called and said he's taking his last pill today.

## 2019-08-31 NOTE — Telephone Encounter (Signed)
Spoke with pt notifying him refill was sent.  Expresses his thanks.

## 2019-09-02 NOTE — Progress Notes (Signed)
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  INTERVAL HISTORY: Patient returns to clinic today for repeat laboratory work, routine 3-month evaluation, and continuation of Xgeva.  He continues to tolerate Zytiga and prednisone without significant side effects.  He currently feels well and is asymptomatic.  He has no neurologic complaints.  He has a good appetite and denies weight loss.  He denies any recent fevers or illnesses.  He denies any chest pain, shortness of breath, cough, or hemoptysis.  He denies any nausea, vomiting, constipation, or diarrhea. He has no urinary complaints.  Patient offers no specific complaints today.  REVIEW OF SYSTEMS:   Review of Systems  Constitutional: Negative.  Negative for fever, malaise/fatigue and weight loss.  Eyes: Negative.  Negative for blurred vision.  Respiratory: Negative.  Negative for cough and shortness of breath.   Cardiovascular: Negative.  Negative for chest pain and leg swelling.  Gastrointestinal: Negative.  Negative for abdominal pain.  Genitourinary: Negative.  Negative for frequency and urgency.  Musculoskeletal: Negative.  Negative for back pain and joint pain.  Skin: Negative.  Negative for rash.  Neurological: Negative.  Negative for dizziness, sensory change, focal weakness and weakness.  Psychiatric/Behavioral: Negative.  The patient is not nervous/anxious.     As per HPI. Otherwise, a complete review of systems is negative.  PAST MEDICAL HISTORY: Past Medical History:  Diagnosis Date  . Angiodysplasia of cecum  03/18/2013   2 mm - non-bleeding 03/18/2013 colonoscopy   . CAD (coronary artery disease), native coronary artery 2014   By CT lung (04/2013)   . Cataract   . HLD (hyperlipidemia)   . HTN (hypertension)   . Hyperglycemia   . NAFLD (nonalcoholic fatty liver disease) 08/2011   by abd [place] with increased LFTs  . OSA (obstructive sleep apnea)    did not tolerate CPAP  . Other benign neoplasm of connective and other soft tissue of unspecified site    Neurofibroma of lateral periorbital area  . Peripheral neuropathy   . Personal history of colonic adenomas 08/05/2012  . Pneumonia 01/2013   CAP LUL  . Restless legs syndrome (RLS)   . Sleep apnea    no cpap    PAST SURGICAL HISTORY: Past Surgical History:  Procedure Laterality Date  . COLONOSCOPY  07/2012   5 adenomas (tubular, TV, serrated), rec rpt 5 months [name])  . COLONOSCOPY  02/2013   residual adenomas, cecal AVM, rec rpt 1 yr [name])  . COLONOSCOPY  03/2014   no residual polyp, cecal AVM, rpt 3-4 yrs [name])  . ECTROPION REPAIR Bilateral 09/29/2017   Procedure: REPAIR OF ECTROPION, EXTENSIVE UPPER AND LOWER;  Surgeon: Karle Starch, MD;  Location: [facility];  Service: Ophthalmology;  Laterality: Bilateral;  . ORIF ANKLE FRACTURE  11/29/00   Dr. Tamala Julian  . PTOSIS REPAIR Bilateral 09/29/2017   Procedure: BLEPHAROPTOSIS REPAIR RESECT EX UPPER;  Surgeon: Karle Starch, MD;  Location: [facility];  Service: Ophthalmology;  Laterality: Bilateral;  . RECONSTRUCTION OF EYELID Bilateral 05/31/2018  . SKIN CANCER EXCISION  1997   left anterior neck    FAMILY HISTORY: Family History  Problem Relation Age of  Onset  . Cancer Father        liver  . Alcohol abuse Father   . Liver cancer Father   . Cancer Mother        breast  . Breast cancer Mother   . Hypertension Brother        Half-brother  . Coronary artery disease Paternal Aunt        CABG  . Cancer Other        GM; Aunt - breast  . Colon cancer Neg Hx   .  Rectal cancer Neg Hx   . Stomach cancer Neg Hx   . Esophageal cancer Neg Hx     ADVANCED DIRECTIVES (Y/N):  N  HEALTH MAINTENANCE: Social History   Tobacco Use  . Smoking status: Former Smoker    Types: Cigarettes, Cigars    Quit date: 04/28/2002    Years since quitting: 17.3  . Smokeless tobacco: Never Used  . Tobacco comment: rare cigar  Substance Use Topics  . Alcohol use: Not Currently  . Drug use: No     Colonoscopy:  PAP:  Bone density:  Lipid panel:  No Known Allergies  Current Outpatient Medications  Medication Sig Dispense Refill  . abiraterone acetate (ZYTIGA) 250 MG tablet Take 4 tablets (1,000 mg total) by mouth daily. Take on an empty stomach 1 hour before or 2 hours after a meal 120 tablet 5  . amLODipine (NORVASC) 5 MG tablet Take 1 tablet (5 mg total) by mouth daily. 90 tablet 3  . CALCIUM PO Take 1,200 mg by mouth daily.    . cholecalciferol (VITAMIN D) 1000 UNITS tablet Take 2,000 Units by mouth daily.     Marland Kitchen gabapentin (NEURONTIN) 300 MG capsule Take 1 capsule (300 mg total) by mouth at bedtime. 90 capsule 3  . HYDROcodone-acetaminophen (NORCO/VICODIN) 5-325 MG tablet Take 1 tablet by mouth daily. As needed. 60 tablet 0  . lisinopril (PRINIVIL,ZESTRIL) 5 MG tablet Take 1 tablet (5 mg total) by mouth daily. 90 tablet 3  . Multiple Vitamins-Minerals (CENTRUM SILVER 50+MEN) TABS Take by mouth daily.    . Omega-3 Fatty Acids (FISH OIL) 1000 MG CAPS Take by mouth daily.    . predniSONE (DELTASONE) 5 MG tablet Take 1 tablet (5 mg total) by mouth daily with breakfast. 30 tablet 5  . temazepam (RESTORIL) 15 MG capsule Take 1 capsule (15 mg total) by mouth at bedtime as needed for sleep. 30 capsule 0  . traMADol (ULTRAM) 50 MG tablet Take 1 tablet (50 mg total) by mouth 2 (two) times daily as needed. 30 tablet 0  . traZODone (DESYREL) 50 MG tablet Take 0.5-1 tablets (25-50 mg total) by mouth at bedtime as needed for sleep. 30 tablet 3   No current  facility-administered medications for this visit.    OBJECTIVE: Vitals:   09/08/19 1505  BP: 102/70  Pulse: 71  Resp: 20  Temp: (!) 97.5 F (36.4 C)  SpO2: 99%     Body mass index is 31.73 kg/m.    ECOG FS:0 - Asymptomatic  General: Well-developed, well-nourished, no acute distress. Eyes: Pink conjunctiva, anicteric sclera. HEENT: Normocephalic, moist mucous membranes. Lungs: No audible wheezing or coughing. Heart: Regular rate and rhythm. Abdomen: Soft, nontender, no obvious distention. Musculoskeletal: No edema, cyanosis, or clubbing. Neuro: Alert, answering all questions appropriately. Cranial nerves grossly intact. Skin: No rashes or petechiae noted. Psych: Normal affect.  LAB RESULTS:  Lab Results  Component Value Date   NA 140 09/08/2019  K 3.5 09/08/2019   CL 105 09/08/2019   CO2 27 09/08/2019   GLUCOSE 109 (H) 09/08/2019   BUN 12 09/08/2019   CREATININE 1.10 09/08/2019   CALCIUM 9.1 09/08/2019   PROT 6.9 09/08/2019   ALBUMIN 4.1 09/08/2019   AST 21 09/08/2019   ALT 16 09/08/2019   ALKPHOS 61 09/08/2019   BILITOT 0.9 09/08/2019   GFRNONAA >60 09/08/2019   GFRAA >60 09/08/2019    Lab Results  Component Value Date   WBC 6.1 09/08/2019   NEUTROABS 3.8 09/08/2019   HGB 13.3 09/08/2019   HCT 38.0 (L) 09/08/2019   MCV 87.2 09/08/2019   PLT 193 09/08/2019     STUDIES: No results found.  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  PLAN:    1. 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 Prostate biopsy results revealed Gleason's 8 (4+4) adenocarcinoma.  His most recent bone scan on January 15, 2017 revealed improvement of his bony metastasis.  Patient's PSA had recently trended up to 0.94, but now is trending back down and is 0.71.   No intervention is needed at this time.  Patient does not require repeat imaging, but would consider in the future if there is suspicion of progression of disease.  Continue Zytiga and prednisone as prescribed until intolerable side effects for definitive progression of disease.  Proceed with Xgeva today.  Return to clinic in 3 months with repeat laboratory work, further evaluation, and continuation of treatment. 2. Hypocalcemia: Resolved.  Continue calcium and vitamin  D supplementation.  Proceed with treatment as planned. 3. Pain: Patient does not complain of this today.   4.  Hypokalemia: Resolved.  Continue oral potassium supplementation as prescribed.  I spent a total of 30 minutes reviewing chart data, face-to-face evaluation with the patient, counseling and coordination of care as detailed above.    Patient expressed understanding and was in agreement with this plan. He also understands that He can call clinic at any time with any questions, concerns, or complaints.   Cancer Staging Primary malignant 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 [facility]) Staging form: Prostate, AJCC 8th Edition - Clinical stage from 09/26/2016: Stage IVB (cTX, cN1, cM1c, PSA: 193) - Signed by Lloyd Huger, MD on 09/26/2016   Lloyd Huger, MD   09/09/2019 12:15 PM

## 2019-09-07 ENCOUNTER — Other Ambulatory Visit: Payer: Self-pay | Admitting: Emergency Medicine

## 2019-09-07 DIAGNOSIS — C61 Malignant neoplasm of prostate: Secondary | ICD-10-CM

## 2019-09-08 ENCOUNTER — Inpatient Hospital Stay (HOSPITAL_BASED_OUTPATIENT_CLINIC_OR_DEPARTMENT_OTHER): Payer: No Typology Code available for payment source | Admitting: Oncology

## 2019-09-08 ENCOUNTER — Inpatient Hospital Stay: Payer: No Typology Code available for payment source | Attending: Oncology

## 2019-09-08 ENCOUNTER — Inpatient Hospital Stay: Payer: No Typology Code available for payment source

## 2019-09-08 ENCOUNTER — Encounter: Payer: Self-pay | Admitting: Oncology

## 2019-09-08 ENCOUNTER — Inpatient Hospital Stay: Payer: No Typology Code available for payment source | Admitting: Pharmacist

## 2019-09-08 ENCOUNTER — Other Ambulatory Visit: Payer: Self-pay

## 2019-09-08 VITALS — BP 102/70 | HR 71 | Temp 97.5°F | Resp 20 | Wt 193.6 lb

## 2019-09-08 DIAGNOSIS — C61 Malignant neoplasm of prostate: Secondary | ICD-10-CM

## 2019-09-08 DIAGNOSIS — C7951 Secondary malignant neoplasm of bone: Secondary | ICD-10-CM

## 2019-09-08 LAB — CBC WITH DIFFERENTIAL/PLATELET
Abs Immature Granulocytes: 0.03 10*3/uL (ref 0.00–0.07)
Basophils Absolute: 0 10*3/uL (ref 0.0–0.1)
Basophils Relative: 1 %
Eosinophils Absolute: 0.1 10*3/uL (ref 0.0–0.5)
Eosinophils Relative: 1 %
HCT: 38 % — ABNORMAL LOW (ref 39.0–52.0)
Hemoglobin: 13.3 g/dL (ref 13.0–17.0)
Immature Granulocytes: 1 %
Lymphocytes Relative: 29 %
Lymphs Abs: 1.8 10*3/uL (ref 0.7–4.0)
MCH: 30.5 pg (ref 26.0–34.0)
MCHC: 35 g/dL (ref 30.0–36.0)
MCV: 87.2 fL (ref 80.0–100.0)
Monocytes Absolute: 0.4 10*3/uL (ref 0.1–1.0)
Monocytes Relative: 6 %
Neutro Abs: 3.8 10*3/uL (ref 1.7–7.7)
Neutrophils Relative %: 62 %
Platelets: 193 10*3/uL (ref 150–400)
RBC: 4.36 MIL/uL (ref 4.22–5.81)
RDW: 13 % (ref 11.5–15.5)
WBC: 6.1 10*3/uL (ref 4.0–10.5)
nRBC: 0 % (ref 0.0–0.2)

## 2019-09-08 LAB — COMPREHENSIVE METABOLIC PANEL
ALT: 16 U/L (ref 0–44)
AST: 21 U/L (ref 15–41)
Albumin: 4.1 g/dL (ref 3.5–5.0)
Alkaline Phosphatase: 61 U/L (ref 38–126)
Anion gap: 8 (ref 5–15)
BUN: 12 mg/dL (ref 8–23)
CO2: 27 mmol/L (ref 22–32)
Calcium: 9.1 mg/dL (ref 8.9–10.3)
Chloride: 105 mmol/L (ref 98–111)
Creatinine, Ser: 1.1 mg/dL (ref 0.61–1.24)
GFR calc Af Amer: >60 mL/min (ref >60)
GFR calc non Af Amer: >60 mL/min (ref >60)
Glucose, Bld: 109 mg/dL — ABNORMAL HIGH (ref 70–99)
Potassium: 3.5 mmol/L (ref 3.5–5.1)
Sodium: 140 mmol/L (ref 135–145)
Total Bilirubin: 0.9 mg/dL (ref 0.3–1.2)
Total Protein: 6.9 g/dL (ref 6.5–8.1)

## 2019-09-08 LAB — PSA: Prostatic Specific Antigen: 0.71 ng/mL (ref 0.00–4.00)

## 2019-09-08 MED ORDER — DENOSUMAB 120 MG/1.7ML ~~LOC~~ SOLN
120.0000 mg | Freq: Once | SUBCUTANEOUS | Status: AC
  Administered 2019-09-08: 120 mg via SUBCUTANEOUS
  Filled 2019-09-08: qty 1.7

## 2019-09-08 NOTE — Progress Notes (Signed)
Patient here today for follow up. Denies pain or concerns at this time. States he would like to get his xgeva shot.

## 2019-09-08 NOTE — Progress Notes (Signed)
Justin Ali  [phone] [phone]  Patient Care Team: Ria Bush, MD as PCP - General (Family Medicine) Birder Robson, MD as Referring Physician (Ophthalmology) Lloyd Huger, MD as Consulting Physician (Oncology)   Name of the patient: Justin Ali  [id number]  [birth year]   Date of visit: 09/08/19  HPI: Patient is a 79 y.o. male with stage IV prostate cancer on Zytiga (abiraterone) and prednisone started in 09/2016.  Reason for Consult: Oral chemotherapy follow-up for abiraterone therapy.  PAST MEDICAL HISTORY: Past Medical History:  Diagnosis Date  . 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 CAD (coronary artery disease), native coronary artery 2014   By CT lung (04/2013)   . Cataract   . HLD (hyperlipidemia)   . HTN (hypertension)   . Hyperglycemia   . NAFLD (nonalcoholic fatty liver disease) 08/2011   by abd [place] with increased LFTs  . OSA (obstructive sleep apnea)    did not tolerate CPAP  . Other benign neoplasm of connective and other soft tissue of unspecified site    Neurofibroma of lateral periorbital area  . Peripheral neuropathy   . Personal history of colonic adenomas 08/05/2012  . Pneumonia 01/2013   CAP LUL  . Restless legs syndrome (RLS)   . Sleep apnea    no cpap    PAST SURGICAL HISTORY:  Past Surgical History:  Procedure Laterality Date  . COLONOSCOPY  07/2012   5 adenomas (tubular, TV, serrated), rec rpt 5 months [name])  . COLONOSCOPY  02/2013   residual adenomas, cecal AVM, rec rpt 1 yr [name])  . COLONOSCOPY  03/2014   no residual polyp, cecal AVM, rpt 3-4 yrs [name])  . ECTROPION REPAIR Bilateral 09/29/2017   Procedure: REPAIR OF ECTROPION, EXTENSIVE UPPER AND LOWER;  Surgeon: Karle Starch, MD;  Location: [facility];  Service: Ophthalmology;  Laterality: Bilateral;  . ORIF ANKLE FRACTURE  11/29/00   Dr.  Tamala Julian  . PTOSIS REPAIR Bilateral 09/29/2017   Procedure: BLEPHAROPTOSIS REPAIR RESECT EX UPPER;  Surgeon: Karle Starch, MD;  Location: [facility];  Service: Ophthalmology;  Laterality: Bilateral;  . RECONSTRUCTION OF EYELID Bilateral 05/31/2018  . SKIN CANCER EXCISION  1997   left anterior neck    HEMATOLOGY/ONCOLOGY HISTORY:  Oncology History   No history exists.    ALLERGIES:  has No Known Allergies.  MEDICATIONS:  Current Outpatient Medications  Medication Sig Dispense Refill  . abiraterone acetate (ZYTIGA) 250 MG tablet Take 4 tablets (1,000 mg total) by mouth daily. Take on an empty stomach 1 hour before or 2 hours after a meal 120 tablet 5  . amLODipine (NORVASC) 5 MG tablet Take 1 tablet (5 mg total) by mouth daily. 90 tablet 3  . CALCIUM PO Take 1,200 mg by mouth daily.    . cholecalciferol (VITAMIN D) 1000 UNITS tablet Take 2,000 Units by mouth daily.     Marland Kitchen gabapentin (NEURONTIN) 300 MG capsule Take 1 capsule (300 mg total) by mouth at bedtime. 90 capsule 3  . HYDROcodone-acetaminophen (NORCO/VICODIN) 5-325 MG tablet Take 1 tablet by mouth daily. As needed. 60 tablet 0  . lisinopril (PRINIVIL,ZESTRIL) 5 MG tablet Take 1 tablet (5 mg total) by mouth daily. 90 tablet 3  . Multiple Vitamins-Minerals (CENTRUM SILVER 50+MEN) TABS Take by mouth daily.    . Omega-3 Fatty Acids (FISH OIL) 1000 MG CAPS Take by mouth daily.    Marland Kitchen  predniSONE (DELTASONE) 5 MG tablet Take 1 tablet (5 mg total) by mouth daily with breakfast. 30 tablet 5  . temazepam (RESTORIL) 15 MG capsule Take 1 capsule (15 mg total) by mouth at bedtime as needed for sleep. 30 capsule 0  . traMADol (ULTRAM) 50 MG tablet Take 1 tablet (50 mg total) by mouth 2 (two) times daily as needed. 30 tablet 0  . traZODone (DESYREL) 50 MG tablet Take 0.5-1 tablets (25-50 mg total) by mouth at bedtime as needed for sleep. 30 tablet 3   No current facility-administered medications for this visit.   Facility-Administered  Medications Ordered in Other Visits  Medication Dose Route Frequency Provider Last Rate Last Admin  . denosumab (XGEVA) injection 120 mg  120 mg Subcutaneous Once Lloyd Huger, MD        VITAL SIGNS: There were no vitals taken for this visit. There were no vitals filed for this visit.  Estimated body mass index is 31.73 kg/m as calculated from the following:   Height as of 08/08/19: 5' 5.5" (1.664 m).   Weight as of an earlier encounter on 09/08/19: 87.8 kg (193 lb 9.6 oz).  LABS: CBC:    Component Value Date/Time   WBC 6.1 09/08/2019 1415   HGB 13.3 09/08/2019 1415   HGB 12.3 (L) 02/16/2013 0523   HCT 38.0 (L) 09/08/2019 1415   HCT 35.6 (L) 02/16/2013 0523   PLT 193 09/08/2019 1415   PLT 166 02/16/2013 0523   MCV 87.2 09/08/2019 1415   MCV 90 02/16/2013 0523   NEUTROABS 3.8 09/08/2019 1415   NEUTROABS 6.8 (H) 02/16/2013 0523   LYMPHSABS 1.8 09/08/2019 1415   LYMPHSABS 1.1 02/16/2013 0523   MONOABS 0.4 09/08/2019 1415   MONOABS 0.8 02/16/2013 0523   EOSABS 0.1 09/08/2019 1415   EOSABS 0.1 02/16/2013 0523   BASOSABS 0.0 09/08/2019 1415   BASOSABS 0.0 02/16/2013 0523   Comprehensive Metabolic Panel:    Component Value Date/Time   NA 140 09/08/2019 1415   NA 142 02/16/2013 0523   K 3.5 09/08/2019 1415   K 4.0 02/16/2013 0523   CL 105 09/08/2019 1415   CL 111 (H) 02/16/2013 0523   CO2 27 09/08/2019 1415   CO2 24 02/16/2013 0523   BUN 12 09/08/2019 1415   BUN 14 02/16/2013 0523   CREATININE 1.10 09/08/2019 1415   CREATININE 1.60 (H) 02/16/2013 0523   GLUCOSE 109 (H) 09/08/2019 1415   GLUCOSE 91 02/16/2013 0523   CALCIUM 9.1 09/08/2019 1415   CALCIUM 8.5 02/16/2013 0523   AST 21 09/08/2019 1415   AST 12 (L) 02/14/2013 1857   ALT 16 09/08/2019 1415   ALT 19 02/14/2013 1857   ALKPHOS 61 09/08/2019 1415   ALKPHOS 133 02/14/2013 1857   BILITOT 0.9 09/08/2019 1415   BILITOT 1.0 02/14/2013 1857   PROT 6.9 09/08/2019 1415   PROT 7.0 02/14/2013 1857   ALBUMIN  4.1 09/08/2019 1415   ALBUMIN 3.3 (L) 02/14/2013 1857    RADIOGRAPHIC STUDIES: No results found.   Assessment and Plan-    Oral Chemotherapy Side Effect/Intolerance: N/A, overall, Justin. Ali is doing well on his abiraterone.  Oral Chemotherapy Adherence: Reports no missed doses so far this month, he takes his abiraterone at 6am then go back to sleep and takes his prednisone with his breakfast.  Medication Access Issues: Patient receives his medication medication from the [place]. He reports since his issues with his [facility] medication shipments in December he has not had any  other issues.  Patient expressed understanding and was in agreement with this plan. He also understands that He can call clinic at any time with any questions, concerns, or complaints.   Thank you for allowing me to participate in the care of this very pleasant patient.   Time Total: 15 mins  Visit consisted of counseling and education on dealing with issues of symptom management in the setting of serious and potentially life-threatening illness.Greater than 50%  of this time was spent counseling and coordinating care related to the above assessment and plan.  Signed by: Darl Pikes, PharmD, BCPS, Salley Slaughter, CPP Hematology/Oncology Clinical Pharmacist Practitioner [facility] [phone]  09/08/2019 3:25 PM

## 2019-09-27 ENCOUNTER — Other Ambulatory Visit: Payer: Self-pay

## 2019-09-27 MED ORDER — TRAZODONE HCL 50 MG PO TABS: 25.0000 mg | ORAL_TABLET | Freq: Every evening | ORAL | 3 refills | Status: DC | PRN

## 2019-09-27 NOTE — Telephone Encounter (Signed)
Name of Medication: Temazepam Name of Pharmacy: [facility] or Written Date and Quantity: 08/31/19, #30 Last Office Visit and Type: 08/08/19, chronic insomnia; 07/04/19, AWV prt 2 Next Office Visit and Type: 07/12/20, AWV prt 2 Last Controlled Substance Agreement Date: none Last UDS: none

## 2019-09-27 NOTE — Telephone Encounter (Signed)
Patient stopped by the office to request a refill on 2 medications, Trazadone, TEMAZEPAM. Would like refill sent to [facility], [place]

## 2019-09-29 ENCOUNTER — Other Ambulatory Visit: Payer: Self-pay | Admitting: Family Medicine

## 2019-09-29 NOTE — Telephone Encounter (Signed)
Patient called about refill request Advised that it has been sent back to Dr Darnell Level for his approval he stated that Dr Darnell Level needed to hurry up and get it sent because he is almost out of medication. He also stated that if it does not get refilled he will just find someone who can fill it

## 2019-09-30 MED ORDER — TEMAZEPAM 15 MG PO CAPS: 15.0000 mg | ORAL_CAPSULE | Freq: Every evening | ORAL | 3 refills | Status: DC | PRN

## 2019-09-30 NOTE — Telephone Encounter (Signed)
This was refilled.

## 2019-09-30 NOTE — Telephone Encounter (Signed)
[facility] called stating that they have not received refill for the Temazepam and it was denied this morning saying that it is a duplicate request...Marland Kitchen please advise

## 2019-12-09 NOTE — Progress Notes (Signed)
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  INTERVAL HISTORY: Patient turns to clinic today for further evaluation and continuation of treatment.  He continues to have problems with insomnia and is asking for a refill of his current medications.  He otherwise feels well and is asymptomatic.  He is tolerating Zytiga, prednisone, and Xgeva without significant side effects. He has no neurologic complaints.  He has a good appetite and denies weight loss.  He denies any recent fevers or illnesses.  He denies any chest pain, shortness of breath, cough, or hemoptysis.  He denies any nausea, vomiting, constipation, or diarrhea. He has no urinary complaints.  Patient offers no further specific complaints today.  REVIEW OF SYSTEMS:   Review of Systems  Constitutional: Negative.  Negative for fever, malaise/fatigue and weight loss.  Eyes: Negative.  Negative for blurred vision.  Respiratory: Negative.  Negative for cough and shortness of breath.   Cardiovascular: Negative.  Negative for chest pain and leg swelling.  Gastrointestinal: Negative.  Negative for abdominal pain.  Genitourinary: Negative.  Negative for frequency and urgency.  Musculoskeletal: Negative.  Negative for back pain and joint pain.  Skin: Negative.  Negative for rash.  Neurological: Negative.  Negative for dizziness, sensory change, focal weakness and weakness.  Psychiatric/Behavioral: The patient has insomnia. The patient is not nervous/anxious.     As per HPI. Otherwise, a complete review of systems is negative.  PAST  MEDICAL HISTORY: Past Medical History:  Diagnosis Date  . 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 CAD (coronary artery disease), native coronary artery 2014   By CT lung (04/2013)   . Cataract   . HLD (hyperlipidemia)   . HTN (hypertension)   . Hyperglycemia   . NAFLD (nonalcoholic fatty liver disease) 08/2011   by abd [place] with increased LFTs  . OSA (obstructive sleep apnea)    did not tolerate CPAP  . Other benign neoplasm of connective and other soft tissue of unspecified site    Neurofibroma of lateral periorbital area  . Peripheral neuropathy   . Personal history of colonic adenomas 08/05/2012  . Pneumonia 01/2013   CAP LUL  . Restless legs syndrome (RLS)   . Sleep apnea    no cpap    PAST SURGICAL HISTORY: Past Surgical History:  Procedure Laterality Date  . COLONOSCOPY  07/2012   5 adenomas (tubular, TV, serrated), rec rpt 5 months [name])  . COLONOSCOPY  02/2013   residual adenomas, cecal AVM, rec rpt 1 yr [name])  . COLONOSCOPY  03/2014   no residual polyp, cecal AVM, rpt 3-4 yrs [name])  . ECTROPION REPAIR Bilateral 09/29/2017   Procedure: REPAIR OF ECTROPION, EXTENSIVE UPPER AND LOWER;  Surgeon: Karle Starch, MD;  Location: [facility];  Service: Ophthalmology;  Laterality: Bilateral;  . ORIF ANKLE FRACTURE  11/29/00   Dr. Tamala Julian  . PTOSIS REPAIR Bilateral 09/29/2017   Procedure: BLEPHAROPTOSIS REPAIR RESECT EX UPPER;  Surgeon: Karle Starch, MD;  Location: [facility];  Service: Ophthalmology;  Laterality: Bilateral;  . RECONSTRUCTION OF EYELID Bilateral 05/31/2018  . [place]  left anterior neck    FAMILY HISTORY: Family History  Problem Relation Age of Onset  . Cancer Father        liver  . Alcohol abuse Father   . Liver cancer Father   . Cancer Mother        breast  . Breast cancer Mother   . Hypertension Brother        Half-brother  . Coronary artery disease Paternal Aunt         CABG  . Cancer Other        GM; Aunt - breast  . Colon cancer Neg Hx   . Rectal cancer Neg Hx   . Stomach cancer Neg Hx   . Esophageal cancer Neg Hx     ADVANCED DIRECTIVES (Y/N):  N  HEALTH MAINTENANCE: Social History   Tobacco Use  . Smoking status: Former Smoker    Types: Cigarettes, Cigars    Quit date: 04/28/2002    Years since quitting: 17.6  . Smokeless tobacco: Never Used  . Tobacco comment: rare cigar  Vaping Use  . Vaping Use: Never used  Substance Use Topics  . Alcohol use: Not Currently  . Drug use: No     Colonoscopy:  PAP:  Bone density:  Lipid panel:  No Known Allergies  Current Outpatient Medications  Medication Sig Dispense Refill  . abiraterone acetate (ZYTIGA) 250 MG tablet Take 4 tablets (1,000 mg total) by mouth daily. Take on an empty stomach 1 hour before or 2 hours after a meal 120 tablet 5  . amLODipine (NORVASC) 5 MG tablet Take 1 tablet (5 mg total) by mouth daily. 90 tablet 3  . CALCIUM PO Take 1,200 mg by mouth daily.    . cholecalciferol (VITAMIN D) 1000 UNITS tablet Take 2,000 Units by mouth daily.     Marland Kitchen gabapentin (NEURONTIN) 300 MG capsule Take 1 capsule (300 mg total) by mouth at bedtime. 90 capsule 3  . HYDROcodone-acetaminophen (NORCO/VICODIN) 5-325 MG tablet Take 1 tablet by mouth daily. As needed. 60 tablet 0  . lisinopril (PRINIVIL,ZESTRIL) 5 MG tablet Take 1 tablet (5 mg total) by mouth daily. 90 tablet 3  . Multiple Vitamins-Minerals (CENTRUM SILVER 50+MEN) TABS Take by mouth daily.    . Omega-3 Fatty Acids (FISH OIL) 1000 MG CAPS Take by mouth daily.    . predniSONE (DELTASONE) 5 MG tablet Take 1 tablet (5 mg total) by mouth daily with breakfast. 30 tablet 5  . traMADol (ULTRAM) 50 MG tablet Take 1 tablet (50 mg total) by mouth 2 (two) times daily as needed. 30 tablet 0  . temazepam (RESTORIL) 30 MG capsule Take 1 capsule (30 mg total) by mouth at bedtime as needed for sleep. 30 capsule 0  . traZODone (DESYREL) 50 MG  tablet Take 0.5 tablets (25 mg total) by mouth at bedtime as needed for sleep. 30 tablet 0   No current facility-administered medications for this visit.    OBJECTIVE: Vitals:   12/13/19 1312  BP: 133/81  Pulse: 64  Resp: 16  Temp: 98.1 F (36.7 C)  SpO2: 100%     Body mass index is 30.97 kg/m.    ECOG FS:0 - Asymptomatic  General: Well-developed, well-nourished, no acute distress. Eyes: Pink conjunctiva, anicteric sclera. HEENT: Normocephalic, moist mucous membranes. Lungs: No audible wheezing or coughing. Heart: Regular rate and rhythm. Abdomen: Soft, nontender, no obvious distention. Musculoskeletal: No edema, cyanosis, or clubbing. Neuro: Alert, answering all questions appropriately. Cranial nerves grossly intact. Skin:  No rashes or petechiae noted. Psych: Normal affect.   LAB RESULTS:  Lab Results  Component Value Date   NA 143 12/13/2019   K 3.2 (L) 12/13/2019   CL 107 12/13/2019   CO2 28 12/13/2019   GLUCOSE 126 (H) 12/13/2019   BUN 20 12/13/2019   CREATININE 1.28 (H) 12/13/2019   CALCIUM 9.2 12/13/2019   PROT 6.8 12/13/2019   ALBUMIN 3.8 12/13/2019   AST 18 12/13/2019   ALT 11 12/13/2019   ALKPHOS 60 12/13/2019   BILITOT 0.6 12/13/2019   GFRNONAA 53 (L) 12/13/2019   GFRAA >60 12/13/2019    Lab Results  Component Value Date   WBC 5.9 12/13/2019   NEUTROABS 3.9 12/13/2019   HGB 12.9 (L) 12/13/2019   HCT 36.5 (L) 12/13/2019   MCV 87.1 12/13/2019   PLT 183 12/13/2019     STUDIES: No results found.  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  PLAN:    1. 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 Prostate biopsy results revealed Gleason's 8 (4+4) adenocarcinoma.  His most recent bone scan on January 15, 2017 revealed  improvement of his bony metastasis.  Patient's PSA had recently trended up to 0.94, but now is trending back down and is 0.71.  Today's result is pending.  Patient does not require repeat imaging, but would consider in the future if there is suspicion of progression of disease.  Continue Zytiga and prednisone as prescribed until intolerable side effects for definitive progression of disease.  Proceed with Xgeva today.  Return to clinic in 3 months with repeat laboratory work, further evaluation, and continuation of treatment. 2. Hypocalcemia: Resolved.  Continue calcium and vitamin  D supplementation.  Proceed with treatment as planned. 3. Pain: Patient does not complain of this today.   4.  Hypokalemia: Mild.  Continue oral potassium supplementation as prescribed. 5.  Insomnia: Patient was given a refill of trazodone and Restoril today.    Patient expressed understanding and was in agreement with this plan. He also understands that He can call clinic at any time with any questions, concerns, or complaints.   Cancer Staging Primary malignant 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 [facility]) Staging form: Prostate, AJCC 8th Edition - Clinical stage from 09/26/2016: Stage IVB (cTX, cN1, cM1c, PSA: 193) - Signed by Lloyd Huger, MD on 09/26/2016   Lloyd Huger, MD   12/13/2019 2:54 PM

## 2019-12-12 ENCOUNTER — Telehealth: Payer: Self-pay

## 2019-12-12 ENCOUNTER — Encounter: Payer: Self-pay | Admitting: Oncology

## 2019-12-12 NOTE — Telephone Encounter (Signed)
Noted. Thank you. Glad he's doing better.

## 2019-12-12 NOTE — Telephone Encounter (Signed)
[facility] [place] Night - Client TELEPHONE ADVICE RECORD [facility] Patient Name: Justin Ali Gender: Unknown DOB: [birth year] Age: 79 Y 21 D Return Phone Number: [phone] (Primary) Address: City/State/Zip: [place] [address] Client [facility] Night - Client Client Site [place] Physician Ria Bush - MD Contact Type Call Who Is Calling Patient / Member / Family / Caregiver Call Type Triage / Clinical Caller Name [facility] Relationship To Patient Spouse Return Phone Number [phone] (Primary) Chief Complaint Vomiting Reason for Call Symptomatic / Request for [name] states her husband had a tooth extracted and he has been vomiting every since. Translation No Nurse Assessment Nurse: Fredderick Phenix, RN, Lelan Pons Date/Time Eilene Ghazi Time): 12/10/2019 11:55:59 AM Confirm and document reason for call. If symptomatic, describe symptoms. ---Caller states her husband had a difficult tooth extraction yesterday. Has vomited several times since then, can't keep water down. Urinating well. Has the patient had close contact with a person known or suspected to have the novel coronavirus illness OR traveled / lives in area with major community spread (including international travel) in the last 14 days from the onset of symptoms? * If Asymptomatic, screen for exposure and travel within the last 14 days. ---No Does the patient have any new or worsening symptoms? ---Yes Will a triage be completed? ---Yes Related visit to physician within the last 2 weeks? ---No Does the PT have any chronic conditions? (i.e. diabetes, asthma, this includes High risk factors for pregnancy, etc.) ---Yes List chronic conditions. ---prostate ca Is this a behavioral health or substance abuse call? ---No Guidelines Guideline Title Affirmed Question Affirmed Notes Nurse Date/Time  Eilene Ghazi Time) Vomiting [1] MODERATE vomiting (e.g., 3 - 5 times/day) AND [2] age > 63 years [name] 12/10/2019 12:00:53 PM Disp. Time (Eastern Time) Disposition Final UserPLEASE NOTE: All timestamps contained within this report are represented as [place] Standard Time. CONFIDENTIALTY NOTICE: This fax transmission is intended only for the addressee. It contains information that is legally privileged, confidential or otherwise protected from use or disclosure. If you are not the intended recipient, you are strictly prohibited from reviewing, disclosing, copying using or disseminating any of this information or taking any action in reliance on or regarding this information. If you have received this fax in error, please notify us immediately by telephone so that we can arrange for its return to [place]. Phone: [phone], Toll-Free: [phone], Fax: [phone] Page: 2 of 2 Call Id: [id number] 12/10/2019 12:04:47 PM Go to ED Now (or PCP triage) Yes Fredderick Phenix, RN, [place] Disagree/Comply Comply Caller Understands Yes PreDisposition Did not know what to do Care Advice Given Per Guideline GO TO ED NOW (OR PCP TRIAGE): * IF NO PCP (PRIMARY CARE PROVIDER) SECOND-LEVEL TRIAGE: You need to be seen within the next hour. Go to the [facility] at _____________ [place] as soon as you can. BRING MEDICINES: * It is also a good idea to bring the pill bottles too. This will help the doctor to make certain you are taking the right medicines and the right dose. CARE ADVICE per Vomiting (Adult) guideline. Referrals [facility]

## 2019-12-12 NOTE — Progress Notes (Signed)
Patient called for pre assessment. Patient denies any concerns or pain. He would just like to know if he will be getting his shot.

## 2019-12-12 NOTE — Telephone Encounter (Signed)
[facility] Night - Client TELEPHONE ADVICE RECORD [facility] Patient Name: Justin Ali Gender: Male DOB: 10/22/1940 Age: 79 Y 21 D Return Phone Number: [phone] (Primary) Address: City/State/Zip: [place] [address] Client  [facility] Night - Client Client [place] Physician Ria Bush - MD Contact Type Call Who Is Calling Patient / Member / Family / Caregiver Call Type Triage / Clinical Caller Name [facility] Relationship To Patient Spouse Return Phone Number [phone] (Primary) Chief Complaint Vomiting Reason for Call Symptomatic / Request for [place] states her husband has been vomiting ever since tooth extraction yesterday. Additional Comment Caller spoke with RN Lelan Pons earlier, but needs more information. Translation No No Triage Reason Patient declined Nurse Assessment Nurse: Fredderick Phenix, RN, Lelan Pons Date/Time Eilene Ghazi Time): 12/10/2019 12:32:35 PM Confirm and document reason for call. If symptomatic, describe symptoms. ---Saturday virtual visits are full, patient referred to ER. Has the patient had close contact with a person known or suspected to have the novel coronavirus illness OR traveled / lives in area with major community spread (including international travel) in the last 14 days from the onset of symptoms? * If Asymptomatic, screen for exposure and travel within the last 14 days. ---No Does the patient have any new or worsening symptoms? ---Yes Will a triage be completed? ---No Select reason for no triage. ---Patient declined Disp. Time Eilene Ghazi Time) Disposition Final User 12/10/2019 12:22:26 PM Send To RN Personal Gloris Ham 12/10/2019 12:38:33 PM Clinical Call Yes Fredderick Phenix, RN, Lelan Pons

## 2019-12-12 NOTE — Telephone Encounter (Signed)
Contacted pt's wife, Hope, who advised pt is doing well now. She reports pt had a difficult tooth extraction and had to be numbed twice and she thinks all the numbing medication made him sick. He has eaten well over the weekend and is feeling fine today. Advised if anything changes to contact this office. Hope verbalized understanding and expressed appreciation to the entire office.

## 2019-12-13 ENCOUNTER — Inpatient Hospital Stay: Payer: No Typology Code available for payment source | Attending: Oncology

## 2019-12-13 ENCOUNTER — Other Ambulatory Visit: Payer: Self-pay

## 2019-12-13 ENCOUNTER — Inpatient Hospital Stay (HOSPITAL_BASED_OUTPATIENT_CLINIC_OR_DEPARTMENT_OTHER): Payer: No Typology Code available for payment source | Admitting: Oncology

## 2019-12-13 ENCOUNTER — Inpatient Hospital Stay: Payer: No Typology Code available for payment source

## 2019-12-13 VITALS — BP 133/81 | HR 64 | Temp 98.1°F | Resp 16 | Wt 189.0 lb

## 2019-12-13 DIAGNOSIS — C61 Malignant neoplasm of prostate: Secondary | ICD-10-CM | POA: Insufficient documentation

## 2019-12-13 DIAGNOSIS — C7951 Secondary malignant neoplasm of bone: Secondary | ICD-10-CM | POA: Insufficient documentation

## 2019-12-13 LAB — CBC WITH DIFFERENTIAL/PLATELET
Abs Immature Granulocytes: 0.01 10*3/uL (ref 0.00–0.07)
Basophils Absolute: 0 10*3/uL (ref 0.0–0.1)
Basophils Relative: 1 %
Eosinophils Absolute: 0.1 10*3/uL (ref 0.0–0.5)
Eosinophils Relative: 1 %
HCT: 36.5 % — ABNORMAL LOW (ref 39.0–52.0)
Hemoglobin: 12.9 g/dL — ABNORMAL LOW (ref 13.0–17.0)
Immature Granulocytes: 0 %
Lymphocytes Relative: 25 %
Lymphs Abs: 1.5 10*3/uL (ref 0.7–4.0)
MCH: 30.8 pg (ref 26.0–34.0)
MCHC: 35.3 g/dL (ref 30.0–36.0)
MCV: 87.1 fL (ref 80.0–100.0)
Monocytes Absolute: 0.4 10*3/uL (ref 0.1–1.0)
Monocytes Relative: 6 %
Neutro Abs: 3.9 10*3/uL (ref 1.7–7.7)
Neutrophils Relative %: 67 %
Platelets: 183 10*3/uL (ref 150–400)
RBC: 4.19 MIL/uL — ABNORMAL LOW (ref 4.22–5.81)
RDW: 13.3 % (ref 11.5–15.5)
WBC: 5.9 10*3/uL (ref 4.0–10.5)
nRBC: 0 % (ref 0.0–0.2)

## 2019-12-13 LAB — COMPREHENSIVE METABOLIC PANEL
ALT: 11 U/L (ref 0–44)
AST: 18 U/L (ref 15–41)
Albumin: 3.8 g/dL (ref 3.5–5.0)
Alkaline Phosphatase: 60 U/L (ref 38–126)
Anion gap: 8 (ref 5–15)
BUN: 20 mg/dL (ref 8–23)
CO2: 28 mmol/L (ref 22–32)
Calcium: 9.2 mg/dL (ref 8.9–10.3)
Chloride: 107 mmol/L (ref 98–111)
Creatinine, Ser: 1.28 mg/dL — ABNORMAL HIGH (ref 0.61–1.24)
GFR calc Af Amer: >60 mL/min (ref >60)
GFR calc non Af Amer: 53 mL/min — ABNORMAL LOW (ref >60)
Glucose, Bld: 126 mg/dL — ABNORMAL HIGH (ref 70–99)
Potassium: 3.2 mmol/L — ABNORMAL LOW (ref 3.5–5.1)
Sodium: 143 mmol/L (ref 135–145)
Total Bilirubin: 0.6 mg/dL (ref 0.3–1.2)
Total Protein: 6.8 g/dL (ref 6.5–8.1)

## 2019-12-13 LAB — PSA: Prostatic Specific Antigen: 0.97 ng/mL (ref 0.00–4.00)

## 2019-12-13 MED ORDER — DENOSUMAB 120 MG/1.7ML ~~LOC~~ SOLN
120.0000 mg | Freq: Once | SUBCUTANEOUS | Status: AC
  Administered 2019-12-13: 120 mg via SUBCUTANEOUS
  Filled 2019-12-13: qty 1.7

## 2019-12-13 MED ORDER — TEMAZEPAM 30 MG PO CAPS: 30.0000 mg | ORAL_CAPSULE | Freq: Every evening | ORAL | 0 refills | Status: DC | PRN

## 2019-12-13 MED ORDER — TRAZODONE HCL 50 MG PO TABS: 25.0000 mg | ORAL_TABLET | Freq: Every evening | ORAL | 0 refills | Status: DC | PRN

## 2020-01-06 ENCOUNTER — Other Ambulatory Visit: Payer: Self-pay | Admitting: Pharmacist

## 2020-01-06 DIAGNOSIS — C61 Malignant neoplasm of prostate: Secondary | ICD-10-CM

## 2020-01-06 MED ORDER — PREDNISONE 5 MG PO TABS: 5.0000 mg | ORAL_TABLET | Freq: Every day | ORAL | 5 refills | Status: DC

## 2020-01-06 MED ORDER — ABIRATERONE ACETATE 250 MG PO TABS: 1000.0000 mg | ORAL_TABLET | Freq: Every day | ORAL | 5 refills | Status: DC

## 2020-01-06 NOTE — Progress Notes (Signed)
Patient called to request refills for his prednisone and Zytiga (prednisone)

## 2020-02-21 ENCOUNTER — Other Ambulatory Visit: Payer: Self-pay | Admitting: Oncology

## 2020-02-21 ENCOUNTER — Telehealth: Payer: Self-pay | Admitting: Family Medicine

## 2020-02-22 NOTE — Telephone Encounter (Signed)
Pt calling to check on rx Pt will be out in 3 days   Best number [phone]

## 2020-02-22 NOTE — Telephone Encounter (Addendum)
Attempted to contact pt.  No answer.  No vm.  Need to know what doses pt is taking.  I need to speak with the pt.   Per notes from last visit with Dr. Darnell Level, temazepam 30mg  + trazodone 50 mg was too strong.  So pt was to continue temazepam 15 mg and trazodone 25 mg (1/2 of 50 mg tab) with option to take 50 mg (1 whole tab) if 25 mg didn't help.  And the goal was to taper off temazepam.

## 2020-02-22 NOTE — Telephone Encounter (Signed)
ERx - will review at next visit.

## 2020-02-22 NOTE — Telephone Encounter (Signed)
Pt returning call.  We discussed notes from last visit with Dr. Darnell Level and clarifying what doses pt is taking.  Per pt, he is taking trazodone 50 mg daily h.s.  States he discussed sleeping issues at last oncology appt and Dr. Grayland Ormond sent rx for temazepam 30 mg.  Pt tried to get another refill with Dr. Grayland Ormond but it was denied.  When I mentioned that Dr. Darnell Level mentioned about tapering off temazepam, pt remarked, "I'm 79 yrs old and I need to sleep!  What does he care?"  Name of Medication: Temazepam Name of Pharmacy: [facility] or Written Date and Quantity: 12/13/19, #30 Last Office Visit and Type: 08/08/19, chronic insomnia Next Office Visit and Type: 07/12/20, AWV prt 2 Last Controlled Substance Agreement Date: none Last UDS: none  Trazodone last rx:  12/13/19, #30

## 2020-02-24 NOTE — Telephone Encounter (Signed)
Pt called stating [facility] does not have rx  Can you please resend.  Please let pt know when this has been done

## 2020-02-24 NOTE — Telephone Encounter (Signed)
Spoke with pharmacist at [facility] asking about refills. Confirms they received both and pt has picked them up.

## 2020-03-10 NOTE — Progress Notes (Signed)
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  INTERVAL HISTORY: Patient returns to clinic today for routine 47-month evaluation and continuation of treatment. He has intermittent chronic back pain, but otherwise feels well. He is tolerating Zytiga, prednisone, and Xgeva without significant side effects. He has no neurologic complaints.  He has a good appetite and denies weight loss.  He denies any recent fevers or illnesses.  He denies any chest pain, shortness of breath, cough, or hemoptysis.  He denies any nausea, vomiting, constipation, or diarrhea. He has no urinary complaints. Patient offers no further specific complaints today.  REVIEW OF SYSTEMS:   Review of Systems  Constitutional: Negative.  Negative for fever, malaise/fatigue and weight loss.  Eyes: Negative.  Negative for blurred vision.  Respiratory: Negative.  Negative for cough and shortness of breath.   Cardiovascular: Negative.  Negative for chest pain and leg swelling.  Gastrointestinal: Negative.  Negative for abdominal pain.  Genitourinary: Negative.  Negative for frequency and urgency.  Musculoskeletal: Positive for back pain. Negative for joint pain.  Skin: Negative.  Negative for rash.  Neurological: Negative.  Negative for dizziness, sensory change, focal weakness and weakness.  Psychiatric/Behavioral: Negative.  The patient is not nervous/anxious and does not have insomnia.     As per HPI. Otherwise, a complete review of systems is negative.  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   2 mm - non-bleeding 03/18/2013 colonoscopy    CAD (coronary artery disease), native coronary artery 2014   By CT lung (04/2013)    Cataract    HLD (hyperlipidemia)    HTN (hypertension)    Hyperglycemia    NAFLD (nonalcoholic fatty liver disease) 08/2011   by abd [place] with increased LFTs   OSA (obstructive sleep apnea)    did not tolerate CPAP   Other benign neoplasm of connective and other soft tissue of unspecified site    Neurofibroma of lateral periorbital area   Peripheral neuropathy    Personal history of colonic adenomas 08/05/2012   Pneumonia 01/2013   CAP LUL   Restless legs syndrome (RLS)    Sleep apnea    no cpap    PAST SURGICAL HISTORY: Past Surgical History:  Procedure Laterality Date   COLONOSCOPY  07/2012   5 adenomas (tubular, TV, serrated), rec rpt 5 months [name])   COLONOSCOPY  02/2013   residual adenomas, cecal AVM, rec rpt 1 yr [name])   COLONOSCOPY  03/2014   no residual polyp, cecal AVM, rpt 3-4 yrs [name])   ECTROPION REPAIR Bilateral 09/29/2017   Procedure: REPAIR OF ECTROPION, EXTENSIVE UPPER AND LOWER;  Surgeon: Karle Starch, MD;  Location: [facility];  Service: Ophthalmology;  Laterality: Bilateral;   ORIF ANKLE FRACTURE  11/29/00   Dr. Tamala Julian   PTOSIS REPAIR Bilateral 09/29/2017   Procedure: BLEPHAROPTOSIS REPAIR RESECT EX UPPER;  Surgeon: Karle Starch, MD;  Location: [facility];  Service: Ophthalmology;  Laterality: Bilateral;   RECONSTRUCTION OF EYELID Bilateral 05/31/2018   SKIN CANCER EXCISION  1997   left anterior neck    FAMILY HISTORY: Family History  Problem Relation Age  of Onset   Cancer Father        liver   Alcohol abuse Father    Liver cancer Father    Cancer Mother        breast   Breast cancer Mother    Hypertension Brother        Half-brother   Coronary artery disease Paternal Aunt        CABG   Cancer Other        GM; Aunt - breast    Colon cancer Neg Hx    Rectal cancer Neg Hx    Stomach cancer Neg Hx    Esophageal cancer Neg Hx     ADVANCED DIRECTIVES (Y/N):  N  HEALTH MAINTENANCE: Social History   Tobacco Use   Smoking status: Former Smoker    Types: Cigarettes, Cigars    Quit date: 04/28/2002    Years since quitting: 17.8   Smokeless tobacco: Never Used   Tobacco comment: rare cigar  Vaping Use   Vaping Use: Never used  Substance Use Topics   Alcohol use: Not Currently   Drug use: No     Colonoscopy:  PAP:  Bone density:  Lipid panel:  No Known Allergies  Current Outpatient Medications  Medication Sig Dispense Refill   abiraterone acetate (ZYTIGA) 250 MG tablet Take 4 tablets (1,000 mg total) by mouth daily. Take on an empty stomach 1 hour before or 2 hours after a meal 120 tablet 5   amLODipine (NORVASC) 5 MG tablet Take 1 tablet (5 mg total) by mouth daily. 90 tablet 3   CALCIUM PO Take 1,200 mg by mouth daily.     cholecalciferol (VITAMIN D) 1000 UNITS tablet Take 2,000 Units by mouth daily.      gabapentin (NEURONTIN) 300 MG capsule Take 1 capsule (300 mg total) by mouth at bedtime. 90 capsule 3   HYDROcodone-acetaminophen (NORCO/VICODIN) 5-325 MG tablet Take 1 tablet by mouth daily. As needed. 60 tablet 0   lisinopril (PRINIVIL,ZESTRIL) 5 MG tablet Take 1 tablet (5 mg total) by mouth daily. 90 tablet 3   Multiple Vitamins-Minerals (CENTRUM SILVER 50+MEN) TABS Take by mouth daily.     Omega-3 Fatty Acids (FISH OIL) 1000 MG CAPS Take by mouth daily.     predniSONE (DELTASONE) 5 MG tablet Take 1 tablet (5 mg total) by mouth daily with breakfast. 30 tablet 5   temazepam (RESTORIL) 30 MG capsule Take 1 capsule (30 mg total) by mouth at bedtime as needed for sleep. 30 capsule 0   traMADol (ULTRAM) 50 MG tablet Take 1 tablet (50 mg total) by mouth 2 (two) times daily as needed. 30 tablet 0   traZODone (DESYREL) 50 MG tablet Take 0.5-1 tablets (25-50 mg total) by mouth at bedtime  as needed for sleep. 30 tablet 6   No current facility-administered medications for this visit.    OBJECTIVE: Vitals:   03/14/20 1101  BP: 118/76  Pulse: 81  Resp: 16  Temp: 98.7 F (37.1 C)  SpO2: 96%     Body mass index is 31.68 kg/m.    ECOG FS:0 - Asymptomatic  General: Well-developed, well-nourished, no acute distress. Eyes: Pink conjunctiva, anicteric sclera. HEENT: Normocephalic, moist mucous membranes. Lungs: No audible wheezing or coughing. Heart: Regular rate and rhythm. Abdomen: Soft, nontender, no obvious distention. Musculoskeletal: No edema, cyanosis, or clubbing. Neuro: Alert, answering all questions appropriately. Cranial nerves grossly intact. Skin: No rashes or petechiae noted. Psych: Normal affect.  LAB RESULTS:  Lab Results  Component Value Date   NA 143 03/14/2020   K 3.8 03/14/2020   CL 108 03/14/2020   CO2 28 03/14/2020   GLUCOSE 102 (H) 03/14/2020   BUN 14 03/14/2020   CREATININE 1.04 03/14/2020   CALCIUM 8.9 03/14/2020   PROT 6.4 (L) 03/14/2020   ALBUMIN 3.8 03/14/2020   AST 17 03/14/2020   ALT 13 03/14/2020   ALKPHOS 50 03/14/2020   BILITOT 0.9 03/14/2020   GFRNONAA >60 03/14/2020   GFRAA >60 12/13/2019    Lab Results  Component Value Date   WBC 5.6 03/14/2020   NEUTROABS 3.5 03/14/2020   HGB 12.9 (L) 03/14/2020   HCT 36.6 (L) 03/14/2020   MCV 89.5 03/14/2020   PLT 180 03/14/2020     STUDIES: No results found.  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  PLAN:    1. 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 Prostate biopsy results revealed Gleason's 8 (4+4) adenocarcinoma.  His most recent bone scan on January 15, 2017 revealed improvement of his bony metastasis. Patient's PSA has ranged from 0.59-0.94  over the past year. He does not require repeat imaging, but would consider in the future if there is suspicion of progression of disease.  Continue Zytiga and prednisone as prescribed until intolerable side effects for definitive progression of disease. Proceed with Xgeva today. Return to clinic in 3 months with repeat laboratory work, further evaluation, and continuation of treatment. 2. Hypocalcemia: Resolved.  Continue calcium and vitamin  D supplementation.  Proceed with treatment as planned. 3. Pain: Mild and intermittent. Patient was given a prescription for tramadol today. 4.  Hypokalemia: Resolved. Continue oral potassium supplementation as prescribed. 5.  Insomnia: Patient does not complain of this today. He was previously given a refill of trazodone and Restoril, but was instructed that further refills must be obtained from his primary care physician.    Patient expressed understanding and was in agreement with this plan. He also understands that He can call clinic at any time with any questions, concerns, or complaints.   Cancer Staging Primary malignant 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 [facility]) Staging form: Prostate, AJCC 8th Edition - Clinical stage from 09/26/2016: Stage IVB (cTX, cN1, cM1c, PSA: 193) - Signed by Lloyd Huger, MD on 09/26/2016   Lloyd Huger, MD   03/17/2020 7:36 AM

## 2020-03-14 ENCOUNTER — Other Ambulatory Visit: Payer: Self-pay

## 2020-03-14 ENCOUNTER — Inpatient Hospital Stay: Payer: No Typology Code available for payment source | Attending: Oncology

## 2020-03-14 ENCOUNTER — Inpatient Hospital Stay (HOSPITAL_BASED_OUTPATIENT_CLINIC_OR_DEPARTMENT_OTHER): Payer: No Typology Code available for payment source | Admitting: Oncology

## 2020-03-14 ENCOUNTER — Inpatient Hospital Stay: Payer: No Typology Code available for payment source

## 2020-03-14 ENCOUNTER — Encounter: Payer: Self-pay | Admitting: Oncology

## 2020-03-14 VITALS — BP 118/76 | HR 81 | Temp 98.7°F | Resp 16 | Wt 193.3 lb

## 2020-03-14 DIAGNOSIS — C61 Malignant neoplasm of prostate: Secondary | ICD-10-CM

## 2020-03-14 DIAGNOSIS — C7951 Secondary malignant neoplasm of bone: Secondary | ICD-10-CM

## 2020-03-14 DIAGNOSIS — Z79899 Other long term (current) drug therapy: Secondary | ICD-10-CM | POA: Insufficient documentation

## 2020-03-14 LAB — COMPREHENSIVE METABOLIC PANEL
ALT: 13 U/L (ref 0–44)
AST: 17 U/L (ref 15–41)
Albumin: 3.8 g/dL (ref 3.5–5.0)
Alkaline Phosphatase: 50 U/L (ref 38–126)
Anion gap: 7 (ref 5–15)
BUN: 14 mg/dL (ref 8–23)
CO2: 28 mmol/L (ref 22–32)
Calcium: 8.9 mg/dL (ref 8.9–10.3)
Chloride: 108 mmol/L (ref 98–111)
Creatinine, Ser: 1.04 mg/dL (ref 0.61–1.24)
GFR, Estimated: >60 mL/min (ref >60)
Glucose, Bld: 102 mg/dL — ABNORMAL HIGH (ref 70–99)
Potassium: 3.8 mmol/L (ref 3.5–5.1)
Sodium: 143 mmol/L (ref 135–145)
Total Bilirubin: 0.9 mg/dL (ref 0.3–1.2)
Total Protein: 6.4 g/dL — ABNORMAL LOW (ref 6.5–8.1)

## 2020-03-14 LAB — CBC WITH DIFFERENTIAL/PLATELET
Abs Immature Granulocytes: 0.02 10*3/uL (ref 0.00–0.07)
Basophils Absolute: 0 10*3/uL (ref 0.0–0.1)
Basophils Relative: 1 %
Eosinophils Absolute: 0.2 10*3/uL (ref 0.0–0.5)
Eosinophils Relative: 3 %
HCT: 36.6 % — ABNORMAL LOW (ref 39.0–52.0)
Hemoglobin: 12.9 g/dL — ABNORMAL LOW (ref 13.0–17.0)
Immature Granulocytes: 0 %
Lymphocytes Relative: 24 %
Lymphs Abs: 1.3 10*3/uL (ref 0.7–4.0)
MCH: 31.5 pg (ref 26.0–34.0)
MCHC: 35.2 g/dL (ref 30.0–36.0)
MCV: 89.5 fL (ref 80.0–100.0)
Monocytes Absolute: 0.5 10*3/uL (ref 0.1–1.0)
Monocytes Relative: 8 %
Neutro Abs: 3.5 10*3/uL (ref 1.7–7.7)
Neutrophils Relative %: 64 %
Platelets: 180 10*3/uL (ref 150–400)
RBC: 4.09 MIL/uL — ABNORMAL LOW (ref 4.22–5.81)
RDW: 13.2 % (ref 11.5–15.5)
WBC: 5.6 10*3/uL (ref 4.0–10.5)
nRBC: 0 % (ref 0.0–0.2)

## 2020-03-14 LAB — PSA: Prostatic Specific Antigen: 0.59 ng/mL (ref 0.00–4.00)

## 2020-03-14 MED ORDER — DENOSUMAB 120 MG/1.7ML ~~LOC~~ SOLN
120.0000 mg | Freq: Once | SUBCUTANEOUS | Status: AC
  Administered 2020-03-14: 120 mg via SUBCUTANEOUS
  Filled 2020-03-14: qty 1.7

## 2020-03-14 MED ORDER — TRAMADOL HCL 50 MG PO TABS: 50.0000 mg | ORAL_TABLET | Freq: Two times a day (BID) | ORAL | 0 refills | Status: DC | PRN

## 2020-03-14 NOTE — Progress Notes (Signed)
Pt request refill on tramadol 50mg . He reports intermittent lower back pain after going to the gym a couple days a week.

## 2020-03-26 ENCOUNTER — Other Ambulatory Visit: Payer: Self-pay | Admitting: Family Medicine

## 2020-03-26 NOTE — Telephone Encounter (Signed)
ERx 

## 2020-04-24 ENCOUNTER — Other Ambulatory Visit: Payer: Self-pay | Admitting: Family Medicine

## 2020-04-24 NOTE — Telephone Encounter (Signed)
Pharmacy requests refill on: Temazepam 30 mg    LAST REFILL: 03/26/2020 (Q-30, R-0) LAST OV: 08/08/2019 NEXT OV: 07/12/2020 PHARMACY: [facility] [place], [place]

## 2020-04-26 NOTE — Telephone Encounter (Signed)
Pharmacy requests refill on: Temazepam 30 mg    LAST REFILL: 03/26/2020 (Q-30, R-0) LAST OV: 08/08/2019 NEXT OV: 07/12/2020 PHARMACY: [facility] [place], [place]     

## 2020-04-26 NOTE — Telephone Encounter (Signed)
Pt left v/m that [place] has not had response to refill request and pt wants CB today.

## 2020-04-27 NOTE — Telephone Encounter (Signed)
ERx 

## 2020-05-08 ENCOUNTER — Telehealth: Payer: Self-pay | Admitting: Pharmacist

## 2020-05-08 NOTE — Telephone Encounter (Signed)
Oral Chemotherapy Pharmacist Encounter   Received a call from Justin Ali ([email or web address]) that Mr. Justin Ali was having an issue with his Zytiga delivery. Patient receives his Zytiga from the [place].   Called Justin Ali and he stated that the [facility] would be be able to delivery his Zytiga until 05/14/20. He reports that he will be out of medication for 5 days by then. Instructed the patient to start taking his Zytiga every other day now and call me in 1/18 if his medication was not delivered, we will look into the issue some more.  Darl Pikes, PharmD, BCPS, BCOP, CPP Hematology/Oncology Clinical Pharmacist [facility]/AP [facility] [phone]  05/08/2020 11:22 AM

## 2020-05-28 ENCOUNTER — Other Ambulatory Visit: Payer: Self-pay | Admitting: Family Medicine

## 2020-05-28 NOTE — Telephone Encounter (Signed)
ERx 

## 2020-06-01 ENCOUNTER — Other Ambulatory Visit: Payer: Self-pay | Admitting: Pharmacist

## 2020-06-01 DIAGNOSIS — C61 Malignant neoplasm of prostate: Secondary | ICD-10-CM

## 2020-06-01 MED ORDER — ABIRATERONE ACETATE 250 MG PO TABS: 1000.0000 mg | ORAL_TABLET | Freq: Every day | ORAL | 5 refills | Status: DC

## 2020-06-01 MED ORDER — PREDNISONE 5 MG PO TABS: 5.0000 mg | ORAL_TABLET | Freq: Every day | ORAL | 5 refills | Status: DC

## 2020-06-09 NOTE — Progress Notes (Signed)
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  INTERVAL HISTORY: Patient returns to clinic today for routine 45-month evaluation and continuation of treatment.  He continues to have chronic back pain, but otherwise feels well.  He is tolerating Zytiga, prednisone, and Xgeva without significant side effects. He has no neurologic complaints.  He has a good appetite and denies weight loss.  He denies any recent fevers or illnesses.  He denies any chest pain, shortness of breath, cough, or hemoptysis.  He denies any nausea, vomiting, constipation, or diarrhea. He has no urinary complaints.  Patient offers no further specific complaints today.  REVIEW OF SYSTEMS:   Review of Systems  Constitutional: Negative.  Negative for fever, malaise/fatigue and weight loss.  Eyes: Negative.  Negative for blurred vision.  Respiratory: Negative.  Negative for cough and shortness of breath.   Cardiovascular: Negative.  Negative for chest pain and leg swelling.  Gastrointestinal: Negative.  Negative for abdominal pain.  Genitourinary: Negative.  Negative for frequency and urgency.  Musculoskeletal: Positive for back pain. Negative for joint pain.  Skin: Negative.  Negative for rash.  Neurological: Negative.  Negative for dizziness, sensory change, focal weakness and weakness.  Psychiatric/Behavioral: Negative.  The patient is not nervous/anxious and does not have insomnia.     As per HPI. Otherwise, a complete review of systems is negative.  PAST MEDICAL HISTORY: Past Medical History:  Diagnosis Date  .  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 CAD (coronary artery disease), native coronary artery 2014   By CT lung (04/2013)   . Cataract   . HLD (hyperlipidemia)   . HTN (hypertension)   . Hyperglycemia   . NAFLD (nonalcoholic fatty liver disease) 08/2011   by abd [place] with increased LFTs  . OSA (obstructive sleep apnea)    did not tolerate CPAP  . Other benign neoplasm of connective and other soft tissue of unspecified site    Neurofibroma of lateral periorbital area  . Peripheral neuropathy   . Personal history of colonic adenomas 08/05/2012  . Pneumonia 01/2013   CAP LUL  . Restless legs syndrome (RLS)   . Sleep apnea    no cpap    PAST SURGICAL HISTORY: Past Surgical History:  Procedure Laterality Date  . COLONOSCOPY  07/2012   5 adenomas (tubular, TV, serrated), rec rpt 5 months [name])  . COLONOSCOPY  02/2013   residual adenomas, cecal AVM, rec rpt 1 yr [name])  . COLONOSCOPY  03/2014   no residual polyp, cecal AVM, rpt 3-4 yrs [name])  . ECTROPION REPAIR Bilateral 09/29/2017   Procedure: REPAIR OF ECTROPION, EXTENSIVE UPPER AND LOWER;  Surgeon: Karle Starch, MD;  Location: [place];  Service: Ophthalmology;  Laterality: Bilateral;  . ORIF ANKLE FRACTURE  11/29/00   Dr. Tamala Julian  . PTOSIS REPAIR Bilateral 09/29/2017   Procedure: BLEPHAROPTOSIS REPAIR RESECT EX UPPER;  Surgeon: Karle Starch, MD;  Location: [place];  Service: Ophthalmology;  Laterality: Bilateral;  . RECONSTRUCTION OF EYELID Bilateral 05/31/2018  . SKIN CANCER EXCISION  1997   left anterior neck    FAMILY HISTORY: Family History  Problem Relation Age of Onset  . Cancer Father        liver  . Alcohol abuse Father   . Liver cancer Father   . Cancer Mother        breast  . Breast cancer Mother   . Hypertension Brother        Half-brother  . Coronary artery disease Paternal Aunt        CABG  . Cancer Other        GM; Aunt - breast  .  Colon cancer Neg Hx   . Rectal cancer Neg Hx   . Stomach cancer Neg Hx   . Esophageal cancer Neg Hx     ADVANCED DIRECTIVES (Y/N):  N  HEALTH MAINTENANCE: Social History   Tobacco Use  . Smoking status: Former Smoker    Types: Cigarettes, Cigars    Quit date: 04/28/2002    Years since quitting: 18.1  . Smokeless tobacco: Never Used  . Tobacco comment: rare cigar  Vaping Use  . Vaping Use: Never used  Substance Use Topics  . Alcohol use: Not Currently  . Drug use: No     Colonoscopy:  PAP:  Bone density:  Lipid panel:  No Known Allergies  Current Outpatient Medications  Medication Sig Dispense Refill  . abiraterone acetate (ZYTIGA) 250 MG tablet Take 4 tablets (1,000 mg total) by mouth daily. Take on an empty stomach 1 hour before or 2 hours after a meal 120 tablet 5  . amLODipine (NORVASC) 5 MG tablet Take 1 tablet (5 mg total) by mouth daily. 90 tablet 3  . CALCIUM PO Take 1,200 mg by mouth daily.    . cholecalciferol (VITAMIN D) 1000 UNITS tablet Take 2,000 Units by mouth daily.     Marland Kitchen gabapentin (NEURONTIN) 300 MG capsule Take 1 capsule (300 mg total) by mouth at bedtime. 90 capsule 3  . HYDROcodone-acetaminophen (NORCO/VICODIN) 5-325 MG tablet Take 1 tablet by mouth daily. As needed. 60 tablet 0  . lisinopril (PRINIVIL,ZESTRIL) 5 MG tablet Take 1 tablet (5 mg total) by mouth daily. 90 tablet 3  . Multiple Vitamins-Minerals (CENTRUM SILVER 50+MEN) TABS Take by mouth daily.    . Omega-3 Fatty Acids (FISH OIL) 1000 MG CAPS Take by mouth daily.    . predniSONE (DELTASONE) 5 MG tablet Take 1 tablet (5 mg total) by mouth daily with breakfast. 30 tablet 5  . temazepam (RESTORIL) 30 MG capsule Take 1 capsule (15 mg total) by mouth at bedtime as needed for sleep. 30 capsule 0  . traZODone (DESYREL) 50 MG tablet Take 0.5-1 tablets (25-50 mg total) by mouth at bedtime as needed for sleep. 30 tablet 6  . traMADol (ULTRAM) 50 MG tablet Take 1 tablet (50 mg total) by mouth 2 (two)  times daily as needed. 30 tablet 3   No current facility-administered medications for this visit.    OBJECTIVE: Vitals:   06/14/20 1533  BP: (!) 134/92  Pulse: 73  Resp: 20  Temp: 98.7 F (37.1 C)  SpO2: 100%     Body mass index is 31.92 kg/m.    ECOG FS:0 - Asymptomatic  General: Well-developed, well-nourished, no acute distress. Eyes: Pink conjunctiva, anicteric sclera. HEENT: Normocephalic, moist mucous membranes. Lungs: No audible wheezing or coughing. Heart: Regular rate and rhythm. Abdomen: Soft, nontender, no obvious distention. Musculoskeletal: No edema, cyanosis, or clubbing. Neuro: Alert, answering all questions appropriately. Cranial nerves grossly intact. Skin: No rashes or petechiae noted. Psych: Normal affect.  LAB  RESULTS:  Lab Results  Component Value Date   NA 140 06/14/2020   K 4.4 06/14/2020   CL 105 06/14/2020   CO2 27 06/14/2020   GLUCOSE 123 (H) 06/14/2020   BUN 14 06/14/2020   CREATININE 1.06 06/14/2020   CALCIUM 8.7 (L) 06/14/2020   PROT 6.4 (L) 06/14/2020   ALBUMIN 3.8 06/14/2020   AST 18 06/14/2020   ALT 13 06/14/2020   ALKPHOS 56 06/14/2020   BILITOT 0.6 06/14/2020   GFRNONAA >60 06/14/2020   GFRAA >60 12/13/2019    Lab Results  Component Value Date   WBC 5.0 06/14/2020   NEUTROABS 3.5 06/14/2020   HGB 12.9 (L) 06/14/2020   HCT 38.0 (L) 06/14/2020   MCV 89.6 06/14/2020   PLT 168 06/14/2020     STUDIES: No results found.  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  PLAN:    1. 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 Prostate biopsy results revealed Gleason's 8 (4+4) adenocarcinoma.  His most recent bone scan on January 15, 2017 revealed improvement of his bony metastasis.  Initially on Sep 16, 2016  patient's PSA was approximately 194.  Since August 2018 his PSA has ranged from 0.43-0.97.  Today's result is 0.76.  He does not require repeat imaging unless there is suspicion of progression of disease.  Continue Zytiga and prednisone as prescribed until intolerable side effects for definitive progression of disease.  Proceed with Xgeva today.  Return to clinic in 3 months with repeat laboratory work, further evaluation, and continuation of treatment.   2. Hypocalcemia: Resolved.  Continue calcium and vitamin  D supplementation.  Proceed with treatment as planned. 3. Pain: Chronic and unchanged.  Continue tramadol as needed. 4.  Hypokalemia: Resolved. Continue oral potassium supplementation as prescribed. 5.  Insomnia: Patient does not complain of this today. He was previously given a refill of trazodone and Restoril, but was instructed that further refills must be obtained from his primary care physician.  I spent a total of 30 minutes reviewing chart data, face-to-face evaluation with the patient, counseling and coordination of care as detailed above.     Patient expressed understanding and was in agreement with this plan. He also understands that He can call clinic at any time with any questions, concerns, or complaints.   Cancer Staging Primary malignant 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 [facility]) Staging form: Prostate, AJCC 8th Edition - Clinical stage from 09/26/2016: Stage IVB (cTX, cN1, cM1c, PSA: 193) - Signed by Lloyd Huger, MD on 09/26/2016 Prostate specific antigen (PSA) range: 20 or greater   Lloyd Huger, MD   06/16/2020 8:24 AM

## 2020-06-14 ENCOUNTER — Encounter: Payer: Self-pay | Admitting: Oncology

## 2020-06-14 ENCOUNTER — Inpatient Hospital Stay: Payer: No Typology Code available for payment source | Attending: Oncology

## 2020-06-14 ENCOUNTER — Other Ambulatory Visit: Payer: Self-pay

## 2020-06-14 ENCOUNTER — Inpatient Hospital Stay: Payer: No Typology Code available for payment source

## 2020-06-14 ENCOUNTER — Inpatient Hospital Stay (HOSPITAL_BASED_OUTPATIENT_CLINIC_OR_DEPARTMENT_OTHER): Payer: No Typology Code available for payment source | Admitting: Oncology

## 2020-06-14 VITALS — BP 134/92 | HR 73 | Temp 98.7°F | Resp 20 | Wt 194.8 lb

## 2020-06-14 DIAGNOSIS — C61 Malignant neoplasm of prostate: Secondary | ICD-10-CM | POA: Insufficient documentation

## 2020-06-14 DIAGNOSIS — C7951 Secondary malignant neoplasm of bone: Secondary | ICD-10-CM

## 2020-06-14 LAB — CBC WITH DIFFERENTIAL/PLATELET
Abs Immature Granulocytes: 0.02 10*3/uL (ref 0.00–0.07)
Basophils Absolute: 0 10*3/uL (ref 0.0–0.1)
Basophils Relative: 1 %
Eosinophils Absolute: 0.1 10*3/uL (ref 0.0–0.5)
Eosinophils Relative: 2 %
HCT: 38 % — ABNORMAL LOW (ref 39.0–52.0)
Hemoglobin: 12.9 g/dL — ABNORMAL LOW (ref 13.0–17.0)
Immature Granulocytes: 0 %
Lymphocytes Relative: 22 %
Lymphs Abs: 1.1 10*3/uL (ref 0.7–4.0)
MCH: 30.4 pg (ref 26.0–34.0)
MCHC: 33.9 g/dL (ref 30.0–36.0)
MCV: 89.6 fL (ref 80.0–100.0)
Monocytes Absolute: 0.3 10*3/uL (ref 0.1–1.0)
Monocytes Relative: 5 %
Neutro Abs: 3.5 10*3/uL (ref 1.7–7.7)
Neutrophils Relative %: 70 %
Platelets: 168 10*3/uL (ref 150–400)
RBC: 4.24 MIL/uL (ref 4.22–5.81)
RDW: 12.8 % (ref 11.5–15.5)
WBC: 5 10*3/uL (ref 4.0–10.5)
nRBC: 0 % (ref 0.0–0.2)

## 2020-06-14 LAB — COMPREHENSIVE METABOLIC PANEL
ALT: 13 U/L (ref 0–44)
AST: 18 U/L (ref 15–41)
Albumin: 3.8 g/dL (ref 3.5–5.0)
Alkaline Phosphatase: 56 U/L (ref 38–126)
Anion gap: 8 (ref 5–15)
BUN: 14 mg/dL (ref 8–23)
CO2: 27 mmol/L (ref 22–32)
Calcium: 8.7 mg/dL — ABNORMAL LOW (ref 8.9–10.3)
Chloride: 105 mmol/L (ref 98–111)
Creatinine, Ser: 1.06 mg/dL (ref 0.61–1.24)
GFR, Estimated: >60 mL/min (ref >60)
Glucose, Bld: 123 mg/dL — ABNORMAL HIGH (ref 70–99)
Potassium: 4.4 mmol/L (ref 3.5–5.1)
Sodium: 140 mmol/L (ref 135–145)
Total Bilirubin: 0.6 mg/dL (ref 0.3–1.2)
Total Protein: 6.4 g/dL — ABNORMAL LOW (ref 6.5–8.1)

## 2020-06-14 LAB — PSA: Prostatic Specific Antigen: 0.76 ng/mL (ref 0.00–4.00)

## 2020-06-14 MED ORDER — DENOSUMAB 120 MG/1.7ML ~~LOC~~ SOLN
120.0000 mg | Freq: Once | SUBCUTANEOUS | Status: AC
  Administered 2020-06-14: 120 mg via SUBCUTANEOUS
  Filled 2020-06-14: qty 1.7

## 2020-06-14 MED ORDER — TRAMADOL HCL 50 MG PO TABS: 50.0000 mg | ORAL_TABLET | Freq: Two times a day (BID) | ORAL | 3 refills | Status: DC | PRN

## 2020-07-04 ENCOUNTER — Ambulatory Visit (INDEPENDENT_AMBULATORY_CARE_PROVIDER_SITE_OTHER): Payer: Medicare Other

## 2020-07-04 ENCOUNTER — Other Ambulatory Visit: Payer: Self-pay

## 2020-07-04 DIAGNOSIS — Z0000 Encounter for general adult medical examination without abnormal findings: Secondary | ICD-10-CM | POA: Diagnosis not present

## 2020-07-04 NOTE — Progress Notes (Signed)
PCP notes:  Health Maintenance: Flu- decline Colonoscopy- declined   Abnormal Screenings: none   Patient concerns: none   Nurse concerns: none   Next PCP appt.: 07/13/2020 @ 2:30 pm

## 2020-07-04 NOTE — Patient Instructions (Signed)
Justin Ali , Thank you for taking time to come for your Medicare Wellness Visit. I appreciate your ongoing commitment to your health goals. Please review the following plan we discussed and let me know if I can assist you in the future.   Screening recommendations/referrals: Colonoscopy: declined Recommended yearly ophthalmology/optometry visit for glaucoma screening and checkup Recommended yearly dental visit for hygiene and checkup  Vaccinations: Influenza vaccine: declined Pneumococcal vaccine: Completed series Tdap vaccine: decline-insurance Shingles vaccine: due, check with your insurance regarding coverage if interested    Covid-19: Completed series  Advanced directives: copy in chart  Conditions/risks identified: hypertension  Next appointment: Follow up in one year for your annual wellness visit.   Preventive Care 79 Years and Older, Male Preventive care refers to lifestyle choices and visits with your health care provider that can promote health and wellness. What does preventive care include?  A yearly physical exam. This is also called an annual well check.  Dental exams once or twice a year.  Routine eye exams. Ask your health care provider how often you should have your eyes checked.  Personal lifestyle choices, including:  Daily care of your teeth and gums.  Regular physical activity.  Eating a healthy diet.  Avoiding tobacco and drug use.  Limiting alcohol use.  Practicing safe sex.  Taking low doses of aspirin every day.  Taking vitamin and mineral supplements as recommended by your health care provider. What happens during an annual well check? The services and screenings done by your health care provider during your annual well check will depend on your age, overall health, lifestyle risk factors, and family history of disease. Counseling  Your health care provider may ask you questions about your:  Alcohol use.  Tobacco use.  Drug  use.  Emotional well-being.  Home and relationship well-being.  Sexual activity.  Eating habits.  History of falls.  Memory and ability to understand (cognition).  Work and work Statistician. Screening  You may have the following tests or measurements:  Height, weight, and BMI.  Blood pressure.  Lipid and cholesterol levels. These may be checked every 5 years, or more frequently if you are over 36 years old.  Skin check.  Lung cancer screening. You may have this screening every year starting at age 42 if you have a 30-pack-year history of smoking and currently smoke or have quit within the past 15 years.  Fecal occult blood test (FOBT) of the stool. You may have this test every year starting at age 38.  Flexible sigmoidoscopy or colonoscopy. You may have a sigmoidoscopy every 5 years or a colonoscopy every 10 years starting at age 42.  Prostate cancer screening. Recommendations will vary depending on your family history and other risks.  Hepatitis C blood test.  Hepatitis B blood test.  Sexually transmitted disease (STD) testing.  Diabetes screening. This is done by checking your blood sugar (glucose) after you have not eaten for a while (fasting). You may have this done every 1-3 years.  Abdominal aortic aneurysm (AAA) screening. You may need this if you are a current or former smoker.  Osteoporosis. You may be screened starting at age 55 if you are at high risk. Talk with your health care provider about your test results, treatment options, and if necessary, the need for more tests. Vaccines  Your health care provider may recommend certain vaccines, such as:  Influenza vaccine. This is recommended every year.  Tetanus, diphtheria, and acellular pertussis (Tdap, Td) vaccine. You may  need a Td booster every 10 years.  Zoster vaccine. You may need this after age 23.  Pneumococcal 13-valent conjugate (PCV13) vaccine. One dose is recommended after age  10.  Pneumococcal polysaccharide (PPSV23) vaccine. One dose is recommended after age 44. Talk to your health care provider about which screenings and vaccines you need and how often you need them. This information is not intended to replace advice given to you by your health care provider. Make sure you discuss any questions you have with your health care provider. Document Released: 05/11/2015 Document Revised: 01/02/2016 Document Reviewed: 02/13/2015 Elsevier Interactive Patient Education  2017 [employer] Prevention in the Home Falls can cause injuries. They can happen to people of all ages. There are many things you can do to make your home safe and to help prevent falls. What can I do on the outside of my home?  Regularly fix the edges of walkways and driveways and fix any cracks.  Remove anything that might make you trip as you walk through a door, such as a raised step or threshold.  Trim any bushes or trees on the path to your home.  Use bright outdoor lighting.  Clear any walking paths of anything that might make someone trip, such as rocks or tools.  Regularly check to see if handrails are loose or broken. Make sure that both sides of any steps have handrails.  Any raised decks and porches should have guardrails on the edges.  Have any leaves, snow, or ice cleared regularly.  Use sand or salt on walking paths during winter.  Clean up any spills in your garage right away. This includes oil or grease spills. What can I do in the bathroom?  Use night lights.  Install grab bars by the toilet and in the tub and shower. Do not use towel bars as grab bars.  Use non-skid mats or decals in the tub or shower.  If you need to sit down in the shower, use a plastic, non-slip stool.  Keep the floor dry. Clean up any water that spills on the floor as soon as it happens.  Remove soap buildup in the tub or shower regularly.  Attach bath mats securely with double-sided  non-slip rug tape.  Do not have throw rugs and other things on the floor that can make you trip. What can I do in the bedroom?  Use night lights.  Make sure that you have a light by your bed that is easy to reach.  Do not use any sheets or blankets that are too big for your bed. They should not hang down onto the floor.  Have a firm chair that has side arms. You can use this for support while you get dressed.  Do not have throw rugs and other things on the floor that can make you trip. What can I do in the kitchen?  Clean up any spills right away.  Avoid walking on wet floors.  Keep items that you use a lot in easy-to-reach places.  If you need to reach something above you, use a strong step stool that has a grab bar.  Keep electrical cords out of the way.  Do not use floor polish or wax that makes floors slippery. If you must use wax, use non-skid floor wax.  Do not have throw rugs and other things on the floor that can make you trip. What can I do with my stairs?  Do not leave any items on  the stairs.  Make sure that there are handrails on both sides of the stairs and use them. Fix handrails that are broken or loose. Make sure that handrails are as long as the stairways.  Check any carpeting to make sure that it is firmly attached to the stairs. Fix any carpet that is loose or worn.  Avoid having throw rugs at the top or bottom of the stairs. If you do have throw rugs, attach them to the floor with carpet tape.  Make sure that you have a light switch at the top of the stairs and the bottom of the stairs. If you do not have them, ask someone to add them for you. What else can I do to help prevent falls?  Wear shoes that:  Do not have high heels.  Have rubber bottoms.  Are comfortable and fit you well.  Are closed at the toe. Do not wear sandals.  If you use a stepladder:  Make sure that it is fully opened. Do not climb a closed stepladder.  Make sure that both  sides of the stepladder are locked into place.  Ask someone to hold it for you, if possible.  Clearly mark and make sure that you can see:  Any grab bars or handrails.  First and last steps.  Where the edge of each step is.  Use tools that help you move around (mobility aids) if they are needed. These include:  Canes.  Walkers.  Scooters.  Crutches.  Turn on the lights when you go into a dark area. Replace any light bulbs as soon as they burn out.  Set up your furniture so you have a clear path. Avoid moving your furniture around.  If any of your floors are uneven, fix them.  If there are any pets around you, be aware of where they are.  Review your medicines with your doctor. Some medicines can make you feel dizzy. This can increase your chance of falling. Ask your doctor what other things that you can do to help prevent falls. This information is not intended to replace advice given to you by your health care provider. Make sure you discuss any questions you have with your health care provider. Document Released: 02/08/2009 Document Revised: 09/20/2015 Document Reviewed: 05/19/2014 Elsevier Interactive Patient Education  2017 [employer].

## 2020-07-04 NOTE — Progress Notes (Signed)
Subjective:   Justin Ali is a 80 y.o. male who presents for Medicare Annual/Subsequent preventive examination.  Review of Systems: N/A     I connected with the patient today by telephone and verified that I am speaking with the correct person using two identifiers. Location patient: home Location nurse: work Persons participating in the telephone visit: patient, nurse.   I discussed the limitations, risks, security and privacy concerns of performing an evaluation and management service by telephone and the availability of in person appointments. I also discussed with the patient that there may be a patient responsible charge related to this service. The patient expressed understanding and verbally consented to this telephonic visit.        Cardiac Risk Factors include: advanced age (>42men, >70 women);male gender;hypertension     Objective:    Today's Vitals   There is no height or weight on file to calculate BMI.  Advanced Directives 07/04/2020 06/14/2020 03/14/2020 12/12/2019 09/08/2019 06/30/2019 06/08/2019  Does Patient Have a Medical Advance Directive? Yes Yes Yes No No Yes No  Type of Paramedic of [place];Living will [place];Living will [place];Living will - - [place];Living will -  Does patient want to make changes to medical advance directive? - Yes (ED - Information included in AVS) - - - - -  Copy of [place] in Chart? Yes - validated most recent copy scanned in chart (See row information) - - - - Yes - validated most recent copy scanned in chart (See row information) -  Would patient like information on creating a medical advance directive? - - - No - Patient declined No - Patient declined - No - Patient declined    Current Medications (verified) Outpatient Encounter Medications as of 07/04/2020  Medication Sig   abiraterone acetate (ZYTIGA) 250 MG tablet Take 4  tablets (1,000 mg total) by mouth daily. Take on an empty stomach 1 hour before or 2 hours after a meal   amLODipine (NORVASC) 5 MG tablet Take 1 tablet (5 mg total) by mouth daily.   CALCIUM PO Take 1,200 mg by mouth daily.   cholecalciferol (VITAMIN D) 1000 UNITS tablet Take 2,000 Units by mouth daily.    gabapentin (NEURONTIN) 300 MG capsule Take 1 capsule (300 mg total) by mouth at bedtime.   HYDROcodone-acetaminophen (NORCO/VICODIN) 5-325 MG tablet Take 1 tablet by mouth daily. As needed.   lisinopril (PRINIVIL,ZESTRIL) 5 MG tablet Take 1 tablet (5 mg total) by mouth daily.   Multiple Vitamins-Minerals (CENTRUM SILVER 50+MEN) TABS Take by mouth daily.   Omega-3 Fatty Acids (FISH OIL) 1000 MG CAPS Take by mouth daily.   predniSONE (DELTASONE) 5 MG tablet Take 1 tablet (5 mg total) by mouth daily with breakfast.   temazepam (RESTORIL) 30 MG capsule Take 1 capsule (15 mg total) by mouth at bedtime as needed for sleep.   traMADol (ULTRAM) 50 MG tablet Take 1 tablet (50 mg total) by mouth 2 (two) times daily as needed.   traZODone (DESYREL) 50 MG tablet Take 0.5-1 tablets (25-50 mg total) by mouth at bedtime as needed for sleep.   No facility-administered encounter medications on file as of 07/04/2020.    Allergies (verified) Patient has no known allergies.   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   2 mm - non-bleeding 03/18/2013 colonoscopy    CAD (coronary artery disease), native coronary artery 2014  By CT lung (04/2013)    Cataract    HLD (hyperlipidemia)    HTN (hypertension)    Hyperglycemia    NAFLD (nonalcoholic fatty liver disease) 08/2011   by abd [place] with increased LFTs   OSA (obstructive sleep apnea)    did not tolerate CPAP   Other benign neoplasm of connective and other soft tissue of unspecified site    Neurofibroma of lateral periorbital area   Peripheral neuropathy    Personal history of colonic  adenomas 08/05/2012   Pneumonia 01/2013   CAP LUL   Restless legs syndrome (RLS)    Sleep apnea    no cpap   Past Surgical History:  Procedure Laterality Date   COLONOSCOPY  07/2012   5 adenomas (tubular, TV, serrated), rec rpt 5 months [name])   COLONOSCOPY  02/2013   residual adenomas, cecal AVM, rec rpt 1 yr [name])   COLONOSCOPY  03/2014   no residual polyp, cecal AVM, rpt 3-4 yrs [name])   ECTROPION REPAIR Bilateral 09/29/2017   Procedure: REPAIR OF ECTROPION, EXTENSIVE UPPER AND LOWER;  Surgeon: Karle Starch, MD;  Location: [facility];  Service: Ophthalmology;  Laterality: Bilateral;   ORIF ANKLE FRACTURE  11/29/00   Dr. Tamala Julian   PTOSIS REPAIR Bilateral 09/29/2017   Procedure: BLEPHAROPTOSIS REPAIR RESECT EX UPPER;  Surgeon: Karle Starch, MD;  Location: [facility];  Service: Ophthalmology;  Laterality: Bilateral;   RECONSTRUCTION OF EYELID Bilateral 05/31/2018   SKIN CANCER EXCISION  1997   left anterior neck   Family History  Problem Relation Age of Onset   Cancer Father        liver   Alcohol abuse Father    Liver cancer Father    Cancer Mother        breast   Breast cancer Mother    Hypertension Brother        Half-brother   Coronary artery disease Paternal Aunt        CABG   Cancer Other        GM; Aunt - breast   Colon cancer Neg Hx    Rectal cancer Neg Hx    Stomach cancer Neg Hx    Esophageal cancer Neg Hx    Social History   Socioeconomic History   Marital status: Married    Spouse name: Not on file   Number of children: 2   Years of education: Not on file   Highest education level: Not on file  Occupational History   Occupation: Truck driver    Comment: Museum/gallery exhibitions officer  Tobacco Use   Smoking status: Former Smoker    Types: Cigarettes, Cigars    Quit date: 04/28/2002    Years since quitting: 18.1   Smokeless tobacco: Never Used   Tobacco comment: rare cigar  Vaping Use   Vaping Use: Never used   Substance and Sexual Activity   Alcohol use: Not Currently   Drug use: No   Sexual activity: Not on file  Other Topics Concern   Not on file  Social History Narrative   Caffeine: occasional coffee   Lives with wife, 1 cat   Some care through the [place] - served 8 years in the [employer]   Occupation: truck driver Adult nurse)   Activity: goes to [employer] 3x/wk   Diet: rare water, lots of sweet tea, some fruits/vegetables   Social Determinants of Health   Financial Resource Strain: Low Risk    Difficulty of Paying Living  Expenses: Not hard at all  Food Insecurity: No Food Insecurity   Worried About Charity fundraiser in the Last Year: Never true   Ran Out of Food in the Last Year: Never true  Transportation Needs: No Transportation Needs   Lack of Transportation (Medical): No   Lack of Transportation (Non-Medical): No  Physical Activity: Sufficiently Active   Days of Exercise per Week: 3 days   Minutes of Exercise per Session: 60 min  Stress: No Stress Concern Present   Feeling of Stress : Not at all  Social Connections: Not on file    Tobacco Counseling Counseling given: Not Answered Comment: rare cigar   Clinical Intake:  Pre-visit preparation completed: Yes  Pain : 0-10 Pain Type: Chronic pain Pain Location: Shoulder Pain Orientation: Left,Right Pain Descriptors / Indicators: Aching Pain Onset: More than a month ago Pain Frequency: Intermittent     Nutritional Risks: None Diabetes: No  How often do you need to have someone help you when you read instructions, pamphlets, or other written materials from your doctor or pharmacy?: 1 - Never  Diabetic: No Nutrition Risk Assessment:  Has the patient had any N/V/D within the last 2 months?  No  Does the patient have any non-healing wounds?  No  Has the patient had any unintentional weight loss or weight gain?  No   Diabetes:  Is the patient diabetic?  No  If diabetic, was a CBG obtained today?   N/A Did the patient bring in their glucometer from home?  N/A How often do you monitor your CBG's? N/A.   Financial Strains and Diabetes Management:  Are you having any financial strains with the device, your supplies or your medication? N/A.  Does the patient want to be seen by Chronic Care Management for management of their diabetes?  N/A Would the patient like to be referred to a Nutritionist or for Diabetic Management?  N/A    Interpreter Needed?: No  Information entered by :: CJohnson, LPN   Activities of Daily Living In your present state of health, do you have any difficulty performing the following activities: 07/04/2020  Hearing? Y  Comment wears hearing aids  Vision? N  Difficulty concentrating or making decisions? N  Walking or climbing stairs? N  Dressing or bathing? N  Doing errands, shopping? N  Preparing Food and eating ? N  Using the Toilet? N  In the past six months, have you accidently leaked urine? N  Do you have problems with loss of bowel control? N  Managing your Medications? N  Managing your Finances? N  Housekeeping or managing your Housekeeping? N  Some recent data might be hidden    Patient Care Team: Ria Bush, MD as PCP - General (Family Medicine) Birder Robson, MD as Referring Physician (Ophthalmology) Lloyd Huger, MD as Consulting Physician (Oncology)  Indicate any recent Medical Services you may have received from other than [facility] providers in the past year (date may be approximate).     Assessment:   This is a routine wellness examination for [place].  Hearing/Vision screen  Hearing Screening   125Hz  250Hz  500Hz  1000Hz  2000Hz  3000Hz  4000Hz  6000Hz  8000Hz   Right ear:           Left ear:           Vision Screening Comments: Patient gets annual eye exams   Dietary issues and exercise activities discussed: Current Exercise Habits: Home exercise routine, Type of exercise: strength training/weights, Time (Minutes): 60,  Frequency (  Times/Week): 3, Weekly Exercise (Minutes/Week): 180, Intensity: Moderate, Exercise limited by: None identified  Goals     Patient Stated     Starting 06/22/18, I will continue to take medications as prescribed.      Patient Stated     06/30/2019, I will maintain and continue to take medications as prescribed.     Patient Stated     07/04/2020, I will continue to go to the gym 3 days a week for 1 hour.       Depression Screen PHQ 2/9 Scores 07/04/2020 06/30/2019 06/22/2018 06/16/2017 06/13/2016 06/12/2015 06/07/2014  PHQ - 2 Score 0 0 0 0 0 0 0  PHQ- 9 Score 0 0 0 0 - - -    Fall Risk Fall Risk  07/04/2020 06/30/2019 06/22/2018 06/16/2017 06/13/2016  Falls in the past year? 1 1 0 Yes No  Comment - fell on ice - tripped over rocks and injured left knee -  Number falls in past yr: 0 0 - 1 -  Injury with Fall? 0 0 - Yes -  Risk for fall due to : Medication side effect Medication side effect - - -  Follow up Falls evaluation completed;Falls prevention discussed Falls evaluation completed;Falls prevention discussed - - -    FALL RISK PREVENTION PERTAINING TO THE HOME:  Any stairs in or around the home? Yes  If so, are there any without handrails? No  Home free of loose throw rugs in walkways, pet beds, electrical cords, etc? Yes  Adequate lighting in your home to reduce risk of falls? Yes   ASSISTIVE DEVICES UTILIZED TO PREVENT FALLS:  Life alert? No  Use of a cane, walker or w/c? No  Grab bars in the bathroom? No  Shower chair or bench in shower? No  Elevated toilet seat or a handicapped toilet? No   TIMED UP AND GO:  Was the test performed? N/A telephone visit .  Cognitive Function: MMSE - Mini Mental State Exam 07/04/2020 06/30/2019 06/22/2018 06/16/2017 06/13/2016  Not completed: Refused - - - -  Orientation to time - 5 5 5 5   Orientation to Place - 5 5 5 5   Registration - 3 3 3 3   Attention/ Calculation - 5 0 0 0  Recall - 3 1 3 3   Recall-comments - - unable to recall 2 of 3  words - -  Language- name 2 objects - - 0 0 0  Language- repeat - 1 1 1 1   Language- follow 3 step command - - 3 3 3   Language- read & follow direction - - 0 0 0  Write a sentence - - 0 0 0  Copy design - - 0 0 0  Total score - - 18 20 20   Mini Cog  Mini-Cog screen was completed. Maximum score is 22. A value of 0 denotes this part of the MMSE was not completed or the patient failed this part of the Mini-Cog screening.       Immunizations Immunization History  Administered Date(s) Administered   Fluad Quad(high Dose 65+) 12/28/2018   Influenza Whole 01/24/2008, 01/08/2009, 02/08/2013   Influenza, High Dose Seasonal PF 01/26/2018, 12/28/2018   Influenza, Seasonal, Injecte, Preservative Fre 01/04/2014   Influenza,inj,Quad PF,6+ Mos 01/02/2015, 01/09/2016, 01/02/2017, 01/14/2018   PFIZER(Purple Top)SARS-COV-2 Vaccination 05/12/2019, 06/02/2019, 04/26/2020   Pneumococcal Conjugate-13 06/02/2013   Pneumococcal Polysaccharide-23 06/12/2010   Td 08/27/2002, 04/28/2010   Tdap 04/28/2010    TDAP status: Due, Education has been provided regarding the importance of  this vaccine. Advised may receive this vaccine at local pharmacy or Health Dept. Aware to provide a copy of the vaccination record if obtained from local pharmacy or Health Dept. Verbalized acceptance and understanding.  Flu Vaccine status: Declined, Education has been provided regarding the importance of this vaccine but patient still declined. Advised may receive this vaccine at local pharmacy or Health Dept. Aware to provide a copy of the vaccination record if obtained from local pharmacy or Health Dept. Verbalized acceptance and understanding.  Pneumococcal vaccine status: Up to date  Covid-19 vaccine status: Completed vaccines  Qualifies for Shingles Vaccine? Yes   Zostavax completed No   Shingrix Completed?: No.    Education has been provided regarding the importance of this vaccine. Patient has been advised to  call insurance company to determine out of pocket expense if they have not yet received this vaccine. Advised may also receive vaccine at local pharmacy or Health Dept. Verbalized acceptance and understanding.  Screening Tests Health Maintenance  Topic Date Due   INFLUENZA VACCINE  07/26/2020 (Originally 11/27/2019)   COLONOSCOPY (Pts 45-63yrs Insurance coverage will need to be confirmed)  07/05/2022 (Originally 03/22/2017)   TETANUS/TDAP  07/05/2023 (Originally 04/28/2020)   COVID-19 Vaccine (4 - Booster for Blanket series) 10/25/2020   Hepatitis C Screening  Completed   PNA vac Low Risk Adult  Completed   HPV VACCINES  Aged Out    Health Maintenance:  There are no preventive care reminders to display for this patient.    Colorectal cancer screening: declined  Lung Cancer Screening: (Low Dose CT Chest recommended if Age 17-80 years, 30 pack-year currently smoking OR have quit w/in 15 years) does not qualify.    Additional Screening:  Hepatitis C Screening: does qualify; Completed 09/01/2011  Vision Screening: Recommended annual ophthalmology exams for early detection of glaucoma and other disorders of the eye. Is the patient up to date with their annual eye exam?  Yes  Who is the provider or what is the name of the office in which the patient attends annual eye exams? [facility] If pt is not established with a provider, would they like to be referred to a provider to establish care? No .   Dental Screening: Recommended annual dental exams for proper oral hygiene  Community Resource Referral / Chronic Care Management: CRR required this visit?  No   CCM required this visit?  No      Plan:     I have personally reviewed and noted the following in the patients chart:    Medical and social history  Use of alcohol, tobacco or illicit drugs   Current medications and supplements  Functional ability and status  Nutritional status  Physical activity  Advanced  directives  List of other physicians  Hospitalizations, surgeries, and ER visits in previous 12 months  Vitals  Screenings to include cognitive, depression, and falls  Referrals and appointments  In addition, I have reviewed and discussed with patient certain preventive protocols, quality metrics, and best practice recommendations. A written personalized care plan for preventive services as well as general preventive health recommendations were provided to patient.   Due to this being a telephonic visit, the after visit summary with patients personalized plan was offered to patient via office or my-chart. Patient preferred to pick up at office at next visit or via mychart.   Andrez Grime, LPN   10/27/6201

## 2020-07-05 ENCOUNTER — Other Ambulatory Visit: Payer: Self-pay | Admitting: Family Medicine

## 2020-07-05 ENCOUNTER — Other Ambulatory Visit: Payer: Self-pay

## 2020-07-05 ENCOUNTER — Other Ambulatory Visit (INDEPENDENT_AMBULATORY_CARE_PROVIDER_SITE_OTHER): Payer: No Typology Code available for payment source

## 2020-07-05 ENCOUNTER — Ambulatory Visit: Payer: Non-veteran care

## 2020-07-05 DIAGNOSIS — E785 Hyperlipidemia, unspecified: Secondary | ICD-10-CM | POA: Diagnosis not present

## 2020-07-05 DIAGNOSIS — G2581 Restless legs syndrome: Secondary | ICD-10-CM | POA: Diagnosis not present

## 2020-07-05 DIAGNOSIS — C7951 Secondary malignant neoplasm of bone: Secondary | ICD-10-CM

## 2020-07-05 DIAGNOSIS — C61 Malignant neoplasm of prostate: Secondary | ICD-10-CM

## 2020-07-05 LAB — LIPID PANEL
Cholesterol: 134 mg/dL (ref 0–200)
HDL: 26.8 mg/dL — ABNORMAL LOW (ref >39.00)
LDL Cholesterol: 68 mg/dL (ref 0–99)
NonHDL: 106.88
Total CHOL/HDL Ratio: 5
Triglycerides: 192 mg/dL — ABNORMAL HIGH (ref 0.0–149.0)
VLDL: 38.4 mg/dL (ref 0.0–40.0)

## 2020-07-05 LAB — IBC PANEL
Iron: 82 ug/dL (ref 42–165)
Saturation Ratios: 26.4 % (ref 20.0–50.0)
Transferrin: 222 mg/dL (ref 212.0–360.0)

## 2020-07-05 LAB — FERRITIN: Ferritin: 40.1 ng/mL (ref 22.0–322.0)

## 2020-07-05 LAB — TSH: TSH: 1.29 u[IU]/mL (ref 0.35–4.50)

## 2020-07-12 ENCOUNTER — Encounter: Payer: Non-veteran care | Admitting: Family Medicine

## 2020-07-13 ENCOUNTER — Encounter: Payer: Self-pay | Admitting: Family Medicine

## 2020-07-13 ENCOUNTER — Other Ambulatory Visit: Payer: Self-pay

## 2020-07-13 ENCOUNTER — Ambulatory Visit (INDEPENDENT_AMBULATORY_CARE_PROVIDER_SITE_OTHER): Payer: Medicare Other | Admitting: Family Medicine

## 2020-07-13 VITALS — BP 138/80 | HR 82 | Temp 97.9°F | Ht 65.5 in | Wt 190.3 lb

## 2020-07-13 DIAGNOSIS — B356 Tinea cruris: Secondary | ICD-10-CM

## 2020-07-13 DIAGNOSIS — C61 Malignant neoplasm of prostate: Secondary | ICD-10-CM

## 2020-07-13 DIAGNOSIS — K5520 Angiodysplasia of colon without hemorrhage: Secondary | ICD-10-CM | POA: Diagnosis not present

## 2020-07-13 DIAGNOSIS — I2510 Atherosclerotic heart disease of native coronary artery without angina pectoris: Secondary | ICD-10-CM | POA: Diagnosis not present

## 2020-07-13 DIAGNOSIS — Z860100 Personal history of colon polyps, unspecified: Secondary | ICD-10-CM

## 2020-07-13 DIAGNOSIS — E66811 Obesity, class 1: Secondary | ICD-10-CM

## 2020-07-13 DIAGNOSIS — I10 Essential (primary) hypertension: Secondary | ICD-10-CM

## 2020-07-13 DIAGNOSIS — Z0000 Encounter for general adult medical examination without abnormal findings: Secondary | ICD-10-CM | POA: Diagnosis not present

## 2020-07-13 DIAGNOSIS — E785 Hyperlipidemia, unspecified: Secondary | ICD-10-CM | POA: Diagnosis not present

## 2020-07-13 DIAGNOSIS — C7951 Secondary malignant neoplasm of bone: Secondary | ICD-10-CM | POA: Diagnosis not present

## 2020-07-13 DIAGNOSIS — F5104 Psychophysiologic insomnia: Secondary | ICD-10-CM

## 2020-07-13 DIAGNOSIS — E669 Obesity, unspecified: Secondary | ICD-10-CM

## 2020-07-13 MED ORDER — TEMAZEPAM 30 MG PO CAPS: 30.0000 mg | ORAL_CAPSULE | Freq: Every day | ORAL | 3 refills | Status: DC

## 2020-07-13 MED ORDER — TRAZODONE HCL 50 MG PO TABS: 50.0000 mg | ORAL_TABLET | Freq: Every day | ORAL | 3 refills | Status: DC

## 2020-07-13 MED ORDER — FISH OIL 1000 MG PO CAPS: 2.0000 | ORAL_CAPSULE | Freq: Every day | ORAL | Status: DC

## 2020-07-13 MED ORDER — CLOTRIMAZOLE 1 % EX CREA: 1.0000 "application " | TOPICAL_CREAM | Freq: Two times a day (BID) | CUTANEOUS | 0 refills | Status: DC

## 2020-07-13 NOTE — Assessment & Plan Note (Signed)
H/o this. Monitor for blood in stool.

## 2020-07-13 NOTE — Assessment & Plan Note (Addendum)
Latest EKG reviewed (2019). Update EKG next visit.

## 2020-07-13 NOTE — Assessment & Plan Note (Signed)
Declines further colonoscopy, reasonable in setting of actively treating prostate cancer.

## 2020-07-13 NOTE — Progress Notes (Signed)
Patient ID: Justin Ali, male    DOB: 1940/12/01, 80 y.o.   MRN: [id number]  This visit was conducted in person.  BP 138/80   Pulse 82   Temp 97.9 F (36.6 C) (Temporal)   Ht 5' 5.5" (1.664 m)   Wt 190 lb 5 oz (86.3 kg)   SpO2 96%   BMI 31.19 kg/m    CC: CPE Subjective:   HPI: Justin Ali is a 80 y.o. male presenting on 07/13/2020 for Annual Exam   Saw health advisor last week for medicare wellness visit. Note reviewed.    No exam data present  Flowsheet Row Clinical Support from 07/04/2020 in [place] at [facility] Total Score 0      Fall Risk  07/04/2020 06/30/2019 06/22/2018 06/16/2017 06/13/2016  Falls in the past year? 1 1 0 Yes No  Comment - fell on ice - tripped over rocks and injured left knee -  Number falls in past yr: 0 0 - 1 -  Injury with Fall? 0 0 - Yes -  Risk for fall due to : Medication side effect Medication side effect - - -  Follow up Falls evaluation completed;Falls prevention discussed Falls evaluation completed;Falls prevention discussed - - -  [place] out of bed at home 05/2020, hit his head but fortunately no injury.   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   Chronic insomnia managed with restoril 30mg  daily + trazodone 50mg  nightly.   Groin rash treated with wife's OTC lotion.   Antihypertensives and gabapentin through [place].   Preventative: COLONOSCOPY Date: 03/2014 no residual polyp, cecal AVM, rpt 3-4 yrs [name]). Pt has decided tostop screening. No blood in stool.  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  Lung cancer screening - not eligible. Flu shot yearly [place] 04/2019, 05/2019, booster 03/2020 Td - 2012  Pneumovax 2012, prevnar 2015 Shingrix - discussed - not interested Advanced directives -Received advanced directives, scanned  05/2015. DNR. No prolonged life support if terminal condition. Does not want feeding tube. No HCPOA form.Wife would be HCPOA. Wants to be [facility] in [facility].  Seat belt use discussed. Sunscreen use discussed. Lots of sun exposure. Itchy SK on back.  Ex smoker - quit 2004. Mainly cigars. Alcohol -stopped 03/2017 Dentist seen 20 21 Eye exam regular Bowel - no constipation  Bladder - no incontinence   Caffeine: occasional coffee  Lives with wife, 1 cat  Occupation: truck driver Adult nurse)  Activity: goes to the gym 3-4x/wk Diet: good water, some fruits/vegetables     Relevant past medical, surgical, family and social history reviewed and updated as indicated. Interim medical history since our last visit reviewed. Allergies and medications reviewed and updated. Outpatient Medications Prior to Visit  Medication Sig Dispense Refill  . abiraterone acetate (ZYTIGA) 250 MG tablet Take 4 tablets (1,000 mg total) by mouth daily. Take on an empty stomach 1 hour before or 2 hours after a meal 120 tablet 5  . amLODipine (NORVASC) 5 MG tablet Take 1 tablet (5 mg total) by mouth daily. 90 tablet 3  . CALCIUM PO Take 1,200 mg by mouth daily.    . cholecalciferol (VITAMIN D) 1000 UNITS tablet Take 2,000 Units by mouth daily.     Marland Kitchen gabapentin (NEURONTIN) 300 MG capsule Take 1 capsule (300 mg  total) by mouth at bedtime. 90 capsule 3  . HYDROcodone-acetaminophen (NORCO/VICODIN) 5-325 MG tablet Take 1 tablet by mouth daily. As needed. 60 tablet 0  . lisinopril (PRINIVIL,ZESTRIL) 5 MG tablet Take 1 tablet (5 mg total) by mouth daily. 90 tablet 3  . Multiple Vitamins-Minerals (CENTRUM SILVER 50+MEN) TABS Take by mouth daily.    . predniSONE (DELTASONE) 5 MG tablet Take 1 tablet (5 mg total) by mouth daily with breakfast. 30 tablet 5  . traMADol (ULTRAM) 50 MG tablet Take 1 tablet (50 mg total) by mouth 2 (two) times daily as needed. 30 tablet 3  . Omega-3 Fatty Acids (FISH OIL) 1000 MG  CAPS Take by mouth daily.    . temazepam (RESTORIL) 30 MG capsule Take 1 capsule (15 mg total) by mouth at bedtime as needed for sleep. 30 capsule 0  . traZODone (DESYREL) 50 MG tablet Take 0.5-1 tablets (25-50 mg total) by mouth at bedtime as needed for sleep. 30 tablet 6   No facility-administered medications prior to visit.     Per HPI unless specifically indicated in ROS section below Review of Systems  Constitutional: Negative for activity change, appetite change, chills, fatigue, fever and unexpected weight change.  HENT: Negative for hearing loss.   Eyes: Negative for visual disturbance.  Respiratory: Negative for cough, chest tightness, shortness of breath and wheezing.   Cardiovascular: Negative for chest pain, palpitations and leg swelling.  Gastrointestinal: Positive for diarrhea. Negative for abdominal distention, abdominal pain, blood in stool, constipation, nausea and vomiting.  Genitourinary: Negative for difficulty urinating and hematuria.  Musculoskeletal: Negative for arthralgias, myalgias and neck pain.  Skin: Negative for rash.  Neurological: Negative for dizziness, seizures, syncope and headaches.  Hematological: Negative for adenopathy. Does not bruise/bleed easily.  Psychiatric/Behavioral: Negative for dysphoric mood. The patient is not nervous/anxious.    Objective:  BP 138/80   Pulse 82   Temp 97.9 F (36.6 C) (Temporal)   Ht 5' 5.5" (1.664 m)   Wt 190 lb 5 oz (86.3 kg)   SpO2 96%   BMI 31.19 kg/m   Wt Readings from Last 3 Encounters:  07/13/20 190 lb 5 oz (86.3 kg)  06/14/20 194 lb 12.8 oz (88.4 kg)  03/14/20 193 lb 5 oz (87.7 kg)      Physical Exam Vitals and nursing note reviewed.  Constitutional:      General: He is not in acute distress.    Appearance: Normal appearance. He is well-developed. He is not ill-appearing.  HENT:     Head: Normocephalic and atraumatic.     Right Ear: Hearing, tympanic membrane, ear canal and external ear normal.      Left Ear: Hearing, tympanic membrane, ear canal and external ear normal.  Eyes:     General: No scleral icterus.    Extraocular Movements: Extraocular movements intact.     Conjunctiva/sclera: Conjunctivae normal.     Pupils: Pupils are equal, round, and reactive to light.  Neck:     Thyroid: No thyroid mass or thyromegaly.     Vascular: No carotid bruit.  Cardiovascular:     Rate and Rhythm: Normal rate and regular rhythm.     Pulses: Normal pulses.          Radial pulses are 2+ on the right side and 2+ on the left side.     Heart sounds: Normal heart sounds. No murmur heard.   Pulmonary:     Effort: Pulmonary effort is normal. No respiratory distress.  Breath sounds: Normal breath sounds. No wheezing, rhonchi or rales.  Abdominal:     General: Abdomen is flat. Bowel sounds are normal. There is no distension.     Palpations: Abdomen is soft. There is no mass.     Tenderness: There is no abdominal tenderness. There is no guarding or rebound.     Hernia: No hernia is present.  Musculoskeletal:        General: Normal range of motion.     Cervical back: Normal range of motion and neck supple.     Right lower leg: No edema.     Left lower leg: No edema.  Lymphadenopathy:     Cervical: No cervical adenopathy.  Skin:    General: Skin is warm and dry.     Findings: No rash.  Neurological:     General: No focal deficit present.     Mental Status: He is alert and oriented to person, place, and time.     Comments: CN grossly intact, station and gait intact  Psychiatric:        Mood and Affect: Mood normal.        Behavior: Behavior normal.        Thought Content: Thought content normal.        Judgment: Judgment normal.       Results for orders placed or performed in visit on 07/05/20  Ferritin  Result Value Ref Range   Ferritin 40.1 22.0 - 322.0 ng/mL  IBC panel  Result Value Ref Range   Iron 82 42 - 165 ug/dL   Transferrin 222.0 212.0 - 360.0 mg/dL   Saturation  Ratios 26.4 20.0 - 50.0 %  TSH  Result Value Ref Range   TSH 1.29 0.35 - 4.50 uIU/mL  Lipid panel  Result Value Ref Range   Cholesterol 134 0 - 200 mg/dL   Triglycerides 192.0 (H) 0.0 - 149.0 mg/dL   HDL 26.80 (L) >39.00 mg/dL   VLDL 38.4 0.0 - 40.0 mg/dL   LDL Cholesterol 68 0 - 99 mg/dL   Total CHOL/HDL Ratio 5    NonHDL 106.88    Assessment & Plan:  This visit occurred during the SARS-CoV-2 public health emergency.  Safety protocols were in place, including screening questions prior to the visit, additional usage of staff PPE, and extensive cleaning of exam room while observing appropriate contact time as indicated for disinfecting solutions.   Problem List Items Addressed This Visit    Dyslipidemia    Chronic, off meds. Trig levels improving. Discussed fish oil increase as he doesn't like fish.  The 10-year ASCVD risk score Mikey Bussing DC Brooke Bonito., et al., 2013) is: 40.2%   Values used to calculate the score:     Age: 62 years     Sex: Male     Is Non-Hispanic African American: No     Diabetic: No     Tobacco smoker: Yes     Systolic Blood Pressure: 315 mmHg     Is BP treated: Yes     HDL Cholesterol: 26.8 mg/dL     Total Cholesterol: 134 mg/dL       HYPERTENSION, BENIGN ESSENTIAL    Chronic, stable. Continue current regimen through the [place] maintenance examination - Primary    Preventative protocols reviewed and updated unless pt declined. Discussed healthy diet and lifestyle.       Personal history of colonic adenomas    Declines further colonoscopy, reasonable in setting of  actively treating prostate cancer.      Angiodysplasia of cecum    H/o this. Monitor for blood in stool.       CAD (coronary artery disease), native coronary artery    Latest EKG reviewed (2019). Update EKG next visit.       Obesity, Class I, BMI 30-34.9    Encouraged healthy diet and lifestyle.       Primary malignant 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 [facility])    Appreciate onc  care, sees Q73mo .      Chronic insomnia    Has not done well with lower dose temazepam - desires to continue 30mg  dose. Discussed risks of long term benzo use including memory loss, increased fall risk, dependence. Continue trazodone 50mg  nightly as well.       Tinea cruris    Anticipate tinea cruris, doubt erythrasma.  Rx lotrimin BID x 2+ wks OTC. Update if not improved with this.       Relevant Medications   clotrimazole (LOTRIMIN) 1 % cream       Meds ordered this encounter  Medications  . temazepam (RESTORIL) 30 MG capsule    Sig: Take 1 capsule (30 mg total) by mouth at bedtime.    Dispense:  30 capsule    Refill:  3  . traZODone (DESYREL) 50 MG tablet    Sig: Take 1 tablet (50 mg total) by mouth at bedtime.    Dispense:  90 tablet    Refill:  3  . clotrimazole (LOTRIMIN) 1 % cream    Sig: Apply 1 application topically 2 (two) times daily. For 2 weeks or until full resolution    Dispense:  30 g    Refill:  0  . Omega-3 Fatty Acids (FISH OIL) 1000 MG CAPS    Sig: Take 2 capsules (2,000 mg total) by mouth daily.   No orders of the defined types were placed in this encounter.   Patient instructions: For groin rash - treat with lotrmin for 2 weeks twice daily to area. Let us know if not better with this.  Continue current medicines. Temazepam and trazodone refilled.  Return as needed or in 1 year for next physical/wellness visit   Follow up plan: Return in about 1 year (around 07/13/2021) for annual exam, prior fasting for blood work, medicare wellness visit.  Ria Bush, MD

## 2020-07-13 NOTE — Assessment & Plan Note (Signed)
Chronic, stable. Continue current regimen through the [place]

## 2020-07-13 NOTE — Assessment & Plan Note (Signed)
Anticipate tinea cruris, doubt erythrasma.  Rx lotrimin BID x 2+ wks OTC. Update if not improved with this.

## 2020-07-13 NOTE — Assessment & Plan Note (Signed)
Preventative protocols reviewed and updated unless pt declined. Discussed healthy diet and lifestyle.  

## 2020-07-13 NOTE — Assessment & Plan Note (Addendum)
Has not done well with lower dose temazepam - desires to continue 30mg  dose. Discussed risks of long term benzo use including memory loss, increased fall risk, dependence. Continue trazodone 50mg  nightly as well.

## 2020-07-13 NOTE — Assessment & Plan Note (Signed)
Appreciate onc care, sees Q72mo .

## 2020-07-13 NOTE — Assessment & Plan Note (Signed)
Encouraged healthy diet and lifestyle.  

## 2020-07-13 NOTE — Patient Instructions (Addendum)
For groin rash - treat with lotrmin for 2 weeks twice daily to area. Let us know if not better with this.  Continue current medicines. Temazepam and trazodone refilled.  Return as needed or in 1 year for next physical/wellness visit   Health Maintenance After Age 79 After age 87, you are at a higher risk for certain long-term diseases and infections as well as injuries from falls. Falls are a major cause of broken bones and head injuries in people who are older than age 57. Getting regular preventive care can help to keep you healthy and well. Preventive care includes getting regular testing and making lifestyle changes as recommended by your health care provider. Talk with your health care provider about:  Which screenings and tests you should have. A screening is a test that checks for a disease when you have no symptoms.  A diet and exercise plan that is right for you. What should I know about screenings and tests to prevent falls? Screening and testing are the best ways to find a health problem early. Early diagnosis and treatment give you the best chance of managing medical conditions that are common after age 26. Certain conditions and lifestyle choices may make you more likely to have a fall. Your health care provider may recommend:  Regular vision checks. Poor vision and conditions such as cataracts can make you more likely to have a fall. If you wear glasses, make sure to get your prescription updated if your vision changes.  Medicine review. Work with your health care provider to regularly review all of the medicines you are taking, including over-the-counter medicines. Ask your health care provider about any side effects that may make you more likely to have a fall. Tell your health care provider if any medicines that you take make you feel dizzy or sleepy.  Osteoporosis screening. Osteoporosis is a condition that causes the bones to get weaker. This can make the bones weak and cause  them to break more easily.  Blood pressure screening. Blood pressure changes and medicines to control blood pressure can make you feel dizzy.  Strength and balance checks. Your health care provider may recommend certain tests to check your strength and balance while standing, walking, or changing positions.  Foot health exam. Foot pain and numbness, as well as not wearing proper footwear, can make you more likely to have a fall.  Depression screening. You may be more likely to have a fall if you have a fear of falling, feel emotionally low, or feel unable to do activities that you used to do.  Alcohol use screening. Using too much alcohol can affect your balance and may make you more likely to have a fall. What actions can I take to lower my risk of falls? General instructions  Talk with your health care provider about your risks for falling. Tell your health care provider if: ? You fall. Be sure to tell your health care provider about all falls, even ones that seem minor. ? You feel dizzy, sleepy, or off-balance.  Take over-the-counter and prescription medicines only as told by your health care provider. These include any supplements.  Eat a healthy diet and maintain a healthy weight. A healthy diet includes low-fat dairy products, low-fat (lean) meats, and fiber from whole grains, beans, and lots of fruits and vegetables. Home safety  Remove any tripping hazards, such as rugs, cords, and clutter.  Install safety equipment such as grab bars in bathrooms and safety rails on stairs.  Keep rooms and walkways well-lit. Activity  Follow a regular exercise program to stay fit. This will help you maintain your balance. Ask your health care provider what types of exercise are appropriate for you.  If you need a cane or walker, use it as recommended by your health care provider.  Wear supportive shoes that have nonskid soles.   Lifestyle  Do not drink alcohol if your health care provider  tells you not to drink.  If you drink alcohol, limit how much you have: ? 0-1 drink a day for women. ? 0-2 drinks a day for men.  Be aware of how much alcohol is in your drink. In the [place] one drink equals one typical bottle of beer (12 oz), one-half glass of wine (5 oz), or one shot of hard liquor (1 oz).  Do not use any products that contain nicotine or tobacco, such as cigarettes and e-cigarettes. If you need help quitting, ask your health care provider. Summary  Having a healthy lifestyle and getting preventive care can help to protect your health and wellness after age 65.  Screening and testing are the best way to find a health problem early and help you avoid having a fall. Early diagnosis and treatment give you the best chance for managing medical conditions that are more common for people who are older than age 50.  Falls are a major cause of broken bones and head injuries in people who are older than age 76. Take precautions to prevent a fall at home.  Work with your health care provider to learn what changes you can make to improve your health and wellness and to prevent falls. This information is not intended to replace advice given to you by your health care provider. Make sure you discuss any questions you have with your health care provider. Document Revised: 08/05/2018 Document Reviewed: 02/25/2017 Elsevier Patient Education  2021 [employer].

## 2020-07-13 NOTE — Assessment & Plan Note (Signed)
Chronic, off meds. Trig levels improving. Discussed fish oil increase as he doesn't like fish.  The 10-year ASCVD risk score Mikey Bussing DC Brooke Bonito., et al., 2013) is: 40.2%   Values used to calculate the score:     Age: 80 years     Sex: Male     Is Non-Hispanic African American: No     Diabetic: No     Tobacco smoker: Yes     Systolic Blood Pressure: 436 mmHg     Is BP treated: Yes     HDL Cholesterol: 26.8 mg/dL     Total Cholesterol: 134 mg/dL

## 2020-09-07 ENCOUNTER — Other Ambulatory Visit: Payer: Self-pay

## 2020-09-10 ENCOUNTER — Inpatient Hospital Stay: Payer: No Typology Code available for payment source | Admitting: Pharmacist

## 2020-09-10 ENCOUNTER — Inpatient Hospital Stay (HOSPITAL_BASED_OUTPATIENT_CLINIC_OR_DEPARTMENT_OTHER): Payer: No Typology Code available for payment source | Admitting: Oncology

## 2020-09-10 ENCOUNTER — Encounter: Payer: Self-pay | Admitting: Oncology

## 2020-09-10 ENCOUNTER — Inpatient Hospital Stay: Payer: No Typology Code available for payment source

## 2020-09-10 ENCOUNTER — Inpatient Hospital Stay: Payer: No Typology Code available for payment source | Attending: Oncology

## 2020-09-10 ENCOUNTER — Other Ambulatory Visit: Payer: Self-pay

## 2020-09-10 VITALS — BP 121/79 | HR 64 | Temp 97.5°F | Resp 17 | Ht 65.5 in | Wt 187.7 lb

## 2020-09-10 DIAGNOSIS — C61 Malignant neoplasm of prostate: Secondary | ICD-10-CM

## 2020-09-10 DIAGNOSIS — C7951 Secondary malignant neoplasm of bone: Secondary | ICD-10-CM

## 2020-09-10 LAB — CBC WITH DIFFERENTIAL/PLATELET
Abs Immature Granulocytes: 0.03 10*3/uL (ref 0.00–0.07)
Basophils Absolute: 0 10*3/uL (ref 0.0–0.1)
Basophils Relative: 1 %
Eosinophils Absolute: 0.2 10*3/uL (ref 0.0–0.5)
Eosinophils Relative: 5 %
HCT: 34.3 % — ABNORMAL LOW (ref 39.0–52.0)
Hemoglobin: 12.1 g/dL — ABNORMAL LOW (ref 13.0–17.0)
Immature Granulocytes: 1 %
Lymphocytes Relative: 20 %
Lymphs Abs: 0.9 10*3/uL (ref 0.7–4.0)
MCH: 31.3 pg (ref 26.0–34.0)
MCHC: 35.3 g/dL (ref 30.0–36.0)
MCV: 88.6 fL (ref 80.0–100.0)
Monocytes Absolute: 0.2 10*3/uL (ref 0.1–1.0)
Monocytes Relative: 5 %
Neutro Abs: 3.2 10*3/uL (ref 1.7–7.7)
Neutrophils Relative %: 68 %
Platelets: 171 10*3/uL (ref 150–400)
RBC: 3.87 MIL/uL — ABNORMAL LOW (ref 4.22–5.81)
RDW: 13.4 % (ref 11.5–15.5)
WBC: 4.6 10*3/uL (ref 4.0–10.5)
nRBC: 0 % (ref 0.0–0.2)

## 2020-09-10 LAB — COMPREHENSIVE METABOLIC PANEL
ALT: 10 U/L (ref 0–44)
AST: 17 U/L (ref 15–41)
Albumin: 3.6 g/dL (ref 3.5–5.0)
Alkaline Phosphatase: 53 U/L (ref 38–126)
Anion gap: 8 (ref 5–15)
BUN: 19 mg/dL (ref 8–23)
CO2: 26 mmol/L (ref 22–32)
Calcium: 8.8 mg/dL — ABNORMAL LOW (ref 8.9–10.3)
Chloride: 107 mmol/L (ref 98–111)
Creatinine, Ser: 1.61 mg/dL — ABNORMAL HIGH (ref 0.61–1.24)
GFR, Estimated: 44 mL/min — ABNORMAL LOW (ref >60)
Glucose, Bld: 140 mg/dL — ABNORMAL HIGH (ref 70–99)
Potassium: 3.2 mmol/L — ABNORMAL LOW (ref 3.5–5.1)
Sodium: 141 mmol/L (ref 135–145)
Total Bilirubin: 0.9 mg/dL (ref 0.3–1.2)
Total Protein: 6.4 g/dL — ABNORMAL LOW (ref 6.5–8.1)

## 2020-09-10 LAB — PSA: Prostatic Specific Antigen: 0.92 ng/mL (ref 0.00–4.00)

## 2020-09-10 MED ORDER — DENOSUMAB 120 MG/1.7ML ~~LOC~~ SOLN
120.0000 mg | Freq: Once | SUBCUTANEOUS | Status: AC
  Administered 2020-09-10: 120 mg via SUBCUTANEOUS
  Filled 2020-09-10: qty 1.7

## 2020-09-10 MED ORDER — POTASSIUM CHLORIDE CRYS ER 20 MEQ PO TBCR: 20.0000 meq | EXTENDED_RELEASE_TABLET | Freq: Two times a day (BID) | ORAL | 0 refills | Status: DC

## 2020-09-10 NOTE — Progress Notes (Signed)
[facility]  [phone] [phone]  Patient Care Team: Ria Bush, MD as PCP - General (Family Medicine) Birder Robson, MD as Referring Physician (Ophthalmology) Lloyd Huger, MD as Consulting Physician (Oncology)   Name of the patient: Justin Ali  [id number]  [birth year]   Date of visit: 09/10/20  HPI: Patient is a 80 y.o. male with metastatic prostate cancer. Currently treated with Zytiga (abiraterone) and prednisone  Reason for Consult: Oral chemotherapy follow-up for abiraterone therapy.   PAST MEDICAL HISTORY: Past Medical History:  Diagnosis Date  . 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 CAD (coronary artery disease), native coronary artery 2014   By CT lung (04/2013)   . Cataract   . HLD (hyperlipidemia)   . HTN (hypertension)   . Hyperglycemia   . NAFLD (nonalcoholic fatty liver disease) 08/2011   by abd [place] with increased LFTs  . OSA (obstructive sleep apnea)    did not tolerate CPAP  . Other benign neoplasm of connective and other soft tissue of unspecified site    Neurofibroma of lateral periorbital area  . Peripheral neuropathy   . Personal history of colonic adenomas 08/05/2012  . Pneumonia 01/2013   CAP LUL  . Restless legs syndrome (RLS)   . Sleep apnea    no cpap    HEMATOLOGY/ONCOLOGY HISTORY:  Oncology History   No history exists.    ALLERGIES:  has No Known Allergies.  MEDICATIONS:  Current Outpatient Medications  Medication Sig Dispense Refill  . abiraterone acetate (ZYTIGA) 250 MG tablet Take 4 tablets (1,000 mg total) by mouth daily. Take on an empty stomach 1 hour before or 2 hours after a meal 120 tablet 5  . amLODipine (NORVASC) 5 MG tablet Take 1 tablet (5 mg total) by mouth daily. 90 tablet 3  . CALCIUM PO Take 1,200 mg by mouth daily.    . cholecalciferol (VITAMIN D) 1000 UNITS tablet Take 2,000 Units  by mouth daily.     . clotrimazole (LOTRIMIN) 1 % cream Apply 1 application topically 2 (two) times daily. For 2 weeks or until full resolution 30 g 0  . gabapentin (NEURONTIN) 300 MG capsule Take 1 capsule (300 mg total) by mouth at bedtime. 90 capsule 3  . HYDROcodone-acetaminophen (NORCO/VICODIN) 5-325 MG tablet Take 1 tablet by mouth daily. As needed. 60 tablet 0  . lisinopril (PRINIVIL,ZESTRIL) 5 MG tablet Take 1 tablet (5 mg total) by mouth daily. 90 tablet 3  . Multiple Vitamins-Minerals (CENTRUM SILVER 50+MEN) TABS Take by mouth daily.    . Omega-3 Fatty Acids (FISH OIL) 1000 MG CAPS Take 2 capsules (2,000 mg total) by mouth daily.    . predniSONE (DELTASONE) 5 MG tablet Take 1 tablet (5 mg total) by mouth daily with breakfast. 30 tablet 5  . temazepam (RESTORIL) 30 MG capsule Take 1 capsule (30 mg total) by mouth at bedtime. 30 capsule 3  . traMADol (ULTRAM) 50 MG tablet Take 1 tablet (50 mg total) by mouth 2 (two) times daily as needed. 30 tablet 3  . traZODone (DESYREL) 50 MG tablet Take 1 tablet (50 mg total) by mouth at bedtime. 90 tablet 3   No current facility-administered medications for this visit.    VITAL SIGNS: There were no vitals taken for this visit. There were no vitals filed for this visit.  Estimated body mass index is 31.19 kg/m as calculated from the following:  Height as of 07/13/20: 5' 5.5" (1.664 m).   Weight as of 07/13/20: 86.3 kg (190 lb 5 oz).  LABS: CBC:    Component Value Date/Time   WBC 5.0 06/14/2020 1501   HGB 12.9 (L) 06/14/2020 1501   HGB 12.3 (L) 02/16/2013 0523   HCT 38.0 (L) 06/14/2020 1501   HCT 35.6 (L) 02/16/2013 0523   PLT 168 06/14/2020 1501   PLT 166 02/16/2013 0523   MCV 89.6 06/14/2020 1501   MCV 90 02/16/2013 0523   NEUTROABS 3.5 06/14/2020 1501   NEUTROABS 6.8 (H) 02/16/2013 0523   LYMPHSABS 1.1 06/14/2020 1501   LYMPHSABS 1.1 02/16/2013 0523   MONOABS 0.3 06/14/2020 1501   MONOABS 0.8 02/16/2013 0523   EOSABS 0.1  06/14/2020 1501   EOSABS 0.1 02/16/2013 0523   BASOSABS 0.0 06/14/2020 1501   BASOSABS 0.0 02/16/2013 0523   Comprehensive Metabolic Panel:    Component Value Date/Time   NA 140 06/14/2020 1501   NA 142 02/16/2013 0523   K 4.4 06/14/2020 1501   K 4.0 02/16/2013 0523   CL 105 06/14/2020 1501   CL 111 (H) 02/16/2013 0523   CO2 27 06/14/2020 1501   CO2 24 02/16/2013 0523   BUN 14 06/14/2020 1501   BUN 14 02/16/2013 0523   CREATININE 1.06 06/14/2020 1501   CREATININE 1.60 (H) 02/16/2013 0523   GLUCOSE 123 (H) 06/14/2020 1501   GLUCOSE 91 02/16/2013 0523   CALCIUM 8.7 (L) 06/14/2020 1501   CALCIUM 8.5 02/16/2013 0523   AST 18 06/14/2020 1501   AST 12 (L) 02/14/2013 1857   ALT 13 06/14/2020 1501   ALT 19 02/14/2013 1857   ALKPHOS 56 06/14/2020 1501   ALKPHOS 133 02/14/2013 1857   BILITOT 0.6 06/14/2020 1501   BILITOT 1.0 02/14/2013 1857   PROT 6.4 (L) 06/14/2020 1501   PROT 7.0 02/14/2013 1857   ALBUMIN 3.8 06/14/2020 1501   ALBUMIN 3.3 (L) 02/14/2013 1857     Present during today's visit: [name]  Assessment and Plan: Continue abiraterone and prednisone - BP well controlled - Hypokalemia at 3.28mmol/L, Sonia Baller, NP is sending in a prescription for potassium to his local pharmacy   Oral Chemotherapy Side Effect/Intolerance:  No reported side effects. He is able to keep up with his normal daily activities.   Other: Encouraged [name] to wear sunscreen while outside  Oral Chemotherapy Adherence: reported no missed doses so far this month No patient barriers to medication adherence identified.   New medications: None reported  Medication Access Issues: Fills abiraterone and prednisone at the [facility], has refills on hand  Patient expressed understanding and was in agreement with this plan. He also understands that He can call clinic at any time with any questions, concerns, or complaints.   Thank you for allowing me to participate in the care of this very  pleasant patient.   Time Total: 15 mins  Visit consisted of counseling and education on dealing with issues of symptom management in the setting of serious and potentially life-threatening illness.Greater than 50%  of this time was spent counseling and coordinating care related to the above assessment and plan.  Signed by: Darl Pikes, PharmD, BCPS, Salley Slaughter, CPP Hematology/Oncology Clinical Pharmacist Practitioner [facility]/HP/AP [facility] [phone]  09/10/2020 11:18 AM

## 2020-09-10 NOTE — Progress Notes (Signed)
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  INTERVAL HISTORY: Patient returns to clinic today for repeat laboratory work, further evaluation and continuation of Xgeva.  He was last seen in clinic on 06/14/2020.  In the interim he was seen by his PCP for annual follow-up.  He was treated for a groin rash with Lotrimin lotion.  He receives [place] approximately every 3 months last on 06/14/2020.  Today, he continues to do well and is asymptomatic.  He continues Zytiga and prednisone without side effects.  Reports staying active and denies any intolerable side effects.  States he mowed his grass yesterday.   REVIEW OF SYSTEMS:   Review of Systems  Constitutional: Positive for malaise/fatigue. Negative for chills, fever and weight loss.  HENT: Negative for congestion, ear pain and tinnitus.   Eyes: Negative.  Negative for blurred vision and double vision.  Respiratory: Negative.  Negative for cough, sputum production and shortness of breath.   Cardiovascular: Negative.  Negative for chest pain, palpitations and leg swelling.  Gastrointestinal: Negative.  Negative for abdominal pain, constipation, diarrhea, nausea and vomiting.  Genitourinary: Negative for dysuria, frequency and urgency.  Musculoskeletal: Negative for back pain and falls.  Skin: Negative.  Negative for rash.  Neurological: Negative.  Negative for weakness and headaches.  Endo/Heme/Allergies: Negative.  Does not bruise/bleed easily.  Psychiatric/Behavioral: Negative.  Negative for depression. The patient is not  nervous/anxious and does not have insomnia.     As per HPI. Otherwise, a complete review of systems is negative.  PAST MEDICAL HISTORY: Past Medical History:  Diagnosis Date  . 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 CAD (coronary artery disease), native coronary artery 2014   By CT lung (04/2013)   . Cataract   . HLD (hyperlipidemia)   . HTN (hypertension)   . Hyperglycemia   . NAFLD (nonalcoholic fatty liver disease) 08/2011   by abd [place] with increased LFTs  . OSA (obstructive sleep apnea)    did not tolerate CPAP  . Other benign neoplasm of connective and other soft tissue of unspecified site    Neurofibroma of lateral periorbital area  . Peripheral neuropathy   . Personal history of colonic adenomas 08/05/2012  . Pneumonia 01/2013   CAP LUL  . Restless legs syndrome (RLS)   . Sleep apnea    no cpap    PAST SURGICAL HISTORY: Past Surgical History:  Procedure Laterality Date  . COLONOSCOPY  07/2012   5 adenomas (tubular, TV, serrated), rec rpt 5 months [name])  . COLONOSCOPY  02/2013   residual adenomas, cecal AVM, rec rpt 1 yr [name])  . COLONOSCOPY  03/2014   no residual polyp, cecal AVM, rpt 3-4 yrs [name])  . ECTROPION REPAIR Bilateral 09/29/2017   Procedure: REPAIR OF ECTROPION, EXTENSIVE UPPER AND LOWER;  Surgeon: Karle Starch, MD;  Location: [facility];  Service: Ophthalmology;  Laterality: Bilateral;  . ORIF ANKLE FRACTURE  11/29/00   Dr. Tamala Julian  . PTOSIS REPAIR Bilateral 09/29/2017   Procedure: BLEPHAROPTOSIS REPAIR RESECT EX UPPER;  Surgeon: Karle Starch, MD;  Location: [facility];  Service: Ophthalmology;  Laterality: Bilateral;  . RECONSTRUCTION OF EYELID Bilateral 05/31/2018  . SKIN CANCER EXCISION  1997   left anterior neck    FAMILY HISTORY: Family History  Problem Relation Age of Onset  . Cancer Father        liver  . Alcohol abuse Father   . Liver cancer Father   . Cancer Mother         breast  . Breast cancer Mother   . Hypertension Brother        Half-brother  . Coronary artery disease Paternal Aunt        CABG  . Cancer Other        GM; Aunt - breast  . Colon cancer Neg Hx   . Rectal cancer Neg Hx   . Stomach cancer Neg Hx   . Esophageal cancer Neg Hx     ADVANCED DIRECTIVES (Y/N):  N  HEALTH MAINTENANCE: Social History   Tobacco Use  . Smoking status: Former Smoker    Types: Cigarettes, Cigars    Quit date: 04/28/2002    Years since quitting: 18.3  . Smokeless tobacco: Never Used  . Tobacco comment: rare cigar  Vaping Use  . Vaping Use: Never used  Substance Use Topics  . Alcohol use: Not Currently  . Drug use: No     Colonoscopy:  PAP:  Bone density:  Lipid panel:  No Known Allergies  Current Outpatient Medications  Medication Sig Dispense Refill  . abiraterone acetate (ZYTIGA) 250 MG tablet Take 4 tablets (1,000 mg total) by mouth daily. Take on an empty stomach 1 hour before or 2 hours after a meal 120 tablet 5  . amLODipine (NORVASC) 5 MG tablet Take 1 tablet (5 mg total) by mouth daily. 90 tablet 3  . CALCIUM PO Take 1,200 mg by mouth daily.    . cholecalciferol (VITAMIN D) 1000 UNITS tablet Take 2,000 Units by mouth daily.     . clotrimazole (LOTRIMIN) 1 % cream Apply 1 application topically 2 (two) times daily. For 2 weeks or until full resolution 30 g 0  . gabapentin (NEURONTIN) 300 MG capsule Take 1 capsule (300 mg total) by mouth at bedtime. 90 capsule 3  . HYDROcodone-acetaminophen (NORCO/VICODIN) 5-325 MG tablet Take 1 tablet by mouth daily. As needed. 60 tablet 0  . lisinopril (PRINIVIL,ZESTRIL) 5 MG tablet Take 1 tablet (5 mg total) by mouth daily. 90 tablet 3  . Multiple Vitamins-Minerals (CENTRUM SILVER 50+MEN) TABS Take by mouth daily.    . Omega-3 Fatty Acids (FISH OIL) 1000 MG CAPS Take 2 capsules (2,000 mg total) by mouth daily.    . predniSONE (DELTASONE) 5 MG tablet Take 1 tablet (5 mg total) by mouth daily with  breakfast. 30 tablet 5  . temazepam (RESTORIL) 30 MG capsule Take 1 capsule (30 mg total) by mouth at bedtime. 30 capsule 3  . traMADol (ULTRAM) 50 MG tablet Take 1 tablet (50 mg total) by mouth 2 (two) times daily as needed. 30 tablet 3  . traZODone (DESYREL) 50 MG tablet Take 1 tablet (50 mg total) by mouth at bedtime. 90 tablet 3   No current facility-administered medications for this visit.   Facility-Administered Medications Ordered in Other Visits  Medication Dose Route Frequency Provider Last Rate Last Admin  . denosumab (XGEVA) injection 120 mg  120 mg Subcutaneous Once Lloyd Huger, MD        OBJECTIVE: Vitals:   09/10/20 1303  BP: 121/79  Pulse: 64  Resp:  17  Temp: (!) 97.5 F (36.4 C)  SpO2: 97%     Body mass index is 30.76 kg/m.    ECOG FS:0 - Asymptomatic  Physical Exam Constitutional:      Appearance: Normal appearance. He is obese.  HENT:     Head: Normocephalic and atraumatic.  Eyes:     Pupils: Pupils are equal, round, and reactive to light.  Cardiovascular:     Rate and Rhythm: Normal rate and regular rhythm.     Heart sounds: Normal heart sounds. No murmur heard.   Pulmonary:     Effort: Pulmonary effort is normal.     Breath sounds: Normal breath sounds. No wheezing.  Abdominal:     General: Bowel sounds are normal. There is no distension.     Palpations: Abdomen is soft.     Tenderness: There is no abdominal tenderness.  Musculoskeletal:        General: Normal range of motion.     Cervical back: Normal range of motion.  Skin:    General: Skin is warm and dry.     Findings: No rash.  Neurological:     Mental Status: He is alert and oriented to person, place, and time.  Psychiatric:        Judgment: Judgment normal.     LAB RESULTS:  Lab Results  Component Value Date   NA 141 09/10/2020   K 3.2 (L) 09/10/2020   CL 107 09/10/2020   CO2 26 09/10/2020   GLUCOSE 140 (H) 09/10/2020   BUN 19 09/10/2020   CREATININE 1.61 (H)  09/10/2020   CALCIUM 8.8 (L) 09/10/2020   PROT 6.4 (L) 09/10/2020   ALBUMIN 3.6 09/10/2020   AST 17 09/10/2020   ALT 10 09/10/2020   ALKPHOS 53 09/10/2020   BILITOT 0.9 09/10/2020   GFRNONAA 44 (L) 09/10/2020   GFRAA >60 12/13/2019    Lab Results  Component Value Date   WBC 4.6 09/10/2020   NEUTROABS 3.2 09/10/2020   HGB 12.1 (L) 09/10/2020   HCT 34.3 (L) 09/10/2020   MCV 88.6 09/10/2020   PLT 171 09/10/2020     STUDIES: No results found.  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  PLAN:    1. 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 -Prostate biopsy revealed Gleason 8 adenocarcinoma. -His most recent bone scan on January 15, 2017 revealed improvement of his bony metastasis.  -His most recent PSA is stable at 0.78.   -Continue Zytiga and prednisone as prescribed until intolerable side effects for definitive progression of disease.   -Proceed with Xgeva today.  -Calcium level is 8.8.  2.  Hypocalcemia:  -Continue calcium and vitamin D supplementation.  3.  Hypokalemia: -Potassium is 3.2 today. -We will give him 5 days worth of potassium tablets. -Encouraged him to hydrate and to eat potassium rich foods.  Disposition: RTC in 3 months for lab work, MD assessment and Delton See.  Greater than 50% was spent in counseling and coordination of care with this patient including but not limited to discussion of the relevant topics above (See A&P) including, but not limited to diagnosis and management of acute and chronic medical conditions.   Patient expressed understanding and was in agreement with this plan. He also understands that He can call clinic at any time with any questions, concerns, or complaints.  Cancer Staging Primary malignant neoplasm of prostate  metastatic to bone [facility]) Staging form: Prostate, AJCC 8th Edition - Clinical stage from 09/26/2016: Stage IVB (cTX, cN1, cM1c, PSA: 193) - Signed by Lloyd Huger, MD on 09/26/2016 Prostate specific antigen (PSA) range: 20 or greater   Jacquelin Hawking, NP   09/10/2020 1:16 PM

## 2020-10-25 ENCOUNTER — Other Ambulatory Visit: Payer: Self-pay | Admitting: Family Medicine

## 2020-10-26 NOTE — Telephone Encounter (Signed)
Too soon, just filled yesterday.   Name of Medication: Temazepam Name of Pharmacy: [facility] or Written Date and Quantity: 10/25/20, #30 Last Office Visit and Type: 07/13/20, CPE Next Office Visit and Type: 07/16/21, CPE Last Controlled Substance Agreement Date: none Last UDS: none

## 2020-11-21 ENCOUNTER — Encounter: Payer: Self-pay | Admitting: Nurse Practitioner

## 2020-11-21 ENCOUNTER — Ambulatory Visit (INDEPENDENT_AMBULATORY_CARE_PROVIDER_SITE_OTHER): Payer: No Typology Code available for payment source | Admitting: Nurse Practitioner

## 2020-11-21 ENCOUNTER — Other Ambulatory Visit: Payer: Self-pay

## 2020-11-21 ENCOUNTER — Ambulatory Visit
Admission: RE | Admit: 2020-11-21 | Discharge: 2020-11-21 | Disposition: A | Discharge status: Home / Self Care | Payer: No Typology Code available for payment source | Source: Ambulatory Visit | Attending: Nurse Practitioner | Admitting: Nurse Practitioner

## 2020-11-21 ENCOUNTER — Ambulatory Visit
Admission: RE | Admit: 2020-11-21 | Discharge: 2020-11-21 | Disposition: A | Discharge status: Home / Self Care | Payer: No Typology Code available for payment source | Attending: Nurse Practitioner | Admitting: Nurse Practitioner

## 2020-11-21 VITALS — BP 140/88 | HR 80 | Temp 98.6°F | Resp 12 | Ht 65.5 in | Wt 180.5 lb

## 2020-11-21 DIAGNOSIS — R159 Full incontinence of feces: Secondary | ICD-10-CM | POA: Insufficient documentation

## 2020-11-21 DIAGNOSIS — B3742 Candidal balanitis: Secondary | ICD-10-CM | POA: Insufficient documentation

## 2020-11-21 DIAGNOSIS — R109 Unspecified abdominal pain: Secondary | ICD-10-CM | POA: Insufficient documentation

## 2020-11-21 DIAGNOSIS — R10A3 Flank pain, bilateral: Secondary | ICD-10-CM | POA: Insufficient documentation

## 2020-11-21 DIAGNOSIS — R197 Diarrhea, unspecified: Secondary | ICD-10-CM | POA: Diagnosis not present

## 2020-11-21 MED ORDER — NYSTATIN 100000 UNIT/GM EX CREA: 1.0000 "application " | TOPICAL_CREAM | Freq: Two times a day (BID) | CUTANEOUS | 0 refills | Status: AC

## 2020-11-21 MED ORDER — NYSTATIN 100000 UNIT/GM EX CREA: TOPICAL_CREAM | Freq: Two times a day (BID) | CUTANEOUS | Status: DC

## 2020-11-21 NOTE — Assessment & Plan Note (Signed)
Patient is uncircumcised.  Did have white clumping discharge around glans penis.  States it would get red and he would apply lotion without much relief. Start nystatin cream for treatment.

## 2020-11-21 NOTE — Progress Notes (Signed)
Acute Office Visit  Subjective:    Patient ID: Justin Ali, male    DOB: February 07, 1941, 81 y.o.   MRN: [id number]  Chief Complaint  Patient presents with   Abdominal Pain    X 1 week. Both sides of the abdomen towards the sides in the morning mainly when he first wakes up and eases up during the day. No recent trauma. He also has had bowel incontinence today x 2, not able to control. Also, has not urinated often during the day the last week. Today urinated once.    HPI Patient is in today for started Saturday.  States is worse when he gets out of bed.  And then abates as the day goes on.  He denies any new injury, history of kidney stones, or bowel obstruction.  States that previous to this event he has battled with constipation having a bowel movement once every 2 to 3 days and sometimes requires over-the-counter laxative help.  Fecal incontinence: Presents in office with complaints of 2 episodes of fecal incontinence today patient did have the desire to go to the bathroom but states he was unable to make it in time describes the bowel movements as liquid only.  Denies any lower back injury, numbness, tingling, and saddle paresthesias.  Penis problem: Noticed a few days ago per patient states he noticed his glans becoming red and irritated.  No change that he knew of causing symptoms.  States when he noticed his penis becoming red he would use lotion.  Without much relief.  This visit occurred during the SARS-CoV-2 public health emergency.  Safety protocols were in place, including screening questions prior to the visit, additional usage of staff PPE, and extensive cleaning of exam room while observing appropriate contact time as indicated for disinfecting solutions.   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   2 mm - non-bleeding 03/18/2013 colonoscopy    CAD (coronary artery disease), native coronary artery 2014   By CT lung (04/2013)    Cataract     HLD (hyperlipidemia)    HTN (hypertension)    Hyperglycemia    NAFLD (nonalcoholic fatty liver disease) 08/2011   by abd [place] with increased LFTs   OSA (obstructive sleep apnea)    did not tolerate CPAP   Other benign neoplasm of connective and other soft tissue of unspecified site    Neurofibroma of lateral periorbital area   Peripheral neuropathy    Personal history of colonic adenomas 08/05/2012   Pneumonia 01/2013   CAP LUL   Restless legs syndrome (RLS)    Sleep apnea    no cpap    Past Surgical History:  Procedure Laterality Date   COLONOSCOPY  07/2012   5 adenomas (tubular, TV, serrated), rec rpt 5 months [name])   COLONOSCOPY  02/2013   residual adenomas, cecal AVM, rec rpt 1 yr [name])   COLONOSCOPY  03/2014   no residual polyp, cecal AVM, rpt 3-4 yrs [name])   ECTROPION REPAIR Bilateral 09/29/2017   Procedure: REPAIR OF ECTROPION, EXTENSIVE UPPER AND LOWER;  Surgeon: Karle Starch, MD;  Location: [facility];  Service: Ophthalmology;  Laterality: Bilateral;   ORIF ANKLE FRACTURE  11/29/00   Dr. Tamala Julian   PTOSIS REPAIR Bilateral 09/29/2017   Procedure: BLEPHAROPTOSIS REPAIR RESECT EX UPPER;  Surgeon: Karle Starch, MD;  Location: [facility];  Service: Ophthalmology;  Laterality: Bilateral;   RECONSTRUCTION OF EYELID Bilateral 05/31/2018   SKIN  CANCER EXCISION  1997   left anterior neck    Family History  Problem Relation Age of Onset   Cancer Father        liver   Alcohol abuse Father    Liver cancer Father    Cancer Mother        breast   Breast cancer Mother    Hypertension Brother        Half-brother   Coronary artery disease Paternal Aunt        CABG   Cancer Other        GM; Aunt - breast   Colon cancer Neg Hx    Rectal cancer Neg Hx    Stomach cancer Neg Hx    Esophageal cancer Neg Hx     Social History   Socioeconomic History   Marital status: Married    Spouse name: Not on file   Number of children: 2   Years of education:  Not on file   Highest education level: Not on file  Occupational History   Occupation: Truck driver    Comment: [employer]  Tobacco Use   Smoking status: Former    Types: Cigarettes, Cigars    Quit date: 04/28/2002    Years since quitting: 18.5   Smokeless tobacco: Never   Tobacco comments:    rare cigar  Vaping Use   Vaping Use: Never used  Substance and Sexual Activity   Alcohol use: Not Currently   Drug use: No   Sexual activity: Not on file  Other Topics Concern   Not on file  Social History Narrative   Caffeine: occasional coffee   Lives with wife, 1 cat   Some care through the [place] - served 8 years in the [employer]   Occupation: truck driver Adult nurse)   Activity: goes to [employer] 3x/wk   Diet: rare water, lots of sweet tea, some fruits/vegetables   Social Determinants of Radio broadcast assistant Strain: Low Risk    Difficulty of Paying Living Expenses: Not hard at all  Food Insecurity: No Food Insecurity   Worried About Charity fundraiser in the Last Year: Never true   Arboriculturist in the Last Year: Never true  Transportation Needs: No Transportation Needs   Lack of Transportation (Medical): No   Lack of Transportation (Non-Medical): No  Physical Activity: Sufficiently Active   Days of Exercise per Week: 3 days   Minutes of Exercise per Session: 60 min  Stress: No Stress Concern Present   Feeling of Stress : Not at all  Social Connections: Not on file  Intimate Partner Violence: Not At Risk   Fear of Current or Ex-Partner: No   Emotionally Abused: No   Physically Abused: No   Sexually Abused: No    Outpatient Medications Prior to Visit  Medication Sig Dispense Refill   abiraterone acetate (ZYTIGA) 250 MG tablet Take 4 tablets (1,000 mg total) by mouth daily. Take on an empty stomach 1 hour before or 2 hours after a meal 120 tablet 5   amLODipine (NORVASC) 5 MG tablet Take 1 tablet (5 mg total) by mouth daily. 90 tablet 3   CALCIUM PO Take 1,200 mg  by mouth daily.     cholecalciferol (VITAMIN D) 1000 UNITS tablet Take 2,000 Units by mouth daily.      clotrimazole (LOTRIMIN) 1 % cream Apply 1 application topically 2 (two) times daily. For 2 weeks or until full resolution 30 g 0  gabapentin (NEURONTIN) 300 MG capsule Take 1 capsule (300 mg total) by mouth at bedtime. 90 capsule 3   HYDROcodone-acetaminophen (NORCO/VICODIN) 5-325 MG tablet Take 1 tablet by mouth daily. As needed. 60 tablet 0   lisinopril (PRINIVIL,ZESTRIL) 5 MG tablet Take 1 tablet (5 mg total) by mouth daily. 90 tablet 3   Multiple Vitamins-Minerals (CENTRUM SILVER 50+MEN) TABS Take by mouth daily.     Omega-3 Fatty Acids (FISH OIL) 1000 MG CAPS Take 2 capsules (2,000 mg total) by mouth daily.     potassium chloride SA (KLOR-CON) 20 MEQ tablet Take 1 tablet (20 mEq total) by mouth 2 (two) times daily. 10 tablet 0   predniSONE (DELTASONE) 5 MG tablet Take 1 tablet (5 mg total) by mouth daily with breakfast. 30 tablet 5   temazepam (RESTORIL) 30 MG capsule Take 1 capsule (30 mg total) by mouth at bedtime. 30 capsule 3   traMADol (ULTRAM) 50 MG tablet Take 1 tablet (50 mg total) by mouth 2 (two) times daily as needed. 30 tablet 3   traZODone (DESYREL) 50 MG tablet Take 1 tablet (50 mg total) by mouth at bedtime. 90 tablet 3   No facility-administered medications prior to visit.    No Known Allergies  Review of Systems  Constitutional:  Negative for chills and fever.  Respiratory:  Negative for shortness of breath.   Cardiovascular:  Negative for chest pain.  Gastrointestinal:  Positive for abdominal distention, abdominal pain, constipation and diarrhea. Negative for nausea and vomiting.       Fecal incontinence.  Pain on bilateral sides  Genitourinary:  Positive for penile pain. Negative for dysuria and penile swelling.       Described as reddened penis  Neurological:  Negative for weakness and numbness.      Objective:    Physical Exam Vitals and nursing note  reviewed. Exam conducted with a chaperone present [facility], CMA).  Constitutional:      Appearance: He is well-developed.  Cardiovascular:     Rate and Rhythm: Normal rate and regular rhythm.     Heart sounds: Normal heart sounds.  Pulmonary:     Effort: Pulmonary effort is normal.     Breath sounds: Normal breath sounds.  Abdominal:     General: Abdomen is protuberant. Bowel sounds are normal.     Palpations: Abdomen is soft. There is no hepatomegaly or mass.     Tenderness: There is abdominal tenderness in the left lower quadrant.     Comments: Diastasis recti  Genitourinary:    Penis: Uncircumcised.      Rectum: Normal.       Comments: Did get brown residual on rectal exam.  Did not palpate fecal impaction.  Adequate rectal tone. Musculoskeletal:     Right hip: Normal.     Left hip: Normal.     Comments: 5-5 strength bilateral upper and bilateral lower extremities.  Neurological:     Mental Status: He is alert.    BP 140/88   Pulse 80   Temp 98.6 F (37 C)   Resp 12   Ht 5' 5.5" (1.664 m)   Wt 180 lb 8 oz (81.9 kg)   SpO2 97%   BMI 29.58 kg/m  Wt Readings from Last 3 Encounters:  11/21/20 180 lb 8 oz (81.9 kg)  09/10/20 187 lb 11.2 oz (85.1 kg)  07/13/20 190 lb 5 oz (86.3 kg)    Health Maintenance Due  Topic Date Due   Zoster Vaccines- Shingrix (1  of 2) Never done   COVID-19 Vaccine (4 - Booster for Pfizer series) 07/25/2020    There are no preventive care reminders to display for this patient.   Lab Results  Component Value Date   TSH 1.29 07/05/2020   Lab Results  Component Value Date   WBC 4.6 09/10/2020   HGB 12.1 (L) 09/10/2020   HCT 34.3 (L) 09/10/2020   MCV 88.6 09/10/2020   PLT 171 09/10/2020   Lab Results  Component Value Date   NA 141 09/10/2020   K 3.2 (L) 09/10/2020   CO2 26 09/10/2020   GLUCOSE 140 (H) 09/10/2020   BUN 19 09/10/2020   CREATININE 1.61 (H) 09/10/2020   BILITOT 0.9 09/10/2020   ALKPHOS 53 09/10/2020    AST 17 09/10/2020   ALT 10 09/10/2020   PROT 6.4 (L) 09/10/2020   ALBUMIN 3.6 09/10/2020   CALCIUM 8.8 (L) 09/10/2020   ANIONGAP 8 09/10/2020   GFR 67.09 06/22/2018   Lab Results  Component Value Date   CHOL 134 07/05/2020   Lab Results  Component Value Date   HDL 26.80 (L) 07/05/2020   Lab Results  Component Value Date   LDLCALC 68 07/05/2020   Lab Results  Component Value Date   TRIG 192.0 (H) 07/05/2020   Lab Results  Component Value Date   CHOLHDL 5 07/05/2020   No results found for: HGBA1C     Assessment & Plan:   Problem List Items Addressed This Visit       Genitourinary   Candidal balanitis    Patient is uncircumcised.  Did have white clumping discharge around glans penis.  States it would get red and he would apply lotion without much relief. Start nystatin cream for treatment.       Relevant Medications   nystatin cream (MYCOSTATIN)     Other   Incontinence of feces    States 2 episodes today.  Felt stool coming unable to get to the restroom in time.  Have history of constipation and uses over-the-counter laxatives.  Before use of laxatives patient would use bowel movement 2-3 times a week.  No history of bowel obstruction.  No fecal impaction noted on exam.  Will obtain x-ray abdomen to see if he has heavy stool burden, renal calculi, or if they can visualize fecal impaction not palpated on exam.       Relevant Orders   DG Abd 2 Views   Side pain - Primary    Pain described as stabbing worse in the morning and above his bilateral hips/iliac crest.  No known injury or cause currently. No history of kidney stones.  Patient was unable to give [place] urine in office.  Differential could be meralgia paresthetica.  If so atypical presentation.       Diarrhea    States 2 episodes today.  Patient attributes urine decrease to the loss of fluid from his diarrhea. Will check BMP in office to make sure electrolytes are stable and kidney function  stable. Pending lab result may need further work-up.       Relevant Orders   Basic metabolic panel     No orders of the defined types were placed in this encounter.  Follow-up as scheduled.  May follow-up sooner if symptoms fail to improve or worsen.  Romilda Garret, NP

## 2020-11-21 NOTE — Assessment & Plan Note (Signed)
States 2 episodes today.  Patient attributes urine decrease to the loss of fluid from his diarrhea. Will check BMP in office to make sure electrolytes are stable and kidney function stable. Pending lab result may need further work-up.

## 2020-11-21 NOTE — Assessment & Plan Note (Signed)
Pain described as stabbing worse in the morning and above his bilateral hips/iliac crest.  No known injury or cause currently. No history of kidney stones.  Patient was unable to give [place] urine in office.  Differential could be meralgia paresthetica.  If so atypical presentation.

## 2020-11-21 NOTE — Patient Instructions (Signed)
Get xray done either today or tomorrow Let the office know if you dont pee Follow up if symptoms worsen or fail to improve

## 2020-11-21 NOTE — Assessment & Plan Note (Addendum)
States 2 episodes today.  Felt stool coming unable to get to the restroom in time.  Have history of constipation and uses over-the-counter laxatives.  Before use of laxatives patient would use bowel movement 2-3 times a week.  No history of bowel obstruction.  No fecal impaction noted on exam.  Will obtain x-ray abdomen to see if he has heavy stool burden, renal calculi, or if they can visualize fecal impaction not palpated on exam. Signs and symptoms reviewed with patient as to when to seek emergency room care.

## 2020-11-22 LAB — BASIC METABOLIC PANEL
BUN: 15 mg/dL (ref 6–23)
CO2: 26 mEq/L (ref 19–32)
Calcium: 9.5 mg/dL (ref 8.4–10.5)
Chloride: 109 mEq/L (ref 96–112)
Creatinine, Ser: 1.34 mg/dL (ref 0.40–1.50)
GFR: 50.56 mL/min — ABNORMAL LOW (ref >60.00)
Glucose, Bld: 99 mg/dL (ref 70–99)
Potassium: 3.5 mEq/L (ref 3.5–5.1)
Sodium: 145 mEq/L (ref 135–145)

## 2020-11-26 ENCOUNTER — Other Ambulatory Visit: Payer: Self-pay | Admitting: Family Medicine

## 2020-11-26 NOTE — Telephone Encounter (Signed)
ERx 

## 2020-12-18 ENCOUNTER — Inpatient Hospital Stay: Payer: No Typology Code available for payment source | Attending: Oncology

## 2020-12-18 ENCOUNTER — Encounter: Payer: Self-pay | Admitting: Oncology

## 2020-12-18 ENCOUNTER — Inpatient Hospital Stay (HOSPITAL_BASED_OUTPATIENT_CLINIC_OR_DEPARTMENT_OTHER): Payer: No Typology Code available for payment source | Admitting: Oncology

## 2020-12-18 ENCOUNTER — Other Ambulatory Visit: Payer: Self-pay | Admitting: Family Medicine

## 2020-12-18 ENCOUNTER — Inpatient Hospital Stay: Payer: No Typology Code available for payment source | Admitting: Pharmacist

## 2020-12-18 ENCOUNTER — Inpatient Hospital Stay: Payer: No Typology Code available for payment source

## 2020-12-18 VITALS — BP 117/74 | HR 71 | Temp 98.9°F | Resp 18 | Wt 217.0 lb

## 2020-12-18 DIAGNOSIS — C61 Malignant neoplasm of prostate: Secondary | ICD-10-CM | POA: Diagnosis present

## 2020-12-18 DIAGNOSIS — C7951 Secondary malignant neoplasm of bone: Secondary | ICD-10-CM | POA: Diagnosis not present

## 2020-12-18 DIAGNOSIS — Z79899 Other long term (current) drug therapy: Secondary | ICD-10-CM | POA: Diagnosis not present

## 2020-12-18 DIAGNOSIS — D649 Anemia, unspecified: Secondary | ICD-10-CM

## 2020-12-18 LAB — CBC WITH DIFFERENTIAL/PLATELET
Abs Immature Granulocytes: 0.03 10*3/uL (ref 0.00–0.07)
Basophils Absolute: 0 10*3/uL (ref 0.0–0.1)
Basophils Relative: 0 %
Eosinophils Absolute: 0.1 10*3/uL (ref 0.0–0.5)
Eosinophils Relative: 2 %
HCT: 34 % — ABNORMAL LOW (ref 39.0–52.0)
Hemoglobin: 11.6 g/dL — ABNORMAL LOW (ref 13.0–17.0)
Immature Granulocytes: 1 %
Lymphocytes Relative: 17 %
Lymphs Abs: 0.9 10*3/uL (ref 0.7–4.0)
MCH: 31.2 pg (ref 26.0–34.0)
MCHC: 34.1 g/dL (ref 30.0–36.0)
MCV: 91.4 fL (ref 80.0–100.0)
Monocytes Absolute: 0.2 10*3/uL (ref 0.1–1.0)
Monocytes Relative: 5 %
Neutro Abs: 4 10*3/uL (ref 1.7–7.7)
Neutrophils Relative %: 75 %
Platelets: 150 10*3/uL (ref 150–400)
RBC: 3.72 MIL/uL — ABNORMAL LOW (ref 4.22–5.81)
RDW: 12.9 % (ref 11.5–15.5)
WBC: 5.2 10*3/uL (ref 4.0–10.5)
nRBC: 0 % (ref 0.0–0.2)

## 2020-12-18 LAB — COMPREHENSIVE METABOLIC PANEL
ALT: 8 U/L (ref 0–44)
AST: 16 U/L (ref 15–41)
Albumin: 3.6 g/dL (ref 3.5–5.0)
Alkaline Phosphatase: 53 U/L (ref 38–126)
Anion gap: 5 (ref 5–15)
BUN: 20 mg/dL (ref 8–23)
CO2: 27 mmol/L (ref 22–32)
Calcium: 8.7 mg/dL — ABNORMAL LOW (ref 8.9–10.3)
Chloride: 106 mmol/L (ref 98–111)
Creatinine, Ser: 1.84 mg/dL — ABNORMAL HIGH (ref 0.61–1.24)
GFR, Estimated: 37 mL/min — ABNORMAL LOW (ref >60)
Glucose, Bld: 132 mg/dL — ABNORMAL HIGH (ref 70–99)
Potassium: 4.2 mmol/L (ref 3.5–5.1)
Sodium: 138 mmol/L (ref 135–145)
Total Bilirubin: 0.5 mg/dL (ref 0.3–1.2)
Total Protein: 6.2 g/dL — ABNORMAL LOW (ref 6.5–8.1)

## 2020-12-18 LAB — PSA: Prostatic Specific Antigen: 0.82 ng/mL (ref 0.00–4.00)

## 2020-12-18 MED ORDER — TRAMADOL HCL 50 MG PO TABS: 50.0000 mg | ORAL_TABLET | Freq: Two times a day (BID) | ORAL | 3 refills | Status: DC | PRN

## 2020-12-18 MED ORDER — HYDROCODONE-ACETAMINOPHEN 5-325 MG PO TABS: 1.0000 | ORAL_TABLET | Freq: Every day | ORAL | 0 refills | Status: DC

## 2020-12-18 MED ORDER — DENOSUMAB 120 MG/1.7ML ~~LOC~~ SOLN
120.0000 mg | Freq: Once | SUBCUTANEOUS | Status: AC
  Administered 2020-12-18: 120 mg via SUBCUTANEOUS
  Filled 2020-12-18: qty 1.7

## 2020-12-18 NOTE — Progress Notes (Signed)
Pt reports "has pain every morning in abdomen but it gets better after I'm up awhile". "Sometimes I take a pain pill but only when it's really bad and I need them".

## 2020-12-18 NOTE — Telephone Encounter (Signed)
ERx 

## 2020-12-18 NOTE — Telephone Encounter (Signed)
Last filled 11-26-20 #30 Last OV Acute 11-21-20 Next OV 07-16-21 [facility]

## 2020-12-18 NOTE — Progress Notes (Signed)
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  INTERVAL HISTORY: Patient returns to clinic today for repeat laboratory work, further evaluation and continuation of Xgeva.  He was last seen in clinic on 09/10/2020.  He was evaluated on 11/21/2020 for fecal incontinence and bilateral flank pain. Work-up included abdominal x-rays which showed progression of sclerotic bone metastasis related to metastatic prostate cancer in the bony pelvis and proximal femurs since 2018.  There also was diffuse involvement of the sacrum and vertebral bodies.  No visible pathological fracture.  Per patient, there was no intervention and pain resolved on its own.  He continues Zytiga and prednisone without any issue.  States 2 to 3 weeks prior to his next Xgeva injection he has noticed increased bone pain first thing in the morning that changes in location.  States after he gets up and is moving around aches and pains tend to go away on their own.  Denies any additional fecal incontinence.  REVIEW OF SYSTEMS:   Review of Systems  Constitutional:  Positive for malaise/fatigue. Negative for chills, fever and weight loss.  HENT:  Negative for congestion, ear pain and tinnitus.   Eyes: Negative.  Negative for blurred vision and double vision.  Respiratory: Negative.  Negative for cough, sputum production and shortness of breath.   Cardiovascular: Negative.  Negative for chest pain, palpitations and leg swelling.  Gastrointestinal:  Positive for abdominal pain. Negative for constipation, diarrhea, nausea and vomiting.   Genitourinary:  Positive for flank pain. Negative for dysuria, frequency and urgency.  Musculoskeletal:  Positive for back pain. Negative for falls.       Shoulder pain-left  Skin: Negative.  Negative for rash.  Neurological:  Positive for weakness. Negative for headaches.  Endo/Heme/Allergies: Negative.  Does not bruise/bleed easily.  Psychiatric/Behavioral: Negative.  Negative for depression. The patient is not nervous/anxious and does not have insomnia.    As per HPI. Otherwise, a complete review of systems is negative.  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   2 mm - non-bleeding 03/18/2013 colonoscopy    CAD (coronary artery disease), native coronary artery 2014   By CT lung (04/2013)    Cataract    HLD (hyperlipidemia)    HTN (hypertension)    Hyperglycemia    NAFLD (nonalcoholic fatty liver disease) 08/2011   by abd [place] with increased LFTs   OSA (obstructive sleep apnea)    did not tolerate CPAP   Other benign neoplasm of connective and other soft tissue of unspecified site    Neurofibroma of lateral periorbital area   Peripheral neuropathy    Personal history of colonic adenomas 08/05/2012   Pneumonia 01/2013   CAP LUL   Restless legs syndrome (RLS)    Sleep apnea    no cpap    PAST SURGICAL HISTORY: Past Surgical History:  Procedure Laterality Date   COLONOSCOPY  07/2012   5 adenomas (tubular, TV, serrated), rec rpt 5 months [name])   COLONOSCOPY  02/2013   residual adenomas, cecal AVM, rec rpt 1 yr [name])   COLONOSCOPY  03/2014   no residual polyp, cecal AVM, rpt 3-4  yrs [name])   ECTROPION REPAIR Bilateral 09/29/2017   Procedure: REPAIR OF ECTROPION, EXTENSIVE UPPER AND LOWER;  Surgeon: Karle Starch, MD;  Location: [facility];  Service: Ophthalmology;  Laterality: Bilateral;   ORIF ANKLE FRACTURE  11/29/00   Dr. Tamala Julian   PTOSIS REPAIR Bilateral 09/29/2017   Procedure: BLEPHAROPTOSIS REPAIR  RESECT EX UPPER;  Surgeon: Karle Starch, MD;  Location: [facility];  Service: Ophthalmology;  Laterality: Bilateral;   RECONSTRUCTION OF EYELID Bilateral 05/31/2018   SKIN CANCER EXCISION  1997   left anterior neck    FAMILY HISTORY: Family History  Problem Relation Age of Onset   Cancer Father        liver   Alcohol abuse Father    Liver cancer Father    Cancer Mother        breast   Breast cancer Mother    Hypertension Brother        Half-brother   Coronary artery disease Paternal Aunt        CABG   Cancer Other        GM; Aunt - breast   Colon cancer Neg Hx    Rectal cancer Neg Hx    Stomach cancer Neg Hx    Esophageal cancer Neg Hx     ADVANCED DIRECTIVES (Y/N):  N  HEALTH MAINTENANCE: Social History   Tobacco Use   Smoking status: Former    Types: Cigarettes, Cigars    Quit date: 04/28/2002    Years since quitting: 18.6   Smokeless tobacco: Never   Tobacco comments:    rare cigar  Vaping Use   Vaping Use: Never used  Substance Use Topics   Alcohol use: Not Currently   Drug use: No     Colonoscopy:  PAP:  Bone density:  Lipid panel:  No Known Allergies  Current Outpatient Medications  Medication Sig Dispense Refill   amLODipine (NORVASC) 5 MG tablet Take 1 tablet (5 mg total) by mouth daily. 90 tablet 3   CALCIUM PO Take 1,200 mg by mouth daily.     cholecalciferol (VITAMIN D) 1000 UNITS tablet Take 2,000 Units by mouth daily.      clotrimazole (LOTRIMIN) 1 % cream Apply 1 application topically 2 (two) times daily. For 2 weeks or until full resolution 30 g 0   gabapentin (NEURONTIN) 300 MG capsule Take 1 capsule (300 mg total) by mouth at bedtime. 90 capsule 3   HYDROcodone-acetaminophen (NORCO/VICODIN) 5-325 MG tablet Take 1 tablet by mouth daily. As needed. 60 tablet 0   lisinopril (PRINIVIL,ZESTRIL) 5 MG tablet Take 1 tablet (5 mg total) by mouth daily. 90 tablet 3   Multiple Vitamins-Minerals (CENTRUM SILVER 50+MEN) TABS Take by mouth  daily.     Omega-3 Fatty Acids (FISH OIL) 1000 MG CAPS Take 2 capsules (2,000 mg total) by mouth daily.     potassium chloride SA (KLOR-CON) 20 MEQ tablet Take 1 tablet (20 mEq total) by mouth 2 (two) times daily. (Patient taking differently: Take 20 mEq by mouth 2 (two) times daily. Pt reports only takes once a day) 10 tablet 0   predniSONE (DELTASONE) 5 MG tablet Take 1 tablet (5 mg total) by mouth daily with breakfast. 30 tablet 5   temazepam (RESTORIL) 30 MG capsule Take 1 capsule (30 mg total) by mouth at bedtime. 30 capsule 0   traMADol (ULTRAM) 50 MG tablet Take 1 tablet (50 mg total) by mouth 2 (two) times daily as needed. 30 tablet  3   traZODone (DESYREL) 50 MG tablet Take 1 tablet (50 mg total) by mouth at bedtime. 90 tablet 3   abiraterone acetate (ZYTIGA) 250 MG tablet Take 4 tablets (1,000 mg total) by mouth daily. Take on an empty stomach 1 hour before or 2 hours after a meal 120 tablet 5   No current facility-administered medications for this visit.    OBJECTIVE: There were no vitals filed for this visit.    There is no height or weight on file to calculate BMI.    ECOG FS:0 - Asymptomatic  Physical Exam Constitutional:      Appearance: Normal appearance.  HENT:     Head: Normocephalic and atraumatic.  Eyes:     Pupils: Pupils are equal, round, and reactive to light.  Cardiovascular:     Rate and Rhythm: Normal rate and regular rhythm.     Heart sounds: Normal heart sounds. No murmur heard. Pulmonary:     Effort: Pulmonary effort is normal.     Breath sounds: Normal breath sounds. No wheezing.  Abdominal:     General: Bowel sounds are normal. There is no distension.     Palpations: Abdomen is soft.     Tenderness: There is no abdominal tenderness.  Musculoskeletal:        General: Normal range of motion.     Cervical back: Normal range of motion.  Skin:    General: Skin is warm and dry.     Findings: No rash.  Neurological:     Mental Status: He is alert and  oriented to person, place, and time.  Psychiatric:        Judgment: Judgment normal.    LAB RESULTS:  Lab Results  Component Value Date   NA 138 12/18/2020   K 4.2 12/18/2020   CL 106 12/18/2020   CO2 27 12/18/2020   GLUCOSE 132 (H) 12/18/2020   BUN 20 12/18/2020   CREATININE 1.84 (H) 12/18/2020   CALCIUM 8.7 (L) 12/18/2020   PROT 6.2 (L) 12/18/2020   ALBUMIN 3.6 12/18/2020   AST 16 12/18/2020   ALT 8 12/18/2020   ALKPHOS 53 12/18/2020   BILITOT 0.5 12/18/2020   GFRNONAA 37 (L) 12/18/2020   GFRAA >60 12/13/2019    Lab Results  Component Value Date   WBC 5.2 12/18/2020   NEUTROABS 4.0 12/18/2020   HGB 11.6 (L) 12/18/2020   HCT 34.0 (L) 12/18/2020   MCV 91.4 12/18/2020   PLT 150 12/18/2020     STUDIES: DG Abd 2 Views  Result Date: 11/24/2020 CLINICAL DATA:  Bilateral flank pain and fecal incontinence. EXAM: ABDOMEN - 2 VIEW COMPARISON:  Pelvic film on 01/02/2017 FINDINGS: No evidence of bowel obstruction or ileus. Diffuse sclerotic bone metastases are seen throughout all visualized vertebral bodies, throughout the bony pelvis, involving the sacrum and bilateral proximal femurs. Findings are consistent with known metastatic prostate carcinoma. Degree of involvement of the bony pelvis and proximal femurs is more prominent compared to the prior pelvic x-ray in 2018. No visible pathologic fractures. IMPRESSION: Progression of sclerotic bone metastases related to metastatic prostate cancer in the bony pelvis and proximal femurs since 2018. There also is diffuse involvement of the sacrum and vertebral bodies. No visible pathologic fracture. Electronically Signed   By: Aletta Edouard M.D.   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  PLAN:    1. Stage IV prostate cancer with bulky abdominal lymphadenopathy  and widespread bony metastasis: Patient has been stable on Xgeva, Zytiga and  prednisone for several years.  CT scan and bone scan on 09/19/2016 revealed widespread metastatic disease.  Prostate biopsy revealed Gleason 8 adenocarcinoma.  Repeat CT scan from September 2018 revealed improvement of his bony metastasis.  His PSA has been stable for quite some time now.  Labs from 12/18/2020 show a calcium of 8.7.  Proceed with Xgeva.  PSA is pending.  Incidental abdominal imaging for flank pain a few weeks ago did show worsening bony metastasis.  Given his PSA has been normal, will repeat bone scan in the next 1 to 2 weeks for further evaluation.  Patient in agreement.  2.  Hypocalcemia:  Continue calcium and vitamin D supplementation.  3.  CKD: Patient's creatinine has intermittently been elevated and today is 1.84.  Recommend he hydrate.  4.  Anemia: Likely secondary to Zytiga.  It is very mild.  Hemoglobin is 11.6 today.  Given it is slightly trending down, will add on iron panel, ferritin and B12 at his next visit.  Labs from 07/05/2020 showed ferritin of 40.1 and iron saturation of 26%.  5.  Bony pain: This appears to change locations throughout the day.  We will repeat bone scan as above.  He is asking for a refill on his pain medication.  Refill sent.  Disposition: RTC in 3 months for lab work (CBC, CMP, PSA, ferritin, iron panel and B12), MD assessment and Xgeva.  Greater than 50% was spent in counseling and coordination of care with this patient including but not limited to discussion of the relevant topics above (See A&P) including, but not limited to diagnosis and management of acute and chronic medical conditions.   Patient expressed understanding and was in agreement with this plan. He also understands that He can call clinic at any time with any questions, concerns, or complaints.   Cancer Staging Primary malignant 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 [facility]) Staging form: Prostate, AJCC 8th Edition - Clinical stage from 09/26/2016: Stage IVB (cTX, cN1, cM1c,  PSA: 193) - Signed by Lloyd Huger, MD on 09/26/2016 Prostate specific antigen (PSA) range: 20 or greater   Jacquelin Hawking, NP   12/18/2020 1:24 PM

## 2020-12-18 NOTE — Progress Notes (Signed)
[place]  [phone] [phone]  Patient Care Team: Ria Bush, MD as PCP - General (Family Medicine) Birder Robson, MD as Referring Physician (Ophthalmology) Lloyd Huger, MD as Consulting Physician (Oncology)   Name of the patient: Justin Ali  [id number]  1941/02/06   Date of visit: 12/18/20  HPI: Patient is a 80 y.o. male with metastatic prostate cancer. Currently treated with Zytiga (abiraterone) and prednisone  Reason for Consult: Oral chemotherapy follow-up for abiraterone therapy.   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   2 mm - non-bleeding 03/18/2013 colonoscopy    CAD (coronary artery disease), native coronary artery 2014   By CT lung (04/2013)    Cataract    HLD (hyperlipidemia)    HTN (hypertension)    Hyperglycemia    NAFLD (nonalcoholic fatty liver disease) 08/2011   by abd [place] with increased LFTs   OSA (obstructive sleep apnea)    did not tolerate CPAP   Other benign neoplasm of connective and other soft tissue of unspecified site    Neurofibroma of lateral periorbital area   Peripheral neuropathy    Personal history of colonic adenomas 08/05/2012   Pneumonia 01/2013   CAP LUL   Restless legs syndrome (RLS)    Sleep apnea    no cpap    HEMATOLOGY/ONCOLOGY HISTORY:  Oncology History   No history exists.    ALLERGIES:  has No Known Allergies.  MEDICATIONS:  Current Outpatient Medications  Medication Sig Dispense Refill   abiraterone acetate (ZYTIGA) 250 MG tablet Take 4 tablets (1,000 mg total) by mouth daily. Take on an empty stomach 1 hour before or 2 hours after a meal 120 tablet 5   amLODipine (NORVASC) 5 MG tablet Take 1 tablet (5 mg total) by mouth daily. 90 tablet 3   CALCIUM PO Take 1,200 mg by mouth daily.     cholecalciferol (VITAMIN D) 1000 UNITS tablet Take 2,000 Units by mouth daily.       clotrimazole (LOTRIMIN) 1 % cream Apply 1 application topically 2 (two) times daily. For 2 weeks or until full resolution 30 g 0   gabapentin (NEURONTIN) 300 MG capsule Take 1 capsule (300 mg total) by mouth at bedtime. 90 capsule 3   HYDROcodone-acetaminophen (NORCO/VICODIN) 5-325 MG tablet Take 1 tablet by mouth daily. As needed. 60 tablet 0   lisinopril (PRINIVIL,ZESTRIL) 5 MG tablet Take 1 tablet (5 mg total) by mouth daily. 90 tablet 3   Multiple Vitamins-Minerals (CENTRUM SILVER 50+MEN) TABS Take by mouth daily.     Omega-3 Fatty Acids (FISH OIL) 1000 MG CAPS Take 2 capsules (2,000 mg total) by mouth daily.     potassium chloride SA (KLOR-CON) 20 MEQ tablet Take 1 tablet (20 mEq total) by mouth 2 (two) times daily. (Patient taking differently: Take 20 mEq by mouth 2 (two) times daily. Pt reports only takes once a day) 10 tablet 0   predniSONE (DELTASONE) 5 MG tablet Take 1 tablet (5 mg total) by mouth daily with breakfast. 30 tablet 5   temazepam (RESTORIL) 30 MG capsule Take 1 capsule (30 mg total) by mouth at bedtime. 30 capsule 0   traMADol (ULTRAM) 50 MG tablet Take 1 tablet (50 mg total) by mouth 2 (two) times daily as needed. 30 tablet 3   traZODone (DESYREL) 50 MG tablet Take 1 tablet (50 mg total) by mouth at bedtime. 90 tablet 3  No current facility-administered medications for this visit.    VITAL SIGNS: There were no vitals taken for this visit. There were no vitals filed for this visit.  Estimated body mass index is 35.56 kg/m as calculated from the following:   Height as of 11/21/20: 5' 5.5" (1.664 m).   Weight as of an earlier encounter on 12/18/20: 98.4 kg (217 lb).  LABS: CBC:    Component Value Date/Time   WBC 5.2 12/18/2020 1255   HGB 11.6 (L) 12/18/2020 1255   HGB 12.3 (L) 02/16/2013 0523   HCT 34.0 (L) 12/18/2020 1255   HCT 35.6 (L) 02/16/2013 0523   PLT 150 12/18/2020 1255   PLT 166 02/16/2013 0523   MCV 91.4 12/18/2020 1255   MCV 90 02/16/2013 0523    NEUTROABS 4.0 12/18/2020 1255   NEUTROABS 6.8 (H) 02/16/2013 0523   LYMPHSABS 0.9 12/18/2020 1255   LYMPHSABS 1.1 02/16/2013 0523   MONOABS 0.2 12/18/2020 1255   MONOABS 0.8 02/16/2013 0523   EOSABS 0.1 12/18/2020 1255   EOSABS 0.1 02/16/2013 0523   BASOSABS 0.0 12/18/2020 1255   BASOSABS 0.0 02/16/2013 0523   Comprehensive Metabolic Panel:    Component Value Date/Time   NA 138 12/18/2020 1255   NA 142 02/16/2013 0523   K 4.2 12/18/2020 1255   K 4.0 02/16/2013 0523   CL 106 12/18/2020 1255   CL 111 (H) 02/16/2013 0523   CO2 27 12/18/2020 1255   CO2 24 02/16/2013 0523   BUN 20 12/18/2020 1255   BUN 14 02/16/2013 0523   CREATININE 1.84 (H) 12/18/2020 1255   CREATININE 1.60 (H) 02/16/2013 0523   GLUCOSE 132 (H) 12/18/2020 1255   GLUCOSE 91 02/16/2013 0523   CALCIUM 8.7 (L) 12/18/2020 1255   CALCIUM 8.5 02/16/2013 0523   AST 16 12/18/2020 1255   AST 12 (L) 02/14/2013 1857   ALT 8 12/18/2020 1255   ALT 19 02/14/2013 1857   ALKPHOS 53 12/18/2020 1255   ALKPHOS 133 02/14/2013 1857   BILITOT 0.5 12/18/2020 1255   BILITOT 1.0 02/14/2013 1857   PROT 6.2 (L) 12/18/2020 1255   PROT 7.0 02/14/2013 1857   ALBUMIN 3.6 12/18/2020 1255   ALBUMIN 3.3 (L) 02/14/2013 1857     Present during today's visit: patient only  Assessment and Plan: Continue abiraterone and prednisone. Recent xray mentioned "progression of sclerotic bone metastases ". Patient will be scheduled for repeat bone scan. PSA stable.  BP well controlled and potassium wnl   Oral Chemotherapy Side Effect/Intolerance:  No reported side effects. He is able to keep up with his normal daily activities.   Other: Continued to encouraged Justin Ali to wear sunscreen while outside  Oral Chemotherapy Adherence: reported no missed doses so far this month No patient barriers to medication adherence identified.   New medications: None reported  Medication Access Issues: Fills abiraterone and prednisone  sent to [facility]  Patient expressed understanding and was in agreement with this plan. He also understands that He can call clinic at any time with any questions, concerns, or complaints.   Thank you for allowing me to participate in the care of this very pleasant patient.   Time Total: 15 mins  Visit consisted of counseling and education on dealing with issues of symptom management in the setting of serious and potentially life-threatening illness.Greater than 50%  of this time was spent counseling and coordinating care related to the above assessment and plan.  Signed by: Darl Pikes, PharmD, BCPS,  BCOP, CPP Hematology/Oncology Clinical Pharmacist Practitioner [facility] [phone]  12/18/2020 5:56 PM

## 2020-12-19 MED ORDER — PREDNISONE 5 MG PO TABS: 5.0000 mg | ORAL_TABLET | Freq: Every day | ORAL | 5 refills | Status: DC

## 2020-12-19 MED ORDER — ABIRATERONE ACETATE 250 MG PO TABS: 1000.0000 mg | ORAL_TABLET | Freq: Every day | ORAL | 5 refills | Status: DC

## 2021-01-03 ENCOUNTER — Other Ambulatory Visit: Payer: Self-pay

## 2021-01-03 ENCOUNTER — Ambulatory Visit
Admission: RE | Admit: 2021-01-03 | Discharge: 2021-01-03 | Disposition: A | Discharge status: Home / Self Care | Payer: No Typology Code available for payment source | Source: Ambulatory Visit | Attending: Oncology | Admitting: Oncology

## 2021-01-03 ENCOUNTER — Encounter
Admission: RE | Admit: 2021-01-03 | Discharge: 2021-01-03 | Disposition: A | Discharge status: Home / Self Care | Payer: No Typology Code available for payment source | Source: Ambulatory Visit | Attending: Oncology | Admitting: Oncology

## 2021-01-03 DIAGNOSIS — C7951 Secondary malignant neoplasm of bone: Secondary | ICD-10-CM | POA: Diagnosis present

## 2021-01-03 DIAGNOSIS — C61 Malignant neoplasm of prostate: Secondary | ICD-10-CM | POA: Diagnosis not present

## 2021-01-03 MED ORDER — TECHNETIUM TC 99M MEDRONATE IV KIT
20.0000 | PACK | Freq: Once | INTRAVENOUS | Status: AC | PRN
  Administered 2021-01-03: 20.48 via INTRAVENOUS

## 2021-01-09 ENCOUNTER — Other Ambulatory Visit: Payer: Self-pay | Admitting: Oncology

## 2021-01-09 ENCOUNTER — Telehealth: Payer: Self-pay | Admitting: Oncology

## 2021-01-09 NOTE — Telephone Encounter (Signed)
Re- Bone Scan   Bone scan shows improvement of bone lesions from his prostate cancer. Bone pain appears to be d/t degenerative changes.   Spoke with patient and he will speak with PCP about seeing ortho md.   Faythe Casa, NP 01/09/2021 9:36 AM

## 2021-01-09 NOTE — Progress Notes (Signed)
Patient would like a referral to ortho for his joint pain in shoulders and lower back.   Referral sent.   Faythe Casa, NP 01/09/2021 4:13 PM

## 2021-01-22 ENCOUNTER — Other Ambulatory Visit: Payer: Self-pay | Admitting: Family Medicine

## 2021-01-22 NOTE — Telephone Encounter (Signed)
Name of Medication: Temazepam Name of Pharmacy: [place] or Written Date and Quantity: 12/18/20, #30 Last Office Visit and Type: 07/13/20, AWV prt 2 Next Office Visit and Type: 07/16/21, AWV prt 2 Last Controlled Substance Agreement Date: none Last UDS: none

## 2021-01-23 NOTE — Telephone Encounter (Signed)
ERx 

## 2021-03-25 NOTE — Progress Notes (Signed)
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  INTERVAL HISTORY: Patient returns to clinic today for routine 84-month evaluation and continuation of treatment.  He continues to have chronic back pain, but otherwise feels well.  He is tolerating his treatments with Zytiga, prednisone, and Xgeva without significant side effects.  He has no neurologic complaints.  He has a good appetite and denies weight loss.  He denies any recent fevers or illnesses.  He denies any chest pain, shortness of breath, cough, or hemoptysis.  He denies any nausea, vomiting, constipation, or diarrhea. He has no urinary complaints.  Patient offers no further specific complaints today.  REVIEW OF SYSTEMS:   Review of Systems  Constitutional: Negative.  Negative for fever, malaise/fatigue and weight loss.  Eyes: Negative.  Negative for blurred vision.  Respiratory: Negative.  Negative for cough and shortness of breath.   Cardiovascular: Negative.  Negative for chest pain and leg swelling.  Gastrointestinal: Negative.  Negative for abdominal pain.  Genitourinary: Negative.  Negative for frequency and urgency.  Musculoskeletal:  Positive for back pain. Negative for joint pain.  Skin: Negative.  Negative for rash.  Neurological: Negative.  Negative for dizziness, sensory change, focal weakness and weakness.  Psychiatric/Behavioral: Negative.  The patient is not nervous/anxious and does not have insomnia.    As per HPI. Otherwise, a complete review of systems is negative.  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   2 mm - non-bleeding 03/18/2013 colonoscopy    CAD (coronary artery disease), native coronary artery 2014   By CT lung (04/2013)    Cataract    HLD (hyperlipidemia)    HTN (hypertension)    Hyperglycemia    NAFLD (nonalcoholic fatty liver disease) 08/2011   by abd [place] with increased LFTs   OSA (obstructive sleep apnea)    did not tolerate CPAP   Other benign neoplasm of connective and other soft tissue of unspecified site    Neurofibroma of lateral periorbital area   Peripheral neuropathy    Personal history of colonic adenomas 08/05/2012   Pneumonia 01/2013   CAP LUL   Restless legs syndrome (RLS)    Sleep apnea    no cpap    PAST SURGICAL HISTORY: Past Surgical History:  Procedure Laterality Date   COLONOSCOPY  07/2012   5 adenomas (tubular, TV, serrated), rec rpt 5 months [name])   COLONOSCOPY  02/2013   residual adenomas, cecal AVM, rec rpt 1 yr [name])   COLONOSCOPY  03/2014   no residual polyp, cecal AVM, rpt 3-4 yrs [name])   ECTROPION REPAIR Bilateral 09/29/2017   Procedure: REPAIR OF ECTROPION, EXTENSIVE UPPER AND LOWER;  Surgeon: Karle Starch, MD;  Location: [facility];  Service: Ophthalmology;  Laterality: Bilateral;   ORIF ANKLE FRACTURE  11/29/00   Dr. Tamala Julian   PTOSIS REPAIR Bilateral 09/29/2017   Procedure: BLEPHAROPTOSIS REPAIR RESECT EX UPPER;  Surgeon: Karle Starch, MD;  Location: [facility];  Service: Ophthalmology;  Laterality: Bilateral;   RECONSTRUCTION OF EYELID Bilateral 05/31/2018   SKIN CANCER EXCISION  1997   left anterior neck  FAMILY HISTORY: Family History  Problem Relation Age of Onset   Cancer Father        liver   Alcohol abuse Father    Liver cancer Father    Cancer Mother        breast   Breast cancer Mother    Hypertension Brother        Half-brother   Coronary artery disease Paternal Aunt        CABG   Cancer Other        GM; Aunt - breast   Colon  cancer Neg Hx    Rectal cancer Neg Hx    Stomach cancer Neg Hx    Esophageal cancer Neg Hx     ADVANCED DIRECTIVES (Y/N):  N  HEALTH MAINTENANCE: Social History   Tobacco Use   Smoking status: Former    Types: Cigarettes, Cigars    Quit date: 04/28/2002    Years since quitting: 18.9   Smokeless tobacco: Never   Tobacco comments:    rare cigar  Vaping Use   Vaping Use: Never used  Substance Use Topics   Alcohol use: Not Currently   Drug use: No     Colonoscopy:  PAP:  Bone density:  Lipid panel:  No Known Allergies  Current Outpatient Medications  Medication Sig Dispense Refill   abiraterone acetate (ZYTIGA) 250 MG tablet Take 4 tablets (1,000 mg total) by mouth daily. Take on an empty stomach 1 hour before or 2 hours after a meal 120 tablet 5   amLODipine (NORVASC) 5 MG tablet Take 1 tablet (5 mg total) by mouth daily. 90 tablet 3   CALCIUM PO Take 1,200 mg by mouth daily.     cholecalciferol (VITAMIN D) 1000 UNITS tablet Take 2,000 Units by mouth daily.      clotrimazole (LOTRIMIN) 1 % cream Apply 1 application topically 2 (two) times daily. For 2 weeks or until full resolution 30 g 0   gabapentin (NEURONTIN) 300 MG capsule Take 1 capsule (300 mg total) by mouth at bedtime. 90 capsule 3   lisinopril (PRINIVIL,ZESTRIL) 5 MG tablet Take 1 tablet (5 mg total) by mouth daily. 90 tablet 3   Multiple Vitamins-Minerals (CENTRUM SILVER 50+MEN) TABS Take by mouth daily.     Omega-3 Fatty Acids (FISH OIL) 1000 MG CAPS Take 2 capsules (2,000 mg total) by mouth daily.     potassium chloride SA (KLOR-CON) 20 MEQ tablet Take 1 tablet (20 mEq total) by mouth 2 (two) times daily. (Patient taking differently: Take 20 mEq by mouth 2 (two) times daily. Pt reports only takes once a day) 10 tablet 0   predniSONE (DELTASONE) 5 MG tablet Take 1 tablet (5 mg total) by mouth daily with breakfast. 30 tablet 5   temazepam (RESTORIL) 30 MG capsule Take 1 capsule (30 mg total) by mouth at bedtime. 30  capsule 3   traZODone (DESYREL) 50 MG tablet Take 1 tablet (50 mg total) by mouth at bedtime. 90 tablet 3   HYDROcodone-acetaminophen (NORCO/VICODIN) 5-325 MG tablet Take 1 tablet by mouth daily. As needed. 60 tablet 0   traMADol (ULTRAM) 50 MG tablet Take 1 tablet (50 mg total) by mouth 2 (two) times daily as needed. (Patient not taking: Reported on 03/26/2021) 30 tablet 3   No current facility-administered medications for this visit.    OBJECTIVE: Vitals:   03/26/21 1039  BP: (!) 147/84  Pulse: 66  Resp: 18  Temp: 98.4 F (36.9 C)  SpO2: 99%  Body mass index is 30.48 kg/m.    ECOG FS:0 - Asymptomatic  General: Well-developed, well-nourished, no acute distress. Eyes: Pink conjunctiva, anicteric sclera. HEENT: Normocephalic, moist mucous membranes. Lungs: No audible wheezing or coughing. Heart: Regular rate and rhythm. Abdomen: Soft, nontender, no obvious distention. Musculoskeletal: No edema, cyanosis, or clubbing. Neuro: Alert, answering all questions appropriately. Cranial nerves grossly intact. Skin: No rashes or petechiae noted. Psych: Normal affect.  LAB RESULTS:  Lab Results  Component Value Date   NA 140 03/26/2021   K 4.3 03/26/2021   CL 106 03/26/2021   CO2 24 03/26/2021   GLUCOSE 111 (H) 03/26/2021   BUN 18 03/26/2021   CREATININE 1.29 (H) 03/26/2021   CALCIUM 9.4 03/26/2021   PROT 6.8 03/26/2021   ALBUMIN 4.2 03/26/2021   AST 19 03/26/2021   ALT 10 03/26/2021   ALKPHOS 56 03/26/2021   BILITOT 0.8 03/26/2021   GFRNONAA 56 (L) 03/26/2021   GFRAA >60 12/13/2019    Lab Results  Component Value Date   WBC 5.4 03/26/2021   NEUTROABS 3.6 03/26/2021   HGB 12.6 (L) 03/26/2021   HCT 36.7 (L) 03/26/2021   MCV 91.3 03/26/2021   PLT 171 03/26/2021     STUDIES: No results found.  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  PLAN:    1. Stage IV prostate cancer with bulky abdominal  lymphadenopathy and widespread bony metastasis: Initial CT and bone scan results on Sep 19, 2016 reviewed independently with widespread metastatic disease. Prostate biopsy results revealed Gleason's 8 (4+4) adenocarcinoma.  His most recent bone scan on January 15, 2017 revealed improvement of his bony metastasis.  Initially on Sep 16, 2016 patient's PSA was approximately 194.  Since August 2018 his PSA has ranged from 0.43-0.97.  His most recent result is 0.82.  Today's result is pending.  He does not require repeat imaging unless there is suspicion of progression of disease.  Continue Zytiga and prednisone as prescribed until intolerable side effects or definitive progression of disease.  Proceed with Xgeva today.  Return to clinic in 3 months with repeat laboratory work, further evaluation, and continuation of treatment.   2. Hypocalcemia: Resolved.   3. Pain: Chronic and unchanged.  Patient was given a refill of Vicodin today continue tramadol as needed. 4.  Hypokalemia: Resolved.   5.  Insomnia: Patient does not complain of this today. He was previously given a refill of trazodone and Restoril, but was instructed that further refills must be obtained from his primary care physician.   Patient expressed understanding and was in agreement with this plan. He also understands that He can call clinic at any time with any questions, concerns, or complaints.    Cancer Staging  Primary malignant 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 [facility]) Staging form: Prostate, AJCC 8th Edition - Clinical stage from 09/26/2016: Stage IVB (cTX, cN1, cM1c, PSA: 193) - Signed by Lloyd Huger, MD on 09/26/2016 Prostate specific antigen (PSA) range: 20 or greater   Lloyd Huger, MD   03/26/2021 12:45 PM

## 2021-03-26 ENCOUNTER — Inpatient Hospital Stay: Payer: No Typology Code available for payment source

## 2021-03-26 ENCOUNTER — Other Ambulatory Visit: Payer: Self-pay

## 2021-03-26 ENCOUNTER — Inpatient Hospital Stay: Payer: No Typology Code available for payment source | Attending: Oncology

## 2021-03-26 ENCOUNTER — Inpatient Hospital Stay (HOSPITAL_BASED_OUTPATIENT_CLINIC_OR_DEPARTMENT_OTHER): Payer: No Typology Code available for payment source | Admitting: Oncology

## 2021-03-26 VITALS — BP 147/84 | HR 66 | Temp 98.4°F | Resp 18 | Wt 186.0 lb

## 2021-03-26 DIAGNOSIS — C61 Malignant neoplasm of prostate: Secondary | ICD-10-CM

## 2021-03-26 DIAGNOSIS — C7951 Secondary malignant neoplasm of bone: Secondary | ICD-10-CM | POA: Insufficient documentation

## 2021-03-26 DIAGNOSIS — D649 Anemia, unspecified: Secondary | ICD-10-CM

## 2021-03-26 LAB — CBC WITH DIFFERENTIAL/PLATELET
Abs Immature Granulocytes: 0.01 10*3/uL (ref 0.00–0.07)
Basophils Absolute: 0 10*3/uL (ref 0.0–0.1)
Basophils Relative: 1 %
Eosinophils Absolute: 0.2 10*3/uL (ref 0.0–0.5)
Eosinophils Relative: 3 %
HCT: 36.7 % — ABNORMAL LOW (ref 39.0–52.0)
Hemoglobin: 12.6 g/dL — ABNORMAL LOW (ref 13.0–17.0)
Immature Granulocytes: 0 %
Lymphocytes Relative: 22 %
Lymphs Abs: 1.2 10*3/uL (ref 0.7–4.0)
MCH: 31.3 pg (ref 26.0–34.0)
MCHC: 34.3 g/dL (ref 30.0–36.0)
MCV: 91.3 fL (ref 80.0–100.0)
Monocytes Absolute: 0.4 10*3/uL (ref 0.1–1.0)
Monocytes Relative: 7 %
Neutro Abs: 3.6 10*3/uL (ref 1.7–7.7)
Neutrophils Relative %: 67 %
Platelets: 171 10*3/uL (ref 150–400)
RBC: 4.02 MIL/uL — ABNORMAL LOW (ref 4.22–5.81)
RDW: 13 % (ref 11.5–15.5)
WBC: 5.4 10*3/uL (ref 4.0–10.5)
nRBC: 0 % (ref 0.0–0.2)

## 2021-03-26 LAB — COMPREHENSIVE METABOLIC PANEL
ALT: 10 U/L (ref 0–44)
AST: 19 U/L (ref 15–41)
Albumin: 4.2 g/dL (ref 3.5–5.0)
Alkaline Phosphatase: 56 U/L (ref 38–126)
Anion gap: 10 (ref 5–15)
BUN: 18 mg/dL (ref 8–23)
CO2: 24 mmol/L (ref 22–32)
Calcium: 9.4 mg/dL (ref 8.9–10.3)
Chloride: 106 mmol/L (ref 98–111)
Creatinine, Ser: 1.29 mg/dL — ABNORMAL HIGH (ref 0.61–1.24)
GFR, Estimated: 56 mL/min — ABNORMAL LOW (ref >60)
Glucose, Bld: 111 mg/dL — ABNORMAL HIGH (ref 70–99)
Potassium: 4.3 mmol/L (ref 3.5–5.1)
Sodium: 140 mmol/L (ref 135–145)
Total Bilirubin: 0.8 mg/dL (ref 0.3–1.2)
Total Protein: 6.8 g/dL (ref 6.5–8.1)

## 2021-03-26 LAB — IRON AND TIBC
Iron: 98 ug/dL (ref 45–182)
Saturation Ratios: 29 % (ref 17.9–39.5)
TIBC: 335 ug/dL (ref 250–450)
UIBC: 237 ug/dL

## 2021-03-26 LAB — FERRITIN: Ferritin: 44 ng/mL (ref 24–336)

## 2021-03-26 LAB — PSA: Prostatic Specific Antigen: 0.91 ng/mL (ref 0.00–4.00)

## 2021-03-26 LAB — VITAMIN B12: Vitamin B-12: 195 pg/mL (ref 180–914)

## 2021-03-26 MED ORDER — DENOSUMAB 120 MG/1.7ML ~~LOC~~ SOLN
120.0000 mg | Freq: Once | SUBCUTANEOUS | Status: AC
  Administered 2021-03-26: 120 mg via SUBCUTANEOUS

## 2021-03-26 MED ORDER — HYDROCODONE-ACETAMINOPHEN 5-325 MG PO TABS: 1.0000 | ORAL_TABLET | Freq: Every day | ORAL | 0 refills | Status: DC

## 2021-03-26 NOTE — Progress Notes (Signed)
Patient reports low appetite. He would like a refill on hydrocodone/tylenol.

## 2021-05-23 ENCOUNTER — Other Ambulatory Visit: Payer: Self-pay | Admitting: Family Medicine

## 2021-05-23 NOTE — Telephone Encounter (Signed)
Last office visit 11/21/2020 with Romilda Garret for Abdominal Pain.  Last refilled 01/23/2021 for #30 with 3 refills.  CPE scheduled for 07/16/2021.

## 2021-05-25 NOTE — Telephone Encounter (Signed)
ERx 

## 2021-05-30 ENCOUNTER — Emergency Department
Admission: EM | Admit: 2021-05-30 | Discharge: 2021-05-30 | Disposition: A | Discharge status: Home / Self Care | Payer: No Typology Code available for payment source | Attending: Emergency Medicine | Admitting: Emergency Medicine

## 2021-05-30 ENCOUNTER — Emergency Department: Payer: No Typology Code available for payment source

## 2021-05-30 ENCOUNTER — Telehealth: Payer: Self-pay

## 2021-05-30 ENCOUNTER — Encounter: Payer: Self-pay | Admitting: Emergency Medicine

## 2021-05-30 ENCOUNTER — Other Ambulatory Visit: Payer: Self-pay

## 2021-05-30 DIAGNOSIS — I2510 Atherosclerotic heart disease of native coronary artery without angina pectoris: Secondary | ICD-10-CM | POA: Insufficient documentation

## 2021-05-30 DIAGNOSIS — I10 Essential (primary) hypertension: Secondary | ICD-10-CM | POA: Diagnosis not present

## 2021-05-30 DIAGNOSIS — R519 Headache, unspecified: Secondary | ICD-10-CM | POA: Insufficient documentation

## 2021-05-30 DIAGNOSIS — R112 Nausea with vomiting, unspecified: Secondary | ICD-10-CM | POA: Diagnosis present

## 2021-05-30 DIAGNOSIS — R110 Nausea: Secondary | ICD-10-CM

## 2021-05-30 DIAGNOSIS — Z8546 Personal history of malignant neoplasm of prostate: Secondary | ICD-10-CM | POA: Insufficient documentation

## 2021-05-30 LAB — COMPREHENSIVE METABOLIC PANEL
ALT: 10 U/L (ref 0–44)
AST: 18 U/L (ref 15–41)
Albumin: 4 g/dL (ref 3.5–5.0)
Alkaline Phosphatase: 63 U/L (ref 38–126)
Anion gap: 5 (ref 5–15)
BUN: 11 mg/dL (ref 8–23)
CO2: 27 mmol/L (ref 22–32)
Calcium: 8.7 mg/dL — ABNORMAL LOW (ref 8.9–10.3)
Chloride: 108 mmol/L (ref 98–111)
Creatinine, Ser: 1.07 mg/dL (ref 0.61–1.24)
GFR, Estimated: >60 mL/min (ref >60)
Glucose, Bld: 106 mg/dL — ABNORMAL HIGH (ref 70–99)
Potassium: 3.5 mmol/L (ref 3.5–5.1)
Sodium: 140 mmol/L (ref 135–145)
Total Bilirubin: 0.8 mg/dL (ref 0.3–1.2)
Total Protein: 6.9 g/dL (ref 6.5–8.1)

## 2021-05-30 LAB — CBC
HCT: 39.9 % (ref 39.0–52.0)
Hemoglobin: 13.7 g/dL (ref 13.0–17.0)
MCH: 30.9 pg (ref 26.0–34.0)
MCHC: 34.3 g/dL (ref 30.0–36.0)
MCV: 89.9 fL (ref 80.0–100.0)
Platelets: 197 10*3/uL (ref 150–400)
RBC: 4.44 MIL/uL (ref 4.22–5.81)
RDW: 12.8 % (ref 11.5–15.5)
WBC: 6.5 10*3/uL (ref 4.0–10.5)
nRBC: 0 % (ref 0.0–0.2)

## 2021-05-30 LAB — URINALYSIS, MICROSCOPIC (REFLEX)

## 2021-05-30 LAB — URINALYSIS, ROUTINE W REFLEX MICROSCOPIC
Bilirubin Urine: NEGATIVE
Glucose, UA: NEGATIVE mg/dL
Ketones, ur: 15 mg/dL — AB
Leukocytes,Ua: NEGATIVE
Nitrite: NEGATIVE
Protein, ur: NEGATIVE mg/dL
Specific Gravity, Urine: 1.015 (ref 1.005–1.030)
pH: 8.5 — ABNORMAL HIGH (ref 5.0–8.0)

## 2021-05-30 LAB — LIPASE, BLOOD: Lipase: 34 U/L (ref 11–51)

## 2021-05-30 MED ORDER — GADOBUTROL 1 MMOL/ML IV SOLN
8.0000 mL | Freq: Once | INTRAVENOUS | Status: AC | PRN
  Administered 2021-05-30: 8 mL via INTRAVENOUS

## 2021-05-30 MED ORDER — ONDANSETRON 4 MG PO TBDP: 4.0000 mg | ORAL_TABLET | Freq: Three times a day (TID) | ORAL | 0 refills | Status: AC | PRN

## 2021-05-30 MED ORDER — CEPHALEXIN 500 MG PO CAPS: 500.0000 mg | ORAL_CAPSULE | Freq: Four times a day (QID) | ORAL | 0 refills | Status: AC

## 2021-05-30 MED ORDER — ONDANSETRON HCL 4 MG/2ML IJ SOLN
4.0000 mg | Freq: Once | INTRAMUSCULAR | Status: AC
  Administered 2021-05-30: 4 mg via INTRAVENOUS
  Filled 2021-05-30: qty 2

## 2021-05-30 MED ORDER — SODIUM CHLORIDE 0.9 % IV BOLUS
500.0000 mL | Freq: Once | INTRAVENOUS | Status: AC
  Administered 2021-05-30: 500 mL via INTRAVENOUS

## 2021-05-30 NOTE — ED Provider Notes (Signed)
Medical screening examination/treatment/procedure(s) were conducted as a shared visit with non-physician practitioner(s) and myself.  I personally evaluated the patient during the encounter.    Delman Kitten, MD 05/30/21 [phone]

## 2021-05-30 NOTE — ED Notes (Signed)
Pt transported to MRI 

## 2021-05-30 NOTE — ED Notes (Signed)
Pt ambulatory to bathroom with steady gait.

## 2021-05-30 NOTE — ED Triage Notes (Signed)
Pt comes into the ED via POV c/o emesis x 3 days and head pains. Pt states he keeps getting intermittent "sharp shooting" pains on the back side of his head which is keeping him awake and then started causing the nausea.  Pt denies any abd pain.  Pt neurologically intact at this time and is ambulatory to triage with good gait.  PT in NAD.

## 2021-05-30 NOTE — ED Provider Notes (Signed)
Patient is alert and oriented in no acute distress.  Reports that shooting lancinating headache over the very vertex of his skull off-and-on.  He does have intermittent nausea and vomiting but he reports this seems to occur regularly when he takes all of his morning medications, has no associated abdominal pain.  Currently not any pain or distress.  CT of the head reviewed area of concern for sclerosis or metastatic disease to the skull.  Discussed with the patient and his family, we will proceed with MRI of the brain to evaluate further and rule out metastatic disease and evaluate for other cause of acute headache.  He does not have any evidence to support pain related to ocular or vascular etiology.  Neurologically intact.  No pain over the temporal regions or temporal tenderness.   Delman Kitten, MD 05/30/21 1351

## 2021-05-30 NOTE — ED Notes (Signed)
Pt given drink for PO challenge

## 2021-05-30 NOTE — Discharge Instructions (Addendum)
-  Take all of your antibiotics as prescribed. -Use ondansetron as needed for nausea. -Follow-up with your primary care provider as needed. -Return to the emergency department at any time if you begin to experience any new or worsening symptoms.

## 2021-05-30 NOTE — ED Provider Notes (Signed)
[facility] Provider Note    Event Date/Time   First MD Initiated Contact with Patient 05/30/21 1125     (approximate)   History   Chief Complaint Emesis (/) and Headache   HPI Justin Ali is a 81 y.o. male, history of prostate cancer, hypertension, obesity, CAD, fatty liver, presents to the emergency department for evaluation of headache and emesis.  Patient states that he has been experiencing nausea/vomiting for the past 3 days.  He states that every time that he tries to eat, he throws it back up.  Denies any blood in vomit, but states that it does appear brownish.  Additionally endorses shooting pain along the top of his head that radiate into the neck.  Denies fever/chills, diarrhea, dizziness/lightheadedness, abdominal pain, back pain, flank pain, or urinary symptoms.  History Limitations: No limitations      Physical Exam  Triage Vital Signs: ED Triage Vitals  Enc Vitals Group     BP 05/30/21 1059 (!) 140/97     Pulse Rate 05/30/21 1059 72     Resp 05/30/21 1059 16     Temp 05/30/21 1059 98.1 F (36.7 C)     Temp Source 05/30/21 1059 Oral     SpO2 05/30/21 1059 97 %     Weight 05/30/21 1058 186 lb 1.1 oz (84.4 kg)     Height 05/30/21 1058 5' 5.5" (1.664 m)     Head Circumference --      Peak Flow --      Pain Score 05/30/21 1058 0     Pain Loc --      Pain Edu? --      Excl. in [place]? --     Most recent vital signs: Vitals:   05/30/21 1059 05/30/21 1520  BP: (!) 140/97 127/76  Pulse: 72 80  Resp: 16 18  Temp: 98.1 F (36.7 C)   SpO2: 97% 96%    General: Awake, NAD.   CV: Good peripheral perfusion.  S1-S2 present.  No murmurs rubs or gallops. Resp: Normal effort.  Lung sounds clear bilaterally in the apices and bases. Abd: Soft, non-tender. No distention.  Neuro: At baseline. No gross neurological deficits. Other:   Physical Exam    ED Results / Procedures / Treatments  Labs (all labs ordered are listed, but only  abnormal results are displayed) Labs Reviewed  COMPREHENSIVE METABOLIC PANEL - Abnormal; Notable for the following components:      Result Value   Glucose, Bld 106 (*)    Calcium 8.7 (*)    All other components within normal limits  URINALYSIS, ROUTINE W REFLEX MICROSCOPIC - Abnormal; Notable for the following components:   pH 8.5 (*)    Hgb urine dipstick TRACE (*)    Ketones, ur 15 (*)    All other components within normal limits  URINALYSIS, MICROSCOPIC (REFLEX) - Abnormal; Notable for the following components:   Bacteria, UA MANY (*)    All other components within normal limits  LIPASE, BLOOD  CBC     EKG Sinus rhythm, rate of 64, PACs present, nonspecific ST segment abnormalities, but no notable ST segment elevation, no AV blocks, no axis deviations.   RADIOLOGY  ED Provider Interpretation: I personally reviewed and interpreted these images.  Head CT shows lytic defect concerning for metastatic disease.  MRI brain shows evidence of bone metastases, but no intracranial abnormalities.  CT Head Wo Contrast  Result Date: 05/30/2021 CLINICAL DATA:  Headaches.  Emesis.  History of prostate cancer. EXAM: CT HEAD WITHOUT CONTRAST TECHNIQUE: Contiguous axial images were obtained from the base of the skull through the vertex without intravenous contrast. RADIATION DOSE REDUCTION: This exam was performed according to the departmental dose-optimization program which includes automated exposure control, adjustment of the mA and/or kV according to patient size and/or use of iterative reconstruction technique. COMPARISON:  January 03, 2021. FINDINGS: Brain: Mild chronic ischemic white matter disease is noted. No mass effect or midline shift is noted. Ventricular size is within normal limits. There is no evidence of mass lesion, hemorrhage or acute infarction. Vascular: No hyperdense vessel or unexpected calcification. Skull: Lytic defect is seen involving the outer table of the left frontal skull  concerning for possible metastatic disease. Several sclerotic foci are noted in other portions of the skull concerning for metastatic disease. Sinuses/Orbits: No acute finding. Other: None. IMPRESSION: No acute intracranial abnormality seen. Large lytic defect seen involving outer table of left frontal skull concerning for possible metastatic disease. Several sclerotic foci are noted in other portions of the skull concerning for metastatic disease. Electronically Signed   By: Marijo Conception M.D.   On: 05/30/2021 12:04   MR BRAIN W WO CONTRAST  Result Date: 05/30/2021 CLINICAL DATA:  Headache and vomiting for 3 days. History of metastatic prostate cancer. EXAM: MRI HEAD WITHOUT AND WITH CONTRAST TECHNIQUE: Multiplanar, multiecho pulse sequences of the brain and surrounding structures were obtained without and with intravenous contrast. CONTRAST:  61mL GADAVIST GADOBUTROL 1 MMOL/ML IV SOLN COMPARISON:  Head CT 05/30/2021.  Orbit CT 03/15/2008. FINDINGS: Brain: There is no evidence of an acute infarct, intracranial hemorrhage, mass, midline shift, or extra-axial fluid collection. T2 hyperintensities in the cerebral white matter bilaterally are nonspecific but compatible with mild-to-moderate chronic small vessel ischemic disease. There is mild-to-moderate cerebral atrophy. No abnormal enhancement is identified. Vascular: Major intracranial vascular flow voids are preserved. Skull and upper cervical spine: There is sheet like/infiltrative T2 hyperintense, avidly enhancing soft tissue in the left frontal scalp with erosion of the outer table of the left frontal skull as seen on CT. This extends inferiorly into the left lateral periorbital soft tissues and into the lateral aspect of the left orbit, inseparable from the lacrimal gland, and extends to the skin in this region. This appears to be longstanding, as abnormal soft tissue was also present in these regions on the 2009 CT of the orbits. There are scattered  sclerotic lesions in the skull and included upper cervical spine which do not demonstrate substantial enhancement. Sinuses/Orbits: Bilateral cataract extraction. Clear paranasal sinuses. Trace right mastoid fluid. Other: None. IMPRESSION: 1. No evidence of acute intracranial abnormality or intracranial metastatic disease. 2. 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 3. Abnormal enhancing soft tissue in the left periorbital region and left frontal scalp with erosion of the outer table of the left frontal skull which is longstanding based on a 2009 CT. Per the electronic medical record, the patient has a history of a neurofibroma of the lateral periorbital area, and the appearance on MRI would be more consistent with this type of entity rather than a prostate cancer metastasis. 4. Scattered sclerotic lesions in the skull and upper cervical spine consistent with known prostate cancer metastases. Electronically Signed   By: Logan Bores M.D.   On: 05/30/2021 14:52    PROCEDURES:  Critical Care performed: None.  Procedures    MEDICATIONS ORDERED IN ED: Medications  sodium chloride 0.9 % bolus 500 mL (  0 mLs Intravenous Stopped 05/30/21 1427)  ondansetron (ZOFRAN) injection 4 mg (4 mg Intravenous Given 05/30/21 1226)  gadobutrol (GADAVIST) 1 MMOL/ML injection 8 mL (8 mLs Intravenous Contrast Given 05/30/21 1409)     IMPRESSION / MDM / ASSESSMENT AND PLAN / ED COURSE  I reviewed the triage vital signs and the nursing notes.                              Justin Ali is a 81 y.o. male, history of prostate cancer, hypertension, obesity, CAD, fatty liver, presents to the emergency department for evaluation of headache and emesis.  Patient states that he has been experiencing nausea/vomiting for the past 3 days.  He states that every time that he tries to eat, he throws it back up.  Denies any blood in vomit, but states that it does appear brownish.   Differential  diagnosis includes, but is not limited to, gastroenteritis, esophagitis, space-occupying lesion, brain metastases.  ED Course CBC shows no leukocytosis or anemia.  CMP unremarkable for significant electrolyte abnormalities.  Lipase unremarkable at 34.   CT significant for lytic lesion concerning for metastatic disease.  Will order MRI.  MRI shows no acute intracranial abnormalities.  Notable for bone metastases, otherwise no metastases in the brain.  Chronic findings shown, see above.  Urinalysis shows many bacteria, no leukocytes or nitrites.  Given age and male gender, will provide short course of antibiotics to cover for potential urinary tract infection.   Upon reevaluation, patient states that his nausea is much better than it was.  He is able to drink fluids without vomiting.  Assessment/Plan Patient appears well.  Given the patient's presentation and unremarkable work-up, I do not suspect a serious or life-threatening pathology.  Patient states that his nausea is no longer present after a dose of ondansetron and IV fluids.  Patient succeeded with p.o. challenge.  I do not suspect any significant acute abdominal process.  We will plan to discharge this patient with a prescription for ondansetron and cephalexin.  Spoke with attending Dr. Jacqualine Code, who agrees with plan. Advised him to follow-up with his primary care provider.  Patient was provided with anticipatory guidance, return precautions, and educational material. Encouraged the patient to return to the emergency department at any time if they begin to experience any new or worsening symptoms.       FINAL CLINICAL IMPRESSION(S) / ED DIAGNOSES   Final diagnoses:  Nausea     Rx / DC Orders   ED Discharge Orders          Ordered    ondansetron (ZOFRAN-ODT) 4 MG disintegrating tablet  Every 8 hours PRN        05/30/21 1546    cephALEXin (KEFLEX) 500 MG capsule  4 times daily        05/30/21 1546             Note:  This  document was prepared using Dragon voice recognition software and may include unintentional dictation errors.   Teodoro Spray, [place] 05/30/21 1604    Delman Kitten, MD 06/03/21 [phone]

## 2021-05-30 NOTE — ED Notes (Signed)
Pt reports feeling nauseas after PO challenge. PA made aware

## 2021-05-30 NOTE — Telephone Encounter (Signed)
Noted. Will await ER evaluation.  

## 2021-05-30 NOTE — Telephone Encounter (Signed)
[facility] - Client TELEPHONE ADVICE RECORD [facility] Patient Name: Justin Ali Gender: Male DOB: 07-30-1940 Age: 81 Y 92 M 9 D Return Phone Number: [phone] (Primary), [phone] (Secondary) Address: City/ State/ Zip: [place]  [address] Client [facility] - Client Client Site [place] Provider Ria Bush - MD Contact Type Call Who Is Calling Patient / Member / Family / Caregiver Call Type Triage / Clinical Caller Name [facility] Relationship To Patient Spouse Return Phone Number [phone] (Primary) Chief Complaint Vomiting Reason for Call Symptomatic / Request for [place] states his husband has been vomiting. Translation No Nurse Assessment Nurse: Gildardo Pounds, RN, Amy Date/Time (Eastern Time): 05/30/2021 9:31:56 AM Confirm and document reason for call. If symptomatic, describe symptoms. ---Caller states his husband has been vomiting. He has not had hardly anything to eat because he says he will just throw it up. It started yesterday at [facility]. He has vomited so much that she cannot know for sure, at least 10 times. No fever or diarrhea. Does the patient have any new or worsening symptoms? ---Yes Will a triage be completed? ---Yes Related visit to physician within the last 2 weeks? ---No Does the PT have any chronic conditions? (i.e. diabetes, asthma, this includes High risk factors for pregnancy, etc.) ---Yes List chronic conditions. ---prostate cancer Is this a behavioral health or substance abuse call? ---No Guidelines Guideline Title Affirmed Question Affirmed Notes Nurse Date/Time (Eastern Time) Vomiting [1] SEVERE vomiting (e.g., 6 or more times/day) AND [2] present > 8 hours (Exception: patient sounds well, is drinking liquids, does not sound dehydrated, and Lovelace, RN, Amy 05/30/2021 9:34:45 AM PLEASE NOTE: All  timestamps contained within this report are represented as [place] Standard Time. CONFIDENTIALTY NOTICE: This fax transmission is intended only for the addressee. It contains information that is legally privileged, confidential or otherwise protected from use or disclosure. If you are not the intended recipient, you are strictly prohibited from reviewing, disclosing, copying using or disseminating any of this information or taking any action in reliance on or regarding this information. If you have received this fax in error, please notify us immediately by telephone so that we can arrange for its return to [place]. Phone: [phone], Toll-Free: [phone], Fax: [phone] Page: 2 of 2 Call Id: [id number] Guidelines Guideline Title Affirmed Question Affirmed Notes Nurse Date/Time Eilene Ghazi Time) vomiting has lasted less than 24 hours) Disp. Time Eilene Ghazi Time) Disposition Final User 05/30/2021 9:37:11 AM Go to ED Now (or PCP triage) Yes Lovelace, RN, Amy Caller Disagree/Comply Comply Caller Understands Yes PreDisposition InappropriateToAsk Care Advice Given Per Guideline GO TO ED NOW (OR PCP TRIAGE): * IF NO PCP (PRIMARY CARE PROVIDER) SECOND-LEVEL TRIAGE: You need to be seen within the next hour. Go to the [facility] at _____________ [place] as soon as you can. BRING A BUCKET IN CASE OF VOMITING: * You may wish to bring a bucket, pan, or plastic bag with you in case there is more vomiting during the drive. BRING MEDICINES: * Please bring a list of your current medicines when you go to see the doctor. CARE ADVICE per Vomiting (Adult) guideline. Comments User: Wayne Sever, RN Date/Time Eilene Ghazi Time): 05/30/2021 9:38:06 AM Caller mentioned that the pt. has been having shooting pains from the back of the head to his neck. His neck was stiff this morning but has loosened up. Referrals [facility]

## 2021-05-30 NOTE — Telephone Encounter (Signed)
I spoke with pts wife and pt is going to [facility] ED for eval and treatment. Sending note to Dr Darnell Level (is out of office today), Dr Damita Dunnings who is in office today and lisa CMA.

## 2021-06-01 NOTE — Telephone Encounter (Signed)
ER records reviewed. Stable evaluation, nausea improved with zofran.

## 2021-06-17 ENCOUNTER — Other Ambulatory Visit: Payer: Self-pay | Admitting: Pharmacist

## 2021-06-17 DIAGNOSIS — C61 Malignant neoplasm of prostate: Secondary | ICD-10-CM

## 2021-06-17 MED ORDER — PREDNISONE 5 MG PO TABS: 5.0000 mg | ORAL_TABLET | Freq: Every day | ORAL | 5 refills | Status: DC

## 2021-06-17 MED ORDER — ABIRATERONE ACETATE 250 MG PO TABS: 1000.0000 mg | ORAL_TABLET | Freq: Every day | ORAL | 5 refills | Status: DC

## 2021-06-18 ENCOUNTER — Encounter: Payer: Self-pay | Admitting: Pharmacist

## 2021-06-21 NOTE — Progress Notes (Signed)
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  INTERVAL HISTORY: Patient returns to clinic today for routine 63-month evaluation and continuation of treatment.  He continues to tolerate his treatment with Zytiga, prednisone, and Xgeva without significant side effects. He has no neurologic complaints.  He has a good appetite and denies weight loss.  He denies any recent fevers or illnesses.  He denies any chest pain, shortness of breath, cough, or hemoptysis.  He denies any nausea, vomiting, constipation, or diarrhea. He has no urinary complaints.  Patient offers no specific complaints today.  REVIEW OF SYSTEMS:   Review of Systems  Constitutional: Negative.  Negative for fever, malaise/fatigue and weight loss.  Eyes: Negative.  Negative for blurred vision.  Respiratory: Negative.  Negative for cough and shortness of breath.   Cardiovascular: Negative.  Negative for chest pain and leg swelling.  Gastrointestinal: Negative.  Negative for abdominal pain.  Genitourinary: Negative.  Negative for frequency and urgency.  Musculoskeletal:  Positive for back pain and joint pain.  Skin: Negative.  Negative for rash.  Neurological: Negative.  Negative for dizziness, sensory change, focal weakness and weakness.  Psychiatric/Behavioral: Negative.  The patient is not nervous/anxious and does not have insomnia.    As per HPI. Otherwise, a complete review of systems is negative.  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   2 mm - non-bleeding  03/18/2013 colonoscopy    CAD (coronary artery disease), native coronary artery 2014   By CT lung (04/2013)    Cataract    HLD (hyperlipidemia)    HTN (hypertension)    Hyperglycemia    NAFLD (nonalcoholic fatty liver disease) 08/2011   by abd [place] with increased LFTs   OSA (obstructive sleep apnea)    did not tolerate CPAP   Other benign neoplasm of connective and other soft tissue of unspecified site    Neurofibroma of lateral periorbital area   Peripheral neuropathy    Personal history of colonic adenomas 08/05/2012   Pneumonia 01/2013   CAP LUL   Restless legs syndrome (RLS)    Sleep apnea    no cpap    PAST SURGICAL HISTORY: Past Surgical History:  Procedure Laterality Date   COLONOSCOPY  07/2012   5 adenomas (tubular, TV, serrated), rec rpt 5 months [name])   COLONOSCOPY  02/2013   residual adenomas, cecal AVM, rec rpt 1 yr [name])   COLONOSCOPY  03/2014   no residual polyp, cecal AVM, rpt 3-4 yrs [name])   ECTROPION REPAIR Bilateral 09/29/2017   Procedure: REPAIR OF ECTROPION, EXTENSIVE UPPER AND LOWER;  Surgeon: Karle Starch, MD;  Location: [facility];  Service: Ophthalmology;  Laterality: Bilateral;   ORIF ANKLE FRACTURE  11/29/00   Dr. Tamala Julian   PTOSIS REPAIR Bilateral 09/29/2017   Procedure: BLEPHAROPTOSIS REPAIR RESECT EX UPPER;  Surgeon: Karle Starch, MD;  Location: [facility];  Service: Ophthalmology;  Laterality: Bilateral;   RECONSTRUCTION OF EYELID Bilateral 05/31/2018   SKIN CANCER EXCISION  1997   left anterior neck    FAMILY HISTORY: Family History  Problem Relation Age of Onset   Cancer Father  liver   Alcohol abuse Father    Liver cancer Father    Cancer Mother        breast   Breast cancer Mother    Hypertension Brother        Half-brother   Coronary artery disease Paternal Aunt        CABG   Cancer Other        GM; Aunt - breast   Colon cancer Neg Hx    Rectal cancer Neg Hx    Stomach cancer Neg Hx    Esophageal  cancer Neg Hx     ADVANCED DIRECTIVES (Y/N):  N  HEALTH MAINTENANCE: Social History   Tobacco Use   Smoking status: Former    Types: Cigarettes, Cigars    Quit date: 04/28/2002    Years since quitting: 19.1   Smokeless tobacco: Never   Tobacco comments:    rare cigar  Vaping Use   Vaping Use: Never used  Substance Use Topics   Alcohol use: Not Currently   Drug use: No     Colonoscopy:  PAP:  Bone density:  Lipid panel:  No Known Allergies  Current Outpatient Medications  Medication Sig Dispense Refill   abiraterone acetate (ZYTIGA) 250 MG tablet Take 4 tablets (1,000 mg total) by mouth daily. Take on an empty stomach 1 hour before or 2 hours after a meal 120 tablet 5   amLODipine (NORVASC) 5 MG tablet Take 1 tablet (5 mg total) by mouth daily. 90 tablet 3   CALCIUM PO Take 1,200 mg by mouth daily.     cholecalciferol (VITAMIN D) 1000 UNITS tablet Take 2,000 Units by mouth daily.      gabapentin (NEURONTIN) 300 MG capsule Take 1 capsule (300 mg total) by mouth at bedtime. 90 capsule 3   HYDROcodone-acetaminophen (NORCO/VICODIN) 5-325 MG tablet Take 1 tablet by mouth daily. As needed. 60 tablet 0   lisinopril (PRINIVIL,ZESTRIL) 5 MG tablet Take 1 tablet (5 mg total) by mouth daily. 90 tablet 3   Multiple Vitamins-Minerals (CENTRUM SILVER 50+MEN) TABS Take by mouth daily.     Omega-3 Fatty Acids (FISH OIL) 1000 MG CAPS Take 2 capsules (2,000 mg total) by mouth daily.     potassium chloride SA (KLOR-CON) 20 MEQ tablet Take 1 tablet (20 mEq total) by mouth 2 (two) times daily. (Patient taking differently: Take 20 mEq by mouth 2 (two) times daily. Pt reports only takes once a day) 10 tablet 0   predniSONE (DELTASONE) 5 MG tablet Take 1 tablet (5 mg total) by mouth daily with breakfast. 30 tablet 5   temazepam (RESTORIL) 30 MG capsule Take 1 capsule (30 mg total) by mouth at bedtime. 30 capsule 3   traZODone (DESYREL) 50 MG tablet Take 1 tablet (50 mg total) by mouth at bedtime. 90  tablet 3   clotrimazole (LOTRIMIN) 1 % cream Apply 1 application topically 2 (two) times daily. For 2 weeks or until full resolution (Patient not taking: Reported on 06/25/2021) 30 g 0   traMADol (ULTRAM) 50 MG tablet Take 1 tablet (50 mg total) by mouth 2 (two) times daily as needed. (Patient not taking: Reported on 03/26/2021) 30 tablet 3   No current facility-administered medications for this visit.    OBJECTIVE: Vitals:   06/25/21 1438  BP: 123/75  Pulse: 75  Resp: 16  Temp: 97.9 F (36.6 C)  SpO2: 99%     Body mass index is 31.32 kg/m.    ECOG FS:0 - Asymptomatic  General: Well-developed, well-nourished, no acute distress. Eyes: Pink conjunctiva, anicteric sclera. HEENT: Normocephalic, moist mucous membranes. Lungs: No audible wheezing or coughing. Heart: Regular rate and rhythm. Abdomen: Soft, nontender, no obvious distention. Musculoskeletal: No edema, cyanosis, or clubbing. Neuro: Alert, answering all questions appropriately. Cranial nerves grossly intact. Skin: No rashes or petechiae noted. Psych: Normal affect.  LAB RESULTS:  Lab Results  Component Value Date   NA 139 06/25/2021   K 4.2 06/25/2021   CL 105 06/25/2021   CO2 29 06/25/2021   GLUCOSE 125 (H) 06/25/2021   BUN 18 06/25/2021   CREATININE 1.26 (H) 06/25/2021   CALCIUM 9.2 06/25/2021   PROT 6.6 06/25/2021   ALBUMIN 3.9 06/25/2021   AST 18 06/25/2021   ALT 12 06/25/2021   ALKPHOS 55 06/25/2021   BILITOT 0.3 06/25/2021   GFRNONAA 58 (L) 06/25/2021   GFRAA >60 12/13/2019    Lab Results  Component Value Date   WBC 5.0 06/25/2021   NEUTROABS 3.5 06/25/2021   HGB 13.3 06/25/2021   HCT 38.3 (L) 06/25/2021   MCV 91.0 06/25/2021   PLT 192 06/25/2021     STUDIES: CT Head Wo Contrast  Result Date: 05/30/2021 CLINICAL DATA:  Headaches.  Emesis.  History of prostate cancer. EXAM: CT HEAD WITHOUT CONTRAST TECHNIQUE: Contiguous axial images were obtained from the base of the skull through the vertex  without intravenous contrast. RADIATION DOSE REDUCTION: This exam was performed according to the departmental dose-optimization program which includes automated exposure control, adjustment of the mA and/or kV according to patient size and/or use of iterative reconstruction technique. COMPARISON:  January 03, 2021. FINDINGS: Brain: Mild chronic ischemic white matter disease is noted. No mass effect or midline shift is noted. Ventricular size is within normal limits. There is no evidence of mass lesion, hemorrhage or acute infarction. Vascular: No hyperdense vessel or unexpected calcification. Skull: Lytic defect is seen involving the outer table of the left frontal skull concerning for possible metastatic disease. Several sclerotic foci are noted in other portions of the skull concerning for metastatic disease. Sinuses/Orbits: No acute finding. Other: None. IMPRESSION: No acute intracranial abnormality seen. Large lytic defect seen involving outer table of left frontal skull concerning for possible metastatic disease. Several sclerotic foci are noted in other portions of the skull concerning for metastatic disease. Electronically Signed   By: Marijo Conception M.D.   On: 05/30/2021 12:04   MR BRAIN W WO CONTRAST  Result Date: 05/30/2021 CLINICAL DATA:  Headache and vomiting for 3 days. History of metastatic prostate cancer. EXAM: MRI HEAD WITHOUT AND WITH CONTRAST TECHNIQUE: Multiplanar, multiecho pulse sequences of the brain and surrounding structures were obtained without and with intravenous contrast. CONTRAST:  77mL GADAVIST GADOBUTROL 1 MMOL/ML IV SOLN COMPARISON:  Head CT 05/30/2021.  Orbit CT 03/15/2008. FINDINGS: Brain: There is no evidence of an acute infarct, intracranial hemorrhage, mass, midline shift, or extra-axial fluid collection. T2 hyperintensities in the cerebral white matter bilaterally are nonspecific but compatible with mild-to-moderate chronic small vessel ischemic disease. There is  mild-to-moderate cerebral atrophy. No abnormal enhancement is identified. Vascular: Major intracranial vascular flow voids are preserved. Skull and upper cervical spine: There is sheet like/infiltrative T2 hyperintense, avidly enhancing soft tissue in the left frontal scalp with erosion of the outer table of the left frontal skull as seen on CT. This extends inferiorly into the left lateral periorbital soft tissues and into the lateral aspect of the left orbit, inseparable from the lacrimal gland, and extends to the  skin in this region. This appears to be longstanding, as abnormal soft tissue was also present in these regions on the 2009 CT of the orbits. There are scattered sclerotic lesions in the skull and included upper cervical spine which do not demonstrate substantial enhancement. Sinuses/Orbits: Bilateral cataract extraction. Clear paranasal sinuses. Trace right mastoid fluid. Other: None. IMPRESSION: 1. No evidence of acute intracranial abnormality or intracranial metastatic disease. 2. 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 3. Abnormal enhancing soft tissue in the left periorbital region and left frontal scalp with erosion of the outer table of the left frontal skull which is longstanding based on a 2009 CT. Per the electronic medical record, the patient has a history of a neurofibroma of the lateral periorbital area, and the appearance on MRI would be more consistent with this type of entity rather than a prostate cancer metastasis. 4. Scattered sclerotic lesions in the skull and upper cervical spine consistent with known prostate cancer metastases. Electronically Signed   By: Logan Bores M.D.   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  PLAN:    1. 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 Prostate biopsy results revealed Gleason's 8 (4+4) adenocarcinoma.  His most recent bone scan on January 15, 2017 revealed improvement of his bony metastasis.  Initially on Sep 16, 2016 patient's PSA was approximately 194.  Since August 2018 his PSA has ranged from 0.43-0.97.  His most recent result is 0.91.  Today's result is pending. He does not require repeat imaging unless there is suspicion of progression of disease.  Continue Zytiga and prednisone as prescribed until intolerable side effects or definitive progression of disease.  Proceed with Xgeva today.  Return to clinic in 3 months with repeat laboratory work, further evaluation, and continuation of treatment. 2. Pain: Chronic and unchanged.  Patient uses Vicodin and tramadol sparingly.   3.  Renal insufficiency: Patient's creatinine is 1.26 today.  Monitor. 4.  Insomnia: Patient does not complain of this today. He was previously given a refill of trazodone and Restoril, but was instructed that further refills must be obtained from his primary care physician.  I spent a total of 30 minutes reviewing chart data, face-to-face evaluation with the patient, counseling and coordination of care as detailed above.    Patient expressed understanding and was in agreement with this plan. He also understands that He can call clinic at any time with any questions, concerns, or complaints.    Cancer Staging  Primary malignant 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 [facility]) Staging form: Prostate, AJCC 8th Edition - Clinical stage from 09/26/2016: Stage IVB (cTX, cN1, cM1c, PSA: 193) - Signed by Lloyd Huger, MD on 09/26/2016 Prostate specific antigen (PSA) range: 20 or greater   Lloyd Huger, MD   06/25/2021 3:29 PM

## 2021-06-25 ENCOUNTER — Inpatient Hospital Stay (HOSPITAL_BASED_OUTPATIENT_CLINIC_OR_DEPARTMENT_OTHER): Payer: No Typology Code available for payment source | Admitting: Oncology

## 2021-06-25 ENCOUNTER — Other Ambulatory Visit: Payer: Self-pay | Admitting: Emergency Medicine

## 2021-06-25 ENCOUNTER — Inpatient Hospital Stay: Payer: No Typology Code available for payment source | Attending: Oncology

## 2021-06-25 ENCOUNTER — Inpatient Hospital Stay: Payer: No Typology Code available for payment source

## 2021-06-25 ENCOUNTER — Other Ambulatory Visit: Payer: Self-pay

## 2021-06-25 VITALS — BP 123/75 | HR 75 | Temp 97.9°F | Resp 16 | Wt 191.1 lb

## 2021-06-25 DIAGNOSIS — C61 Malignant neoplasm of prostate: Secondary | ICD-10-CM | POA: Insufficient documentation

## 2021-06-25 DIAGNOSIS — C7951 Secondary malignant neoplasm of bone: Secondary | ICD-10-CM

## 2021-06-25 LAB — COMPREHENSIVE METABOLIC PANEL
ALT: 12 U/L (ref 0–44)
AST: 18 U/L (ref 15–41)
Albumin: 3.9 g/dL (ref 3.5–5.0)
Alkaline Phosphatase: 55 U/L (ref 38–126)
Anion gap: 5 (ref 5–15)
BUN: 18 mg/dL (ref 8–23)
CO2: 29 mmol/L (ref 22–32)
Calcium: 9.2 mg/dL (ref 8.9–10.3)
Chloride: 105 mmol/L (ref 98–111)
Creatinine, Ser: 1.26 mg/dL — ABNORMAL HIGH (ref 0.61–1.24)
GFR, Estimated: 58 mL/min — ABNORMAL LOW (ref >60)
Glucose, Bld: 125 mg/dL — ABNORMAL HIGH (ref 70–99)
Potassium: 4.2 mmol/L (ref 3.5–5.1)
Sodium: 139 mmol/L (ref 135–145)
Total Bilirubin: 0.3 mg/dL (ref 0.3–1.2)
Total Protein: 6.6 g/dL (ref 6.5–8.1)

## 2021-06-25 LAB — CBC WITH DIFFERENTIAL/PLATELET
Abs Immature Granulocytes: 0.04 10*3/uL (ref 0.00–0.07)
Basophils Absolute: 0 10*3/uL (ref 0.0–0.1)
Basophils Relative: 1 %
Eosinophils Absolute: 0.1 10*3/uL (ref 0.0–0.5)
Eosinophils Relative: 1 %
HCT: 38.3 % — ABNORMAL LOW (ref 39.0–52.0)
Hemoglobin: 13.3 g/dL (ref 13.0–17.0)
Immature Granulocytes: 1 %
Lymphocytes Relative: 24 %
Lymphs Abs: 1.2 10*3/uL (ref 0.7–4.0)
MCH: 31.6 pg (ref 26.0–34.0)
MCHC: 34.7 g/dL (ref 30.0–36.0)
MCV: 91 fL (ref 80.0–100.0)
Monocytes Absolute: 0.2 10*3/uL (ref 0.1–1.0)
Monocytes Relative: 5 %
Neutro Abs: 3.5 10*3/uL (ref 1.7–7.7)
Neutrophils Relative %: 68 %
Platelets: 192 10*3/uL (ref 150–400)
RBC: 4.21 MIL/uL — ABNORMAL LOW (ref 4.22–5.81)
RDW: 12.8 % (ref 11.5–15.5)
WBC: 5 10*3/uL (ref 4.0–10.5)
nRBC: 0 % (ref 0.0–0.2)

## 2021-06-25 LAB — PSA: Prostatic Specific Antigen: 0.97 ng/mL (ref 0.00–4.00)

## 2021-06-25 MED ORDER — DENOSUMAB 120 MG/1.7ML ~~LOC~~ SOLN
120.0000 mg | Freq: Once | SUBCUTANEOUS | Status: AC
  Administered 2021-06-25: 120 mg via SUBCUTANEOUS
  Filled 2021-06-25: qty 1.7

## 2021-06-25 MED ORDER — HYDROCODONE-ACETAMINOPHEN 5-325 MG PO TABS: 1.0000 | ORAL_TABLET | Freq: Every day | ORAL | 0 refills | Status: DC

## 2021-06-25 NOTE — Progress Notes (Signed)
Pt requesting refill of hydrocodone. 

## 2021-07-04 NOTE — Progress Notes (Signed)
Subjective:   Justin Ali is a 81 y.o. male who presents for Medicare Annual/Subsequent preventive examination.  I connected with Howell Pringle today by telephone and verified that I am speaking with the correct person using two identifiers. Location patient: home Location provider: work Persons participating in the virtual visit: patient, Marine scientist.    I discussed the limitations, risks, security and privacy concerns of performing an evaluation and management service by telephone and the availability of in person appointments. I also discussed with the patient that there may be a patient responsible charge related to this service. The patient expressed understanding and verbally consented to this telephonic visit.    Interactive audio and video telecommunications were attempted between this provider and patient, however failed, due to patient having technical difficulties OR patient did not have access to video capability.  We continued and completed visit with audio only.  Some vital signs may be absent or patient reported.   Time Spent with patient on telephone encounter: 25 minutes  Review of Systems     Cardiac Risk Factors include: advanced age (>36mn, >>52women);hypertension     Objective:    Today's Vitals   07/05/21 1358  Weight: 191 lb (86.6 kg)  Height: '5\' 5"'$  (1.651 m)   Body mass index is 31.78 kg/m.  Advanced Directives 07/05/2021 06/25/2021 05/30/2021 12/18/2020 07/04/2020 06/14/2020 03/14/2020  Does Patient Have a Medical Advance Directive? Yes Yes Yes Yes Yes Yes Yes  Type of AParamedicof AStarLiving will Living will;Out of facility DNR (pink MOST or yellow form) [place]Living will HEsterLiving will HBostonLiving will HSmithville FlatsLiving will [place]Living will  Does patient want to make changes to medical advance directive? Yes  (MAU/Ambulatory/Procedural Areas - Information given) No - Patient declined - - - Yes (ED - Information included in AVS) -  Copy of [place] Chart? Yes - validated most recent copy scanned in chart (See row information) - - - Yes - validated most recent copy scanned in chart (See row information) - -  Would patient like information on creating a medical advance directive? - No - Patient declined - - - - -    Current Medications (verified) Outpatient Encounter Medications as of 07/05/2021  Medication Sig   abiraterone acetate (ZYTIGA) 250 MG tablet Take 4 tablets (1,000 mg total) by mouth daily. Take on an empty stomach 1 hour before or 2 hours after a meal   amLODipine (NORVASC) 5 MG tablet Take 1 tablet (5 mg total) by mouth daily.   CALCIUM PO Take 1,200 mg by mouth daily.   cholecalciferol (VITAMIN D) 1000 UNITS tablet Take 2,000 Units by mouth daily.    gabapentin (NEURONTIN) 300 MG capsule Take 1 capsule (300 mg total) by mouth at bedtime.   HYDROcodone-acetaminophen (NORCO/VICODIN) 5-325 MG tablet Take 1 tablet by mouth daily. As needed.   lisinopril (PRINIVIL,ZESTRIL) 5 MG tablet Take 1 tablet (5 mg total) by mouth daily.   Multiple Vitamins-Minerals (CENTRUM SILVER 50+MEN) TABS Take by mouth daily.   Omega-3 Fatty Acids (FISH OIL) 1000 MG CAPS Take 2 capsules (2,000 mg total) by mouth daily.   potassium chloride SA (KLOR-CON) 20 MEQ tablet Take 1 tablet (20 mEq total) by mouth 2 (two) times daily. (Patient taking differently: Take 20 mEq by mouth 2 (two) times daily. Pt reports only takes once a day)   predniSONE (DELTASONE) 5 MG tablet  Take 1 tablet (5 mg total) by mouth daily with breakfast.   temazepam (RESTORIL) 30 MG capsule Take 1 capsule (30 mg total) by mouth at bedtime.   traMADol (ULTRAM) 50 MG tablet Take 1 tablet (50 mg total) by mouth 2 (two) times daily as needed.   traZODone (DESYREL) 50 MG tablet Take 1 tablet (50 mg total) by mouth at bedtime.    clotrimazole (LOTRIMIN) 1 % cream Apply 1 application topically 2 (two) times daily. For 2 weeks or until full resolution (Patient not taking: Reported on 06/25/2021)   No facility-administered encounter medications on file as of 07/05/2021.    Allergies (verified) Patient has no known allergies.   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   2 mm - non-bleeding 03/18/2013 colonoscopy    CAD (coronary artery disease), native coronary artery 2014   By CT lung (04/2013)    Cataract    HLD (hyperlipidemia)    HTN (hypertension)    Hyperglycemia    NAFLD (nonalcoholic fatty liver disease) 08/2011   by abd [place] with increased LFTs   OSA (obstructive sleep apnea)    did not tolerate CPAP   Other benign neoplasm of connective and other soft tissue of unspecified site    Neurofibroma of lateral periorbital area   Peripheral neuropathy    Personal history of colonic adenomas 08/05/2012   Pneumonia 01/2013   CAP LUL   Restless legs syndrome (RLS)    Sleep apnea    no cpap   Past Surgical History:  Procedure Laterality Date   COLONOSCOPY  07/2012   5 adenomas (tubular, TV, serrated), rec rpt 5 months [name])   COLONOSCOPY  02/2013   residual adenomas, cecal AVM, rec rpt 1 yr [name])   COLONOSCOPY  03/2014   no residual polyp, cecal AVM, rpt 3-4 yrs [name])   ECTROPION REPAIR Bilateral 09/29/2017   Procedure: REPAIR OF ECTROPION, EXTENSIVE UPPER AND LOWER;  Surgeon: Karle Starch, MD;  Location: [facility];  Service: Ophthalmology;  Laterality: Bilateral;   ORIF ANKLE FRACTURE  11/29/00   Dr. Tamala Julian   PTOSIS REPAIR Bilateral 09/29/2017   Procedure: BLEPHAROPTOSIS REPAIR RESECT EX UPPER;  Surgeon: Karle Starch, MD;  Location: [facility];  Service: Ophthalmology;  Laterality: Bilateral;   RECONSTRUCTION OF EYELID Bilateral 05/31/2018   SKIN CANCER EXCISION  1997   left anterior neck   Family History  Problem Relation Age of  Onset   Cancer Father        liver   Alcohol abuse Father    Liver cancer Father    Cancer Mother        breast   Breast cancer Mother    Hypertension Brother        Half-brother   Coronary artery disease Paternal Aunt        CABG   Cancer Other        GM; Aunt - breast   Colon cancer Neg Hx    Rectal cancer Neg Hx    Stomach cancer Neg Hx    Esophageal cancer Neg Hx    Social History   Socioeconomic History   Marital status: Married    Spouse name: Not on file   Number of children: 2   Years of education: Not on file   Highest education level: Not on file  Occupational History   Occupation: Truck driver    Comment: Museum/gallery exhibitions officer  Tobacco Use  Smoking status: Former    Types: Cigarettes, Cigars    Quit date: 04/28/2002    Years since quitting: 19.2   Smokeless tobacco: Never   Tobacco comments:    rare cigar  Vaping Use   Vaping Use: Never used  Substance and Sexual Activity   Alcohol use: Not Currently   Drug use: No   Sexual activity: Not on file  Other Topics Concern   Not on file  Social History Narrative   Caffeine: occasional coffee   Lives with wife, 1 cat   Some care through the [place] - served 8 years in the [employer]   Occupation: truck driver Adult nurse)   Activity: goes to [employer] 3x/wk   Diet: rare water, lots of sweet tea, some fruits/vegetables   Social Determinants of Radio broadcast assistant Strain: Low Risk    Difficulty of Paying Living Expenses: Not hard at all  Food Insecurity: No Food Insecurity   Worried About Charity fundraiser in the Last Year: Never true   Arboriculturist in the Last Year: Never true  Transportation Needs: No Transportation Needs   Lack of Transportation (Medical): No   Lack of Transportation (Non-Medical): No  Physical Activity: Inactive   Days of Exercise per Week: 0 days   Minutes of Exercise per Session: 0 min  Stress: No Stress Concern Present   Feeling of Stress : Not at all  Social Connections:  Moderately Isolated   Frequency of Communication with Friends and Family: Twice a week   Frequency of Social Gatherings with Friends and Family: Once a week   Attends Religious Services: Never   Marine scientist or Organizations: No   Attends Music therapist: Never   Marital Status: Married    Tobacco Counseling Counseling given: Not Answered Tobacco comments: rare cigar   Clinical Intake:  Pre-visit preparation completed: Yes  Pain : No/denies pain     BMI - recorded: 31.78 Nutritional Status: BMI > 30  Obese Nutritional Risks: Unintentional weight loss (patient has bone cancer) Diabetes: No  How often do you need to have someone help you when you read instructions, pamphlets, or other written materials from your doctor or pharmacy?: 1 - Never  Diabetic? No  Interpreter Needed?: No  Information entered by :: Orrin Brigham LPN   Activities of Daily Living In your present state of health, do you have any difficulty performing the following activities: 07/05/2021  Hearing? Y  Vision? N  Difficulty concentrating or making decisions? N  Walking or climbing stairs? N  Dressing or bathing? N  Doing errands, shopping? N  Preparing Food and eating ? N  Using the Toilet? N  In the past six months, have you accidently leaked urine? N  Do you have problems with loss of bowel control? N  Managing your Medications? N  Managing your Finances? N  Housekeeping or managing your Housekeeping? N  Some recent data might be hidden    Patient Care Team: Ria Bush, MD as PCP - General (Family Medicine) Birder Robson, MD as Referring Physician (Ophthalmology) Lloyd Huger, MD as Consulting Physician (Oncology)  Indicate any recent Medical Services you may have received from other than [facility] providers in the past year (date may be approximate).     Assessment:   This is a routine wellness examination for Kesler.  Hearing/Vision  screen Hearing Screening - Comments:: Has hearing aids  Vision Screening - Comments:: Last exam 2022, [facility]  Dietary issues and exercise activities discussed: Current Exercise Habits: The patient does not participate in regular exercise at present (exercises outdoors during the warmer months)   Goals Addressed             This Visit's Progress    Patient Stated       Would like to maintain current routine        Depression Screen PHQ 2/9 Scores 07/05/2021 07/04/2020 06/30/2019 06/22/2018 06/16/2017 06/13/2016 06/12/2015  PHQ - 2 Score 0 0 0 0 0 0 0  PHQ- 9 Score - 0 0 0 0 - -    Fall Risk Fall Risk  07/05/2021 07/04/2020 06/30/2019 06/22/2018 06/16/2017  Falls in the past year? 0 1 1 0 Yes  Comment - - fell on ice - tripped over rocks and injured left knee  Number falls in past yr: 0 0 0 - 1  Injury with Fall? 0 0 0 - Yes  Risk for fall due to : No Fall Risks Medication side effect Medication side effect - -  Follow up Falls prevention discussed Falls evaluation completed;Falls prevention discussed Falls evaluation completed;Falls prevention discussed - -    FALL RISK PREVENTION PERTAINING TO THE HOME:  Any stairs in or around the home? Yes  If so, are there any without handrails? No  Home free of loose throw rugs in walkways, pet beds, electrical cords, etc? Yes  Adequate lighting in your home to reduce risk of falls? Yes   ASSISTIVE DEVICES UTILIZED TO PREVENT FALLS:  Life alert? No  Use of a cane, walker or w/c? No  Grab bars in the bathroom? Yes  Shower chair or bench in shower? Yes  Elevated toilet seat or a handicapped toilet? Yes   TIMED UP AND GO:  Was the test performed? No .    Cognitive Function: Normal cognitive status assessed by this Nurse Health Advisor. No abnormalities found.   MMSE - Mini Mental State Exam 07/04/2020 06/30/2019 06/22/2018 06/16/2017 06/13/2016  Not completed: Refused - - - -  Orientation to time - '5 5 5 5  '$ Orientation  to Place - '5 5 5 5  '$ Registration - '3 3 3 3  '$ Attention/ Calculation - 5 0 0 0  Recall - '3 1 3 3  '$ Recall-comments - - unable to recall 2 of 3 words - -  Language- name 2 objects - - 0 0 0  Language- repeat - '1 1 1 1  '$ Language- follow 3 step command - - '3 3 3  '$ Language- read & follow direction - - 0 0 0  Write a sentence - - 0 0 0  Copy design - - 0 0 0  Total score - - '18 20 20        '$ Immunizations Immunization History  Administered Date(s) Administered   Fluad Quad(high Dose 65+) 12/28/2018   Influenza Whole 01/24/2008, 01/08/2009, 02/08/2013   Influenza, High Dose Seasonal PF 01/26/2018, 12/28/2018   Influenza, Seasonal, Injecte, Preservative Fre 01/04/2014   Influenza,inj,Quad PF,6+ Mos 01/02/2015, 01/09/2016, 01/02/2017, 01/14/2018   PFIZER(Purple Top)SARS-COV-2 Vaccination 05/12/2019, 06/02/2019, 04/26/2020   Pneumococcal Conjugate-13 06/02/2013   Pneumococcal Polysaccharide-23 06/12/2010   Td 08/27/2002, 04/28/2010   Tdap 04/28/2010    TDAP status: Due, Education has been provided regarding the importance of this vaccine. Advised may receive this vaccine at local pharmacy or Health Dept. Aware to provide a copy of the vaccination record if obtained from local pharmacy or Health Dept. Verbalized acceptance and understanding.  Flu Vaccine status: Declined, Education has been provided regarding the importance of this vaccine but patient still declined. Advised may receive this vaccine at local pharmacy or Health Dept. Aware to provide a copy of the vaccination record if obtained from local pharmacy or Health Dept. Verbalized acceptance and understanding.  Pneumococcal vaccine status: Up to date  Covid-19 vaccine status: Declined, Education has been provided regarding the importance of this vaccine but patient still declined. Advised may receive this vaccine at local pharmacy or Health Dept.or vaccine clinic. Aware to provide a copy of the vaccination record if obtained from  local pharmacy or Health Dept. Verbalized acceptance and understanding.  Qualifies for Shingles Vaccine? Yes   Zostavax completed No   Shingrix Completed?: No.    Education has been provided regarding the importance of this vaccine. Patient has been advised to call insurance company to determine out of pocket expense if they have not yet received this vaccine. Advised may also receive vaccine at local pharmacy or Health Dept. Verbalized acceptance and understanding.  Screening Tests Health Maintenance  Topic Date Due   Zoster Vaccines- Shingrix (1 of 2) Never done   COVID-19 Vaccine (4 - Booster for Pfizer series) 06/21/2020   INFLUENZA VACCINE  11/26/2020   COLONOSCOPY (Pts 45-1yr Insurance coverage will need to be confirmed)  07/05/2022 (Originally 03/22/2017)   TETANUS/TDAP  07/05/2023 (Originally 04/28/2020)   Pneumonia Vaccine 80 Years old  Completed   Hepatitis C Screening  Completed   HPV VACCINES  Aged Out    Health Maintenance  Health Maintenance Due  Topic Date Due   Zoster Vaccines- Shingrix (1 of 2) Never done   COVID-19 Vaccine (4 - Booster for Pfizer series) 06/21/2020   INFLUENZA VACCINE  11/26/2020    Colorectal cancer screening: Type of screening: Colonoscopy. Completed 03/22/14. Repeat every 3 years  Lung Cancer Screening: (Low Dose CT Chest recommended if Age 73107-80years, 30 pack-year currently smoking OR have quit w/in 15years.) does not qualify.     Additional Screening:  Hepatitis C Screening: does qualify; Completed 09/01/11  Vision Screening: Recommended annual ophthalmology exams for early detection of glaucoma and other disorders of the eye. Is the patient up to date with their annual eye exam?  Yes  Who is the provider or what is the name of the office in which the patient attends annual eye exams? [facility]   Dental Screening: Recommended annual dental exams for proper oral hygiene  Community Resource Referral / Chronic Care Management: CRR  required this visit?  No   CCM required this visit?  No      Plan:     I have personally reviewed and noted the following in the patients chart:   Medical and social history Use of alcohol, tobacco or illicit drugs  Current medications and supplements including opioid prescriptions. Patient is currently taking opioid prescriptions. Information provided to patient regarding non-opioid alternatives. Patient advised to discuss non-opioid treatment plan with their provider. Functional ability and status Nutritional status Physical activity Advanced directives List of other physicians Hospitalizations, surgeries, and ER visits in previous 12 months Vitals Screenings to include cognitive, depression, and falls Referrals and appointments  In addition, I have reviewed and discussed with patient certain preventive protocols, quality metrics, and best practice recommendations. A written personalized care plan for preventive services as well as general preventive health recommendations were provided to patient.   Due to this being a telephonic visit, the after visit summary with patients personalized plan was offered  to patient via mail or my-chart. Patient would like to access on my-chart.   Loma Messing, LPN   [id number]   Nurse Health Advisor  Nurse Notes: none

## 2021-07-05 ENCOUNTER — Ambulatory Visit (INDEPENDENT_AMBULATORY_CARE_PROVIDER_SITE_OTHER): Payer: No Typology Code available for payment source

## 2021-07-05 VITALS — Ht 65.0 in | Wt 191.0 lb

## 2021-07-05 DIAGNOSIS — Z0000 Encounter for general adult medical examination without abnormal findings: Secondary | ICD-10-CM

## 2021-07-05 NOTE — Patient Instructions (Signed)
Mr. Justin Ali , Thank you for taking time to complete your Medicare Wellness Visit. I appreciate your ongoing commitment to your health goals. Please review the following plan we discussed and let me know if I can assist you in the future.   Screening recommendations/referrals: Colonoscopy: no longer required  Recommended yearly ophthalmology/optometry visit for glaucoma screening and checkup Recommended yearly dental visit for hygiene and checkup  Vaccinations: Influenza vaccine: discuss with your PCP if you change your mind   Pneumococcal vaccine: up to date Tdap vaccine: due 04/28/20, discuss with your PCP if you change your mind   Shingles vaccine: discuss with your PCP if you change your mind   Covid-19: newest booster available at your local pharmacy   Advanced directives: copy on file   Conditions/risks identified: see problem list   Next appointment: Follow up in one year for your annual wellness visit.   Preventive Care 75 Years and Older, Male Preventive care refers to lifestyle choices and visits with your health care provider that can promote health and wellness. What does preventive care include? A yearly physical exam. This is also called an annual well check. Dental exams once or twice a year. Routine eye exams. Ask your health care provider how often you should have your eyes checked. Personal lifestyle choices, including: Daily care of your teeth and gums. Regular physical activity. Eating a healthy diet. Avoiding tobacco and drug use. Limiting alcohol use. Practicing safe sex. Taking low doses of aspirin every day. Taking vitamin and mineral supplements as recommended by your health care provider. What happens during an annual well check? The services and screenings done by your health care provider during your annual well check will depend on your age, overall health, lifestyle risk factors, and family history of disease. Counseling  Your health care provider may  ask you questions about your: Alcohol use. Tobacco use. Drug use. Emotional well-being. Home and relationship well-being. Sexual activity. Eating habits. History of falls. Memory and ability to understand (cognition). Work and work Statistician. Screening  You may have the following tests or measurements: Height, weight, and BMI. Blood pressure. Lipid and cholesterol levels. These may be checked every 5 years, or more frequently if you are over 1 years old. Skin check. Lung cancer screening. You may have this screening every year starting at age 39 if you have a 30-pack-year history of smoking and currently smoke or have quit within the past 15 years. Fecal occult blood test (FOBT) of the stool. You may have this test every year starting at age 66. Flexible sigmoidoscopy or colonoscopy. You may have a sigmoidoscopy every 5 years or a colonoscopy every 10 years starting at age 84. Prostate cancer screening. Recommendations will vary depending on your family history and other risks. Hepatitis C blood test. Hepatitis B blood test. Sexually transmitted disease (STD) testing. Diabetes screening. This is done by checking your blood sugar (glucose) after you have not eaten for a while (fasting). You may have this done every 1-3 years. Abdominal aortic aneurysm (AAA) screening. You may need this if you are a current or former smoker. Osteoporosis. You may be screened starting at age 66 if you are at high risk. Talk with your health care provider about your test results, treatment options, and if necessary, the need for more tests. Vaccines  Your health care provider may recommend certain vaccines, such as: Influenza vaccine. This is recommended every year. Tetanus, diphtheria, and acellular pertussis (Tdap, Td) vaccine. You may need a Td booster  every 10 years. Zoster vaccine. You may need this after age 64. Pneumococcal 13-valent conjugate (PCV13) vaccine. One dose is recommended after age  56. Pneumococcal polysaccharide (PPSV23) vaccine. One dose is recommended after age 44. Talk to your health care provider about which screenings and vaccines you need and how often you need them. This information is not intended to replace advice given to you by your health care provider. Make sure you discuss any questions you have with your health care provider. Document Released: 05/11/2015 Document Revised: 01/02/2016 Document Reviewed: 02/13/2015 Elsevier Interactive Patient Education  2017 [place] Prevention in the Home Falls can cause injuries. They can happen to people of all ages. There are many things you can do to make your home safe and to help prevent falls. What can I do on the outside of my home? Regularly fix the edges of walkways and driveways and fix any cracks. Remove anything that might make you trip as you walk through a door, such as a raised step or threshold. Trim any bushes or trees on the path to your home. Use bright outdoor lighting. Clear any walking paths of anything that might make someone trip, such as rocks or tools. Regularly check to see if handrails are loose or broken. Make sure that both sides of any steps have handrails. Any raised decks and porches should have guardrails on the edges. Have any leaves, snow, or ice cleared regularly. Use sand or salt on walking paths during winter. Clean up any spills in your garage right away. This includes oil or grease spills. What can I do in the bathroom? Use night lights. Install grab bars by the toilet and in the tub and shower. Do not use towel bars as grab bars. Use non-skid mats or decals in the tub or shower. If you need to sit down in the shower, use a plastic, non-slip stool. Keep the floor dry. Clean up any water that spills on the floor as soon as it happens. Remove soap buildup in the tub or shower regularly. Attach bath mats securely with double-sided non-slip rug tape. Do not have throw  rugs and other things on the floor that can make you trip. What can I do in the bedroom? Use night lights. Make sure that you have a light by your bed that is easy to reach. Do not use any sheets or blankets that are too big for your bed. They should not hang down onto the floor. Have a firm chair that has side arms. You can use this for support while you get dressed. Do not have throw rugs and other things on the floor that can make you trip. What can I do in the kitchen? Clean up any spills right away. Avoid walking on wet floors. Keep items that you use a lot in easy-to-reach places. If you need to reach something above you, use a strong step stool that has a grab bar. Keep electrical cords out of the way. Do not use floor polish or wax that makes floors slippery. If you must use wax, use non-skid floor wax. Do not have throw rugs and other things on the floor that can make you trip. What can I do with my stairs? Do not leave any items on the stairs. Make sure that there are handrails on both sides of the stairs and use them. Fix handrails that are broken or loose. Make sure that handrails are as long as the stairways. Check any carpeting to  make sure that it is firmly attached to the stairs. Fix any carpet that is loose or worn. Avoid having throw rugs at the top or bottom of the stairs. If you do have throw rugs, attach them to the floor with carpet tape. Make sure that you have a light switch at the top of the stairs and the bottom of the stairs. If you do not have them, ask someone to add them for you. What else can I do to help prevent falls? Wear shoes that: Do not have high heels. Have rubber bottoms. Are comfortable and fit you well. Are closed at the toe. Do not wear sandals. If you use a stepladder: Make sure that it is fully opened. Do not climb a closed stepladder. Make sure that both sides of the stepladder are locked into place. Ask someone to hold it for you, if  possible. Clearly mark and make sure that you can see: Any grab bars or handrails. First and last steps. Where the edge of each step is. Use tools that help you move around (mobility aids) if they are needed. These include: Canes. Walkers. Scooters. Crutches. Turn on the lights when you go into a dark area. Replace any light bulbs as soon as they burn out. Set up your furniture so you have a clear path. Avoid moving your furniture around. If any of your floors are uneven, fix them. If there are any pets around you, be aware of where they are. Review your medicines with your doctor. Some medicines can make you feel dizzy. This can increase your chance of falling. Ask your doctor what other things that you can do to help prevent falls. This information is not intended to replace advice given to you by your health care provider. Make sure you discuss any questions you have with your health care provider. Document Released: 02/08/2009 Document Revised: 09/20/2015 Document Reviewed: 05/19/2014 Elsevier Interactive Patient Education  2017 [employer].

## 2021-07-10 ENCOUNTER — Other Ambulatory Visit: Payer: Self-pay | Admitting: Family Medicine

## 2021-07-10 ENCOUNTER — Other Ambulatory Visit (INDEPENDENT_AMBULATORY_CARE_PROVIDER_SITE_OTHER): Payer: No Typology Code available for payment source

## 2021-07-10 ENCOUNTER — Other Ambulatory Visit: Payer: Self-pay

## 2021-07-10 DIAGNOSIS — E538 Deficiency of other specified B group vitamins: Secondary | ICD-10-CM

## 2021-07-10 DIAGNOSIS — N289 Disorder of kidney and ureter, unspecified: Secondary | ICD-10-CM

## 2021-07-10 DIAGNOSIS — E785 Hyperlipidemia, unspecified: Secondary | ICD-10-CM

## 2021-07-10 DIAGNOSIS — C61 Malignant neoplasm of prostate: Secondary | ICD-10-CM

## 2021-07-10 LAB — RENAL FUNCTION PANEL
Albumin: 4.1 g/dL (ref 3.5–5.2)
BUN: 16 mg/dL (ref 6–23)
CO2: 31 mEq/L (ref 19–32)
Calcium: 9.6 mg/dL (ref 8.4–10.5)
Chloride: 106 mEq/L (ref 96–112)
Creatinine, Ser: 1.28 mg/dL (ref 0.40–1.50)
GFR: 53.18 mL/min — ABNORMAL LOW (ref >60.00)
Glucose, Bld: 89 mg/dL (ref 70–99)
Phosphorus: 3.6 mg/dL (ref 2.3–4.6)
Potassium: 4.6 mEq/L (ref 3.5–5.1)
Sodium: 143 mEq/L (ref 135–145)

## 2021-07-10 LAB — LIPID PANEL
Cholesterol: 139 mg/dL (ref 0–200)
HDL: 31.4 mg/dL — ABNORMAL LOW (ref >39.00)
NonHDL: 107.58
Total CHOL/HDL Ratio: 4
Triglycerides: 219 mg/dL — ABNORMAL HIGH (ref 0.0–149.0)
VLDL: 43.8 mg/dL — ABNORMAL HIGH (ref 0.0–40.0)

## 2021-07-10 LAB — VITAMIN B12: Vitamin B-12: 227 pg/mL (ref 211–911)

## 2021-07-10 LAB — LDL CHOLESTEROL, DIRECT: Direct LDL: 86 mg/dL

## 2021-07-16 ENCOUNTER — Ambulatory Visit (INDEPENDENT_AMBULATORY_CARE_PROVIDER_SITE_OTHER): Payer: Medicare Other | Admitting: Family Medicine

## 2021-07-16 ENCOUNTER — Other Ambulatory Visit: Payer: Self-pay

## 2021-07-16 ENCOUNTER — Encounter: Payer: Self-pay | Admitting: Family Medicine

## 2021-07-16 VITALS — BP 132/70 | HR 93 | Temp 97.6°F | Ht 64.5 in | Wt 191.1 lb

## 2021-07-16 DIAGNOSIS — C7951 Secondary malignant neoplasm of bone: Secondary | ICD-10-CM | POA: Diagnosis not present

## 2021-07-16 DIAGNOSIS — C61 Malignant neoplasm of prostate: Secondary | ICD-10-CM

## 2021-07-16 DIAGNOSIS — E785 Hyperlipidemia, unspecified: Secondary | ICD-10-CM | POA: Diagnosis not present

## 2021-07-16 DIAGNOSIS — Z66 Do not resuscitate: Secondary | ICD-10-CM

## 2021-07-16 DIAGNOSIS — E669 Obesity, unspecified: Secondary | ICD-10-CM

## 2021-07-16 DIAGNOSIS — Z0000 Encounter for general adult medical examination without abnormal findings: Secondary | ICD-10-CM

## 2021-07-16 DIAGNOSIS — C799 Secondary malignant neoplasm of unspecified site: Secondary | ICD-10-CM | POA: Diagnosis not present

## 2021-07-16 DIAGNOSIS — I10 Essential (primary) hypertension: Secondary | ICD-10-CM | POA: Diagnosis not present

## 2021-07-16 DIAGNOSIS — E538 Deficiency of other specified B group vitamins: Secondary | ICD-10-CM

## 2021-07-16 DIAGNOSIS — F5104 Psychophysiologic insomnia: Secondary | ICD-10-CM

## 2021-07-16 MED ORDER — TRAZODONE HCL 50 MG PO TABS: 50.0000 mg | ORAL_TABLET | Freq: Every day | ORAL | 3 refills | Status: DC

## 2021-07-16 MED ORDER — VITAMIN B-12 1000 MCG PO TABS: 1000.0000 ug | ORAL_TABLET | Freq: Every day | ORAL | Status: DC

## 2021-07-16 NOTE — Assessment & Plan Note (Signed)
Weight remains very stable. Encouraged healthy diet and lifestyle choices.  ?

## 2021-07-16 NOTE — Assessment & Plan Note (Addendum)
Appreciate onc care, sees Q3 months, continues zytiga, xgeva, and low dose prednisone.  ?

## 2021-07-16 NOTE — Assessment & Plan Note (Signed)
Stable period on temazepam and trazodone nightly.  ?

## 2021-07-16 NOTE — Patient Instructions (Addendum)
Vitamin B12 levels were borderline low - if ok with Dr Grayland Ormond, start vitamin B12 1025mg daily over the counter.  ?Work on good water intake to help kidneys. Avoid anti inflammatories like ibuprofen or aleve.  ?You are doing well today.  ?Return as needed or in 1 year for next physical/wellness visit.  ? ?Health Maintenance After Age 80?After age 29275 you are at a higher risk for certain long-term diseases and infections as well as injuries from falls. Falls are a major cause of broken bones and head injuries in people who are older than age 29275 Getting regular preventive care can help to keep you healthy and well. Preventive care includes getting regular testing and making lifestyle changes as recommended by your health care provider. Talk with your health care provider about: ?Which screenings and tests you should have. A screening is a test that checks for a disease when you have no symptoms. ?A diet and exercise plan that is right for you. ?What should I know about screenings and tests to prevent falls? ?Screening and testing are the best ways to find a health problem early. Early diagnosis and treatment give you the best chance of managing medical conditions that are common after age 29212 Certain conditions and lifestyle choices may make you more likely to have a fall. Your health care provider may recommend: ?Regular vision checks. Poor vision and conditions such as cataracts can make you more likely to have a fall. If you wear glasses, make sure to get your prescription updated if your vision changes. ?Medicine review. Work with your health care provider to regularly review all of the medicines you are taking, including over-the-counter medicines. Ask your health care provider about any side effects that may make you more likely to have a fall. Tell your health care provider if any medicines that you take make you feel dizzy or sleepy. ?Strength and balance checks. Your health care provider may recommend  certain tests to check your strength and balance while standing, walking, or changing positions. ?Foot health exam. Foot pain and numbness, as well as not wearing proper footwear, can make you more likely to have a fall. ?Screenings, including: ?Osteoporosis screening. Osteoporosis is a condition that causes the bones to get weaker and break more easily. ?Blood pressure screening. Blood pressure changes and medicines to control blood pressure can make you feel dizzy. ?Depression screening. You may be more likely to have a fall if you have a fear of falling, feel depressed, or feel unable to do activities that you used to do. ?Alcohol use screening. Using too much alcohol can affect your balance and may make you more likely to have a fall. ?Follow these instructions at home: ?Lifestyle ?Do not drink alcohol if: ?Your health care provider tells you not to drink. ?If you drink alcohol: ?Limit how much you have to: ?0-1 drink a day for women. ?0-2 drinks a day for men. ?Know how much alcohol is in your drink. In the [place] one drink equals one 12 oz bottle of beer (355 mL), one 5 oz glass of wine (148 mL), or one 1? oz glass of hard liquor (44 mL). ?Do not use any products that contain nicotine or tobacco. These products include cigarettes, chewing tobacco, and vaping devices, such as e-cigarettes. If you need help quitting, ask your health care provider. ?Activity ? ?Follow a regular exercise program to stay fit. This will help you maintain your balance. Ask your health care provider what types of exercise are  appropriate for you. ?If you need a cane or walker, use it as recommended by your health care provider. ?Wear supportive shoes that have nonskid soles. ?Safety ? ?Remove any tripping hazards, such as rugs, cords, and clutter. ?Install safety equipment such as grab bars in bathrooms and safety rails on stairs. ?Keep rooms and walkways well-lit. ?General instructions ?Talk with your health care provider about your  risks for falling. Tell your health care provider if: ?You fall. Be sure to tell your health care provider about all falls, even ones that seem minor. ?You feel dizzy, tiredness (fatigue), or off-balance. ?Take over-the-counter and prescription medicines only as told by your health care provider. These include supplements. ?Eat a healthy diet and maintain a healthy weight. A healthy diet includes low-fat dairy products, low-fat (lean) meats, and fiber from whole grains, beans, and lots of fruits and vegetables. ?Stay current with your vaccines. ?Schedule regular health, dental, and eye exams. ?Summary ?Having a healthy lifestyle and getting preventive care can help to protect your health and wellness after age 37. ?Screening and testing are the best way to find a health problem early and help you avoid having a fall. Early diagnosis and treatment give you the best chance for managing medical conditions that are more common for people who are older than age 20. ?Falls are a major cause of broken bones and head injuries in people who are older than age 33. Take precautions to prevent a fall at home. ?Work with your health care provider to learn what changes you can make to improve your health and wellness and to prevent falls. ?This information is not intended to replace advice given to you by your health care provider. Make sure you discuss any questions you have with your health care provider. ?Document Revised: 09/03/2020 Document Reviewed: 09/03/2020 ?Elsevier Patient Education ? [place]. ? ?

## 2021-07-16 NOTE — Progress Notes (Signed)
? ? Patient ID: Justin Ali, male    DOB: 1940/08/05, 81 y.o.   MRN: [id number] ? ?This visit was conducted in person. ? ?BP 132/70   Pulse 93   Temp 97.6 ?F (36.4 ?C) (Temporal)   Ht 5' 4.5" (1.638 m)   Wt 191 lb 2 oz (86.7 kg)   SpO2 97%   BMI 32.30 kg/m?   ? ?CC: CPE ?Subjective:  ? ?HPI: ?Justin Ali is a 81 y.o. male presenting on 07/16/2021 for Annual Exam [facility] prt 2. ) ? ? ?Saw health advisor earlier this month for medicare wellness visit. Note reviewed. ? ?No results found.  ?Flowsheet Row Clinical Support from 07/05/2021 in [place] [facility] [place]  ?PHQ-2 Total Score 0  ? ?  ?  ?Fall Risk  07/05/2021 07/04/2020 06/30/2019 06/22/2018 06/16/2017  ?Falls in the past year? 0 1 1 0 Yes  ?Comment - - fell on ice - tripped over rocks and injured left knee  ?Number falls in past yr: 0 0 0 - 1  ?Injury with Fall? 0 0 0 - Yes  ?Risk for fall due to : No Fall Risks Medication side effect Medication side effect - -  ?Follow up Falls prevention discussed Falls evaluation completed;Falls prevention discussed Falls evaluation completed;Falls prevention discussed - -  ? 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 ?  ?Chronic insomnia managed with restoril '30mg'$  daily + trazodone '50mg'$  nightly.  ? ?Antihypertensives and gabapentin through [place].  ? ?Headache with emesis - evaluated at ER with table CT head and MRI brain. Nausea treated with medication with benefit.  ? ?Preventative: ?COLONOSCOPY Date: 03/2014 no residual polyp, cecal AVM, rpt 3-4 yrs [name]). Pt has decided to stop screening. No blood in stool or bowel changes. ?Metastatic prostate cancer to abd LN and bony disease followed by Dr Grayland Ormond Q3 mo on Xgeva and Zytiga/prednisone.  ?Lung cancer screening - not eligible.  ?Flu shot yearly - skipped this year ?[place] 04/2019, 05/2019, booster 03/2020 ?Td - 2012  ?Pneumovax 2012, [id number]  2015 ?Shingrix - discussed - not interested ?Advanced directives - Received advanced directives, scanned 05/2015. DNR. No prolonged life support if terminal condition. Does not want feeding tube. No HCPOA form. Wife would be HCPOA. Wants to [facility] in [facility].  ?Seat belt use discussed.  ?Sunscreen use discussed. Lots of sun exposure. No changing moles on skin.  ?Ex smoker - quit 2004. Mainly cigars.  ?Alcohol - stopped 03/2017  ?Dentist last seen 2021  ?[facility] exam through [facility]  ?Bowel - constipation better since starting to drink coffee ?Bladder - no incontinence  ? 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   ? ?Relevant past medical, surgical, family and social history reviewed and updated as indicated. Interim medical history since our last visit reviewed. ?Allergies and medications reviewed and updated. ?Outpatient Medications Prior to Visit  ?Medication Sig Dispense Refill  ? abiraterone acetate (ZYTIGA) 250 MG tablet Take 4 tablets (1,000 mg total) by mouth daily. Take on an empty stomach 1 hour before or 2 hours after a meal 120 tablet 5  ? amLODipine (NORVASC) 5 MG tablet Take 1 tablet (5 mg total) by mouth daily. 90 tablet 3  ? CALCIUM PO Take 1,200 mg by  mouth daily.    ? cholecalciferol (VITAMIN D) 1000 UNITS tablet Take 2,000 Units by mouth daily.     ? clotrimazole (LOTRIMIN) 1 % cream Apply 1 application topically 2 (two) times daily. For 2 weeks or until full resolution 30 g 0  ? gabapentin (NEURONTIN) 300 MG capsule Take 1 capsule (300 mg total) by mouth at bedtime. 90 capsule 3  ? HYDROcodone-acetaminophen (NORCO/VICODIN) 5-325 MG tablet Take 1 tablet by mouth daily. As needed. 60 tablet 0  ? Ibuprofen-diphenhydrAMINE Cit (ADVIL PM PO) Take by mouth at bedtime.    ? lisinopril (PRINIVIL,ZESTRIL) 5 MG tablet Take 1 tablet (5 mg  total) by mouth daily. 90 tablet 3  ? Multiple Vitamins-Minerals (CENTRUM SILVER 50+MEN) TABS Take by mouth daily.    ? Omega-3 Fatty Acids (FISH OIL) 1000 MG CAPS Take 2 capsules (2,000 mg total) by mouth daily.    ? potassium chloride SA (KLOR-CON) 20 MEQ tablet Take 1 tablet (20 mEq total) by mouth 2 (two) times daily. (Patient taking differently: Take 20 mEq by mouth 2 (two) times daily. Pt reports only takes once a day) 10 tablet 0  ? predniSONE (DELTASONE) 5 MG tablet Take 1 tablet (5 mg total) by mouth daily with breakfast. 30 tablet 5  ? temazepam (RESTORIL) 30 MG capsule Take 1 capsule (30 mg total) by mouth at bedtime. 30 capsule 3  ? traMADol (ULTRAM) 50 MG tablet Take 1 tablet (50 mg total) by mouth 2 (two) times daily as needed. 30 tablet 3  ? traZODone (DESYREL) 50 MG tablet Take 1 tablet (50 mg total) by mouth at bedtime. 90 tablet 3  ? ?No facility-administered medications prior to visit.  ?  ? ?Per HPI unless specifically indicated in ROS section below ?Review of Systems  ?Constitutional:  Positive for appetite change. Negative for activity change, chills, fatigue, fever and unexpected weight change.  ?HENT:  Negative for hearing loss.   ?Eyes:  Negative for visual disturbance.  ?Respiratory:  Negative for cough, chest tightness, shortness of breath and wheezing.   ?Cardiovascular:  Negative for chest pain, palpitations and leg swelling.  ?Gastrointestinal:  Positive for constipation (improved with coffee). Negative for abdominal distention, abdominal pain, blood in stool, diarrhea, nausea and vomiting.  ?Genitourinary:  Negative for difficulty urinating and hematuria.  ?Musculoskeletal:  Negative for arthralgias, myalgias and neck pain.  ?Skin:  Negative for rash.  ?Neurological:  Negative for dizziness, seizures, syncope and headaches.  ?Hematological:  Negative for adenopathy. Does not bruise/bleed easily.  ?Psychiatric/Behavioral:  Negative for dysphoric mood. The patient is not nervous/anxious.    ? ?Objective:  ?BP 132/70   Pulse 93   Temp 97.6 ?F (36.4 ?C) (Temporal)   Ht 5' 4.5" (1.638 m)   Wt 191 lb 2 oz (86.7 kg)   SpO2 97%   BMI 32.30 kg/m?   ?Wt Readings from Last 3 Encounters:  ?07/16/21 191 lb 2 oz (86.7 kg)  ?07/05/21 191 lb (86.6 kg)  ?06/25/21 191 lb 1.6 oz (86.7 kg)  ?  ?  ?Physical Exam ?Vitals and nursing note reviewed.  ?Constitutional:   ?   General: He is not in acute distress. ?   Appearance: Normal appearance. He is well-developed. He is not ill-appearing.  ?HENT:  ?   Head: Normocephalic and atraumatic.  ?   Right Ear: Hearing, tympanic membrane, ear canal and external ear normal.  ?   Left Ear: Hearing, tympanic membrane, ear canal and external ear normal.  ?Eyes:  ?  General: No scleral icterus. ?   Extraocular Movements: Extraocular movements intact.  ?   Conjunctiva/sclera: Conjunctivae normal.  ?   Pupils: Pupils are equal, round, and reactive to light.  ?Neck:  ?   Thyroid: No thyroid mass or thyromegaly.  ?   Vascular: No carotid bruit.  ?Cardiovascular:  ?   Rate and Rhythm: Normal rate and regular rhythm.  ?   Pulses: Normal pulses.     ?     Radial pulses are 2+ on the right side and 2+ on the left side.  ?   Heart sounds: Normal heart sounds. No murmur heard. ?Pulmonary:  ?   Effort: Pulmonary effort is normal. No respiratory distress.  ?   Breath sounds: Normal breath sounds. No wheezing, rhonchi or rales.  ?Abdominal:  ?   General: Bowel sounds are normal. There is no distension.  ?   Palpations: Abdomen is soft. There is no mass.  ?   Tenderness: There is no abdominal tenderness. There is no guarding or rebound.  ?   Hernia: No hernia is present.  ?Musculoskeletal:     ?   General: Normal range of motion.  ?   Cervical back: Normal range of motion and neck supple.  ?   Right lower leg: No edema.  ?   Left lower leg: No edema.  ?Lymphadenopathy:  ?   Cervical: No cervical adenopathy.  ?Skin: ?   General: Skin is warm and dry.  ?   Findings: No rash.  ?Neurological:   ?   General: No focal deficit present.  ?   Mental Status: He is alert and oriented to person, place, and time.  ?Psychiatric:     ?   Mood and Affect: Mood normal.     ?   Behavior: Behavior normal.     ?   Thought Content

## 2021-07-16 NOTE — Assessment & Plan Note (Signed)
Preventative protocols reviewed and updated unless pt declined. Discussed healthy diet and lifestyle.  

## 2021-07-16 NOTE — Assessment & Plan Note (Signed)
Low normal levels - rec he start 1039mg B12 daily, advised he check with Dr FGrayland Ormondbefore starting this.  ?

## 2021-07-16 NOTE — Assessment & Plan Note (Signed)
Chronic, stable on current regimen - gets these meds at the [place].  ?

## 2021-07-16 NOTE — Assessment & Plan Note (Signed)
Stable period off statin. Reviewed diet choices to improve triglyceride levels. He doesn't like fish.  ?The 10-year ASCVD risk score (Arnett DK, et al., 2019) is: 38% ?  Values used to calculate the score: ?    Age: 81 years ?    Sex: Male ?    Is Non-Hispanic African American: No ?    Diabetic: No ?    Tobacco smoker: Yes ?    Systolic Blood Pressure: 992 mmHg ?    Is BP treated: Yes ?    HDL Cholesterol: 31.4 mg/dL ?    Total Cholesterol: 139 mg/dL  ?

## 2021-07-17 ENCOUNTER — Telehealth: Payer: Self-pay | Admitting: Family Medicine

## 2021-07-17 NOTE — Telephone Encounter (Signed)
Spoke with pt's wife, Justin Ali (on dpr) notifying him the potassium is prescribed by oncology.  They could either contact them or contact [facility] requesting the refill.  She verbalizes understanding and will inform pt.  ?

## 2021-07-17 NOTE — Telephone Encounter (Signed)
?  Encourage patient to contact the pharmacy for refills or they can request refills through [facility] ? ?LAST APPOINTMENT DATE:  Please schedule appointment if longer than 1 year ? ?NEXT APPOINTMENT DATE: ? ?MEDICATION:potassium chloride SA (KLOR-CON) 20 MEQ tablet ? ?Is the patient out of medication?  ? ?[place], [place] - [address] ? ?Let patient know to contact pharmacy at the end of the day to make sure medication is ready. ? ?Please notify patient to allow 48-72 hours to process ? ?CLINICAL FILLS OUT ALL BELOW:  ? ?LAST REFILL: ? ?QTY: ? ?REFILL DATE: ? ? ? ?OTHER COMMENTS:  ? ? ?Okay for refill? ? ?Please advise ? ? ?  ?

## 2021-07-18 NOTE — Telephone Encounter (Signed)
Noted  

## 2021-07-18 NOTE — Addendum Note (Signed)
Addended by: Darl Pikes on: 07/18/2021 10:41 AM ? ? Modules accepted: Orders ? ?

## 2021-07-18 NOTE — Telephone Encounter (Signed)
Received call from patient asking about potassium prescription. Potassium from oncology was a one time 5 days course in 08/2020. Patient has been off of potassium since then and when last checked on 06/25/21 K 4.48mol/L. We will continue to monitor, but further potassium is not needed at this time.  ? ?ADarl Pikes PharmD, BCPS, BCOP, CPP ?Hematology/Oncology Clinical Pharmacist Practitioner ?[place]/[facility] ?[phone]? ?07/18/2021 10:39 AM ?

## 2021-09-02 ENCOUNTER — Other Ambulatory Visit: Payer: Self-pay | Admitting: Oncology

## 2021-09-02 DIAGNOSIS — C61 Malignant neoplasm of prostate: Secondary | ICD-10-CM

## 2021-09-03 ENCOUNTER — Telehealth: Payer: Self-pay | Admitting: Pharmacist

## 2021-09-03 ENCOUNTER — Other Ambulatory Visit: Payer: Self-pay | Admitting: Pharmacist

## 2021-09-03 DIAGNOSIS — C61 Malignant neoplasm of prostate: Secondary | ICD-10-CM

## 2021-09-03 NOTE — Telephone Encounter (Signed)
Oral Chemotherapy Pharmacist Encounter  ? ?Patient called and left a voicemail this morning, I returned his call this afternoon. He report some right sided pain over the last 2 weeks. He has had to increase his pain medication usage over the last 2 weeks. He was called to ask for an increase in his pain medication dosage. Offered patient the option to move up his current office appt. But he would like to keep it where it is now. ? ?Spoke Dr. Grayland Ormond in reference to the patient, CT was order to be completed prior to his next office appt. Patient aware of the plan and knows to call if he would like to be seen sooner. ? ? ?Darl Pikes, PharmD, BCPS, BCOP, CPP ?Hematology/Oncology Clinical Pharmacist ?[place]/[facility] ?[phone] ? ?09/03/2021 3:55 PM ? ?

## 2021-09-16 ENCOUNTER — Ambulatory Visit
Admission: RE | Admit: 2021-09-16 | Discharge: 2021-09-16 | Disposition: A | Discharge status: Home / Self Care | Payer: No Typology Code available for payment source | Source: Ambulatory Visit | Attending: Oncology | Admitting: Oncology

## 2021-09-16 DIAGNOSIS — C61 Malignant neoplasm of prostate: Secondary | ICD-10-CM | POA: Diagnosis present

## 2021-09-16 LAB — POCT I-STAT CREATININE: Creatinine, Ser: 1.3 mg/dL — ABNORMAL HIGH (ref 0.61–1.24)

## 2021-09-16 MED ORDER — IOHEXOL 300 MG/ML  SOLN
100.0000 mL | Freq: Once | INTRAMUSCULAR | Status: AC | PRN
  Administered 2021-09-16: 100 mL via INTRAVENOUS

## 2021-09-20 NOTE — Progress Notes (Unsigned)
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  INTERVAL HISTORY: Patient returns to clinic today for routine 60-monthevaluation and continuation of treatment.  He continues to tolerate his treatment with Zytiga, prednisone, and Xgeva without significant side effects. He has no neurologic complaints.  He has a good appetite and denies weight loss.  He denies any recent fevers or illnesses.  He denies any chest pain, shortness of breath, cough, or hemoptysis.  He denies any nausea, vomiting, constipation, or diarrhea. He has no urinary complaints.  Patient offers no specific complaints today.  REVIEW OF SYSTEMS:   Review of Systems  Constitutional: Negative.  Negative for fever, malaise/fatigue and weight loss.  Eyes: Negative.  Negative for blurred vision.  Respiratory: Negative.  Negative for cough and shortness of breath.   Cardiovascular: Negative.  Negative for chest pain and leg swelling.  Gastrointestinal: Negative.  Negative for abdominal pain.  Genitourinary: Negative.  Negative for frequency and urgency.  Musculoskeletal:  Positive for back pain and joint pain.  Skin: Negative.  Negative for rash.  Neurological: Negative.  Negative for dizziness, sensory change, focal weakness and weakness.  Psychiatric/Behavioral: Negative.  The patient is not nervous/anxious and does not have insomnia.    As per HPI. Otherwise, a complete review of systems is negative.  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   2 mm - non-bleeding  03/18/2013 colonoscopy    CAD (coronary artery disease), native coronary artery 2014   By CT lung (04/2013)    Cataract    HLD (hyperlipidemia)    HTN (hypertension)    Hyperglycemia    NAFLD (nonalcoholic fatty liver disease) 08/2011   by abd [place] increased LFTs   OSA (obstructive sleep apnea)    did not tolerate CPAP   Other benign neoplasm of connective and other soft tissue of unspecified site    Neurofibroma of lateral periorbital area   Peripheral neuropathy    Personal history of colonic adenomas 08/05/2012   Pneumonia 01/2013   CAP LUL   Restless legs syndrome (RLS)    Sleep apnea    no cpap    PAST SURGICAL HISTORY: Past Surgical History:  Procedure Laterality Date   COLONOSCOPY  07/2012   5 adenomas (tubular, TV, serrated), rec rpt 5 months ([name]   COLONOSCOPY  02/2013   residual adenomas, cecal AVM, rec rpt 1 yr ([name]   COLONOSCOPY  03/2014   no residual polyp, cecal AVM, rpt 3-4 yrs ([name]   ECTROPION REPAIR Bilateral 09/29/2017   Procedure: REPAIR OF ECTROPION, EXTENSIVE UPPER AND LOWER;  Surgeon: FKarle Starch MD;  Location: [facility]  Service: Ophthalmology;  Laterality: Bilateral;   ORIF ANKLE FRACTURE  11/29/00   Dr. STamala Julian  PTOSIS REPAIR Bilateral 09/29/2017   Procedure: BLEPHAROPTOSIS REPAIR RESECT EX UPPER;  Surgeon: FKarle Starch MD;  Location[facility]  Service: Ophthalmology;  Laterality: Bilateral;   RECONSTRUCTION OF EYELID Bilateral 05/31/2018   SKIN CANCER EXCISION  1997   left anterior neck    FAMILY HISTORY: Family History  Problem Relation Age of Onset   Cancer Father  liver   Alcohol abuse Father    Liver cancer Father    Cancer Mother        breast   Breast cancer Mother    Hypertension Brother        Half-brother   Coronary artery disease Paternal Aunt        CABG   Cancer Other        GM; Aunt - breast   Colon cancer Neg Hx    Rectal cancer Neg Hx    Stomach cancer Neg Hx    Esophageal  cancer Neg Hx     ADVANCED DIRECTIVES (Y/N):  N  HEALTH MAINTENANCE: Social History   Tobacco Use   Smoking status: Former    Types: Cigarettes, Cigars    Quit date: 04/28/2002    Years since quitting: 19.4   Smokeless tobacco: Never   Tobacco comments:    rare cigar  Vaping Use   Vaping Use: Never used  Substance Use Topics   Alcohol use: Not Currently   Drug use: No     Colonoscopy:  PAP:  Bone density:  Lipid panel:  No Known Allergies  Current Outpatient Medications  Medication Sig Dispense Refill   abiraterone acetate (ZYTIGA) 250 MG tablet Take 4 tablets (1,000 mg total) by mouth daily. Take on an empty stomach 1 hour before or 2 hours after a meal 120 tablet 5   amLODipine (NORVASC) 5 MG tablet Take 1 tablet (5 mg total) by mouth daily. 90 tablet 3   CALCIUM PO Take 1,200 mg by mouth daily.     cholecalciferol (VITAMIN D) 1000 UNITS tablet Take 2,000 Units by mouth daily.      clotrimazole (LOTRIMIN) 1 % cream Apply 1 application topically 2 (two) times daily. For 2 weeks or until full resolution 30 g 0   gabapentin (NEURONTIN) 300 MG capsule Take 1 capsule (300 mg total) by mouth at bedtime. 90 capsule 3   HYDROcodone-acetaminophen (NORCO/VICODIN) 5-325 MG tablet Take 1 tablet by mouth daily. As needed. 60 tablet 0   Ibuprofen-diphenhydrAMINE Cit (ADVIL PM PO) Take by mouth at bedtime.     lisinopril (PRINIVIL,ZESTRIL) 5 MG tablet Take 1 tablet (5 mg total) by mouth daily. 90 tablet 3   Multiple Vitamins-Minerals (CENTRUM SILVER 50+MEN) TABS Take by mouth daily.     Omega-3 Fatty Acids (FISH OIL) 1000 MG CAPS Take 2 capsules (2,000 mg total) by mouth daily.     predniSONE (DELTASONE) 5 MG tablet Take 1 tablet (5 mg total) by mouth daily with breakfast. 30 tablet 5   temazepam (RESTORIL) 30 MG capsule Take 1 capsule (30 mg total) by mouth at bedtime. 30 capsule 3   traMADol (ULTRAM) 50 MG tablet Take 1 tablet (50 mg total) by mouth 2 (two) times daily as needed. 30  tablet 0   traZODone (DESYREL) 50 MG tablet Take 1 tablet (50 mg total) by mouth at bedtime. 90 tablet 3   vitamin B-12 (CYANOCOBALAMIN) 1000 MCG tablet Take 1 tablet (1,000 mcg total) by mouth daily.     No current facility-administered medications for this visit.    OBJECTIVE: There were no vitals filed for this visit.    There is no height or weight on file to calculate BMI.    ECOG FS:0 - Asymptomatic  General: Well-developed, well-nourished, no acute distress. Eyes: Pink conjunctiva, anicteric sclera. HEENT: Normocephalic, moist mucous membranes. Lungs: No audible wheezing or coughing. Heart: Regular rate and rhythm. Abdomen: Soft, nontender, no obvious  distention. Musculoskeletal: No edema, cyanosis, or clubbing. Neuro: Alert, answering all questions appropriately. Cranial nerves grossly intact. Skin: No rashes or petechiae noted. Psych: Normal affect.  LAB RESULTS:  Lab Results  Component Value Date   NA 143 07/10/2021   K 4.6 07/10/2021   CL 106 07/10/2021   CO2 31 07/10/2021   GLUCOSE 89 07/10/2021   BUN 16 07/10/2021   CREATININE 1.30 (H) 09/16/2021   CALCIUM 9.6 07/10/2021   PROT 6.6 06/25/2021   ALBUMIN 4.1 07/10/2021   AST 18 06/25/2021   ALT 12 06/25/2021   ALKPHOS 55 06/25/2021   BILITOT 0.3 06/25/2021   GFRNONAA 58 (L) 06/25/2021   GFRAA >60 12/13/2019    Lab Results  Component Value Date   WBC 5.0 06/25/2021   NEUTROABS 3.5 06/25/2021   HGB 13.3 06/25/2021   HCT 38.3 (L) 06/25/2021   MCV 91.0 06/25/2021   PLT 192 06/25/2021     STUDIES: CT CHEST ABDOMEN PELVIS W CONTRAST  Result Date: 09/16/2021 CLINICAL DATA:  History of stage IV prostate cancer, follow-up assess treatment response. * Tracking Code: BO * EXAM: CT CHEST, ABDOMEN, AND PELVIS WITH CONTRAST TECHNIQUE: Multidetector CT imaging of the chest, abdomen and pelvis was performed following the standard protocol during bolus administration of intravenous contrast. RADIATION DOSE  REDUCTION: This exam was performed according to the departmental dose-optimization program which includes automated exposure control, adjustment of the mA and/or kV according to patient size and/or use of iterative reconstruction technique. CONTRAST:  112m OMNIPAQUE IOHEXOL 300 MG/ML  SOLN COMPARISON:  Multiple priors including most recent nuclear medicine bone scan January 03, 2021 and CT Sep 19, 2016 FINDINGS: CT CHEST FINDINGS Cardiovascular: Aortic atherosclerosis without thoracic aortic aneurysm. No central pulmonary embolus on this nondedicated study. Coronary artery calcifications. Normal size heart. No significant pericardial effusion/thickening. Mediastinum/Nodes: No supraclavicular adenopathy. No discrete thyroid nodule. No pathologically enlarged mediastinal, hilar or axillary lymph nodes. Esophagus is grossly unremarkable. Lungs/Pleura: No suspicious pulmonary nodules or masses. Bibasilar atelectasis versus scarring. No pleural effusion. No pneumothorax. Musculoskeletal: Extensive sclerotic osseous lesions involving the scapulae, clavicles, ribs, sternum and vertebral bodies. Axial CT ABDOMEN PELVIS FINDINGS Hepatobiliary: Calcified hepatic granulomata. No suspicious hepatic lesion. Cholelithiasis without findings of acute cholecystitis. No biliary ductal dilation. Pancreas: No pancreatic ductal dilation or evidence of acute inflammation. Spleen: No splenomegaly or focal splenic lesion. Adrenals/Urinary Tract: Bilateral adrenal glands appear normal. Benign hypodense bilateral renal cysts require no independent follow-up. No hydronephrosis. No suspicious renal mass. Urinary bladder is unremarkable for degree of distension. Stomach/Bowel: Radiopaque enteric contrast material traverses the splenic flexure. Stomach is unremarkable for degree of distension. No pathologic dilation of small or large bowel. The appendix and terminal ileum are within normal limits. Moderate volume of formed stool throughout the  colon suggestive of constipation. No evidence of acute bowel inflammation. Vascular/Lymphatic: Aortic and branch vessel atherosclerosis without abdominal aortic aneurysm. No pathologically enlarged abdominal or pelvic lymph nodes. Reproductive: Prostate gland is atrophic with dystrophic prostatic calcifications. Other: No significant abdominopelvic free fluid. Musculoskeletal: Extensive osteoblastic metastatic osseous lesions involving the spine, pelvic girdle and proximal femurs increased in comparison to CT from 2018. IMPRESSION: 1. 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 Independent comparison to nuclear medicine bone scan without corresponding CT imaging is difficult and incomplete, there is no convincing CT evidence of active/progressive disease in comparison to recent and more remote bone scans and CTs. If concern for active osteoblastic metastatic disease,  suggest evaluation with nuclear medicine bone scan. 2. No evidence of active non-osseous metastatic disease within the chest, abdomen or pelvis. 3. Cholelithiasis without findings of acute cholecystitis. 4. Moderate volume of formed stool throughout the colon suggestive of constipation. 5.  Aortic Atherosclerosis (ICD10-I70.0). Electronically Signed   By: Dahlia Bailiff M.D.   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  PLAN:    1. 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 Prostate biopsy results revealed Gleason's 8 (4+4) adenocarcinoma.  His most recent bone scan on January 15, 2017 revealed improvement of his bony metastasis.  Initially on Sep 16, 2016 patient's PSA was approximately 194.  Since August 2018 his PSA  has ranged from 0.43-0.97.  His most recent result is 0.91.  Today's result is pending. He does not require repeat imaging unless there is suspicion of progression of disease.  Continue Zytiga and prednisone as prescribed until intolerable side effects or definitive progression of disease.  Proceed with Xgeva today.  Return to clinic in 3 months with repeat laboratory work, further evaluation, and continuation of treatment. 2. Pain: Chronic and unchanged.  Patient uses Vicodin and tramadol sparingly.   3.  Renal insufficiency: Patient's creatinine is 1.26 today.  Monitor. 4.  Insomnia: Patient does not complain of this today. He was previously given a refill of trazodone and Restoril, but was instructed that further refills must be obtained from his primary care physician.  I spent a total of 30 minutes reviewing chart data, face-to-face evaluation with the patient, counseling and coordination of care as detailed above.    Patient expressed understanding and was in agreement with this plan. He also understands that He can call clinic at any time with any questions, concerns, or complaints.    Cancer Staging  Primary malignant 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 [facility]) Staging form: Prostate, AJCC 8th Edition - Clinical stage from 09/26/2016: Stage IVB (cTX, cN1, cM1c, PSA: 193) - Signed by Lloyd Huger, MD on 09/26/2016 Prostate specific antigen (PSA) range: 20 or greater   Lloyd Huger, MD   09/20/2021 10:21 AM

## 2021-09-24 ENCOUNTER — Other Ambulatory Visit: Payer: Self-pay

## 2021-09-24 ENCOUNTER — Inpatient Hospital Stay (HOSPITAL_BASED_OUTPATIENT_CLINIC_OR_DEPARTMENT_OTHER): Payer: No Typology Code available for payment source | Admitting: Oncology

## 2021-09-24 ENCOUNTER — Encounter: Payer: Self-pay | Admitting: Oncology

## 2021-09-24 ENCOUNTER — Other Ambulatory Visit: Payer: Self-pay | Admitting: Family Medicine

## 2021-09-24 ENCOUNTER — Inpatient Hospital Stay: Payer: No Typology Code available for payment source

## 2021-09-24 ENCOUNTER — Inpatient Hospital Stay: Payer: No Typology Code available for payment source | Attending: Oncology

## 2021-09-24 VITALS — BP 138/92 | HR 84 | Temp 98.7°F | Resp 20 | Wt 190.2 lb

## 2021-09-24 DIAGNOSIS — C7951 Secondary malignant neoplasm of bone: Secondary | ICD-10-CM

## 2021-09-24 DIAGNOSIS — C61 Malignant neoplasm of prostate: Secondary | ICD-10-CM

## 2021-09-24 LAB — COMPREHENSIVE METABOLIC PANEL
ALT: 9 U/L (ref 0–44)
AST: 19 U/L (ref 15–41)
Albumin: 3.8 g/dL (ref 3.5–5.0)
Alkaline Phosphatase: 55 U/L (ref 38–126)
Anion gap: 6 (ref 5–15)
BUN: 14 mg/dL (ref 8–23)
CO2: 26 mmol/L (ref 22–32)
Calcium: 8.9 mg/dL (ref 8.9–10.3)
Chloride: 110 mmol/L (ref 98–111)
Creatinine, Ser: 1.24 mg/dL (ref 0.61–1.24)
GFR, Estimated: 59 mL/min — ABNORMAL LOW (ref >60)
Glucose, Bld: 120 mg/dL — ABNORMAL HIGH (ref 70–99)
Potassium: 3.7 mmol/L (ref 3.5–5.1)
Sodium: 142 mmol/L (ref 135–145)
Total Bilirubin: 0.4 mg/dL (ref 0.3–1.2)
Total Protein: 6.6 g/dL (ref 6.5–8.1)

## 2021-09-24 LAB — CBC WITH DIFFERENTIAL/PLATELET
Abs Immature Granulocytes: 0.03 10*3/uL (ref 0.00–0.07)
Basophils Absolute: 0 10*3/uL (ref 0.0–0.1)
Basophils Relative: 0 %
Eosinophils Absolute: 0.1 10*3/uL (ref 0.0–0.5)
Eosinophils Relative: 1 %
HCT: 37.8 % — ABNORMAL LOW (ref 39.0–52.0)
Hemoglobin: 13.3 g/dL (ref 13.0–17.0)
Immature Granulocytes: 1 %
Lymphocytes Relative: 19 %
Lymphs Abs: 1.2 10*3/uL (ref 0.7–4.0)
MCH: 31.6 pg (ref 26.0–34.0)
MCHC: 35.2 g/dL (ref 30.0–36.0)
MCV: 89.8 fL (ref 80.0–100.0)
Monocytes Absolute: 0.3 10*3/uL (ref 0.1–1.0)
Monocytes Relative: 5 %
Neutro Abs: 4.7 10*3/uL (ref 1.7–7.7)
Neutrophils Relative %: 74 %
Platelets: 177 10*3/uL (ref 150–400)
RBC: 4.21 MIL/uL — ABNORMAL LOW (ref 4.22–5.81)
RDW: 13.1 % (ref 11.5–15.5)
WBC: 6.3 10*3/uL (ref 4.0–10.5)
nRBC: 0 % (ref 0.0–0.2)

## 2021-09-24 LAB — PSA: Prostatic Specific Antigen: 0.6 ng/mL (ref 0.00–4.00)

## 2021-09-24 MED ORDER — HYDROCODONE-ACETAMINOPHEN 5-325 MG PO TABS: 1.0000 | ORAL_TABLET | Freq: Every day | ORAL | 0 refills | Status: DC

## 2021-09-24 MED ORDER — DENOSUMAB 120 MG/1.7ML ~~LOC~~ SOLN
120.0000 mg | Freq: Once | SUBCUTANEOUS | Status: AC
  Administered 2021-09-24: 120 mg via SUBCUTANEOUS
  Filled 2021-09-24: qty 1.7

## 2021-09-24 NOTE — Telephone Encounter (Signed)
Name of Medication: Temazepam Name of Pharmacy: [facility] or Written Date and Quantity: 08/26/21, #30 Last Office Visit and Type: 07/16/21, CPE Next Office Visit and Type: none Last Controlled Substance Agreement Date: none Last UDS: none

## 2021-09-25 NOTE — Telephone Encounter (Signed)
ERx 

## 2021-10-23 ENCOUNTER — Other Ambulatory Visit: Payer: Self-pay | Admitting: Oncology

## 2021-10-23 DIAGNOSIS — C61 Malignant neoplasm of prostate: Secondary | ICD-10-CM

## 2021-10-28 ENCOUNTER — Other Ambulatory Visit: Payer: Self-pay | Admitting: Family Medicine

## 2021-10-28 NOTE — Telephone Encounter (Signed)
Name of Medication: Temazepam Name of Pharmacy: [facility] or Written Date and Quantity: 09/25/21, #30 Last Office Visit and Type: 07/16/21, CPE Next Office Visit and Type: none Last Controlled Substance Agreement Date: none Last UDS: none

## 2021-10-30 NOTE — Telephone Encounter (Signed)
ERx 

## 2021-11-20 ENCOUNTER — Telehealth: Payer: Self-pay | Admitting: Family Medicine

## 2021-11-20 NOTE — Telephone Encounter (Signed)
Plz remember to get good # to return calls.  Spoke with Hope wife.   Concerning behavioral changes - more aggressive, ongoing memory difficulties.  He's giving away his tools.  She thinks he may want a divorce.  Pt wants to return to [place], [place].  No active suicidality/homicidality.  Taking pain medicines to control pain.   Discussed possible brain mets - but recent MR brain 05/2021 unchanged.  I encouraged she come with him to medical appointments from now on, to let us know if any worsening symptoms.

## 2021-11-20 NOTE — Telephone Encounter (Signed)
Wife called in very concerned about husbands health. States that she think he is on his death bed.She would like for doctor G to give her a call at his earliest convenience.

## 2021-11-25 ENCOUNTER — Other Ambulatory Visit: Payer: Self-pay | Admitting: Family Medicine

## 2021-11-26 ENCOUNTER — Other Ambulatory Visit: Payer: Self-pay | Admitting: Oncology

## 2021-11-26 DIAGNOSIS — C61 Malignant neoplasm of prostate: Secondary | ICD-10-CM

## 2021-11-26 NOTE — Telephone Encounter (Signed)
Name of Medication: Temazepam Name of Pharmacy: [facility] or Written Date and Quantity: 10/30/21, #30 Last Office Visit and Type: 07/16/21, CPE Next Office Visit and Type: none Last Controlled Substance Agreement Date: none Last UDS: none

## 2021-11-27 ENCOUNTER — Encounter: Payer: Self-pay | Admitting: Oncology

## 2021-11-27 MED ORDER — HYDROCODONE-ACETAMINOPHEN 5-325 MG PO TABS: 1.0000 | ORAL_TABLET | Freq: Two times a day (BID) | ORAL | 0 refills | Status: DC | PRN

## 2021-11-27 NOTE — Telephone Encounter (Signed)
ERx 

## 2021-12-23 ENCOUNTER — Other Ambulatory Visit: Payer: Self-pay | Admitting: Family Medicine

## 2021-12-23 ENCOUNTER — Other Ambulatory Visit: Payer: Self-pay | Admitting: Pharmacist

## 2021-12-23 DIAGNOSIS — C61 Malignant neoplasm of prostate: Secondary | ICD-10-CM

## 2021-12-23 MED ORDER — ABIRATERONE ACETATE 250 MG PO TABS: 1000.0000 mg | ORAL_TABLET | Freq: Every day | ORAL | 5 refills | Status: DC

## 2021-12-23 MED ORDER — PREDNISONE 5 MG PO TABS: 5.0000 mg | ORAL_TABLET | Freq: Every day | ORAL | 5 refills | Status: DC

## 2021-12-23 NOTE — Telephone Encounter (Signed)
ERx 

## 2021-12-23 NOTE — Telephone Encounter (Signed)
Refill request Temazepam Last office visit 07/16/21 Last refill 11/27/21 #30

## 2021-12-25 NOTE — Progress Notes (Signed)
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  INTERVAL HISTORY: Patient returns to clinic today for routine 61-monthevaluation and continuation of [place].  He continues to have mild pain that is well controlled with his current narcotic regimen.  He otherwise feels well and is asymptomatic.  He continues to tolerate his treatment with Zytiga, prednisone, and Xgeva without significant side effects. He has no neurologic complaints.  He has a good appetite and denies weight loss.  He denies any recent fevers or illnesses.  He denies any chest pain, shortness of breath, cough, or hemoptysis.  He denies any nausea, vomiting, constipation, or diarrhea. He has no urinary complaints.  Patient offers no further specific complaints today.  REVIEW OF SYSTEMS:   Review of Systems  Constitutional: Negative.  Negative for fever, malaise/fatigue and weight loss.  Eyes: Negative.  Negative for blurred vision.  Respiratory: Negative.  Negative for cough and shortness of breath.   Cardiovascular: Negative.  Negative for chest pain and leg swelling.  Gastrointestinal: Negative.  Negative for abdominal pain and constipation.  Genitourinary:  Positive for flank pain. Negative for frequency and urgency.  Musculoskeletal:  Negative for back pain and joint pain.  Skin: Negative.  Negative for rash.  Neurological: Negative.  Negative for dizziness, sensory change, focal weakness and weakness.  Psychiatric/Behavioral: Negative.  The patient is not nervous/anxious and does not have insomnia.     As per  HPI. Otherwise, a complete review of systems is negative.  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   2 mm - non-bleeding 03/18/2013 colonoscopy    CAD (coronary artery disease), native coronary artery 2014   By CT lung (04/2013)    Cataract    HLD (hyperlipidemia)    HTN (hypertension)    Hyperglycemia    NAFLD (nonalcoholic fatty liver disease) 08/2011   by abd [place]FT[place]   OSA (obstructive sleep apnea)    did not tolerate CPAP   Other benign neoplasm of connective and other soft tissue of unspecified site    Neurofibroma of lateral periorbital area   Peripheral neuropathy    Personal history of colonic adenomas 08/05/2012   Pneumonia 01/2013   CAP LUL   Restless legs syndrome (RLS)    Sleep apnea    no cpap    PAST SURGICAL HISTORY: Past Surgical History:  Procedure Laterality Date   COLONOSCOPY  07/2012   5 adenomas (tubular, TV, serrated), rec rpt 5 months ([name]   COLONOSCOPY  02/2013   residual adenomas, cecal AVM, rec rpt 1 yr ([name]   COLONOSCOPY  03/2014   no residual polyp, cecal AVM, rpt 3-4 yrs ([name]   ECTROPION REPAIR Bilateral 09/29/2017   Procedure: REPAIR OF ECTROPION, EXTENSIVE UPPER AND LOWER;  Surgeon: FKarle Starch MD;  [facility]  Service: Ophthalmology;  Laterality: Bilateral;   ORIF ANKLE FRACTURE  11/29/00   Dr. STamala Julian  PTOSIS REPAIR Bilateral 09/29/2017   Procedure: BLEPHAROPTOSIS REPAIR RESECT EX UPPER;  Surgeon: FKarle Starch MD;  Location: [facility]  Service: Ophthalmology;  Laterality: Bilateral;   RECONSTRUCTION  OF EYELID Bilateral 05/31/2018   SKIN CANCER EXCISION  1997   left anterior neck    FAMILY HISTORY: Family History  Problem Relation Age of Onset   Cancer Father        liver   Alcohol abuse Father    Liver cancer Father    Cancer Mother        breast   Breast cancer Mother    Hypertension Brother        Half-brother    Coronary artery disease Paternal Aunt        CABG   Cancer Other        GM; Aunt - breast   Colon cancer Neg Hx    Rectal cancer Neg Hx    Stomach cancer Neg Hx    Esophageal cancer Neg Hx     ADVANCED DIRECTIVES (Y/N):  N  HEALTH MAINTENANCE: Social History   Tobacco Use   Smoking status: Former    Types: Cigarettes, Cigars    Quit date: 04/28/2002    Years since quitting: 19.6   Smokeless tobacco: Never   Tobacco comments:    rare cigar  Vaping Use   Vaping Use: Never used  Substance Use Topics   Alcohol use: Not Currently   Drug use: No     Colonoscopy:  PAP:  Bone density:  Lipid panel:  No Known Allergies  Current Outpatient Medications  Medication Sig Dispense Refill   abiraterone acetate (ZYTIGA) 250 MG tablet Take 4 tablets (1,000 mg total) by mouth daily. Take on an empty stomach 1 hour before or 2 hours after a meal 120 tablet 5   amLODipine (NORVASC) 5 MG tablet Take 1 tablet (5 mg total) by mouth daily. 90 tablet 3   CALCIUM PO Take 1,200 mg by mouth daily.     cholecalciferol (VITAMIN D) 1000 UNITS tablet Take 2,000 Units by mouth daily.      clotrimazole (LOTRIMIN) 1 % cream Apply 1 application topically 2 (two) times daily. For 2 weeks or until full resolution 30 g 0   gabapentin (NEURONTIN) 300 MG capsule Take 1 capsule (300 mg total) by mouth at bedtime. 90 capsule 3   Ibuprofen-diphenhydrAMINE Cit (ADVIL PM PO) Take by mouth at bedtime.     lisinopril (PRINIVIL,ZESTRIL) 5 MG tablet Take 1 tablet (5 mg total) by mouth daily. 90 tablet 3   Multiple Vitamins-Minerals (CENTRUM SILVER 50+MEN) TABS Take by mouth daily.     Omega-3 Fatty Acids (FISH OIL) 1000 MG CAPS Take 2 capsules (2,000 mg total) by mouth daily.     predniSONE (DELTASONE) 5 MG tablet Take 1 tablet (5 mg total) by mouth daily with breakfast. 30 tablet 5   temazepam (RESTORIL) 30 MG capsule Take 1 capsule (30 mg total) by mouth at bedtime. 30 capsule 0   traMADol (ULTRAM) 50 MG tablet Take  1 tablet (50 mg total) by mouth 2 (two) times daily as needed. 30 tablet 0   traZODone (DESYREL) 50 MG tablet Take 1 tablet (50 mg total) by mouth at bedtime. 90 tablet 3   vitamin B-12 (CYANOCOBALAMIN) 1000 MCG tablet Take 1 tablet (1,000 mcg total) by mouth daily.     HYDROcodone-acetaminophen (NORCO/VICODIN) 5-325 MG tablet Take 1 tablet by mouth 3 (three) times daily as needed for moderate pain. 90 tablet 0   No current facility-administered medications for this visit.    OBJECTIVE: Vitals:   12/26/21 1456  BP: 134/79  Pulse: 66  Resp: 16  Temp:  97.8 F (36.6 C)     Body mass index is 31.77 kg/m.    ECOG FS:0 - Asymptomatic  General: Well-developed, well-nourished, no acute distress. Eyes: Pink conjunctiva, anicteric sclera. HEENT: Normocephalic, moist mucous membranes. Lungs: No audible wheezing or coughing. Heart: Regular rate and rhythm. Abdomen: Soft, nontender, no obvious distention. Musculoskeletal: No edema, cyanosis, or clubbing. Neuro: Alert, answering all questions appropriately. Cranial nerves grossly intact. Skin: No rashes or petechiae noted. Psych: Normal affect.   LAB RESULTS:  Lab Results  Component Value Date   NA 138 12/26/2021   K 4.2 12/26/2021   CL 107 12/26/2021   CO2 23 12/26/2021   GLUCOSE 112 (H) 12/26/2021   BUN 16 12/26/2021   CREATININE 1.17 12/26/2021   CALCIUM 8.9 12/26/2021   PROT 6.5 12/26/2021   ALBUMIN 3.8 12/26/2021   AST 24 12/26/2021   ALT 10 12/26/2021   ALKPHOS 51 12/26/2021   BILITOT 0.9 12/26/2021   GFRNONAA >60 12/26/2021   GFRAA >60 12/13/2019    Lab Results  Component Value Date   WBC 4.8 12/26/2021   NEUTROABS 3.4 12/26/2021   HGB 12.7 (L) 12/26/2021   HCT 36.1 (L) 12/26/2021   MCV 90.3 12/26/2021   PLT 175 12/26/2021     STUDIES: No results found.  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  PLAN:    1. Stage IV prostate cancer with  bulky abdominal lymphadenopathy and widespread bony metastasis: Initial CT and bone scan results on Sep 19, 2016 reviewed independently with widespread metastatic disease. Prostate biopsy results revealed Gleason's 8 (4+4) adenocarcinoma.  His most recent bone scan on January 15, 2017 revealed improvement of his bony metastasis.  Initially on Sep 16, 2016 patient's PSA was approximately 194.  Since August 2018 his PSA has ranged from 0.43-0.97.  Today's result is 0.74.   Repeat imaging with CT scan on Sep 16, 2021 reviewed independently with extensive known metastatic osseous lesions, but no new or progressive disease.  Continue Zytiga and prednisone as prescribed until intolerable side effects or definitive progression of disease.  Proceed with Xgeva today.  Return to clinic in 3 months with repeat laboratory work, further evaluation, and continuation of treatment. 2. Pain: Chronic and unchanged.  Continue Vicodin as prescribed.  Patient was given refills today. 3.  Renal insufficiency: Resolved.   4.  Insomnia: Patient does not complain of this today.  Continue treatment and management per primary care.   Patient expressed understanding and was in agreement with this plan. He also understands that He can call clinic at any time with any questions, concerns, or complaints.    Cancer Staging  Primary malignant 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 [facility]) Staging form: Prostate, AJCC 8th Edition - Clinical stage from 09/26/2016: Stage IVB (cTX, cN1, cM1c, PSA: 193) - Signed by Lloyd Huger, MD on 09/26/2016 Prostate specific antigen (PSA) range: 20 or greater   Lloyd Huger, MD   12/29/2021 7:55 AM

## 2021-12-26 ENCOUNTER — Inpatient Hospital Stay: Payer: No Typology Code available for payment source | Attending: Oncology

## 2021-12-26 ENCOUNTER — Inpatient Hospital Stay (HOSPITAL_BASED_OUTPATIENT_CLINIC_OR_DEPARTMENT_OTHER): Payer: No Typology Code available for payment source | Admitting: Oncology

## 2021-12-26 ENCOUNTER — Encounter: Payer: Self-pay | Admitting: Oncology

## 2021-12-26 ENCOUNTER — Inpatient Hospital Stay: Payer: No Typology Code available for payment source

## 2021-12-26 VITALS — BP 134/79 | HR 66 | Temp 97.8°F | Resp 16 | Wt 188.0 lb

## 2021-12-26 DIAGNOSIS — C61 Malignant neoplasm of prostate: Secondary | ICD-10-CM | POA: Diagnosis present

## 2021-12-26 DIAGNOSIS — C7951 Secondary malignant neoplasm of bone: Secondary | ICD-10-CM

## 2021-12-26 LAB — CBC WITH DIFFERENTIAL/PLATELET
Abs Immature Granulocytes: 0.03 10*3/uL (ref 0.00–0.07)
Basophils Absolute: 0 10*3/uL (ref 0.0–0.1)
Basophils Relative: 1 %
Eosinophils Absolute: 0.1 10*3/uL (ref 0.0–0.5)
Eosinophils Relative: 2 %
HCT: 36.1 % — ABNORMAL LOW (ref 39.0–52.0)
Hemoglobin: 12.7 g/dL — ABNORMAL LOW (ref 13.0–17.0)
Immature Granulocytes: 1 %
Lymphocytes Relative: 22 %
Lymphs Abs: 1.1 10*3/uL (ref 0.7–4.0)
MCH: 31.8 pg (ref 26.0–34.0)
MCHC: 35.2 g/dL (ref 30.0–36.0)
MCV: 90.3 fL (ref 80.0–100.0)
Monocytes Absolute: 0.2 10*3/uL (ref 0.1–1.0)
Monocytes Relative: 4 %
Neutro Abs: 3.4 10*3/uL (ref 1.7–7.7)
Neutrophils Relative %: 70 %
Platelets: 175 10*3/uL (ref 150–400)
RBC: 4 MIL/uL — ABNORMAL LOW (ref 4.22–5.81)
RDW: 13.2 % (ref 11.5–15.5)
WBC: 4.8 10*3/uL (ref 4.0–10.5)
nRBC: 0 % (ref 0.0–0.2)

## 2021-12-26 LAB — COMPREHENSIVE METABOLIC PANEL
ALT: 10 U/L (ref 0–44)
AST: 24 U/L (ref 15–41)
Albumin: 3.8 g/dL (ref 3.5–5.0)
Alkaline Phosphatase: 51 U/L (ref 38–126)
Anion gap: 8 (ref 5–15)
BUN: 16 mg/dL (ref 8–23)
CO2: 23 mmol/L (ref 22–32)
Calcium: 8.9 mg/dL (ref 8.9–10.3)
Chloride: 107 mmol/L (ref 98–111)
Creatinine, Ser: 1.17 mg/dL (ref 0.61–1.24)
GFR, Estimated: >60 mL/min (ref >60)
Glucose, Bld: 112 mg/dL — ABNORMAL HIGH (ref 70–99)
Potassium: 4.2 mmol/L (ref 3.5–5.1)
Sodium: 138 mmol/L (ref 135–145)
Total Bilirubin: 0.9 mg/dL (ref 0.3–1.2)
Total Protein: 6.5 g/dL (ref 6.5–8.1)

## 2021-12-26 LAB — PSA: Prostatic Specific Antigen: 0.74 ng/mL (ref 0.00–4.00)

## 2021-12-26 MED ORDER — DENOSUMAB 120 MG/1.7ML ~~LOC~~ SOLN
120.0000 mg | Freq: Once | SUBCUTANEOUS | Status: AC
  Administered 2021-12-26: 120 mg via SUBCUTANEOUS
  Filled 2021-12-26: qty 1.7

## 2021-12-27 ENCOUNTER — Other Ambulatory Visit: Payer: Self-pay | Admitting: Oncology

## 2021-12-27 ENCOUNTER — Encounter: Payer: Self-pay | Admitting: Pharmacist

## 2021-12-27 DIAGNOSIS — C61 Malignant neoplasm of prostate: Secondary | ICD-10-CM

## 2021-12-27 MED ORDER — HYDROCODONE-ACETAMINOPHEN 5-325 MG PO TABS: 1.0000 | ORAL_TABLET | Freq: Three times a day (TID) | ORAL | 0 refills | Status: DC | PRN

## 2021-12-29 ENCOUNTER — Encounter: Payer: Self-pay | Admitting: Oncology

## 2022-01-02 ENCOUNTER — Encounter
Admission: EM | Disposition: A | Discharge status: Home / Self Care | Payer: Self-pay | Source: Home / Self Care | Attending: Emergency Medicine

## 2022-01-02 ENCOUNTER — Observation Stay
Admission: EM | Admit: 2022-01-02 | Discharge: 2022-01-03 | Disposition: A | Discharge status: Home / Self Care | Payer: No Typology Code available for payment source | Attending: Internal Medicine | Admitting: Internal Medicine

## 2022-01-02 ENCOUNTER — Telehealth: Payer: Self-pay

## 2022-01-02 ENCOUNTER — Observation Stay: Payer: No Typology Code available for payment source | Admitting: Registered Nurse

## 2022-01-02 ENCOUNTER — Encounter: Payer: Self-pay | Admitting: Internal Medicine

## 2022-01-02 ENCOUNTER — Other Ambulatory Visit: Payer: Self-pay

## 2022-01-02 DIAGNOSIS — I10 Essential (primary) hypertension: Secondary | ICD-10-CM | POA: Diagnosis not present

## 2022-01-02 DIAGNOSIS — K279 Peptic ulcer, site unspecified, unspecified as acute or chronic, without hemorrhage or perforation: Secondary | ICD-10-CM | POA: Diagnosis not present

## 2022-01-02 DIAGNOSIS — I2510 Atherosclerotic heart disease of native coronary artery without angina pectoris: Secondary | ICD-10-CM | POA: Diagnosis present

## 2022-01-02 DIAGNOSIS — Z87891 Personal history of nicotine dependence: Secondary | ICD-10-CM | POA: Insufficient documentation

## 2022-01-02 DIAGNOSIS — Z85830 Personal history of malignant neoplasm of bone: Secondary | ICD-10-CM | POA: Diagnosis not present

## 2022-01-02 DIAGNOSIS — K269 Duodenal ulcer, unspecified as acute or chronic, without hemorrhage or perforation: Secondary | ICD-10-CM | POA: Insufficient documentation

## 2022-01-02 DIAGNOSIS — Z79899 Other long term (current) drug therapy: Secondary | ICD-10-CM | POA: Diagnosis not present

## 2022-01-02 DIAGNOSIS — I129 Hypertensive chronic kidney disease with stage 1 through stage 4 chronic kidney disease, or unspecified chronic kidney disease: Secondary | ICD-10-CM | POA: Diagnosis not present

## 2022-01-02 DIAGNOSIS — D62 Acute posthemorrhagic anemia: Secondary | ICD-10-CM | POA: Diagnosis not present

## 2022-01-02 DIAGNOSIS — Z8711 Personal history of peptic ulcer disease: Secondary | ICD-10-CM | POA: Insufficient documentation

## 2022-01-02 DIAGNOSIS — K3189 Other diseases of stomach and duodenum: Secondary | ICD-10-CM | POA: Diagnosis not present

## 2022-01-02 DIAGNOSIS — K264 Chronic or unspecified duodenal ulcer with hemorrhage: Secondary | ICD-10-CM | POA: Insufficient documentation

## 2022-01-02 DIAGNOSIS — N1831 Chronic kidney disease, stage 3a: Secondary | ICD-10-CM | POA: Diagnosis not present

## 2022-01-02 DIAGNOSIS — K922 Gastrointestinal hemorrhage, unspecified: Secondary | ICD-10-CM

## 2022-01-02 DIAGNOSIS — Z8546 Personal history of malignant neoplasm of prostate: Secondary | ICD-10-CM | POA: Insufficient documentation

## 2022-01-02 DIAGNOSIS — C7951 Secondary malignant neoplasm of bone: Secondary | ICD-10-CM | POA: Diagnosis present

## 2022-01-02 DIAGNOSIS — K254 Chronic or unspecified gastric ulcer with hemorrhage: Secondary | ICD-10-CM | POA: Insufficient documentation

## 2022-01-02 DIAGNOSIS — Z8719 Personal history of other diseases of the digestive system: Secondary | ICD-10-CM | POA: Diagnosis present

## 2022-01-02 DIAGNOSIS — C61 Malignant neoplasm of prostate: Secondary | ICD-10-CM | POA: Diagnosis present

## 2022-01-02 DIAGNOSIS — K921 Melena: Principal | ICD-10-CM | POA: Insufficient documentation

## 2022-01-02 DIAGNOSIS — E785 Hyperlipidemia, unspecified: Secondary | ICD-10-CM | POA: Diagnosis not present

## 2022-01-02 DIAGNOSIS — G4730 Sleep apnea, unspecified: Secondary | ICD-10-CM | POA: Diagnosis not present

## 2022-01-02 DIAGNOSIS — K625 Hemorrhage of anus and rectum: Secondary | ICD-10-CM | POA: Diagnosis present

## 2022-01-02 DIAGNOSIS — K253 Acute gastric ulcer without hemorrhage or perforation: Secondary | ICD-10-CM | POA: Diagnosis not present

## 2022-01-02 HISTORY — PX: ESOPHAGOGASTRODUODENOSCOPY (EGD) WITH PROPOFOL: SHX5813

## 2022-01-02 LAB — BASIC METABOLIC PANEL
Anion gap: 8 (ref 5–15)
BUN: 31 mg/dL — ABNORMAL HIGH (ref 8–23)
CO2: 26 mmol/L (ref 22–32)
Calcium: 9.4 mg/dL (ref 8.9–10.3)
Chloride: 112 mmol/L — ABNORMAL HIGH (ref 98–111)
Creatinine, Ser: 1.38 mg/dL — ABNORMAL HIGH (ref 0.61–1.24)
GFR, Estimated: 52 mL/min — ABNORMAL LOW (ref >60)
Glucose, Bld: 118 mg/dL — ABNORMAL HIGH (ref 70–99)
Potassium: 4.2 mmol/L (ref 3.5–5.1)
Sodium: 146 mmol/L — ABNORMAL HIGH (ref 135–145)

## 2022-01-02 LAB — CBC
HCT: 35.6 % — ABNORMAL LOW (ref 39.0–52.0)
HCT: 35.8 % — ABNORMAL LOW (ref 39.0–52.0)
HCT: 31.1 % — ABNORMAL LOW (ref 39.0–52.0)
HCT: 29.4 % — ABNORMAL LOW (ref 39.0–52.0)
Hemoglobin: 11.8 g/dL — ABNORMAL LOW (ref 13.0–17.0)
Hemoglobin: 11.5 g/dL — ABNORMAL LOW (ref 13.0–17.0)
Hemoglobin: 10.4 g/dL — ABNORMAL LOW (ref 13.0–17.0)
Hemoglobin: 9.9 g/dL — ABNORMAL LOW (ref 13.0–17.0)
MCH: 31.3 pg (ref 26.0–34.0)
MCH: 30.4 pg (ref 26.0–34.0)
MCH: 30.8 pg (ref 26.0–34.0)
MCH: 31.2 pg (ref 26.0–34.0)
MCHC: 33.1 g/dL (ref 30.0–36.0)
MCHC: 32.1 g/dL (ref 30.0–36.0)
MCHC: 33.4 g/dL (ref 30.0–36.0)
MCHC: 33.7 g/dL (ref 30.0–36.0)
MCV: 94.4 fL (ref 80.0–100.0)
MCV: 94.7 fL (ref 80.0–100.0)
MCV: 92 fL (ref 80.0–100.0)
MCV: 92.7 fL (ref 80.0–100.0)
Platelets: 199 10*3/uL (ref 150–400)
Platelets: 193 10*3/uL (ref 150–400)
Platelets: 164 10*3/uL (ref 150–400)
Platelets: 148 10*3/uL — ABNORMAL LOW (ref 150–400)
RBC: 3.77 MIL/uL — ABNORMAL LOW (ref 4.22–5.81)
RBC: 3.78 MIL/uL — ABNORMAL LOW (ref 4.22–5.81)
RBC: 3.38 MIL/uL — ABNORMAL LOW (ref 4.22–5.81)
RBC: 3.17 MIL/uL — ABNORMAL LOW (ref 4.22–5.81)
RDW: 13.9 % (ref 11.5–15.5)
RDW: 13.8 % (ref 11.5–15.5)
RDW: 13.8 % (ref 11.5–15.5)
RDW: 13.4 % (ref 11.5–15.5)
WBC: 6.6 10*3/uL (ref 4.0–10.5)
WBC: 6.1 10*3/uL (ref 4.0–10.5)
WBC: 5.2 10*3/uL (ref 4.0–10.5)
WBC: 4.8 10*3/uL (ref 4.0–10.5)
nRBC: 0 % (ref 0.0–0.2)
nRBC: 0 % (ref 0.0–0.2)
nRBC: 0 % (ref 0.0–0.2)
nRBC: 0 % (ref 0.0–0.2)

## 2022-01-02 LAB — PROTIME-INR
INR: 1.1 (ref 0.8–1.2)
Prothrombin Time: 14.1 seconds (ref 11.4–15.2)

## 2022-01-02 LAB — TYPE AND SCREEN
ABO/RH(D): O POS
Antibody Screen: NEGATIVE

## 2022-01-02 LAB — APTT: aPTT: 32 seconds (ref 24–36)

## 2022-01-02 SURGERY — ESOPHAGOGASTRODUODENOSCOPY (EGD) WITH PROPOFOL
Anesthesia: General

## 2022-01-02 MED ORDER — CALCIUM CARBONATE 1250 (500 CA) MG PO TABS
1.0000 | ORAL_TABLET | Freq: Every day | ORAL | Status: DC
  Administered 2022-01-03: 1250 mg via ORAL
  Filled 2022-01-02: qty 1

## 2022-01-02 MED ORDER — VITAMIN D 25 MCG (1000 UNIT) PO TABS
2000.0000 [IU] | ORAL_TABLET | Freq: Every day | ORAL | Status: DC
  Administered 2022-01-03: 2000 [IU] via ORAL
  Filled 2022-01-02: qty 2

## 2022-01-02 MED ORDER — PROPOFOL 500 MG/50ML IV EMUL
INTRAVENOUS | Status: DC | PRN
  Administered 2022-01-02: 150 ug/kg/min via INTRAVENOUS

## 2022-01-02 MED ORDER — OMEGA-3-ACID ETHYL ESTERS 1 G PO CAPS
1.0000 g | ORAL_CAPSULE | Freq: Every day | ORAL | Status: DC
  Administered 2022-01-03: 1 g via ORAL
  Filled 2022-01-02: qty 1

## 2022-01-02 MED ORDER — PANTOPRAZOLE INFUSION (NEW) - SIMPLE MED
8.0000 mg/h | INTRAVENOUS | Status: DC
  Administered 2022-01-02: 8 mg/h via INTRAVENOUS
  Filled 2022-01-02: qty 100

## 2022-01-02 MED ORDER — PREDNISONE 10 MG PO TABS
5.0000 mg | ORAL_TABLET | Freq: Every day | ORAL | Status: DC
  Administered 2022-01-03: 5 mg via ORAL
  Filled 2022-01-02: qty 1

## 2022-01-02 MED ORDER — PANTOPRAZOLE 80MG IVPB - SIMPLE MED
80.0000 mg | Freq: Once | INTRAVENOUS | Status: AC
Start: 2022-01-02 — End: 2022-01-02
  Administered 2022-01-02: 80 mg via INTRAVENOUS
  Filled 2022-01-02: qty 100

## 2022-01-02 MED ORDER — TEMAZEPAM 7.5 MG PO CAPS
30.0000 mg | ORAL_CAPSULE | Freq: Every day | ORAL | Status: DC
  Administered 2022-01-02: 30 mg via ORAL
  Filled 2022-01-02: qty 4

## 2022-01-02 MED ORDER — ABIRATERONE ACETATE 250 MG PO TABS: 1000.0000 mg | ORAL_TABLET | Freq: Every day | ORAL | Status: DC

## 2022-01-02 MED ORDER — AMLODIPINE BESYLATE 5 MG PO TABS
5.0000 mg | ORAL_TABLET | Freq: Every day | ORAL | Status: DC
  Administered 2022-01-03: 5 mg via ORAL
  Filled 2022-01-02: qty 1

## 2022-01-02 MED ORDER — SODIUM CHLORIDE 0.9 % IV SOLN: INTRAVENOUS | Status: DC

## 2022-01-02 MED ORDER — PROPOFOL 10 MG/ML IV BOLUS
INTRAVENOUS | Status: DC | PRN
  Administered 2022-01-02: 50 mg via INTRAVENOUS

## 2022-01-02 MED ORDER — ACETAMINOPHEN 325 MG PO TABS: 650.0000 mg | ORAL_TABLET | Freq: Four times a day (QID) | ORAL | Status: DC | PRN

## 2022-01-02 MED ORDER — ABIRATERONE ACETATE 250 MG PO TABS
1000.0000 mg | ORAL_TABLET | Freq: Every day | ORAL | Status: DC
  Administered 2022-01-03: 1000 mg via ORAL
  Filled 2022-01-02: qty 4

## 2022-01-02 MED ORDER — ONDANSETRON HCL 4 MG/2ML IJ SOLN: 4.0000 mg | Freq: Three times a day (TID) | INTRAMUSCULAR | Status: DC | PRN

## 2022-01-02 MED ORDER — CLOTRIMAZOLE 1 % EX CREA
1.0000 | TOPICAL_CREAM | Freq: Two times a day (BID) | CUTANEOUS | Status: DC
  Administered 2022-01-03: 1 via TOPICAL
  Filled 2022-01-02: qty 15

## 2022-01-02 MED ORDER — GABAPENTIN 300 MG PO CAPS
300.0000 mg | ORAL_CAPSULE | Freq: Every day | ORAL | Status: DC
  Administered 2022-01-02: 300 mg via ORAL
  Filled 2022-01-02: qty 1

## 2022-01-02 MED ORDER — PROPOFOL 1000 MG/100ML IV EMUL
INTRAVENOUS | Status: AC
  Filled 2022-01-02: qty 100

## 2022-01-02 MED ORDER — ADULT MULTIVITAMIN W/MINERALS CH
1.0000 | ORAL_TABLET | Freq: Every day | ORAL | Status: DC
  Administered 2022-01-03: 1 via ORAL
  Filled 2022-01-02: qty 1

## 2022-01-02 MED ORDER — HYDRALAZINE HCL 20 MG/ML IJ SOLN: 5.0000 mg | INTRAMUSCULAR | Status: DC | PRN

## 2022-01-02 MED ORDER — HYDROCODONE-ACETAMINOPHEN 5-325 MG PO TABS
1.0000 | ORAL_TABLET | Freq: Three times a day (TID) | ORAL | Status: DC | PRN
  Administered 2022-01-02 – 2022-01-03 (×3): 1 via ORAL
  Filled 2022-01-02 (×4): qty 1

## 2022-01-02 MED ORDER — VITAMIN B-12 1000 MCG PO TABS
1000.0000 ug | ORAL_TABLET | Freq: Every day | ORAL | Status: DC
  Administered 2022-01-03: 1000 ug via ORAL
  Filled 2022-01-02: qty 1

## 2022-01-02 MED ORDER — LIDOCAINE HCL (CARDIAC) PF 100 MG/5ML IV SOSY
PREFILLED_SYRINGE | INTRAVENOUS | Status: DC | PRN
  Administered 2022-01-02: 100 mg via INTRAVENOUS

## 2022-01-02 MED ORDER — TRAZODONE HCL 50 MG PO TABS
50.0000 mg | ORAL_TABLET | Freq: Every day | ORAL | Status: DC
  Administered 2022-01-02: 50 mg via ORAL
  Filled 2022-01-02: qty 1

## 2022-01-02 MED ORDER — PANTOPRAZOLE SODIUM 40 MG PO TBEC
40.0000 mg | DELAYED_RELEASE_TABLET | Freq: Every day | ORAL | Status: DC
  Administered 2022-01-03: 40 mg via ORAL
  Filled 2022-01-02: qty 1

## 2022-01-02 NOTE — Assessment & Plan Note (Addendum)
GI bleeding and acute blood loss anemia:  hemoglobin dropped from 12.7 --> 11.8, --> 11.5.  Patient is  taking ibuprofen-diphenhydramine at home. Consulted Dr. Allen Norris of GI.  - will place in med-surg bed obs - IVF:  10 mL/hr of NS - Start IV pantoprazole gtt --> changed to oral protonix 40 mg daily - Zofran IV for nausea - Avoid NSAIDs and SQ heparin - Maintain IV access (2 large bore IVs if possible). - Monitor closely and follow q6h cbc, transfuse as necessary, if Hgb<7.0 - LaB: INR, PTT and type screen    Addendum: EDG is performed by Dr. Allen Norris today: - Normal esophagus. - Erosive gastropathy with no stigmata of recent bleeding. - Non-bleeding gastric ulcers with pigmented material. - Non-bleeding duodenal ulcer with a flat pigmented spot (Forrest Class IIc). - No specimens collected.

## 2022-01-02 NOTE — Assessment & Plan Note (Signed)
No chest pain -Continue fish oil

## 2022-01-02 NOTE — Op Note (Signed)
[facility] Patient Name: Justin Ali Procedure Date: 01/02/2022 2:53 PM MRN: [id number] Account #: [id number] Date of Birth: [birth year] Admit Type: Inpatient Age: 81 Room: [facility] ENDO ROOM 3 Gender: Male Note Status: Finalized Instrument Name: Upper Endoscope [id number] Procedure:             Upper GI endoscopy Indications:           Melena Providers:             Lucilla Lame MD, MD Referring MD:          Ria Bush (Referring MD) Medicines:             Propofol per Anesthesia Complications:         No immediate complications. Procedure:             Pre-Anesthesia Assessment:                        - Prior to the procedure, a History and Physical was                         performed, and patient medications and allergies were                         reviewed. The patient's tolerance of previous                         anesthesia was also reviewed. The risks and benefits                         of the procedure and the sedation options and risks                         were discussed with the patient. All questions were                         answered, and informed consent was obtained. Prior                         Anticoagulants: The patient has taken no previous                         anticoagulant or antiplatelet agents. ASA Grade                         Assessment: II - A patient with mild systemic disease.                         After reviewing the risks and benefits, the patient                         was deemed in satisfactory condition to undergo the                         procedure.                        After obtaining informed consent, the endoscope was  passed under direct vision. Throughout the procedure,                         the patient's blood pressure, pulse, and oxygen                         saturations were monitored continuously. The Endoscope                         was introduced through  the mouth, and advanced to the                         second part of duodenum. The upper GI endoscopy was                         accomplished without difficulty. The patient tolerated                         the procedure well. Findings:      The examined esophagus was normal.      Multiple localized erosions with no stigmata of recent bleeding were       found in the gastric antrum.      Few non-bleeding cratered gastric ulcers with pigmented material were       found in the gastric antrum.      One non-bleeding cratered duodenal ulcer with a flat pigmented spot       (Forrest Class IIc) was found in the second portion of the duodenum. The       lesion was 15 mm in largest dimension. Impression:            - Normal esophagus.                        - Erosive gastropathy with no stigmata of recent                         bleeding.                        - Non-bleeding gastric ulcers with pigmented material.                        - Non-bleeding duodenal ulcer with a flat pigmented                         spot (Forrest Class IIc).                        - No specimens collected. Recommendation:        - Return patient to hospital ward for ongoing care.                        - Clear liquid diet.                        - Continue present medications.                        - Use a proton pump inhibitor PO daily.                        -  No aspirin, ibuprofen, naproxen, or other                         non-steroidal anti-inflammatory drugs. Procedure Code(s):     --- Professional ---                        [id number], Esophagogastroduodenoscopy, flexible,                         transoral; diagnostic, including collection of                         specimen(s) by brushing or washing, when performed                         (separate procedure) Diagnosis Code(s):     --- Professional ---                        K92.1, Melena (includes Hematochezia)                        K31.89, Other  diseases of stomach and duodenum                        K25.9, Gastric ulcer, unspecified as acute or chronic,                         without hemorrhage or perforation                        K26.9, Duodenal ulcer, unspecified as acute or                         chronic, without hemorrhage or perforation CPT copyright 2019 American Medical Association. All rights reserved. The codes documented in this report are preliminary and upon coder review may  be revised to meet current compliance requirements. Lucilla Lame MD, MD 01/02/2022 3:35:10 PM This report has been signed electronically. Number of Addenda: 0 Note Initiated On: 01/02/2022 2:53 PM Estimated Blood Loss:  Estimated blood loss: none.      [facility]

## 2022-01-02 NOTE — Assessment & Plan Note (Signed)
-  see above 

## 2022-01-02 NOTE — Transfer of Care (Signed)
Immediate Anesthesia Transfer of Care Note  Patient: Justin Ali  Procedure(s) Performed: ESOPHAGOGASTRODUODENOSCOPY (EGD) WITH PROPOFOL  Patient Location: Endoscopy Unit  Anesthesia Type:General  Level of Consciousness: drowsy and patient cooperative  Airway & Oxygen Therapy: Patient Spontanous Breathing  Post-op Assessment: Report given to RN and Post -op Vital signs reviewed and stable  Post vital signs: Reviewed and stable  Last Vitals:  Vitals Value Taken Time  BP 105/70 01/02/22 1535  Temp 36.1 C 01/02/22 1535  Pulse 90 01/02/22 1540  Resp 17 01/02/22 1540  SpO2 99 % 01/02/22 1540  Vitals shown include unvalidated device data.  Last Pain:  Vitals:   01/02/22 1535  TempSrc: Temporal  PainSc: Asleep         Complications: No notable events documented.

## 2022-01-02 NOTE — Assessment & Plan Note (Signed)
Continue fish oil 

## 2022-01-02 NOTE — H&P (Addendum)
History and Physical    Justin Ali CBJ:[id number] DOB: December 21, 1940 DOA: 01/02/2022  Referring MD/NP/PA:   PCP: Ria Bush, MD   Patient coming from:  The patient is coming from home.  At baseline, pt is independent for most of ADL.        Chief Complaint: rectal bleeding  HPI: Justin Ali is a 81 y.o. 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   Pt states that he noted some red stool when he wiped after having a bowel movement yesterday. He had one episode of large amounts of dark stool bowel movement this morning.  Denies nausea vomiting, diarrhea or abdominal pain.  No dizziness or lightheadedness.  Denies chest pain, cough, shortness breath.  No fever or chills.  No symptoms of UTI.   Data reviewed independently and ED Course: pt was found to have WBC 6.6, hemoglobin 11.8 (12.7 12/26/2021), slightly worsening renal function, temperature normal, blood pressure 124/73, heart rate 78, RR 18, oxygen saturation 97% on room air.  Patient is placed on MedSurg bed for position, Dr. Allen Norris of GI is consulted.   EKG:  Not done in ED, will get one.    Review of Systems:   General: no fevers, chills, no body weight gain, fatigue HEENT: no blurry vision, hearing changes or sore throat Respiratory: no dyspnea, coughing, wheezing CV: no chest pain, no palpitations GI: no nausea, vomiting, abdominal pain, diarrhea, constipation. Has dark stool GU: no dysuria, burning on urination, increased urinary frequency, hematuria  Ext: no leg edema Neuro: no unilateral weakness, numbness, or tingling, no vision change or hearing loss Skin: no rash, no skin tear. MSK: No muscle spasm, no deformity, no limitation of range of movement in spin Heme: No easy bruising.  Travel history: No recent long  distant travel.   Allergy: No Known Allergies  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   2 mm - non-bleeding 03/18/2013 colonoscopy    CAD (coronary artery disease), native coronary artery 2014   By CT lung (04/2013)    Cataract    HLD (hyperlipidemia)    HTN (hypertension)    Hyperglycemia    NAFLD (nonalcoholic fatty liver disease) 08/2011   by abd [place] with increased LFTs   OSA (obstructive sleep apnea)    did not tolerate CPAP   Other benign neoplasm of connective and other soft tissue of unspecified site    Neurofibroma of lateral periorbital area   Peripheral neuropathy    Personal history of colonic adenomas 08/05/2012   Pneumonia 01/2013   CAP LUL   Restless legs syndrome (RLS)    Sleep apnea    no cpap    Past Surgical History:  Procedure Laterality Date   COLONOSCOPY  07/2012   5 adenomas (tubular, TV, serrated), rec rpt 5 months [name])   COLONOSCOPY  02/2013   residual adenomas, cecal AVM, rec rpt 1 yr [name])   COLONOSCOPY  03/2014   no residual polyp, cecal AVM, rpt 3-4 yrs [name])   ECTROPION REPAIR Bilateral 09/29/2017   Procedure: REPAIR OF ECTROPION, EXTENSIVE UPPER AND LOWER;  Surgeon: Karle Starch, MD;  Location: [facility];  Service: Ophthalmology;  Laterality: Bilateral;   ORIF ANKLE FRACTURE  11/29/00   Dr. Tamala Julian   PTOSIS REPAIR Bilateral 09/29/2017   Procedure: BLEPHAROPTOSIS REPAIR RESECT  EX UPPER;  Surgeon: Karle Starch, MD;  Location: [facility];  Service: Ophthalmology;  Laterality: Bilateral;   RECONSTRUCTION OF EYELID Bilateral 05/31/2018   SKIN CANCER EXCISION  1997   left anterior neck    Social History:  reports that he quit smoking about 19 years ago. His smoking use included cigarettes and cigars. He has never used smokeless tobacco. He reports that he does not currently use alcohol. He reports that he does not use drugs.  Family History:  Family History  Problem Relation Age of  Onset   Cancer Father        liver   Alcohol abuse Father    Liver cancer Father    Cancer Mother        breast   Breast cancer Mother    Hypertension Brother        Half-brother   Coronary artery disease Paternal Aunt        CABG   Cancer Other        GM; Aunt - breast   Colon cancer Neg Hx    Rectal cancer Neg Hx    Stomach cancer Neg Hx    Esophageal cancer Neg Hx      Prior to Admission medications   Medication Sig Start Date End Date Taking? Authorizing Provider  abiraterone acetate (ZYTIGA) 250 MG tablet Take 4 tablets (1,000 mg total) by mouth daily. Take on an empty stomach 1 hour before or 2 hours after a meal 12/23/21   Darl Pikes, RPH-CPP  amLODipine (NORVASC) 5 MG tablet Take 1 tablet (5 mg total) by mouth daily. 06/18/17   Ria Bush, MD  CALCIUM PO Take 1,200 mg by mouth daily.    [provider]  cholecalciferol (VITAMIN D) 1000 UNITS tablet Take 2,000 Units by mouth daily.     [provider]  clotrimazole (LOTRIMIN) 1 % cream Apply 1 application topically 2 (two) times daily. For 2 weeks or until full resolution 07/13/20   Ria Bush, MD  gabapentin (NEURONTIN) 300 MG capsule Take 1 capsule (300 mg total) by mouth at bedtime. 06/18/17   Ria Bush, MD  HYDROcodone-acetaminophen (NORCO/VICODIN) 5-325 MG tablet Take 1 tablet by mouth 3 (three) times daily as needed for moderate pain. 12/27/21   Lloyd Huger, MD  Ibuprofen-diphenhydrAMINE Cit (ADVIL PM PO) Take by mouth at bedtime.    [provider]  lisinopril (PRINIVIL,ZESTRIL) 5 MG tablet Take 1 tablet (5 mg total) by mouth daily. 06/18/17   Ria Bush, MD  Multiple Vitamins-Minerals (CENTRUM SILVER 50+MEN) TABS Take by mouth daily.    [provider]  Omega-3 Fatty Acids (FISH OIL) 1000 MG CAPS Take 2 capsules (2,000 mg total) by mouth daily. 07/13/20   Ria Bush, MD  predniSONE (DELTASONE) 5 MG tablet Take 1 tablet (5 mg total) by mouth  daily with breakfast. 12/23/21   Darl Pikes, RPH-CPP  temazepam (RESTORIL) 30 MG capsule Take 1 capsule (30 mg total) by mouth at bedtime. 12/23/21   Ria Bush, MD  traMADol (ULTRAM) 50 MG tablet Take 1 tablet (50 mg total) by mouth 2 (two) times daily as needed. 09/05/21   Lloyd Huger, MD  traZODone (DESYREL) 50 MG tablet Take 1 tablet (50 mg total) by mouth at bedtime. 07/16/21   Ria Bush, MD  vitamin B-12 (CYANOCOBALAMIN) 1000 MCG tablet Take 1 tablet (1,000 mcg total) by mouth daily. 07/16/21   Ria Bush, MD    Physical Exam: Vitals:  01/02/22 1507 01/02/22 1535 01/02/22 1555 01/02/22 1635  BP: 131/83 105/70 129/79 126/79  Pulse: 69 96  69  Resp: '17 19  18  '$ Temp: (!) 97.1 F (36.2 C) (!) 97 F (36.1 C)  98 F (36.7 C)  TempSrc: Temporal Temporal  Oral  SpO2: 97% 96%  98%  Weight:      Height:       General: Not in acute distress HEENT:       Eyes: PERRL, EOMI, no scleral icterus.       ENT: No discharge from the ears and nose, no pharynx injection, no tonsillar enlargement.        Neck: No JVD, no bruit, no mass felt. Heme: No neck lymph node enlargement. Cardiac: S1/S2, RRR, No murmurs, No gallops or rubs. Respiratory: No rales, wheezing, rhonchi or rubs. GI: Soft, nondistended, nontender, no rebound pain, no organomegaly, BS present. GU: No hematuria Ext: No pitting leg edema bilaterally. 1+DP/PT pulse bilaterally. Musculoskeletal: No joint deformities, No joint redness or warmth, no limitation of ROM in spin. Skin: No rashes.  Neuro: Alert, oriented X3, cranial nerves II-XII grossly intact, moves all extremities normally.  Psych: Patient is not psychotic, no suicidal or hemocidal ideation.  Labs on Admission: I have personally reviewed following labs and imaging studies  CBC: Recent Labs  Lab 01/02/22 0907 01/02/22 1151 01/02/22 1704  WBC 6.6 6.1 5.2  HGB 11.8* 11.5* 10.4*  HCT 35.6* 35.8* 31.1*  MCV 94.4 94.7 92.0  PLT 199  193 924   Basic Metabolic Panel: Recent Labs  Lab 01/02/22 0907  NA 146*  K 4.2  CL 112*  CO2 26  GLUCOSE 118*  BUN 31*  CREATININE 1.38*  CALCIUM 9.4   GFR: Estimated Creatinine Clearance: 42.8 mL/min (A) (by C-G formula based on SCr of 1.38 mg/dL (H)). Liver Function Tests: No results for input(s): "AST", "ALT", "ALKPHOS", "BILITOT", "PROT", "ALBUMIN" in the last 168 hours.  No results for input(s): "LIPASE", "AMYLASE" in the last 168 hours. No results for input(s): "AMMONIA" in the last 168 hours. Coagulation Profile: Recent Labs  Lab 01/02/22 1151  INR 1.1   Cardiac Enzymes: No results for input(s): "CKTOTAL", "CKMB", "CKMBINDEX", "TROPONINI" in the last 168 hours. BNP (last 3 results) No results for input(s): "PROBNP" in the last 8760 hours. HbA1C: No results for input(s): "HGBA1C" in the last 72 hours. CBG: No results for input(s): "GLUCAP" in the last 168 hours. Lipid Profile: No results for input(s): "CHOL", "HDL", "LDLCALC", "TRIG", "CHOLHDL", "LDLDIRECT" in the last 72 hours. Thyroid Function Tests: No results for input(s): "TSH", "T4TOTAL", "FREET4", "T3FREE", "THYROIDAB" in the last 72 hours. Anemia Panel: No results for input(s): "VITAMINB12", "FOLATE", "FERRITIN", "TIBC", "IRON", "RETICCTPCT" in the last 72 hours. Urine analysis:    Component Value Date/Time   COLORURINE YELLOW 05/30/2021 [place] 05/30/2021 1059   APPEARANCEUR Clear 09/18/2016 0916   LABSPEC 1.015 05/30/2021 1059   LABSPEC 1.031 02/14/2013 1857   PHURINE 8.5 (H) 05/30/2021 1059   GLUCOSEU NEGATIVE 05/30/2021 1059   GLUCOSEU Negative 02/14/2013 1857   HGBUR TRACE (A) 05/30/2021 1059   BILIRUBINUR NEGATIVE 05/30/2021 1059   BILIRUBINUR Negative 09/18/2016 0916   BILIRUBINUR 1+ 02/14/2013 1857   KETONESUR 15 (A) 05/30/2021 1059   PROTEINUR NEGATIVE 05/30/2021 1059   UROBILINOGEN 0.2 06/13/2016 1433   NITRITE NEGATIVE 05/30/2021 1059   LEUKOCYTESUR NEGATIVE  05/30/2021 1059   LEUKOCYTESUR Negative 02/14/2013 1857   Sepsis Labs: '@LABRCNTIP'$ (procalcitonin:4,lacticidven:4) )No results found for this  or any previous visit (from the past 240 hour(s)).   Radiological Exams on Admission: No results found.    Assessment/Plan Principal Problem:   GI bleeding Active Problems:   Acute blood loss anemia   HTN (hypertension)   HLD (hyperlipidemia)   CAD (coronary artery disease), native coronary artery   Chronic kidney disease, stage 3a (HCC)   Prostate cancer metastatic to bone (HCC)   Assessment and Plan: * GI bleeding GI bleeding and acute blood loss anemia:  hemoglobin dropped from 12.7 --> 11.8, --> 11.5.  Patient is  taking ibuprofen-diphenhydramine at home. Consulted Dr. Allen Norris of GI.  - will place in med-surg bed obs - IVF:  10 mL/hr of NS - Start IV pantoprazole gtt --> changed to oral protonix 40 mg daily - Zofran IV for nausea - Avoid NSAIDs and SQ heparin - Maintain IV access (2 large bore IVs if possible). - Monitor closely and follow q6h cbc, transfuse as necessary, if Hgb<7.0 - LaB: INR, PTT and type screen    Addendum: EDG is performed by Dr. Allen Norris today: - Normal esophagus. - Erosive gastropathy with no stigmata of recent bleeding. - Non-bleeding gastric ulcers with pigmented material. - Non-bleeding duodenal ulcer with a flat pigmented spot ([place] Class IIc). - No specimens collected.     Acute blood loss anemia -see above  HTN (hypertension) - IV hydralazine as needed -Hold lisinopril due to GI bleeding and slightly worsening renal function -Continue amlodipine  HLD (hyperlipidemia) - Continue fish oil  CAD (coronary artery disease), native coronary artery No chest pain -Continue fish oil  Chronic kidney disease, stage 3a (HCC) Slightly worsened than baseline.  Recent baseline creatinine 1.17 on 11/28/2021.  His creatinine is 1.38, BUN 31, GFR 52 -Hold lisinopril -IV fluid as above -Hold  ibuprofen-diphenhydramine  Prostate cancer metastatic to bone (HCC) - Continue home Zytiga and prednisone 5 mg daily -Follow-up with oncologist, Dr. Grayland Ormond          DVT ppx: SCD  Code Status: Full code per pt  Family Communication:    Yes, patient's  wife  at bed side.   Disposition Plan:  Anticipate discharge back to previous environment  Consults called:  Dr. Allen Norris of GI  Admission status and Level of care: Med-Surg for obs     Dispo: The patient is from: Home              Anticipated d/c is to: Home              Anticipated d/c date is: 1 day              Patient currently is not medically stable to d/c.    Severity of Illness:  The appropriate patient status for this patient is OBSERVATION. Observation status is judged to be reasonable and necessary in order to provide the required intensity of service to ensure the patient's safety. The patient's presenting symptoms, physical exam findings, and initial radiographic and laboratory data in the context of their medical condition is felt to place them at decreased risk for further clinical deterioration. Furthermore, it is anticipated that the patient will be medically stable for discharge from the hospital within 2 midnights of admission.        Date of Service 01/02/2022    [facility]   If 7PM-7AM, please contact night-coverage [email or web address] 01/02/2022, 6:42 PM

## 2022-01-02 NOTE — Anesthesia Preprocedure Evaluation (Addendum)
Anesthesia Evaluation  Patient identified by MRN, date of birth, ID band Patient awake    Reviewed: Allergy & Precautions, NPO status , Patient's Chart, lab work & pertinent test results  Airway Mallampati: III  TM Distance: >3 FB Neck ROM: full    Dental  (+) Chipped, Missing   Pulmonary sleep apnea , former smoker,    Pulmonary exam normal        Cardiovascular Exercise Tolerance: Good hypertension, Pt. 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    Musculoskeletal   Abdominal Normal abdominal exam  (+)   Peds  Hematology  (+) Blood dyscrasia, anemia ,   Anesthesia Other Findings Past Medical History: 03/18/2013: Angiodysplasia of cecum     Comment:  2 mm - non-bleeding 03/18/2013 colonoscopy  2014: CAD (coronary artery disease), native coronary artery     Comment:  By CT lung (04/2013)  No date: Cataract No date: HLD (hyperlipidemia) No date: HTN (hypertension) No date: Hyperglycemia 08/2011: NAFLD (nonalcoholic fatty liver disease)     Comment:  by abd [place] with increased LFTs No date: OSA (obstructive sleep apnea)     Comment:  did not tolerate CPAP No date: Other benign neoplasm of connective and other soft tissue of  unspecified site     Comment:  Neurofibroma of lateral periorbital area No date: Peripheral neuropathy 08/05/2012: Personal history of colonic adenomas 01/2013: Pneumonia     Comment:  CAP LUL No date: Restless legs syndrome (RLS) No date: Sleep apnea     Comment:  no cpap  Past Surgical History: 07/2012: COLONOSCOPY     Comment:  5 adenomas (tubular, TV, serrated), rec rpt 5 months                [name]) 02/2013: COLONOSCOPY     Comment:  residual adenomas, cecal AVM, rec rpt 1 yr [name]) 03/2014: COLONOSCOPY     Comment:  no residual polyp, cecal AVM, rpt 3-4 yrs [name]) 09/29/2017: ECTROPION REPAIR; Bilateral     Comment:  Procedure: REPAIR OF ECTROPION, EXTENSIVE UPPER AND               LOWER;  Surgeon: Karle Starch, MD;  Location: [facility];  Service: Ophthalmology;  Laterality:               Bilateral; 11/29/00: ORIF ANKLE FRACTURE     Comment:  Dr. Tamala Julian 09/29/2017: PTOSIS REPAIR; Bilateral     Comment:  Procedure: BLEPHAROPTOSIS REPAIR RESECT EX UPPER;                Surgeon: Karle Starch, MD;  Location: [facility];  Service: Ophthalmology;  Laterality: Bilateral; 05/31/2018: RECONSTRUCTION OF EYELID; Bilateral 1997: SKIN CANCER EXCISION     Comment:  left anterior neck  BMI    Body Mass Index: 29.04 kg/m      Reproductive/Obstetrics negative OB ROS  Anesthesia Physical Anesthesia Plan  ASA: 3  Anesthesia Plan: General   Post-op Pain Management: Minimal or no pain anticipated   Induction: Intravenous  PONV Risk Score and Plan: Propofol infusion and TIVA  Airway Management Planned: Natural Airway  Additional Equipment:   Intra-op Plan:   Post-operative Plan:   Informed Consent: I have reviewed the patients History and Physical, chart, labs and discussed the procedure including the risks, benefits and alternatives for the proposed anesthesia with the patient or authorized representative who has indicated his/her understanding and acceptance.   Patient has DNR.  Suspend DNR.   Dental advisory given  Plan Discussed with: Anesthesiologist, CRNA and Surgeon  Anesthesia Plan Comments:       Anesthesia Quick Evaluation

## 2022-01-02 NOTE — Consult Note (Addendum)
Lucilla Lame, MD [facility]  [address] [place], [place] [address] Phone: [phone] Fax : [phone]  Consultation  Referring Provider:     Dr. Blaine Hamper Primary Care Physician:  Ria Bush, MD Primary Gastroenterologist:  Dr. [name] Reason for Consultation:     Melena  Date of Admission:  01/02/2022 Date of Consultation:  01/02/2022         HPI:   Justin Ali is a 81 y.o. male who has a history of having a colonoscopy many years ago with AVMs found in the cecum.  Appears those colonoscopies were done in [place] back in 2015.  The patient also had numerous polyps found at that time that were adenomatous.  Reports that he had an episode of bright red blood on the toilet paper but did not see much blood in the toilet yesterday.  This morning he woke up and his stools were dark black and that concerned him so he came to the emergency department.  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  The patient is followed by oncology.  The patient's hemoglobin 3 months ago was 13.2 with a week ago being 12.7 and he came in at 11.8 today.  He denies any abdominal pain.  The patient does take Advil PM to help him sleep.  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   2 mm - non-bleeding 03/18/2013 colonoscopy    CAD (coronary artery disease), native coronary artery 2014   By CT lung (04/2013)    Cataract    HLD (hyperlipidemia)    HTN (hypertension)    Hyperglycemia    NAFLD (nonalcoholic fatty liver disease) 08/2011   by abd [place] with increased LFTs   OSA (obstructive sleep apnea)    did not tolerate CPAP   Other benign neoplasm of connective and other soft tissue of unspecified site    Neurofibroma of lateral periorbital area   Peripheral neuropathy    Personal history of colonic adenomas 08/05/2012   Pneumonia 01/2013   CAP LUL   Restless legs  syndrome (RLS)    Sleep apnea    no cpap    Past Surgical History:  Procedure Laterality Date   COLONOSCOPY  07/2012   5 adenomas (tubular, TV, serrated), rec rpt 5 months [name])   COLONOSCOPY  02/2013   residual adenomas, cecal AVM, rec rpt 1 yr [name])   COLONOSCOPY  03/2014   no residual polyp, cecal AVM, rpt 3-4 yrs [name])   ECTROPION REPAIR Bilateral 09/29/2017   Procedure: REPAIR OF ECTROPION, EXTENSIVE UPPER AND LOWER;  Surgeon: Karle Starch, MD;  Location: [place];  Service: Ophthalmology;  Laterality: Bilateral;   ORIF ANKLE FRACTURE  11/29/00   Dr. Tamala Julian   PTOSIS REPAIR Bilateral 09/29/2017   Procedure: BLEPHAROPTOSIS REPAIR RESECT EX UPPER;  Surgeon: Karle Starch, MD;  Location: [place];  Service: Ophthalmology;  Laterality: Bilateral;   RECONSTRUCTION OF EYELID Bilateral 05/31/2018   SKIN CANCER EXCISION  1997   left anterior neck    Prior to Admission medications   Medication Sig Start Date End Date Taking? Authorizing Provider  abiraterone acetate (ZYTIGA) 250 MG tablet Take 4 tablets (1,000 mg total) by mouth daily. Take on an empty stomach 1 hour before or 2 hours after a meal 12/23/21  Yes Nuala Alpha N, RPH-CPP  amLODipine (NORVASC) 5 MG  tablet Take 1 tablet (5 mg total) by mouth daily. 06/18/17  Yes Ria Bush, MD  CALCIUM PO Take 1,200 mg by mouth daily.   Yes [provider]  cholecalciferol (VITAMIN D) 1000 UNITS tablet Take 2,000 Units by mouth daily.    Yes [provider]  clotrimazole (LOTRIMIN) 1 % cream Apply 1 application topically 2 (two) times daily. For 2 weeks or until full resolution 07/13/20  Yes Ria Bush, MD  gabapentin (NEURONTIN) 300 MG capsule Take 1 capsule (300 mg total) by mouth at bedtime. 06/18/17  Yes Ria Bush, MD  HYDROcodone-acetaminophen (NORCO/VICODIN) 5-325 MG tablet Take 1 tablet by mouth 3 (three) times daily as needed for moderate pain. 12/27/21  Yes Lloyd Huger, MD  Ibuprofen-diphenhydrAMINE Cit (ADVIL PM PO) Take by mouth at bedtime.   Yes [provider]  lisinopril (PRINIVIL,ZESTRIL) 5 MG tablet Take 1 tablet (5 mg total) by mouth daily. 06/18/17  Yes Ria Bush, MD  Multiple Vitamins-Minerals (CENTRUM SILVER 50+MEN) TABS Take by mouth daily.   Yes [provider]  Omega-3 Fatty Acids (FISH OIL) 1000 MG CAPS Take 2 capsules (2,000 mg total) by mouth daily. 07/13/20  Yes Ria Bush, MD  predniSONE (DELTASONE) 5 MG tablet Take 1 tablet (5 mg total) by mouth daily with breakfast. 12/23/21  Yes Darl Pikes, RPH-CPP  temazepam (RESTORIL) 30 MG capsule Take 1 capsule (30 mg total) by mouth at bedtime. 12/23/21  Yes Ria Bush, MD  traMADol (ULTRAM) 50 MG tablet Take 1 tablet (50 mg total) by mouth 2 (two) times daily as needed. 09/05/21  Yes Lloyd Huger, MD  traZODone (DESYREL) 50 MG tablet Take 1 tablet (50 mg total) by mouth at bedtime. 07/16/21  Yes Ria Bush, MD  vitamin B-12 (CYANOCOBALAMIN) 1000 MCG tablet Take 1 tablet (1,000 mcg total) by mouth daily. 07/16/21  Yes Ria Bush, MD    Family History  Problem Relation Age of Onset   Cancer Father        liver   Alcohol abuse Father    Liver cancer Father    Cancer Mother        breast   Breast cancer Mother    Hypertension Brother        Half-brother   Coronary artery disease Paternal Aunt        CABG   Cancer Other        GM; Aunt - breast   Colon cancer Neg Hx    Rectal cancer Neg Hx    Stomach cancer Neg Hx    Esophageal cancer Neg Hx      Social History   Tobacco Use   Smoking status: Former    Types: Cigarettes, Cigars    Quit date: 04/28/2002    Years since quitting: 19.6   Smokeless tobacco: Never   Tobacco comments:    rare cigar  Vaping Use   Vaping Use: Never used  Substance Use Topics   Alcohol use: Not Currently   Drug use: No    Allergies as of 01/02/2022   (No Known Allergies)    Review  of Systems:    All systems reviewed and negative except where noted in HPI.   Physical Exam:  Vital signs in last 24 hours: Temp:  [97.6 F (36.4 C)-98.1 F (36.7 C)] 98.1 F (36.7 C) (09/07 1455) Pulse Rate:  [67-78] 67 (09/07 1455) Resp:  [18] 18 (09/07 1455) BP: (122-124)/(58-73) 122/58 (09/07 1455) SpO2:  [97 %-98 %]  98 % (09/07 1455) Weight:  [81.6 kg] 81.6 kg (09/07 0905)   General:   Pleasant, cooperative in NAD Head:  Normocephalic and atraumatic. Eyes:   No icterus.   Conjunctiva pink. PERRLA. Ears:  Normal auditory acuity. Neck:  Supple; no masses or thyroidomegaly Lungs: Respirations even and unlabored. Lungs clear to auscultation bilaterally.   No wheezes, crackles, or rhonchi.  Heart:  Regular rate and rhythm;  Without murmur, clicks, rubs or gallops Abdomen:  Soft, nondistended, nontender. Normal bowel sounds. No appreciable masses or hepatomegaly.  No rebound or guarding.  Rectal:  Not performed. Msk:  Symmetrical without gross deformities.     Extremities:  Without edema, cyanosis or clubbing. Neurologic:  Alert and oriented x3;  grossly normal neurologically. Skin:  Intact without significant lesions or rashes. Cervical Nodes:  No significant cervical adenopathy. Psych:  Alert and cooperative. Normal affect.  LAB RESULTS: Recent Labs    01/02/22 0907 01/02/22 1151  WBC 6.6 6.1  HGB 11.8* 11.5*  HCT 35.6* 35.8*  PLT 199 193   BMET Recent Labs    01/02/22 0907  NA 146*  K 4.2  CL 112*  CO2 26  GLUCOSE 118*  BUN 31*  CREATININE 1.38*  CALCIUM 9.4   LFT No results for input(s): "PROT", "ALBUMIN", "AST", "ALT", "ALKPHOS", "BILITOT", "BILIDIR", "IBILI" in the last 72 hours. PT/INR Recent Labs    01/02/22 1151  LABPROT 14.1  INR 1.1    STUDIES: No results found.    Impression / Plan:   Assessment: Principal Problem:   GI bleeding Active Problems:   CAD (coronary artery disease), native coronary artery   HTN (hypertension)   HLD  (hyperlipidemia)   Acute blood loss anemia   Chronic kidney disease, stage 3a (HCC)   Prostate cancer metastatic to bone [facility])   Justin Ali is a 81 y.o. y/o male with with bright red blood per rectum yesterday and a history of AVMs of the colon with his last colonoscopy in 2015.  The patient had a large amount of black stools this morning.  The patient has been taking Advil PM before he goes to sleep.  Plan:  The patient's hemoglobin is stable and will be set up for an EGD for today.  The patient has been told that if the EGD does not show a source of his possible GI bleeding that he may need to undergo a colonoscopy tomorrow.  The patient is presently n.p.o. and will be brought to the endoscopy unit for a upper endoscopy today.  The patient has been explained the plan and agrees with it.  Thank you for involving me in the care of this patient.      LOS: 0 days   Lucilla Lame, MD, [facility] 01/02/2022, 3:02 PM,  Pager [phone] 7am-5pm  Check AMION for 5pm -7am coverage and on weekends   Note: This dictation was prepared with Dragon dictation along with smaller phrase technology. Any transcriptional errors that result from this process are unintentional.

## 2022-01-02 NOTE — Assessment & Plan Note (Signed)
-   IV hydralazine as needed -Hold lisinopril due to GI bleeding and slightly worsening renal function -Continue amlodipine

## 2022-01-02 NOTE — Telephone Encounter (Signed)
[facility] Night - Client TELEPHONE ADVICE RECORD [facility] Patient Name: Justin Ali Gender: Male DOB: 10/15/1940 Age: 81 Y 1 M 14 D Return Phone Number: [phone] (Primary) Address: City/ State/ Zip: [place]  [address] Client [facility] Night - Client Client Site [place] Provider AA - PHYSICIAN, NOT LISTED- MD Contact Type Call Who Is Calling Patient / Member / Family / Caregiver Call Type Triage / Clinical Relationship To Patient Self Return Phone Number [phone] (Primary) Chief Complaint Blood In Stool Reason for Call Request to Schedule Office Appointment Initial Comment Caller states he would like to schedule an appointment as he is a having a lot blood in stool. Additional Comment Caller states he wants to see doctor "J" today. Translation No Nurse Assessment Nurse: Gildardo Pounds, RN, Amy Date/Time (Eastern Time): 01/02/2022 8:00:40 AM Confirm and document reason for call. If symptomatic, describe symptoms. ---Caller states he is a having a lot blood in his stool. It started yesterday. When he wiped there was blood on the tissue. He did not eat much yesterday to see what was happening. This morning his stool is completely black & there was a lot of it. His side hurts. He takes his pain pill & it quits. Does the patient have any new or worsening symptoms? ---Yes Will a triage be completed? ---Yes Related visit to physician within the last 2 weeks? ---No Does the PT have any chronic conditions? (i.e. diabetes, asthma, this includes High risk factors for pregnancy, etc.) ---Unknown Is this a behavioral health or substance abuse call? ---No Guidelines Guideline Title Affirmed Question Affirmed Notes Nurse Date/Time Eilene Ghazi Time) Rectal Bleeding Pale skin (pallor) of new-onset or worsening Lovelace, RN, Amy 01/02/2022 8:03:54 AM Disp. Time Eilene Ghazi Time) Disposition  Final User 01/02/2022 8:05:45 AM Go to ED Now Yes Lovelace, RN, Amy PLEASE NOTE: All timestamps contained within this report are represented as [place] Standard Time. CONFIDENTIALTY NOTICE: This fax transmission is intended only for the addressee. It contains information that is legally privileged, confidential or otherwise protected from use or disclosure. If you are not the intended recipient, you are strictly prohibited from reviewing, disclosing, copying using or disseminating any of this information or taking any action in reliance on or regarding this information. If you have received this fax in error, please notify us immediately by telephone so that we can arrange for its return to [place]. Phone: [phone], Toll-Free: [phone], Fax: [phone] Page: 2 of 2 Call Id: [id number] Final Disposition 01/02/2022 8:05:45 AM Go to ED Now Yes Lovelace, RN, Amy Caller Disagree/Comply Comply Caller Understands Yes PreDisposition InappropriateToAsk Care Advice Given Per Guideline GO TO ED NOW: * You need to be seen in the Emergency Department. * Leave now. Drive carefully. ANOTHER ADULT SHOULD DRIVE: * It is better and safer if another adult drives instead of you. BRING MEDICINES: * Bring a list of your current medicines when you go to the Emergency Department (ER). * Bring the pill bottles too. This will help the doctor (or NP/PA) to make certain you are taking the right medicines and the right dose. CARE ADVICE given per Rectal Bleeding (Adult) guideline. Referrals Branch

## 2022-01-02 NOTE — Assessment & Plan Note (Signed)
Slightly worsened than baseline.  Recent baseline creatinine 1.17 on 11/28/2021.  His creatinine is 1.38, BUN 31, GFR 52 -Hold lisinopril -IV fluid as above -Hold ibuprofen-diphenhydramine

## 2022-01-02 NOTE — ED Provider Notes (Signed)
   [facility] Provider Note    Event Date/Time   First MD Initiated Contact with Patient 01/02/22 1009     (approximate)  History   Chief Complaint: Rectal Bleeding  HPI  Justin Ali is a 81 y.o. male with a past medical history of hypertension, hyperlipidemia, presents to the emergency department for rectal bleeding.  According to the patient yesterday he noted some red stool when he wiped after having a bowel movement.  This morning he had a bowel movement states it was black.  Patient actually brought a stool sample with him to the emergency department gross melena on exam.  Patient denies any nausea or vomiting.  No anticoagulation including aspirin or Plavix.  Vital signs are reassuring.  Patient denies any abdominal pain.  Physical Exam   Triage Vital Signs: ED Triage Vitals  Enc Vitals Group     BP 01/02/22 0900 124/73     Pulse Rate 01/02/22 0900 78     Resp 01/02/22 0900 18     Temp 01/02/22 0900 97.6 F (36.4 C)     Temp Source 01/02/22 0900 Oral     SpO2 01/02/22 0900 97 %     Weight 01/02/22 0901 180 lb (81.6 kg)     Height 01/02/22 0901 '5\' 6"'$  (1.676 m)     Head Circumference --      Peak Flow --      Pain Score 01/02/22 0904 2     Pain Loc --      Pain Edu? --      Excl. in [place]? --     Most recent vital signs: Vitals:   01/02/22 0900  BP: 124/73  Pulse: 78  Resp: 18  Temp: 97.6 F (36.4 C)  SpO2: 97%    General: Awake, no distress.  CV:  Good peripheral perfusion.  Regular rate and rhythm  Resp:  Normal effort.  Equal breath sounds bilaterally.  Abd:  No distention.  Soft, nontender.  No rebound or guarding.   ED Results / Procedures / Treatments   MEDICATIONS ORDERED IN ED: Medications - No data to display   IMPRESSION / MDM / [place] / ED COURSE  I reviewed the triage vital signs and the nursing notes.  Patient's presentation is most consistent with acute presentation with potential threat to life or  bodily function.  Patient presents to the emergency department for bright red blood per rectum now more dark consistent more melena today.  Concern for GI bleed not entirely clear if this is upper versus lower at this time.  Patient has not had any further bowel movement since this morning.  Patient's labs show an approximate one-point hemoglobin drop since last week.  We will start the patient on Protonix bolus and infusion as a precaution.  Chemistry is reassuring.  We will admit to the hospital service for ongoing work-up and treatment.  Patient denies any history of GI bleeds in the past does not recall when his last colonoscopy was.  FINAL CLINICAL IMPRESSION(S) / ED DIAGNOSES   GI bleed    Note:  This document was prepared using Dragon voice recognition software and may include unintentional dictation errors.   Harvest Dark, MD 01/02/22 [phone]

## 2022-01-02 NOTE — ED Triage Notes (Signed)
Pt here with blood in his stool since yesterday. Pt states dark red blood. Pt denies abd pain but states he has back pain.

## 2022-01-02 NOTE — Telephone Encounter (Addendum)
Lvm asking pt to call back.  Need update on rectal bleeding.   Just noticed pt was admitted today. Fyi to Dr. Darnell Level.

## 2022-01-02 NOTE — Telephone Encounter (Signed)
Agree with ER evaluation.  Please call later today or tomorrow for an update on rectal bleeding.

## 2022-01-02 NOTE — Assessment & Plan Note (Addendum)
-   Continue home Zytiga and prednisone 5 mg daily -Follow-up with oncologist, Dr. Grayland Ormond

## 2022-01-02 NOTE — Plan of Care (Signed)
  Problem: Education: Goal: Knowledge of General Education information will improve Description: Including pain rating scale, medication(s)/side effects and non-pharmacologic comfort measures Outcome: Progressing   Problem: Clinical Measurements: Goal: Will remain free from infection Outcome: Progressing   Problem: Clinical Measurements: Goal: Respiratory complications will improve 01/02/2022 2301 by Liliane Channel, RN Outcome: Progressing 01/02/2022 2301 by Liliane Channel, RN Outcome: Progressing   Problem: Pain Managment: Goal: General experience of comfort will improve Outcome: Progressing   Problem: Safety: Goal: Ability to remain free from injury will improve Outcome: Progressing

## 2022-01-02 NOTE — Telephone Encounter (Signed)
Unable to reach pt or his wife by phone. Reviewing chart result tab pt just got to [facility] ED. Sending note to Dr Darnell Level who is out of office, DR Damita Dunnings who is in office and Lattie Haw CMA.

## 2022-01-03 ENCOUNTER — Encounter: Payer: Self-pay | Admitting: Gastroenterology

## 2022-01-03 DIAGNOSIS — K922 Gastrointestinal hemorrhage, unspecified: Secondary | ICD-10-CM | POA: Diagnosis not present

## 2022-01-03 LAB — CBC
HCT: 29.5 % — ABNORMAL LOW (ref 39.0–52.0)
HCT: 29.9 % — ABNORMAL LOW (ref 39.0–52.0)
Hemoglobin: 10 g/dL — ABNORMAL LOW (ref 13.0–17.0)
Hemoglobin: 10 g/dL — ABNORMAL LOW (ref 13.0–17.0)
MCH: 31.3 pg (ref 26.0–34.0)
MCH: 31 pg (ref 26.0–34.0)
MCHC: 33.9 g/dL (ref 30.0–36.0)
MCHC: 33.4 g/dL (ref 30.0–36.0)
MCV: 92.5 fL (ref 80.0–100.0)
MCV: 92.6 fL (ref 80.0–100.0)
Platelets: 144 10*3/uL — ABNORMAL LOW (ref 150–400)
Platelets: 150 10*3/uL (ref 150–400)
RBC: 3.19 MIL/uL — ABNORMAL LOW (ref 4.22–5.81)
RBC: 3.23 MIL/uL — ABNORMAL LOW (ref 4.22–5.81)
RDW: 13.5 % (ref 11.5–15.5)
RDW: 13.4 % (ref 11.5–15.5)
WBC: 3.9 10*3/uL — ABNORMAL LOW (ref 4.0–10.5)
WBC: 4.6 10*3/uL (ref 4.0–10.5)
nRBC: 0 % (ref 0.0–0.2)
nRBC: 0 % (ref 0.0–0.2)

## 2022-01-03 LAB — COMPREHENSIVE METABOLIC PANEL
ALT: 8 U/L (ref 0–44)
AST: 14 U/L — ABNORMAL LOW (ref 15–41)
Albumin: 3 g/dL — ABNORMAL LOW (ref 3.5–5.0)
Alkaline Phosphatase: 44 U/L (ref 38–126)
Anion gap: 3 — ABNORMAL LOW (ref 5–15)
BUN: 18 mg/dL (ref 8–23)
CO2: 22 mmol/L (ref 22–32)
Calcium: 7.9 mg/dL — ABNORMAL LOW (ref 8.9–10.3)
Chloride: 117 mmol/L — ABNORMAL HIGH (ref 98–111)
Creatinine, Ser: 0.97 mg/dL (ref 0.61–1.24)
GFR, Estimated: >60 mL/min (ref >60)
Glucose, Bld: 95 mg/dL (ref 70–99)
Potassium: 3.6 mmol/L (ref 3.5–5.1)
Sodium: 142 mmol/L (ref 135–145)
Total Bilirubin: 0.8 mg/dL (ref 0.3–1.2)
Total Protein: 5.2 g/dL — ABNORMAL LOW (ref 6.5–8.1)

## 2022-01-03 LAB — GLUCOSE, CAPILLARY: Glucose-Capillary: 94 mg/dL (ref 70–99)

## 2022-01-03 MED ORDER — PANTOPRAZOLE SODIUM 40 MG PO TBEC
40.0000 mg | DELAYED_RELEASE_TABLET | Freq: Every morning | ORAL | 1 refills | Status: DC
Start: 2022-01-03 — End: 2022-01-29

## 2022-01-03 MED ORDER — ONDANSETRON HCL 4 MG PO TABS: 4.0000 mg | ORAL_TABLET | Freq: Every day | ORAL | 1 refills | Status: DC | PRN

## 2022-01-03 NOTE — Telephone Encounter (Signed)
Will defer to inpatient team.  Thanks.

## 2022-01-03 NOTE — Progress Notes (Signed)
Discharge instructions reviewed with patient and wife. PIV removed with no complications. Questions answered. Patient [place] home with wife

## 2022-01-03 NOTE — TOC Initial Note (Signed)
Transition of Care [facility]) - Initial/Assessment Note    Patient Details  Name: Justin Ali MRN: [id number] Date of Birth: 05-09-1940  Transition of Care [facility]) CM/SW Contact:    Beverly Sessions, RN Phone Number: 01/03/2022, 1:18 PM  Clinical Narrative:                   Transition of Care [facility]) Screening Note   Patient Details  Name: Justin Ali Date of Birth: 08/17/1940   Transition of Care [facility]) CM/SW Contact:    Beverly Sessions, RN Phone Number: 01/03/2022, 1:19 PM    Transition of Care Department [facility]) has reviewed patient and no TOC needs have been identified at this time. We will continue to monitor patient advancement through interdisciplinary progression rounds. If new patient transition needs arise, please place a TOC consult.         Patient Goals and CMS Choice        Expected Discharge Plan and Services           Expected Discharge Date: 01/03/22                                    Prior Living Arrangements/Services                       Activities of Daily Living   ADL Screening (condition at time of admission) Patient's cognitive ability adequate to safely complete daily activities?: Yes Is the patient deaf or have difficulty hearing?: No Does the patient have difficulty seeing, even when wearing glasses/contacts?: No Does the patient have difficulty concentrating, remembering, or making decisions?: No Patient able to express need for assistance with ADLs?: Yes Does the patient have difficulty dressing or bathing?: No Independently performs ADLs?: Yes (appropriate for developmental age) Does the patient have difficulty walking or climbing stairs?: No Weakness of Legs: None Weakness of Arms/Hands: None  Permission Sought/Granted                  Emotional Assessment              Admission diagnosis:  GI bleeding [K92.2] Patient Active Problem List   Diagnosis Date Noted   GI bleeding 01/02/2022    HTN (hypertension)    HLD (hyperlipidemia)    Acute blood loss anemia    Chronic kidney disease, stage 3a ([place])    Prostate cancer metastatic to bone ([place])    Melena    Acute gastric ulcer    Ulcerated, duodenum    Low serum vitamin B12 07/10/2021   Incontinence of feces 11/21/2020   Candidal balanitis 11/21/2020   Tinea cruris 07/13/2020   Choroidal lesion 08/19/2019   Combined forms of age-related cataract of both eyes 08/19/2019   DNR (do not resuscitate) 07/04/2019   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   Metastasis from malignant neoplasm of prostate ([place]) 06/24/2019   Nuclear sclerosis of both eyes 06/24/2019   Laxity of eyelid 01/16/2018   Right hip pain 01/02/2017   Chronic insomnia 01/02/2017   Primary malignant 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 ([place]) 09/24/2016   Benign prostatic hyperplasia 06/12/2015   Advanced care planning/counseling discussion 06/07/2014   Obesity, Class I, BMI 30-34.9 06/07/2014   Hearing loss due to cerumen impaction 06/07/2014   CAD (coronary artery disease), native coronary artery 06/02/2013   Angiodysplasia of cecum  03/18/2013   Personal history of colonic adenomas 08/05/2012   Medicare annual wellness visit, subsequent 06/02/2012   Health maintenance examination 06/02/2011   Fatty liver 06/02/2011   HYPERTENSION, BENIGN ESSENTIAL 11/02/2006   Dyslipidemia 10/27/2006   RESTLESS LEG SYNDROME 10/27/2006   PERIPHERAL NEUROPATHY 10/27/2006   Sleep apnea 10/27/2006   PCP:  Ria Bush, MD Pharmacy:   [place], [address] [place] [address] Phone: [phone] Fax: [place], [address] [place] [place] [address] Phone: [phone] Fax: [phone]     Social Determinants of Health (SDOH) Interventions    Readmission Risk Interventions     No data to display

## 2022-01-03 NOTE — Anesthesia Postprocedure Evaluation (Signed)
Anesthesia Post Note  Patient: Justin Ali  Procedure(s) Performed: ESOPHAGOGASTRODUODENOSCOPY (EGD) WITH PROPOFOL  Patient location during evaluation: PACU Anesthesia Type: General Level of consciousness: awake and alert Pain management: pain level controlled Vital Signs Assessment: post-procedure vital signs reviewed and stable Respiratory status: spontaneous breathing, nonlabored ventilation and respiratory function stable Cardiovascular status: blood pressure returned to baseline and stable Postop Assessment: no apparent nausea or vomiting Anesthetic complications: no   No notable events documented.   Last Vitals:  Vitals:   01/03/22 0340 01/03/22 0757  BP: 130/69 121/72  Pulse: 62 66  Resp: 16 17  Temp: 36.9 C 37.1 C  SpO2: 98% 97%    Last Pain:  Vitals:   01/03/22 0853  TempSrc:   PainSc: [place]

## 2022-01-03 NOTE — Discharge Summary (Signed)
Physician Discharge Summary  Justin Ali EXH:[id number] DOB: 12/28/1940 DOA: 01/02/2022  PCP: Ria Bush, MD  Admit date: 01/02/2022 Discharge date: 01/03/2022  Admitted From: Home Disposition: Home  Recommendations for Outpatient Follow-up:  Follow up with PCP in 1-2 weeks Upper GI 3 to 4 weeks  Home Health: No Equipment/Devices: None  Discharge Condition: Stable CODE STATUS: Full Diet recommendation: Soft  Brief/Interim Summary: 81 y.o. 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    Pt states that he noted some red stool when he wiped after having a bowel movement yesterday. He had one episode of large amounts of dark stool bowel movement this morning.    Ulcer with clean base noted on EGD.  Hemoglobin stable on day of discharge.  Cleared for discharge by GI.  Defer further endoscopic evaluation outpatient.  At time of discharge patient educated on avoidance of NSAIDs.  Will initiate daily p.o. PPI.  Stable for DC home at this time.  Follow-up outpatient PCP and GI.    Discharge Diagnoses:  Principal Problem:   GI bleeding Active Problems:   Acute blood loss anemia   HTN (hypertension)   HLD (hyperlipidemia)   CAD (coronary artery disease), native coronary artery   Chronic kidney disease, stage 3a (HCC)   Prostate cancer metastatic to bone (HCC)  GI bleed Blood loss anemia Patient did not have a significant drop in hemoglobin but did drop from baseline.  Has been taking NSAIDs.  GI consulted on admission.  Patient underwent EGD on 9/7.  Nonbleeding gastric ulcers and duodenal ulcers.  No specimens collected.  Hemoglobin on day of discharge stable.  Cleared for discharge.  Educated to avoid NSAIDs.  We will start daily p.o. PPI.  Follow-up outpatient PCP and  GI.  Discharge Instructions  Discharge Instructions     Diet - low sodium heart healthy   Complete by: As directed    Increase activity slowly   Complete by: As directed       Allergies as of 01/03/2022   No Known Allergies      Medication List     STOP taking these medications    ADVIL PM PO       TAKE these medications    abiraterone acetate 250 MG tablet Commonly known as: ZYTIGA Take 4 tablets (1,000 mg total) by mouth daily. Take on an empty stomach 1 hour before or 2 hours after a meal   amLODipine 5 MG tablet Commonly known as: NORVASC Take 1 tablet (5 mg total) by mouth daily.   CALCIUM PO Take 1,200 mg by mouth daily.   Centrum Silver 50+Men Tabs Take by mouth daily.   cholecalciferol 1000 units tablet Commonly known as: VITAMIN D Take 2,000 Units by mouth daily.   clotrimazole 1 % cream Commonly known as: LOTRIMIN Apply 1 application topically 2 (two) times daily. For 2 weeks or until full resolution   cyanocobalamin 1000 MCG tablet Commonly known as: VITAMIN B12 Take 1 tablet (1,000 mcg total) by mouth daily.   Fish Oil 1000 MG Caps Take 2 capsules (2,000 mg total) by mouth daily.   gabapentin 300 MG capsule Commonly known as: NEURONTIN Take 1 capsule (300 mg total) by mouth at bedtime.   HYDROcodone-acetaminophen 5-325 MG tablet Commonly known as: NORCO/VICODIN Take 1 tablet by mouth 3 (three) times daily as needed for moderate  pain.   lisinopril 5 MG tablet Commonly known as: ZESTRIL Take 1 tablet (5 mg total) by mouth daily.   ondansetron 4 MG tablet Commonly known as: Zofran Take 1 tablet (4 mg total) by mouth daily as needed for nausea or vomiting.   pantoprazole 40 MG tablet Commonly known as: PROTONIX Take 1 tablet (40 mg total) by mouth every morning.   predniSONE 5 MG tablet Commonly known as: DELTASONE Take 1 tablet (5 mg total) by mouth daily with breakfast.   temazepam 30 MG capsule Commonly known as:  RESTORIL Take 1 capsule (30 mg total) by mouth at bedtime.   traMADol 50 MG tablet Commonly known as: ULTRAM Take 1 tablet (50 mg total) by mouth 2 (two) times daily as needed.   traZODone 50 MG tablet Commonly known as: DESYREL Take 1 tablet (50 mg total) by mouth at bedtime.        Follow-up Information     Ria Bush, MD. Schedule an appointment as soon as possible for a visit in 1 week(s).   Specialty: Family Medicine Contact information: [place] [address] [phone]         Lucilla Lame, MD. Schedule an appointment as soon as possible for a visit in 4 week(s).   Specialty: Gastroenterology Why: Follow up in 4 weeks Contact information: [address] [place] [address] [phone]                No Known Allergies  Consultations: GI   Procedures/Studies: No results found.    Subjective: Seen and examined the day of discharge.  Stable no distress.  Hemoglobin stable.  No further bleeding.  Stable for discharge home.  Discharge Exam: Vitals:   01/03/22 0340 01/03/22 0757  BP: 130/69 121/72  Pulse: 62 66  Resp: 16 17  Temp: 98.5 F (36.9 C) 98.8 F (37.1 C)  SpO2: 98% 97%   Vitals:   01/02/22 1635 01/02/22 1927 01/03/22 0340 01/03/22 0757  BP: 126/79 112/76 130/69 121/72  Pulse: 69 74 62 66  Resp: '18 20 16 17  '$ Temp: 98 F (36.7 C) 98.2 F (36.8 C) 98.5 F (36.9 C) 98.8 F (37.1 C)  TempSrc: Oral Oral Oral Oral  SpO2: 98% 97% 98% 97%  Weight:      Height:        General: Pt is alert, awake, not in acute distress Cardiovascular: RRR, S1/S2 +, no rubs, no gallops Respiratory: CTA bilaterally, no wheezing, no rhonchi Abdominal: Soft, NT, ND, bowel sounds + Extremities: no edema, no cyanosis    The results of significant diagnostics from this hospitalization (including imaging, microbiology, ancillary and laboratory) are listed below for reference.     Microbiology: No results found  for this or any previous visit (from the past 240 hour(s)).   Labs: BNP (last 3 results) No results for input(s): "BNP" in the last 8760 hours. Basic Metabolic Panel: Recent Labs  Lab 01/02/22 0907 01/03/22 0524  NA 146* 142  K 4.2 3.6  CL 112* 117*  CO2 26 22  GLUCOSE 118* 95  BUN 31* 18  CREATININE 1.38* 0.97  CALCIUM 9.4 7.9*   Liver Function Tests: Recent Labs  Lab 01/03/22 0524  AST 14*  ALT 8  ALKPHOS 44  BILITOT 0.8  PROT 5.2*  ALBUMIN 3.0*   No results for input(s): "LIPASE", "AMYLASE" in the last 168 hours. No results for input(s): "AMMONIA" in the last 168 hours. CBC: Recent Labs  Lab 01/02/22 1151 01/02/22 1704 01/02/22 2245 01/03/22 0524 01/03/22 1045  WBC 6.1 5.2 4.8 3.9* 4.6  HGB 11.5* 10.4* 9.9* 10.0* 10.0*  HCT 35.8* 31.1* 29.4* 29.5* 29.9*  MCV 94.7 92.0 92.7 92.5 92.6  PLT 193 164 148* 144* 150   Cardiac Enzymes: No results for input(s): "CKTOTAL", "CKMB", "CKMBINDEX", "TROPONINI" in the last 168 hours. BNP: Invalid input(s): "POCBNP" CBG: Recent Labs  Lab 01/03/22 0758  GLUCAP 94   D-Dimer No results for input(s): "DDIMER" in the last 72 hours. Hgb A1c No results for input(s): "HGBA1C" in the last 72 hours. Lipid Profile No results for input(s): "CHOL", "HDL", "LDLCALC", "TRIG", "CHOLHDL", "LDLDIRECT" in the last 72 hours. Thyroid function studies No results for input(s): "TSH", "T4TOTAL", "T3FREE", "THYROIDAB" in the last 72 hours.  Invalid input(s): "FREET3" Anemia work up No results for input(s): "VITAMINB12", "FOLATE", "FERRITIN", "TIBC", "IRON", "RETICCTPCT" in the last 72 hours. Urinalysis    Component Value Date/Time   COLORURINE YELLOW 05/30/2021 1059   APPEARANCEUR CLEAR 05/30/2021 1059   APPEARANCEUR Clear 09/18/2016 0916   LABSPEC 1.015 05/30/2021 1059   LABSPEC 1.031 02/14/2013 1857   PHURINE 8.5 (H) 05/30/2021 1059   GLUCOSEU NEGATIVE 05/30/2021 1059   GLUCOSEU Negative 02/14/2013 1857   HGBUR TRACE (A)  05/30/2021 1059   BILIRUBINUR NEGATIVE 05/30/2021 1059   BILIRUBINUR Negative 09/18/2016 0916   BILIRUBINUR 1+ 02/14/2013 1857   KETONESUR 15 (A) 05/30/2021 1059   PROTEINUR NEGATIVE 05/30/2021 1059   UROBILINOGEN 0.2 06/13/2016 1433   NITRITE NEGATIVE 05/30/2021 1059   LEUKOCYTESUR NEGATIVE 05/30/2021 1059   LEUKOCYTESUR Negative 02/14/2013 1857   Sepsis Labs Recent Labs  Lab 01/02/22 1704 01/02/22 2245 01/03/22 0524 01/03/22 1045  WBC 5.2 4.8 3.9* 4.6   Microbiology No results found for this or any previous visit (from the past 240 hour(s)).   Time coordinating discharge: Over 30 minutes  SIGNED:   Sidney Ace, MD  [facility] 01/03/2022, 1:00 PM Pager   If 7PM-7AM, please contact night-coverage

## 2022-01-06 ENCOUNTER — Telehealth: Payer: Self-pay | Admitting: Family Medicine

## 2022-01-06 NOTE — Telephone Encounter (Signed)
Is pt backed up with stool or still having blood in stool?  If blood in stool, pt needs to be triaged.

## 2022-01-06 NOTE — Telephone Encounter (Signed)
Pt wife Hope called in requesting a call back stated pt is not feeling good .still having back stool. And is concern .Please advise [phone]

## 2022-01-07 LAB — LIPID PANEL
Cholesterol: 165 (ref 0–200)
HDL: 26 — AB (ref 35–70)
LDL Cholesterol: 88
Triglycerides: 256 — AB (ref 40–160)

## 2022-01-07 LAB — HEMOGLOBIN A1C: Hemoglobin A1C: 5.3

## 2022-01-07 LAB — BASIC METABOLIC PANEL: Creatinine: 1.2 (ref 0.6–1.3)

## 2022-01-07 LAB — CBC AND DIFFERENTIAL
Hemoglobin: 12.3 — AB (ref 13.5–17.5)
Platelets: 211 10*3/uL (ref 150–400)
WBC: 5.8

## 2022-01-08 ENCOUNTER — Telehealth: Payer: Self-pay

## 2022-01-08 NOTE — Progress Notes (Signed)
Chronic Care Management Pharmacy Assistant   Name: Justin Ali  MRN: [id number] DOB: [birth year]  Reason for Encounter: CCM (Hosptial Follow Up)  Medications: Outpatient Encounter Medications as of 01/08/2022  Medication Sig   abiraterone acetate (ZYTIGA) 250 MG tablet Take 4 tablets (1,000 mg total) by mouth daily. Take on an empty stomach 1 hour before or 2 hours after a meal   amLODipine (NORVASC) 5 MG tablet Take 1 tablet (5 mg total) by mouth daily.   CALCIUM PO Take 1,200 mg by mouth daily.   cholecalciferol (VITAMIN D) 1000 UNITS tablet Take 2,000 Units by mouth daily.    clotrimazole (LOTRIMIN) 1 % cream Apply 1 application topically 2 (two) times daily. For 2 weeks or until full resolution   gabapentin (NEURONTIN) 300 MG capsule Take 1 capsule (300 mg total) by mouth at bedtime.   HYDROcodone-acetaminophen (NORCO/VICODIN) 5-325 MG tablet Take 1 tablet by mouth 3 (three) times daily as needed for moderate pain.   lisinopril (PRINIVIL,ZESTRIL) 5 MG tablet Take 1 tablet (5 mg total) by mouth daily.   Multiple Vitamins-Minerals (CENTRUM SILVER 50+MEN) TABS Take by mouth daily.   Omega-3 Fatty Acids (FISH OIL) 1000 MG CAPS Take 2 capsules (2,000 mg total) by mouth daily.   ondansetron (ZOFRAN) 4 MG tablet Take 1 tablet (4 mg total) by mouth daily as needed for nausea or vomiting.   pantoprazole (PROTONIX) 40 MG tablet Take 1 tablet (40 mg total) by mouth every morning.   predniSONE (DELTASONE) 5 MG tablet Take 1 tablet (5 mg total) by mouth daily with breakfast.   temazepam (RESTORIL) 30 MG capsule Take 1 capsule (30 mg total) by mouth at bedtime.   traMADol (ULTRAM) 50 MG tablet Take 1 tablet (50 mg total) by mouth 2 (two) times daily as needed.   traZODone (DESYREL) 50 MG tablet Take 1 tablet (50 mg total) by mouth at bedtime.   vitamin B-12 (CYANOCOBALAMIN) 1000 MCG tablet Take 1 tablet (1,000 mcg total) by mouth daily.   No facility-administered encounter medications on  file as of 01/08/2022.   Reviewed hospital notes for details of recent visit. Patient has been contacted by Transitions of Care team: No  Admitted to the hospital on 01/02/2022. Discharge date was 01/03/2022.  Discharged from [facility]   Discharge diagnosis (Principal Problem): GI bleeding Patient was discharged to Home  Brief summary of hospital course: 81 y.o. 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    Pt states that he noted some red stool when he wiped after having a bowel movement yesterday. He had one episode of large amounts of dark stool bowel movement this morning.     Ulcer with clean base noted on EGD.  Hemoglobin stable on day of discharge.  Cleared for discharge by GI.  Defer further endoscopic evaluation outpatient.  At time of discharge patient educated on avoidance of NSAIDs.  Will initiate daily p.o. PPI.  Stable for DC home at this time.  Follow-up outpatient PCP and GI.  New?Medications Started at [facility] Discharge:?? -Started ondansetron (ZOFRAN) 4 MG tablet -Started pantoprazole (PROTONIX) 40 MG tablet  Medication Changes at Hospital Discharge: No changes  Medications Discontinued at Hospital Discharge: -Stopped ADVIL PM PO  Medications that remain the same after Hospital Discharge:??  -All other medications will remain the same.    Next CCM  appt: Non CCM  Other upcoming appts: PCP appointment on 01/10/2022 for [place], PharmD notified and will determine if action is needed.  Charlene Brooke, CPP notified  Marijean Niemann, [place] Clinical Pharmacy Assistant [phone]

## 2022-01-10 ENCOUNTER — Encounter: Payer: Self-pay | Admitting: Family Medicine

## 2022-01-10 ENCOUNTER — Ambulatory Visit (INDEPENDENT_AMBULATORY_CARE_PROVIDER_SITE_OTHER): Payer: No Typology Code available for payment source | Admitting: Family Medicine

## 2022-01-10 VITALS — BP 122/64 | HR 56 | Temp 97.4°F | Ht 66.0 in | Wt 185.4 lb

## 2022-01-10 DIAGNOSIS — R197 Diarrhea, unspecified: Secondary | ICD-10-CM | POA: Diagnosis not present

## 2022-01-10 DIAGNOSIS — K250 Acute gastric ulcer with hemorrhage: Secondary | ICD-10-CM | POA: Diagnosis not present

## 2022-01-10 DIAGNOSIS — N1831 Chronic kidney disease, stage 3a: Secondary | ICD-10-CM

## 2022-01-10 DIAGNOSIS — K269 Duodenal ulcer, unspecified as acute or chronic, without hemorrhage or perforation: Secondary | ICD-10-CM

## 2022-01-10 DIAGNOSIS — K922 Gastrointestinal hemorrhage, unspecified: Secondary | ICD-10-CM

## 2022-01-10 DIAGNOSIS — C61 Malignant neoplasm of prostate: Secondary | ICD-10-CM

## 2022-01-10 DIAGNOSIS — K921 Melena: Secondary | ICD-10-CM

## 2022-01-10 DIAGNOSIS — C7951 Secondary malignant neoplasm of bone: Secondary | ICD-10-CM

## 2022-01-10 DIAGNOSIS — I10 Essential (primary) hypertension: Secondary | ICD-10-CM

## 2022-01-10 NOTE — Progress Notes (Unsigned)
Patient ID: Justin Ali, male    DOB: [birth year], 81 y.o.   MRN: [id number]  This visit was conducted in person.  BP 122/64   Pulse (!) 56   Temp (!) 97.4 F (36.3 C) (Temporal)   Ht '5\' 6"'$  (1.676 m)   Wt 185 lb 6 oz (84.1 kg)   SpO2 96%   BMI 29.92 kg/m   Orthostatic VS for the past 24 hrs (Last 3 readings):  BP- Lying BP- Standing at 3 minutes  01/10/22 1436 -- 118/68  01/10/22 1433 118/70 --    CC: hosp f/u visit  Subjective:   HPI: Justin Ali is a 81 y.o. male presenting on 01/10/2022 for Hospitalization Follow-up (Admitted on 01/02/22 at [facility], dx lower GI bleed; GI hemorrhage. Pt accompanied by wife, Justin Ali.)   Recent hospitalization for black stools, without dyspnea, chest pain, dizziness.  Hospital records reviewed. Med rec performed.  EGD in hospital [facility]) - normal esophagus, erosive gastropathy without recent bleed, nonbleeding gastric ulcers and duodenal ulcer with flat pigmented spot. Rec daily PPI and repeat EGD in 3-4 wks. Rec avoid NSAIDs.   Since home denies dyspnea, nausea, vomiting, diarrhea or black/bloody stools.  Occasional orthostatic dizziness.  Notes chronic LLQ abd pain - had overall unrevealing CT scan done 08/2021  Since home having watery diarrhea once daily.  No recent antibiotics.   Home health not set up.  Other follow up appointments scheduled: GI Dr Allen Norris rec 4 wk f/u - not yet scheduled. ______________________________________________________________________ Hospital admission: 01/02/2022 Hospital discharge: 01/03/2022 TCM f/u phone call: not performed   Recommendations for Outpatient Follow-up:  Follow up with PCP in 1-2 weeks Upper GI 3 to 4 weeks   Home Health: No Equipment/Devices: None   Discharge Condition: Stable CODE STATUS: Full Diet recommendation: Soft  Discharge Diagnoses:  Principal Problem:   GI bleeding Active Problems:   Acute blood loss anemia   HTN (hypertension)   HLD (hyperlipidemia)   CAD (coronary  artery disease), native coronary artery   Chronic kidney disease, stage 3a ([place])   Prostate cancer metastatic to bone [facility])     Relevant past medical, surgical, family and social history reviewed and updated as indicated. Interim medical history since our last visit reviewed. Allergies and medications reviewed and updated. Outpatient Medications Prior to Visit  Medication Sig Dispense Refill   abiraterone acetate (ZYTIGA) 250 MG tablet Take 4 tablets (1,000 mg total) by mouth daily. Take on an empty stomach 1 hour before or 2 hours after a meal 120 tablet 5   amLODipine (NORVASC) 5 MG tablet Take 1 tablet (5 mg total) by mouth daily. 90 tablet 3   CALCIUM PO Take 1,200 mg by mouth daily.     cholecalciferol (VITAMIN D) 1000 UNITS tablet Take 2,000 Units by mouth daily.      clotrimazole (LOTRIMIN) 1 % cream Apply 1 application topically 2 (two) times daily. For 2 weeks or until full resolution 30 g 0   gabapentin (NEURONTIN) 300 MG capsule Take 1 capsule (300 mg total) by mouth at bedtime. 90 capsule 3   HYDROcodone-acetaminophen (NORCO/VICODIN) 5-325 MG tablet Take 1 tablet by mouth 3 (three) times daily as needed for moderate pain. 90 tablet 0   lisinopril (PRINIVIL,ZESTRIL) 5 MG tablet Take 1 tablet (5 mg total) by mouth daily. 90 tablet 3   Multiple Vitamins-Minerals (CENTRUM SILVER 50+MEN) TABS Take by mouth daily.     Omega-3 Fatty Acids (FISH OIL) 1000 MG CAPS Take 2  capsules (2,000 mg total) by mouth daily.     ondansetron (ZOFRAN) 4 MG tablet Take 1 tablet (4 mg total) by mouth daily as needed for nausea or vomiting. 30 tablet 1   pantoprazole (PROTONIX) 40 MG tablet Take 1 tablet (40 mg total) by mouth every morning. 30 tablet 1   predniSONE (DELTASONE) 5 MG tablet Take 1 tablet (5 mg total) by mouth daily with breakfast. 30 tablet 5   temazepam (RESTORIL) 30 MG capsule Take 1 capsule (30 mg total) by mouth at bedtime. 30 capsule 0   traMADol (ULTRAM) 50 MG tablet Take 1 tablet (50  mg total) by mouth 2 (two) times daily as needed. 30 tablet 0   traZODone (DESYREL) 50 MG tablet Take 1 tablet (50 mg total) by mouth at bedtime. 90 tablet 3   vitamin B-12 (CYANOCOBALAMIN) 1000 MCG tablet Take 1 tablet (1,000 mcg total) by mouth daily.     No facility-administered medications prior to visit.     Per HPI unless specifically indicated in ROS section below Review of Systems  Objective:  BP 122/64   Pulse (!) 56   Temp (!) 97.4 F (36.3 C) (Temporal)   Ht '5\' 6"'$  (1.676 m)   Wt 185 lb 6 oz (84.1 kg)   SpO2 96%   BMI 29.92 kg/m   Wt Readings from Last 3 Encounters:  01/10/22 185 lb 6 oz (84.1 kg)  01/02/22 179 lb 14.3 oz (81.6 kg)  12/26/21 188 lb (85.3 kg)      Physical Exam Vitals and nursing note reviewed.  Constitutional:      Appearance: Normal appearance. He is not ill-appearing.  HENT:     Mouth/Throat:     Mouth: Mucous membranes are moist.     Pharynx: Oropharynx is clear. No oropharyngeal exudate or posterior oropharyngeal erythema.  Eyes:     Extraocular Movements: Extraocular movements intact.     Pupils: Pupils are equal, round, and reactive to light.  Cardiovascular:     Rate and Rhythm: Normal rate and regular rhythm.     Pulses: Normal pulses.     Heart sounds: Murmur heard.  Pulmonary:     Effort: Pulmonary effort is normal. No respiratory distress.     Breath sounds: Normal breath sounds. No wheezing, rhonchi or rales.  Musculoskeletal:     Right lower leg: No edema.     Left lower leg: No edema.  Skin:    General: Skin is warm and dry.     Findings: No rash.  Neurological:     Mental Status: He is alert.  Psychiatric:        Mood and Affect: Mood normal.        Behavior: Behavior normal.       Results for orders placed or performed during the hospital encounter of 01/02/22  CBC  Result Value Ref Range   WBC 6.6 4.0 - 10.5 K/uL   RBC 3.77 (L) 4.22 - 5.81 MIL/uL   Hemoglobin 11.8 (L) 13.0 - 17.0 g/dL   HCT 35.6 (L) 39.0 -  52.0 %   MCV 94.4 80.0 - 100.0 fL   MCH 31.3 26.0 - 34.0 pg   MCHC 33.1 30.0 - 36.0 g/dL   RDW 13.9 11.5 - 15.5 %   Platelets 199 150 - 400 K/uL   nRBC 0.0 0.0 - 0.2 %  Basic metabolic panel  Result Value Ref Range   Sodium 146 (H) 135 - 145 mmol/L   Potassium 4.2 3.5 -  5.1 mmol/L   Chloride 112 (H) 98 - 111 mmol/L   CO2 26 22 - 32 mmol/L   Glucose, Bld 118 (H) 70 - 99 mg/dL   BUN 31 (H) 8 - 23 mg/dL   Creatinine, Ser 1.38 (H) 0.61 - 1.24 mg/dL   Calcium 9.4 8.9 - 10.3 mg/dL   GFR, Estimated 52 (L) >60 mL/min   Anion gap 8 5 - 15  CBC  Result Value Ref Range   WBC 6.1 4.0 - 10.5 K/uL   RBC 3.78 (L) 4.22 - 5.81 MIL/uL   Hemoglobin 11.5 (L) 13.0 - 17.0 g/dL   HCT 35.8 (L) 39.0 - 52.0 %   MCV 94.7 80.0 - 100.0 fL   MCH 30.4 26.0 - 34.0 pg   MCHC 32.1 30.0 - 36.0 g/dL   RDW 13.8 11.5 - 15.5 %   Platelets 193 150 - 400 K/uL   nRBC 0.0 0.0 - 0.2 %  CBC  Result Value Ref Range   WBC 5.2 4.0 - 10.5 K/uL   RBC 3.38 (L) 4.22 - 5.81 MIL/uL   Hemoglobin 10.4 (L) 13.0 - 17.0 g/dL   HCT 31.1 (L) 39.0 - 52.0 %   MCV 92.0 80.0 - 100.0 fL   MCH 30.8 26.0 - 34.0 pg   MCHC 33.4 30.0 - 36.0 g/dL   RDW 13.8 11.5 - 15.5 %   Platelets 164 150 - 400 K/uL   nRBC 0.0 0.0 - 0.2 %  CBC  Result Value Ref Range   WBC 4.8 4.0 - 10.5 K/uL   RBC 3.17 (L) 4.22 - 5.81 MIL/uL   Hemoglobin 9.9 (L) 13.0 - 17.0 g/dL   HCT 29.4 (L) 39.0 - 52.0 %   MCV 92.7 80.0 - 100.0 fL   MCH 31.2 26.0 - 34.0 pg   MCHC 33.7 30.0 - 36.0 g/dL   RDW 13.4 11.5 - 15.5 %   Platelets 148 (L) 150 - 400 K/uL   nRBC 0.0 0.0 - 0.2 %  Protime-INR  Result Value Ref Range   Prothrombin Time 14.1 11.4 - 15.2 seconds   INR 1.1 0.8 - 1.2  APTT  Result Value Ref Range   aPTT 32 24 - 36 seconds  CBC  Result Value Ref Range   WBC 3.9 (L) 4.0 - 10.5 K/uL   RBC 3.19 (L) 4.22 - 5.81 MIL/uL   Hemoglobin 10.0 (L) 13.0 - 17.0 g/dL   HCT 29.5 (L) 39.0 - 52.0 %   MCV 92.5 80.0 - 100.0 fL   MCH 31.3 26.0 - 34.0 pg   MCHC 33.9  30.0 - 36.0 g/dL   RDW 13.5 11.5 - 15.5 %   Platelets 144 (L) 150 - 400 K/uL   nRBC 0.0 0.0 - 0.2 %  Comprehensive metabolic panel  Result Value Ref Range   Sodium 142 135 - 145 mmol/L   Potassium 3.6 3.5 - 5.1 mmol/L   Chloride 117 (H) 98 - 111 mmol/L   CO2 22 22 - 32 mmol/L   Glucose, Bld 95 70 - 99 mg/dL   BUN 18 8 - 23 mg/dL   Creatinine, Ser 0.97 0.61 - 1.24 mg/dL   Calcium 7.9 (L) 8.9 - 10.3 mg/dL   Total Protein 5.2 (L) 6.5 - 8.1 g/dL   Albumin 3.0 (L) 3.5 - 5.0 g/dL   AST 14 (L) 15 - 41 U/L   ALT 8 0 - 44 U/L   Alkaline Phosphatase 44 38 - 126 U/L   Total Bilirubin  0.8 0.3 - 1.2 mg/dL   GFR, Estimated >60 >60 mL/min   Anion gap 3 (L) 5 - 15  CBC  Result Value Ref Range   WBC 4.6 4.0 - 10.5 K/uL   RBC 3.23 (L) 4.22 - 5.81 MIL/uL   Hemoglobin 10.0 (L) 13.0 - 17.0 g/dL   HCT 29.9 (L) 39.0 - 52.0 %   MCV 92.6 80.0 - 100.0 fL   MCH 31.0 26.0 - 34.0 pg   MCHC 33.4 30.0 - 36.0 g/dL   RDW 13.4 11.5 - 15.5 %   Platelets 150 150 - 400 K/uL   nRBC 0.0 0.0 - 0.2 %  Glucose, capillary  Result Value Ref Range   Glucose-Capillary 94 70 - 99 mg/dL  Type and screen [place]  Result Value Ref Range   ABO/RH(D) O POS    Antibody Screen NEG    Sample Expiration      01/05/2022,2359 Performed at [facility], [address] [place], [place] [address]     Assessment & Plan:   Problem List Items Addressed This Visit   None Visit Diagnoses     Watery diarrhea    -  Primary   Relevant Orders   C. difficile GDH and Toxin A/B   Basic metabolic panel   CBC with Differential/Platelet        No orders of the defined types were placed in this encounter.  Orders Placed This Encounter  Procedures   C. difficile GDH and Toxin A/B    Standing Status:   Future    Standing Expiration Date:   3/76/2831   Basic metabolic panel   CBC with Differential/Platelet     Patient Instructions  Good to see you today.  Pass by lab to pick up stool  test (C diff).  Labs today.  We will refer you back to Dr Celesta Aver office to discuss colonoscopy  You may call [facility] to schedule an appointment at [phone].    Follow up plan: Return if symptoms worsen or fail to improve.  Ria Bush, MD

## 2022-01-10 NOTE — Patient Instructions (Addendum)
Good to see you today.  Pass by lab to pick up stool test (C diff).  Labs today.  We will refer you back to Dr Celesta Aver office to discuss colonoscopy  You may call [facility] to schedule an appointment at [phone].

## 2022-01-11 ENCOUNTER — Encounter: Payer: Self-pay | Admitting: Family Medicine

## 2022-01-11 LAB — BASIC METABOLIC PANEL
BUN: 15 mg/dL (ref 7–25)
CO2: 24 mmol/L (ref 20–32)
Calcium: 8.9 mg/dL (ref 8.6–10.3)
Chloride: 107 mmol/L (ref 98–110)
Creat: 1.16 mg/dL (ref 0.70–1.22)
Glucose, Bld: 124 mg/dL — ABNORMAL HIGH (ref 65–99)
Potassium: 4.6 mmol/L (ref 3.5–5.3)
Sodium: 140 mmol/L (ref 135–146)

## 2022-01-11 LAB — CBC WITH DIFFERENTIAL/PLATELET
Absolute Monocytes: 212 cells/uL (ref 200–950)
Basophils Absolute: 42 cells/uL (ref 0–200)
Basophils Relative: 0.8 %
Eosinophils Absolute: 58 cells/uL (ref 15–500)
Eosinophils Relative: 1.1 %
HCT: 33.6 % — ABNORMAL LOW (ref 38.5–50.0)
Hemoglobin: 11.5 g/dL — ABNORMAL LOW (ref 13.2–17.1)
Lymphs Abs: 1108 cells/uL (ref 850–3900)
MCH: 31.9 pg (ref 27.0–33.0)
MCHC: 34.2 g/dL (ref 32.0–36.0)
MCV: 93.1 fL (ref 80.0–100.0)
MPV: 10.3 fL (ref 7.5–12.5)
Monocytes Relative: 4 %
Neutro Abs: 3880 cells/uL (ref 1500–7800)
Neutrophils Relative %: 73.2 %
Platelets: 234 10*3/uL (ref 140–400)
RBC: 3.61 10*6/uL — ABNORMAL LOW (ref 4.20–5.80)
RDW: 13.5 % (ref 11.0–15.0)
Total Lymphocyte: 20.9 %
WBC: 5.3 10*3/uL (ref 3.8–10.8)

## 2022-01-11 NOTE — Assessment & Plan Note (Signed)
Since home he notes 1 episode of watery diarrhea daily. With recent hospitalization, will check C diff.

## 2022-01-11 NOTE — Assessment & Plan Note (Signed)
This has largely resolved.  Will refer to GI for appt in 3-4 wks.  Continue PPI indefinitely.

## 2022-01-11 NOTE — Assessment & Plan Note (Addendum)
Now on daily pantoprazole, off advil PM.  Will refer to GI for follow up visit, may need rtp EGD and colonoscopy. He states he previously saw Dr [name] (colonoscopy 2014) Update CBC (discharge hemoglobin was 10.0).

## 2022-01-11 NOTE — Assessment & Plan Note (Signed)
Orthostatic vital signs normal today.  Continue low dose lisinopril and amlodipine.

## 2022-01-11 NOTE — Assessment & Plan Note (Signed)
Update renal panel 

## 2022-01-11 NOTE — Assessment & Plan Note (Signed)
Presumed from gastric or duodenal ulcer - no active bleed on EGD while hospitalized. Continue PPI, continue avoiding NSAIDs

## 2022-01-13 ENCOUNTER — Encounter: Payer: Self-pay | Admitting: Family Medicine

## 2022-01-13 ENCOUNTER — Other Ambulatory Visit: Payer: Self-pay | Admitting: Radiology

## 2022-01-13 DIAGNOSIS — R197 Diarrhea, unspecified: Secondary | ICD-10-CM | POA: Diagnosis not present

## 2022-01-13 NOTE — Telephone Encounter (Signed)
Pt is still having black stool

## 2022-01-13 NOTE — Telephone Encounter (Signed)
Patient seen by Dr. Danise Mina 01/10/22.

## 2022-01-15 ENCOUNTER — Encounter: Payer: Self-pay | Admitting: Family Medicine

## 2022-01-15 LAB — C. DIFFICILE GDH AND TOXIN A/B
GDH ANTIGEN: DETECTED
MICRO NUMBER:: 13931270
SPECIMEN QUALITY:: ADEQUATE
TOXIN A AND B: NOT DETECTED

## 2022-01-15 LAB — CLOSTRIDIUM DIFFICILE TOXIN B, QUALITATIVE, REAL-TIME PCR: Toxigenic C. Difficile by PCR: NOT DETECTED

## 2022-01-15 NOTE — Telephone Encounter (Signed)
Plz triage pt.  Recently hospitalized due to GI bleed.

## 2022-01-15 NOTE — Telephone Encounter (Signed)
Spoke to patient by telephone and was advised that he gets a little  dizzy sometimes when he stands up too fast. Patient stated that it happens about twice a week.  Patient stated that he had 3 bowel movements yesterday and it was like water but brown in color. Patient denies any abdominal pain. Patient stated that all he is having is watery stools.

## 2022-01-21 ENCOUNTER — Telehealth: Payer: Self-pay | Admitting: Family Medicine

## 2022-01-21 NOTE — Telephone Encounter (Signed)
Pt is suppose to have colonoscopy done on 01/29/22. [facility] is saying its going to be certain amount of days before they can give a authorization. Pt is wondering if Dr. Darnell Level could do something about speeding up the process or give some advice? Call back # [phone]

## 2022-01-21 NOTE — Telephone Encounter (Signed)
Spoke to patient by telephone and was advised that he has a PCP with the [place]. Patient stated that he has called them several times and can not get to the right person. Patient stated that he was thinking that Dr. Danise Mina could do a referral to the [facility] also. Patient was asked if he has a number that Dr. Danise Mina could call and he said that he will take care of it and hung up the phone.

## 2022-01-25 ENCOUNTER — Other Ambulatory Visit: Payer: Self-pay | Admitting: Family Medicine

## 2022-01-27 NOTE — Telephone Encounter (Signed)
Name of Medication: Temazepam Name of Pharmacy: [facility] or Written Date and Quantity: 12/23/21, #30 Last Office Visit and Type: 01/10/22, hosp f/u Next Office Visit and Type: none Last Controlled Substance Agreement Date: none Last UDS: none

## 2022-01-28 NOTE — Telephone Encounter (Signed)
Pt called asking for a update on his refill request. Pt stated he's completely out. Call back # [phone]

## 2022-01-28 NOTE — Telephone Encounter (Signed)
plz notify this was sent into his pharmacy.

## 2022-01-28 NOTE — Telephone Encounter (Signed)
Spoke with pt notifying him rx was sent in.  Expresses his thanks.

## 2022-01-29 ENCOUNTER — Ambulatory Visit: Payer: Medicare Other | Admitting: Nurse Practitioner

## 2022-01-29 ENCOUNTER — Encounter: Payer: Self-pay | Admitting: Nurse Practitioner

## 2022-01-29 ENCOUNTER — Other Ambulatory Visit (INDEPENDENT_AMBULATORY_CARE_PROVIDER_SITE_OTHER): Payer: No Typology Code available for payment source

## 2022-01-29 VITALS — BP 108/58 | HR 67 | Ht 66.0 in | Wt 184.0 lb

## 2022-01-29 DIAGNOSIS — K259 Gastric ulcer, unspecified as acute or chronic, without hemorrhage or perforation: Secondary | ICD-10-CM | POA: Diagnosis not present

## 2022-01-29 DIAGNOSIS — D649 Anemia, unspecified: Secondary | ICD-10-CM | POA: Diagnosis not present

## 2022-01-29 LAB — IBC + FERRITIN
Ferritin: 24 ng/mL (ref 22.0–322.0)
Iron: 63 ug/dL (ref 42–165)
Saturation Ratios: 19.1 % — ABNORMAL LOW (ref 20.0–50.0)
TIBC: 330.4 ug/dL (ref 250.0–450.0)
Transferrin: 236 mg/dL (ref 212.0–360.0)

## 2022-01-29 LAB — CBC
HCT: 34.6 % — ABNORMAL LOW (ref 39.0–52.0)
Hemoglobin: 12 g/dL — ABNORMAL LOW (ref 13.0–17.0)
MCHC: 34.7 g/dL (ref 30.0–36.0)
MCV: 92.8 fl (ref 78.0–100.0)
Platelets: 188 K/uL (ref 150.0–400.0)
RBC: 3.73 Mil/uL — ABNORMAL LOW (ref 4.22–5.81)
RDW: 14.1 % (ref 11.5–15.5)
WBC: 5 K/uL (ref 4.0–10.5)

## 2022-01-29 LAB — BASIC METABOLIC PANEL
BUN: 15 mg/dL (ref 6–23)
CO2: 27 mEq/L (ref 19–32)
Calcium: 9.2 mg/dL (ref 8.4–10.5)
Chloride: 107 mEq/L (ref 96–112)
Creatinine, Ser: 1.25 mg/dL (ref 0.40–1.50)
GFR: 54.5 mL/min — ABNORMAL LOW (ref >60.00)
Glucose, Bld: 144 mg/dL — ABNORMAL HIGH (ref 70–99)
Potassium: 4.1 mEq/L (ref 3.5–5.1)
Sodium: 141 mEq/L (ref 135–145)

## 2022-01-29 MED ORDER — PANTOPRAZOLE SODIUM 40 MG PO TBEC: 40.0000 mg | DELAYED_RELEASE_TABLET | Freq: Every morning | ORAL | 3 refills | Status: DC

## 2022-01-29 NOTE — Patient Instructions (Addendum)
Your provider has requested that you go to the basement level for lab work before leaving today. Press "B" on the elevator. The lab is located at the first door on the left as you exit the elevator.  You have been scheduled for an endoscopy. Please follow written instructions given to you at your visit today. If you use inhalers (even only as needed), please bring them with you on the day of your procedure.  Benefiber 1 tablespoon daily to bulk up stool  Miralax 1 capful mixed in 8 ounces of water at bed time as needed to avoid straining, avoid constipation  Continue Pantoprazole '40mg'$  once daily  Go to the Emergency room if you develop frequent loose black stools   The [facility] providers would like to encourage you to use [facility] to communicate with providers for non-urgent requests or questions.  Due to long hold times on the telephone, sending your provider a message by [facility] may be a faster and more efficient way to get a response.  Please allow 48 business hours for a response.  Please remember that this is for non-urgent requests.   Due to recent changes in healthcare laws, you may see the results of your imaging and laboratory studies on MyChart before your provider has had a chance to review them.  We understand that in some cases there may be results that are confusing or concerning to you. Not all laboratory results come back in the same time frame and the provider may be waiting for multiple results in order to interpret others.  Please give [place] 48 hours in order for your provider to thoroughly review all the results before contacting the office for clarification of your results.

## 2022-01-29 NOTE — Progress Notes (Signed)
01/29/2022 Justin Ali [id number] [birth year]   CHIEF COMPLAINT: Hospital follow-up, constipation  HISTORY OF PRESENT ILLNESS: Justin Ali is an 81 year old male with a past medical history of hypertension, hyperlipidemia, coronary artery disease, sleep apnea does not use CPAP, metastatic prostate cancer, anemia, gastric ulcer, cecal AVM, tubular adenomatous, tubulovillous adenomatous colon polyps.  Previously known by Dr. [name] in 2014 - 2015.  He presents her office today as referred by Dr. Ria Bush for further evaluation regarding gastric ulcers.  He awakened in the morning on 01/02/2022 and passed a large amount of black tarry stool.  The night before, he noted seeing a small amount of bright red blood on the toilet tissue.  He presented to [facility] ED for further evaluation. He denied having any nausea, vomiting or abdominal pain. Admission Hg 11.8 (baseline Hg 13.2). Not on blood thinners.  He endorsed taking Aleve one tab Q HS for 5+ years. He underwent an EGD by Dr. Allen Norris which showed a normal esophagus, erosive gastropathy without stigmata of recent bleeding, nonbleeding gastric ulcers with pigmented material and nonbleeding duodenal ulcer with a flat pigmented spot.  Biopsies were not obtained.  He did not require blood transfusion.  He did not demonstrate any further active GI bleeding.  He was discharged home 01/03/2022 on Pantoprazole 40 mg once daily.  He remains on Pantoprazole 40 mg once daily.  He has avoided NSAIDs since he was discharged from the hospital.  He denies having any dysphagia or heartburn.  He has intermittent chronic right-sided abdominal/flank pain comes and goes.  He takes hydrocodone 1 tab in the morning and 1 tab in the p.m. which sometimes results in constipation.  He typically passes a bowel movement every 3 days which requires straining.  He takes Dulcolax as needed which results in passing a large stool and sometimes looser stools.  He drinks less  than 64 ounces of water daily.  He underwent a CTAP 09/16/2021 which showed a moderate amount of stool throughout the colon without evidence of intra-abdominal/pelvic metastasis. 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  He has a history of a large ascending tubulovillous adenomatous polyp and multiple tubular adenomatous polyps per colonoscopy in 2014.  A follow-up colonoscopy 03/22/2014 showed an intact polypectomy site to the ascending colon with tattoo present and a small nonbleeding cecal AVM.  Biopsy showed benign colonic mucosa without evidence of residual tubulovillous adenoma or dysplasia.      Latest Ref Rng & Units 01/10/2022    2:49 PM 01/07/2022   12:00 AM 01/03/2022   10:45 AM  CBC  WBC 3.8 - 10.8 Thousand/uL 5.3  5.8     4.6   Hemoglobin 13.2 - 17.1 g/dL 11.5  12.3     10.0   Hematocrit 38.5 - 50.0 % 33.6   29.9   Platelets 140 - 400 Thousand/uL 234  211     150      This result is from an external source.    MCV 93.1.      Latest Ref Rng & Units 01/10/2022    2:49 PM 01/07/2022   12:00 AM 01/03/2022    5:24 AM  CMP  Glucose 65 - 99 mg/dL 124   95   BUN 7 - 25 mg/dL 15   18   Creatinine 0.70 - 1.22 mg/dL 1.16  1.2     0.97   Sodium 135 - 146  mmol/L 140   142   Potassium 3.5 - 5.3 mmol/L 4.6   3.6   Chloride 98 - 110 mmol/L 107   117   CO2 20 - 32 mmol/L 24   22   Calcium 8.6 - 10.3 mg/dL 8.9   7.9   Total Protein 6.5 - 8.1 g/dL   5.2   Total Bilirubin 0.3 - 1.2 mg/dL   0.8   Alkaline Phos 38 - 126 U/L   44   AST 15 - 41 U/L   14   ALT 0 - 44 U/L   8      This result is from an external source.     RADIOLOGY STUDIES:  CT CHEST, ABDOMEN, AND PELVIS WITH CONTRAST 09/16/2021:   TECHNIQUE: Multidetector CT imaging of the chest, abdomen and pelvis was performed following the standard protocol during bolus administration of intravenous contrast.   RADIATION DOSE REDUCTION: This  exam was performed according to the departmental dose-optimization program which includes automated exposure control, adjustment of the mA and/or kV according to patient size and/or use of iterative reconstruction technique.   CONTRAST:  130m OMNIPAQUE IOHEXOL 300 MG/ML  SOLN   COMPARISON:  Multiple priors including most recent nuclear medicine bone scan January 03, 2021 and CT Sep 19, 2016   FINDINGS: CT CHEST FINDINGS   Cardiovascular: Aortic atherosclerosis without thoracic aortic aneurysm. No central pulmonary embolus on this nondedicated study. Coronary artery calcifications. Normal size heart. No significant pericardial effusion/thickening.   Mediastinum/Nodes: No supraclavicular adenopathy. No discrete thyroid nodule. No pathologically enlarged mediastinal, hilar or axillary lymph nodes. Esophagus is grossly unremarkable.   Lungs/Pleura: No suspicious pulmonary nodules or masses. Bibasilar atelectasis versus scarring. No pleural effusion. No pneumothorax.   Musculoskeletal: Extensive sclerotic osseous lesions involving the scapulae, clavicles, ribs, sternum and vertebral bodies. Axial   CT ABDOMEN PELVIS FINDINGS   Hepatobiliary: Calcified hepatic granulomata. No suspicious hepatic lesion. Cholelithiasis without findings of acute cholecystitis. No biliary ductal dilation.   Pancreas: No pancreatic ductal dilation or evidence of acute inflammation.   Spleen: No splenomegaly or focal splenic lesion.   Adrenals/Urinary Tract: Bilateral adrenal glands appear normal. Benign hypodense bilateral renal cysts require no independent follow-up. No hydronephrosis. No suspicious renal mass. Urinary bladder is unremarkable for degree of distension.   Stomach/Bowel: Radiopaque enteric contrast material traverses the splenic flexure. Stomach is unremarkable for degree of distension. No pathologic dilation of small or large bowel. The appendix and terminal ileum are within  normal limits. Moderate volume of formed stool throughout the colon suggestive of constipation. No evidence of acute bowel inflammation.   Vascular/Lymphatic: Aortic and branch vessel atherosclerosis without abdominal aortic aneurysm. No pathologically enlarged abdominal or pelvic lymph nodes.   Reproductive: Prostate gland is atrophic with dystrophic prostatic calcifications.   Other: No significant abdominopelvic free fluid.   Musculoskeletal: Extensive osteoblastic metastatic osseous lesions involving the spine, pelvic girdle and proximal femurs increased in comparison to CT from 2018.   IMPRESSION: 1. 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 Independent comparison to nuclear medicine bone scan without corresponding CT imaging is difficult and incomplete, there is no convincing CT evidence of active/progressive disease in comparison to recent and more remote bone scans and CTs. If concern for active osteoblastic metastatic disease, suggest evaluation with nuclear medicine bone scan. 2. No evidence of active non-osseous metastatic disease within the chest, abdomen or pelvis. 3. Cholelithiasis without findings of acute cholecystitis. 4. Moderate  volume of formed stool throughout the colon suggestive of constipation. 5.  Aortic Atherosclerosis (ICD10-I70.0).  GI PROCEDURES:   EGD 01/02/2022 by Lucilla Lame: - Normal esophagus. - Erosive gastropathy with no stigmata of recent bleeding. - Non-bleeding gastric ulcers with pigmented material. - Non-bleeding duodenal ulcer with a flat pigmented spot (Forrest Class IIc). - No specimens collected.  Colonoscopy 03/22/2014 by Dr. [name]: 1) Polypectomy site in ascending colon with tattoo, no obvious polyp, biopsies taken. 2) Small non-bleeding cecal AVM 3) Otherwise normal, excellent prep Recall colonoscopy 2018 or 2019 BENIGN COLORECTAL  MUCOSA, ALL FRAGMENTS. - NO DYSPLASIA OR MALIGNANCY IDENTIFIED  Colonoscopy 03/18/2013: Sessile polyp, residual measuring 8 to 10 mm in size was found in the ascending colon, polypectomy was performed using snare cautery 5 sessile polyps measuring 3 to 8 mm in size were found at the hepatic flexure and in the transverse colon, polypectomy was performed using snare cautery and with a cold snare The colonic mucosa was otherwise normal except for a 2 mm cecal AVM 1. Surgical [P], ascending, residual polyp MULTIPLE FRAGMENTS OF TUBULOVILLOUS ADENOMA. NO HIGH GRADE DYSPLASIA OR MALIGNANCY IDENTIFIED. 2. Surgical [P], hepatic flexure, transverse, polyp (6) MULTIPLE FRAGMENTS OF TUBULAR ADENOMA. NO HIGH GRADE DYSPLASIA OR MALIGNANCY IDENTIFIED.  Colonoscopy 08/12/2012: 5 polyps measuring 5 to 20 mm in size were found in the cecum, ascending colon, hepatic flexure and in the transverse colon, polypectomy was performed, a sending site marked with SPOT. The colon mucosa was otherwise normal 1. Surgical [P], cecum polyp (multiple pieces) - TUBULOVILLOUS ADENOMA. - HIGH GRADE DYSPLASIA IS NOT IDENTIFIED. 2. Surgical [P], ascending colon polyps x 2 (multiple pieces) - SESSILE SERRATED ADENOMA(S) (MULTIPLE FRAGMENTS). - TUBULAR ADENOMA(S) (MULTIPLE FRAGMENTS). - HIGH GRADE DYSPLASIA IS NOT IDENTIFIED. 3. Surgical [P], hepatic flexure, transverse polyps (multiple pieces) - TUBULAR ADENOMAS. 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   2 mm - non-bleeding 03/18/2013 colonoscopy    CAD (coronary artery disease), native coronary artery 2014   By CT lung (04/2013)    Cataract    Gastric ulcer    HLD (hyperlipidemia)    HTN (hypertension)    Hyperglycemia    NAFLD (nonalcoholic fatty liver disease) 08/2011   by abd [place] with increased LFTs   OSA (obstructive sleep apnea)    did not tolerate CPAP   Other benign neoplasm of  connective and other soft tissue of unspecified site    Neurofibroma of lateral periorbital area   Peripheral neuropathy    Personal history of colonic adenomas 08/05/2012   Pneumonia 01/2013   CAP LUL   Restless legs syndrome (RLS)    Sleep apnea    no cpap   Past Surgical History:  Procedure Laterality Date   COLONOSCOPY  07/2012   5 adenomas (tubular, TV, serrated), rec rpt 5 months [name])   COLONOSCOPY  02/2013   residual adenomas, cecal AVM, rec rpt 1 yr [name])   COLONOSCOPY  03/2014   no residual polyp, cecal AVM, rpt 3-4 yrs [name])   ECTROPION REPAIR Bilateral 09/29/2017   Procedure: REPAIR OF ECTROPION, EXTENSIVE UPPER AND LOWER;  Surgeon: Karle Starch, MD;  Location: [facility];  Service: Ophthalmology;  Laterality: Bilateral;   ESOPHAGOGASTRODUODENOSCOPY (EGD) WITH PROPOFOL N/A 01/02/2022   normal esophagus, erosive gastropathy without recent bleed, nonbleeding gastric ulcers and duodenal ulcer with flat pigmented spotWohl, [name], MD)   ORIF ANKLE FRACTURE  11/29/2000   Dr.  Smith   PTOSIS REPAIR Bilateral 09/29/2017   Procedure: BLEPHAROPTOSIS REPAIR RESECT EX UPPER;  Surgeon: Karle Starch, MD;  Location: [facility];  Service: Ophthalmology;  Laterality: Bilateral;   RECONSTRUCTION OF EYELID Bilateral 05/31/2018   SKIN CANCER EXCISION  1997   left anterior neck   Social History: He is married.  Retired.  He has 1 son and 1 daughter.  Non-smoker.  No alcohol use.  No drug use.  Family History: family history includes Alcohol abuse in his father; Breast cancer in his mother; Cancer in his father, mother, and another family member; Coronary artery disease in his paternal aunt; Hypertension in his brother; Liver cancer in his father.  No Known Allergies    Outpatient Encounter Medications as of 01/29/2022  Medication Sig   abiraterone acetate (ZYTIGA) 250 MG tablet Take 4 tablets (1,000 mg total) by mouth daily. Take on an empty stomach 1 hour  before or 2 hours after a meal   amLODipine (NORVASC) 5 MG tablet Take 1 tablet (5 mg total) by mouth daily.   CALCIUM PO Take 1,200 mg by mouth daily.   cholecalciferol (VITAMIN D) 1000 UNITS tablet Take 2,000 Units by mouth daily.    clotrimazole (LOTRIMIN) 1 % cream Apply 1 application topically 2 (two) times daily. For 2 weeks or until full resolution   gabapentin (NEURONTIN) 300 MG capsule Take 1 capsule (300 mg total) by mouth at bedtime.   HYDROcodone-acetaminophen (NORCO/VICODIN) 5-325 MG tablet Take 1 tablet by mouth 3 (three) times daily as needed for moderate pain.   lisinopril (PRINIVIL,ZESTRIL) 5 MG tablet Take 1 tablet (5 mg total) by mouth daily.   Multiple Vitamins-Minerals (CENTRUM SILVER 50+MEN) TABS Take by mouth daily.   Omega-3 Fatty Acids (FISH OIL) 1000 MG CAPS Take 2 capsules (2,000 mg total) by mouth daily.   ondansetron (ZOFRAN) 4 MG tablet Take 1 tablet (4 mg total) by mouth daily as needed for nausea or vomiting.   pantoprazole (PROTONIX) 40 MG tablet Take 1 tablet (40 mg total) by mouth every morning.   predniSONE (DELTASONE) 5 MG tablet Take 1 tablet (5 mg total) by mouth daily with breakfast.   temazepam (RESTORIL) 30 MG capsule Take 1 capsule (30 mg total) by mouth at bedtime.   traMADol (ULTRAM) 50 MG tablet Take 1 tablet (50 mg total) by mouth 2 (two) times daily as needed.   traZODone (DESYREL) 50 MG tablet Take 1 tablet (50 mg total) by mouth at bedtime.   vitamin B-12 (CYANOCOBALAMIN) 1000 MCG tablet Take 1 tablet (1,000 mcg total) by mouth daily.   No facility-administered encounter medications on file as of 01/29/2022.    REVIEW OF SYSTEMS:  Gen: Denies fever, sweats or chills. No weight loss.  CV: Denies chest pain, palpitations or edema. Resp: Denies cough, shortness of breath of hemoptysis.  GI: See HPI.  GU : Denies urinary burning, blood in urine, increased urinary frequency or incontinence. MS: + Back and right flank/abdominal pain.  Derm:  Denies rash, itchiness, skin lesions or unhealing ulcers. Psych: Denies depression, anxiety or significant memory loss. Heme: Denies bruising, easy bleeding. Neuro:  Denies headaches, dizziness or paresthesias. Endo:  Denies any problems with DM, thyroid or adrenal function.  PHYSICAL EXAM: BP (!) 108/58   Pulse 67   Ht '5\' 6"'$  (1.676 m)   Wt 184 lb (83.5 kg)   BMI 29.70 kg/m   General: 81 year old male fatigued appearing in no acute distress. Head: Normocephalic and atraumatic. Eyes:  Sclerae non-icteric, conjunctive pink. Ears: Normal auditory acuity. Mouth: No ulcers or lesions.  Neck: Supple, no lymphadenopathy or thyromegaly.  Lungs: Clear bilaterally to auscultation without wheezes, crackles or rhonchi. Heart: Regular rate and rhythm. Systolic murmur, rub or gallop appreciated.  Abdomen: Soft, nontender, non distended. No masses. No hepatosplenomegaly. Normoactive bowel sounds x 4 quadrants.  Rectal: Deferred. Musculoskeletal: Symmetrical with no gross deformities. Skin: Warm and dry. No rash or lesions on visible extremities. Extremities: No edema. Neurological: Alert oriented x 4, no focal deficits.  Psychological:  Alert and cooperative. Normal mood and affect.  ASSESSMENT AND PLAN:  64) 81 year old male admitted to the [facility] 01/02/2022 with UGI bleed melena. Chronic NSAID use. Admission Hg 11.8 (baseline Hg 13.2). S/P EGD by Dr. Allen Norris which showed a normal esophagus, erosive gastropathy without stigmata of recent bleeding, nonbleeding gastric ulcers with pigmented material and nonbleeding duodenal ulcer with a flat pigmented spot.  Biopsies were not obtained.  He was discharged home on Pantoprazole 40 mg once daily on 01/03/2022.  No further melenic stools. -Continue Pantoprazole 40 mg once daily -EGD to assess for ulcer healing December 2023.   -Patient instructed to go to the emergency room if he passes frequent loose black tarry stools  2) Chronic mild normocytic  anemia -CBC, IBC + Ferritin   3) Prostate cancer  with bone metastasis   4) History tubulovillous, sessile serrated and tubular adenomatous polyps per colonoscopies in 2014. Follow up colonoscopy 03/22/2014 without evidence of residual tubulovillous adenoma, no dysplasia. -No further colon polyp surveillance colonoscopies recommended at the age of 35 with prostate cancer/bone mets.   5) Constipation, chronic narcotic use a contributing factor. -Benefiber 1 tablespoon daily -Add MiraLAX nightly as needed    CC:  Ria Bush, MD

## 2022-02-13 ENCOUNTER — Other Ambulatory Visit: Payer: Self-pay | Admitting: Oncology

## 2022-02-13 DIAGNOSIS — C7951 Secondary malignant neoplasm of bone: Secondary | ICD-10-CM

## 2022-02-24 ENCOUNTER — Other Ambulatory Visit: Payer: Self-pay | Admitting: Family Medicine

## 2022-02-24 NOTE — Telephone Encounter (Signed)
Name of Medication: Temazepam Name of Pharmacy: [facility] or Written Date and Quantity: 01/28/22, #30 Last Office Visit and Type: 01/10/22, hosp f/u Next Office Visit and Type: none Last Controlled Substance Agreement Date: none Last UDS: none

## 2022-02-25 NOTE — Telephone Encounter (Signed)
ERx 

## 2022-02-25 NOTE — Telephone Encounter (Signed)
Patient call and stated the medication the temazepam (RESTORIL) 30 MG capsule was rejected at the pharmacy.

## 2022-02-25 NOTE — Telephone Encounter (Signed)
Spoke with pt to get clarification of message.  States he went to pharmacy today for refill and was told it was rejected.  I explained he last got it filled 01/28/22 and his insurance co will only allow a refill every 30 day so he's just requesting it too early.  Pt verbalizes understanding and will call the pharmacy back on 02/27/22.

## 2022-03-02 ENCOUNTER — Emergency Department: Payer: No Typology Code available for payment source

## 2022-03-02 ENCOUNTER — Emergency Department
Admission: EM | Admit: 2022-03-02 | Discharge: 2022-03-03 | Disposition: A | Discharge status: Home / Self Care | Payer: No Typology Code available for payment source | Attending: Emergency Medicine | Admitting: Emergency Medicine

## 2022-03-02 ENCOUNTER — Other Ambulatory Visit: Payer: Self-pay

## 2022-03-02 DIAGNOSIS — Z8546 Personal history of malignant neoplasm of prostate: Secondary | ICD-10-CM | POA: Diagnosis not present

## 2022-03-02 DIAGNOSIS — R4689 Other symptoms and signs involving appearance and behavior: Secondary | ICD-10-CM

## 2022-03-02 DIAGNOSIS — Z046 Encounter for general psychiatric examination, requested by authority: Secondary | ICD-10-CM | POA: Diagnosis present

## 2022-03-02 DIAGNOSIS — Z1152 Encounter for screening for COVID-19: Secondary | ICD-10-CM | POA: Diagnosis not present

## 2022-03-02 DIAGNOSIS — I2510 Atherosclerotic heart disease of native coronary artery without angina pectoris: Secondary | ICD-10-CM | POA: Diagnosis not present

## 2022-03-02 DIAGNOSIS — R45851 Suicidal ideations: Secondary | ICD-10-CM

## 2022-03-02 DIAGNOSIS — I10 Essential (primary) hypertension: Secondary | ICD-10-CM | POA: Diagnosis not present

## 2022-03-02 DIAGNOSIS — F4325 Adjustment disorder with mixed disturbance of emotions and conduct: Secondary | ICD-10-CM | POA: Diagnosis not present

## 2022-03-02 LAB — CBC
HCT: 36.5 % — ABNORMAL LOW (ref 39.0–52.0)
Hemoglobin: 12.5 g/dL — ABNORMAL LOW (ref 13.0–17.0)
MCH: 30.8 pg (ref 26.0–34.0)
MCHC: 34.2 g/dL (ref 30.0–36.0)
MCV: 89.9 fL (ref 80.0–100.0)
Platelets: 203 10*3/uL (ref 150–400)
RBC: 4.06 MIL/uL — ABNORMAL LOW (ref 4.22–5.81)
RDW: 13.1 % (ref 11.5–15.5)
WBC: 6.1 10*3/uL (ref 4.0–10.5)
nRBC: 0 % (ref 0.0–0.2)

## 2022-03-02 LAB — URINE DRUG SCREEN, QUALITATIVE (ARMC ONLY)
Amphetamines, Ur Screen: NOT DETECTED
Barbiturates, Ur Screen: NOT DETECTED
Benzodiazepine, Ur Scrn: POSITIVE — AB
Cannabinoid 50 Ng, Ur ~~LOC~~: NOT DETECTED
Cocaine Metabolite,Ur ~~LOC~~: NOT DETECTED
MDMA (Ecstasy)Ur Screen: NOT DETECTED
Methadone Scn, Ur: NOT DETECTED
Opiate, Ur Screen: POSITIVE — AB
Phencyclidine (PCP) Ur S: NOT DETECTED
Tricyclic, Ur Screen: NOT DETECTED

## 2022-03-02 LAB — COMPREHENSIVE METABOLIC PANEL
ALT: 8 U/L (ref 0–44)
AST: 18 U/L (ref 15–41)
Albumin: 4.1 g/dL (ref 3.5–5.0)
Alkaline Phosphatase: 52 U/L (ref 38–126)
Anion gap: 7 (ref 5–15)
BUN: 17 mg/dL (ref 8–23)
CO2: 23 mmol/L (ref 22–32)
Calcium: 9.5 mg/dL (ref 8.9–10.3)
Chloride: 112 mmol/L — ABNORMAL HIGH (ref 98–111)
Creatinine, Ser: 1.14 mg/dL (ref 0.61–1.24)
GFR, Estimated: >60 mL/min (ref >60)
Glucose, Bld: 124 mg/dL — ABNORMAL HIGH (ref 70–99)
Potassium: 3.9 mmol/L (ref 3.5–5.1)
Sodium: 142 mmol/L (ref 135–145)
Total Bilirubin: 0.9 mg/dL (ref 0.3–1.2)
Total Protein: 6.9 g/dL (ref 6.5–8.1)

## 2022-03-02 LAB — ETHANOL: Alcohol, Ethyl (B): <10 mg/dL (ref <10)

## 2022-03-02 LAB — SALICYLATE LEVEL: Salicylate Lvl: <7 mg/dL — ABNORMAL LOW (ref 7.0–30.0)

## 2022-03-02 LAB — ACETAMINOPHEN LEVEL: Acetaminophen (Tylenol), Serum: <10 ug/mL — ABNORMAL LOW (ref 10–30)

## 2022-03-02 LAB — RESP PANEL BY RT-PCR (FLU A&B, COVID) ARPGX2
Influenza A by PCR: NEGATIVE
Influenza B by PCR: NEGATIVE
SARS Coronavirus 2 by RT PCR: NEGATIVE

## 2022-03-02 MED ORDER — PREDNISONE 10 MG PO TABS
5.0000 mg | ORAL_TABLET | Freq: Every day | ORAL | Status: DC
  Administered 2022-03-03: 5 mg via ORAL
  Filled 2022-03-02: qty 1

## 2022-03-02 MED ORDER — TRAZODONE HCL 50 MG PO TABS
50.0000 mg | ORAL_TABLET | Freq: Every day | ORAL | Status: DC
  Administered 2022-03-02: 50 mg via ORAL
  Filled 2022-03-02: qty 1

## 2022-03-02 MED ORDER — PANTOPRAZOLE SODIUM 40 MG PO TBEC
40.0000 mg | DELAYED_RELEASE_TABLET | Freq: Every morning | ORAL | Status: DC
  Administered 2022-03-03: 40 mg via ORAL
  Filled 2022-03-02: qty 1

## 2022-03-02 MED ORDER — GABAPENTIN 300 MG PO CAPS
300.0000 mg | ORAL_CAPSULE | Freq: Every day | ORAL | Status: DC
  Administered 2022-03-02: 300 mg via ORAL
  Filled 2022-03-02: qty 1

## 2022-03-02 MED ORDER — ONDANSETRON HCL 4 MG PO TABS: 4.0000 mg | ORAL_TABLET | Freq: Every day | ORAL | Status: DC | PRN

## 2022-03-02 MED ORDER — HYDROCODONE-ACETAMINOPHEN 5-325 MG PO TABS
1.0000 | ORAL_TABLET | Freq: Three times a day (TID) | ORAL | Status: DC | PRN
  Administered 2022-03-02: 1 via ORAL
  Filled 2022-03-02: qty 1

## 2022-03-02 MED ORDER — CYANOCOBALAMIN 500 MCG PO TABS
1000.0000 ug | ORAL_TABLET | Freq: Every day | ORAL | Status: DC
  Administered 2022-03-02 – 2022-03-03 (×2): 1000 ug via ORAL
  Filled 2022-03-02 (×2): qty 2

## 2022-03-02 MED ORDER — TEMAZEPAM 15 MG PO CAPS
30.0000 mg | ORAL_CAPSULE | Freq: Every day | ORAL | Status: DC
  Administered 2022-03-02: 30 mg via ORAL
  Filled 2022-03-02: qty 2

## 2022-03-02 MED ORDER — VITAMIN D 25 MCG (1000 UNIT) PO TABS
2000.0000 [IU] | ORAL_TABLET | Freq: Every day | ORAL | Status: DC
  Administered 2022-03-02 – 2022-03-03 (×2): 2000 [IU] via ORAL
  Filled 2022-03-02 (×2): qty 2

## 2022-03-02 MED ORDER — ABIRATERONE ACETATE 250 MG PO TABS: 1000.0000 mg | ORAL_TABLET | Freq: Every day | ORAL | Status: DC

## 2022-03-02 MED ORDER — AMLODIPINE BESYLATE 5 MG PO TABS
5.0000 mg | ORAL_TABLET | Freq: Every day | ORAL | Status: DC
  Administered 2022-03-03: 5 mg via ORAL
  Filled 2022-03-02: qty 1

## 2022-03-02 MED ORDER — LISINOPRIL 5 MG PO TABS
5.0000 mg | ORAL_TABLET | Freq: Every day | ORAL | Status: DC
  Administered 2022-03-03: 5 mg via ORAL
  Filled 2022-03-02: qty 1

## 2022-03-02 MED ORDER — OMEGA-3-ACID ETHYL ESTERS 1 G PO CAPS
2.0000 g | ORAL_CAPSULE | Freq: Every day | ORAL | Status: DC
  Administered 2022-03-02 – 2022-03-03 (×2): 2 g via ORAL
  Filled 2022-03-02 (×2): qty 2

## 2022-03-02 MED ORDER — TRAMADOL HCL 50 MG PO TABS: 50.0000 mg | ORAL_TABLET | Freq: Two times a day (BID) | ORAL | Status: DC | PRN

## 2022-03-02 NOTE — ED Notes (Signed)
Patient transported to CT with security
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
## 2022-03-02 NOTE — ED Notes (Signed)
Pt's cousin, Merrily Pew called, updated with pt's permission. Name and number added to demographics. Please call on DC as she will be picking up pt. Merrily Pew [phone]

## 2022-03-02 NOTE — Progress Notes (Signed)
Prior-To-Admission Oral Chemotherapy for Treatment of Oncologic Disease   Order noted from Dr. Ellender Hose to continue prior-to-admission oral chemotherapy regimen of abiraterone.  Procedure Per Pharmacy & Therapeutics Committee Policy: Orders for continuation of home oral chemotherapy for treatment of an oncologic disease will be held unless approved by an oncologist during current admission.    For patients receiving oncology care at [facility], inpatient pharmacist contacts patient's oncologist during regular office hours to review. If earlier review is medically necessary, attending physician consults [facility] on-call oncologist  Oral chemotherapy continuation order is on hold pending oncologist review, [facility] oncologist Dr. Delight Hoh will be notified by inpatient pharmacy during office hours   Lorna Dibble 03/02/2022, 3:26 PM

## 2022-03-02 NOTE — ED Notes (Signed)
Message sent to pharmacy for missing meds

## 2022-03-02 NOTE — ED Notes (Signed)
Pt's family member visited and was concerned about pt being here, Pt's family member, Tacey Heap states that pt can be picked up by her and can stay at her house.  Tacey Heap home # [phone] Cell: [phone]

## 2022-03-02 NOTE — ED Notes (Signed)
Pt given meal tray and water, pt asked for diet coke

## 2022-03-02 NOTE — ED Notes (Signed)
Full rainbow sent to lab including covid swab.

## 2022-03-02 NOTE — ED Notes (Signed)
Pt given drink, pt offered lunch tray, pt states he is not hungry at this time.

## 2022-03-02 NOTE — ED Provider Notes (Signed)
[facility] Provider Note    Event Date/Time   First MD Initiated Contact with Patient 03/02/22 1256     (approximate)   History   Altered Mental Status   HPI  Justin Ali is a 81 y.o. male  with history of  HTN, HLD, CAD, prostate CA, here with AMS. Per report, pt had police called ot his house yesterday after an aggressive verbal altercation with his wife. He states the police told him he needed to "pack his things and leave" so he went to his aunt's for the night. When he returned today he says police were waiting at his house to bring him here. Per report from the IVC paperwork, pt actually called PD accusing them of stealing his guns and being out against him. They assessed him and felt he was confused enough to be concerning for possible dementia/psychiatric issue so he arrives under IVC. Pt reports this is all because his wife is "out for money." Reports that he remembers every detail of the event and that the PD are being purposefully misleading.      Physical Exam   Triage Vital Signs: ED Triage Vitals  Enc Vitals Group     BP 03/02/22 1236 132/74     Pulse Rate 03/02/22 1236 99     Resp 03/02/22 1236 18     Temp 03/02/22 1236 98.5 F (36.9 C)     Temp Source 03/02/22 1236 Oral     SpO2 03/02/22 1236 93 %     Weight 03/02/22 1305 180 lb 12.4 oz (82 kg)     Height 03/02/22 1305 '5\' 6"'$  (1.676 m)     Head Circumference --      Peak Flow --      Pain Score 03/02/22 1305 0     Pain Loc --      Pain Edu? --      Excl. in [place]? --     Most recent vital signs: Vitals:   03/02/22 1236  BP: 132/74  Pulse: 99  Resp: 18  Temp: 98.5 F (36.9 C)  SpO2: 93%     General: Awake, no distress.  CV:  Good peripheral perfusion.  Resp:  Normal effort.  Abd:  No distention.  Other:  Oriented to person, place, year. He does appear mildly agitated but is redirected easily. Denies SI, HI. No RTIS or hallucinations.   ED Results / Procedures /  Treatments   Labs (all labs ordered are listed, but only abnormal results are displayed) Labs Reviewed  COMPREHENSIVE METABOLIC PANEL - Abnormal; Notable for the following components:      Result Value   Chloride 112 (*)    Glucose, Bld 124 (*)    All other components within normal limits  SALICYLATE LEVEL - Abnormal; Notable for the following components:   Salicylate Lvl <7.4 (*)    All other components within normal limits  ACETAMINOPHEN LEVEL - Abnormal; Notable for the following components:   Acetaminophen (Tylenol), Serum <10 (*)    All other components within normal limits  CBC - Abnormal; Notable for the following components:   RBC 4.06 (*)    Hemoglobin 12.5 (*)    HCT 36.5 (*)    All other components within normal limits  RESP PANEL BY RT-PCR (FLU A&B, COVID) ARPGX2  ETHANOL  URINE DRUG SCREEN, QUALITATIVE ([facility] ONLY)     EKG    RADIOLOGY CT Head: NAICA   I also independently reviewed and  agree with radiologist interpretations.   PROCEDURES:  Critical Care performed: No   MEDICATIONS ORDERED IN ED: Medications - No data to display   IMPRESSION / MDM / [place] / ED COURSE  I reviewed the triage vital signs and the nursing notes.                             This patient presents to the ED for concern of AMS/agitation, this involves an extensive number of treatment options, and is a complaint that carries with it a high risk of complications and morbidity.  The differential diagnosis includes dementia w/ behavioral disturbance, CNS mass/lesion causing altered behavior in setting of prostate CA, polypharmacy, delirium from UTI, PNA, occult infection   Co morbidities that complicate the patient evaluation  Elderly HTN HLD Metastatic prostate CA   Additional history obtained:  Additional history obtained from IVC paperwork External records from outside source obtained and reviewed including PCP notes, [facility] notes 01/29/2022, GI bleed  admission H&P   Lab Tests:  I Ordered, and personally interpreted labs.  The pertinent results include:  WBC normal at 6.1, Hgb 12.5, BMP unremarkable   Imaging Studies ordered:  I ordered imaging studies including CT head   I independently visualized and interpreted imaging which showed NAICA I agree with the radiologist interpretation   Consultations Obtained:  I requested consultation with the Psych/TTS,  and discussed lab and imaging findings as well as pertinent plan - they recommend: pending   Problem List / ED Course / Critical interventions / Medication management  AMS: Somewhat unclear based on hx. Pt is alert, oriented here but story he is telling me is very different from reported story on IVC paperwork. TTS/PSych consulted and will ask them to obtain collateral w/ PD and wife. Otherwise, no apparent organic etiology. CT Head negative. Labs reassuring. Presentation is not c/f medical encephalopathy. I have reviewed the patients home medicines and have made adjustments as needed   Social Determinants of Health:  Social situation tenuous with wife, PD called for argument yesterday   Test / Admission - Considered:  Pending Psych evaluation. No indication for medical admission at this time.     FINAL CLINICAL IMPRESSION(S) / ED DIAGNOSES   Final diagnoses:  Behavior concern     Rx / DC Orders   ED Discharge Orders     None        Note:  This document was prepared using Dragon voice recognition software and may include unintentional dictation errors.   Duffy Bruce, MD 03/02/22 [phone]

## 2022-03-02 NOTE — ED Notes (Signed)
Pt with flannel shirt, black wallet, sunglasses, cell phone, keys, pen, glasses case, , money clip with cash 7-$100, 10-$20, 2-$10, 1-$5, 1-$1 ( counted with security, bagged and labeled), gold ring, gold watch,  Cash, gold ring, gold watch, wallet, and cell phone taken to hospital safe by security.  Blue jeans, brown belt, white socks, gray sneakers, blue boxers.  Pt belongings bagged and labeled. Pt dressed out into hospital attire and socks.

## 2022-03-03 DIAGNOSIS — F4325 Adjustment disorder with mixed disturbance of emotions and conduct: Secondary | ICD-10-CM

## 2022-03-03 NOTE — ED Notes (Signed)
Laverne here to pick up patient

## 2022-03-03 NOTE — ED Notes (Signed)
Justin Ali, patient family member coming to get patient.

## 2022-03-03 NOTE — ED Provider Notes (Signed)
Emergency Medicine Observation Re-evaluation Note  Justin Ali is a 81 y.o. male, seen on rounds today.  Pt initially presented to the ED for complaints of Altered Mental Status Currently, the patient is resting comfortably.  Physical Exam  BP 134/86 (BP Location: Left Arm)   Pulse 90   Temp 98.7 F (37.1 C) (Oral)   Resp 18   Ht '5\' 6"'$  (1.676 m)   Wt 82 kg   SpO2 97%   BMI 29.18 kg/m  Physical Exam General: No acute distress Cardiac: Well-perfused extremities Lungs: No respiratory distress Psych: Appropriate mood and affect  ED Course / MDM  EKG:   I have reviewed the labs performed to date as well as medications administered while in observation.  Recent changes in the last 24 hours include psychiatric evaluation.  Plan  Current plan is for reassessment this morning.    Naaman Plummer, MD 03/03/22 [phone]
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
## 2022-03-03 NOTE — ED Notes (Signed)
IVC/ Reassess in AM
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
## 2022-03-04 ENCOUNTER — Encounter: Payer: Self-pay | Admitting: Oncology

## 2022-03-12 ENCOUNTER — Telehealth: Payer: Self-pay | Admitting: Family Medicine

## 2022-03-12 NOTE — Telephone Encounter (Addendum)
Spoke with pt's wife, Hope (on dpr), to get clarification of message.  States she just got off the phn with a [place] to get things set up for pt. Nothing else needed at this time.
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
## 2022-03-12 NOTE — Telephone Encounter (Signed)
Patient wife called to see if Dr. Darnell Level get him seen for somewhere for his alzheimer's and what can be done for him. Call back  number [phone].

## 2022-03-13 NOTE — Telephone Encounter (Signed)
Lvm asking pt's wife, Hope (on dpr), to call back.  Need to relay Dr. Synthia Innocent message. If pt agrees to come in, Dr. Darnell Level has scheduled OV for pt at 3:00 tomorrow.

## 2022-03-13 NOTE — Telephone Encounter (Signed)
Pt's wife, rtn call.  I relayed Dr. Synthia Innocent message.  She verbalizes understanding but states pt is not with her to speak with.  However, she agrees to OV tomorrow at 3:00 and state either she or their granddaughter, Earnest Bailey, will bring pt.

## 2022-03-13 NOTE — Telephone Encounter (Addendum)
I'm sorry they're going through this difficult situation - unfortunately no good options.  First and foremost is ensure safety of patient and of wife so she should to do whatever she needs to to feel safe.  Long term placement would require him to be on board with this and would take time to set up.  Similarly any medication would presume he is in agreement with taking medication and would take time to take effect.  I recommend OV tomorrow for further evaluation. Plz speak with wife and then with patient separately. See if wife feels he's safe to come into the office then see if husband agrees to come in to see if any medical cause to change in his behavior.

## 2022-03-14 ENCOUNTER — Ambulatory Visit (INDEPENDENT_AMBULATORY_CARE_PROVIDER_SITE_OTHER): Payer: No Typology Code available for payment source | Admitting: Family Medicine

## 2022-03-14 ENCOUNTER — Encounter: Payer: Self-pay | Admitting: Family Medicine

## 2022-03-14 VITALS — BP 120/70 | HR 68 | Temp 97.7°F | Ht 66.0 in | Wt 184.6 lb

## 2022-03-14 DIAGNOSIS — R4689 Other symptoms and signs involving appearance and behavior: Secondary | ICD-10-CM

## 2022-03-14 DIAGNOSIS — E538 Deficiency of other specified B group vitamins: Secondary | ICD-10-CM

## 2022-03-14 DIAGNOSIS — Z23 Encounter for immunization: Secondary | ICD-10-CM | POA: Diagnosis not present

## 2022-03-14 DIAGNOSIS — C799 Secondary malignant neoplasm of unspecified site: Secondary | ICD-10-CM

## 2022-03-14 DIAGNOSIS — R413 Other amnesia: Secondary | ICD-10-CM

## 2022-03-14 DIAGNOSIS — H0220C Unspecified lagophthalmos, bilateral, upper and lower eyelids: Secondary | ICD-10-CM

## 2022-03-14 DIAGNOSIS — F5104 Psychophysiologic insomnia: Secondary | ICD-10-CM

## 2022-03-14 DIAGNOSIS — C61 Malignant neoplasm of prostate: Secondary | ICD-10-CM

## 2022-03-14 DIAGNOSIS — C7951 Secondary malignant neoplasm of bone: Secondary | ICD-10-CM

## 2022-03-14 MED ORDER — CYANOCOBALAMIN 1000 MCG/ML IJ SOLN
1000.0000 ug | Freq: Once | INTRAMUSCULAR | Status: AC
  Administered 2022-03-14: 1000 ug via INTRAMUSCULAR

## 2022-03-14 NOTE — Patient Instructions (Addendum)
Flu shot today Labs then B12 shot today.  Memory testing today concerning for memory difficulties ?dementia.  I recommend brain MRI for further evaluation. Let me know who to contact at the [place] with my recommendations.  We may consider medication for this - will be in touch with results.  Ensure you're taking pantoprazole for stomach.

## 2022-03-14 NOTE — Progress Notes (Signed)
Patient ID: Justin Ali, male    DOB: 08/23/1940, 81 y.o.   MRN: [id number]  This visit was conducted in person.  BP 120/70   Pulse 68   Temp 97.7 F (36.5 C) (Temporal)   Ht '5\' 6"'$  (1.676 m)   Wt 184 lb 9.6 oz (83.7 kg)   SpO2 96%   BMI 29.80 kg/m    CC: ER f/u visit, behavioral changes Subjective:   HPI: Justin Ali is a 81 y.o. male presenting on 03/14/2022 for Hospitalization Follow-up (Seen on 03/02/22 at [facility] ED, dx behavior concern; suicidal ideation.  Pt accompanied by granddaughter, Justin Ali receives care through [facility], possibly Dr Corky Sox.  See recent ER notes and mychart message from wife for details.  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  Recent altercation with wife, police were called, he was taken to ER under IVC, records reviewed. Discharged after psychiatric evaluation and IVC revoked.  He doesn't remember specifics of this, states he never threatened his wife which conflicts wife's story. Head CT at that time (02/2022) without acute abnormality, noted stable sclerotic bone mets to skull and upper cervical spine.   He denies SI/HI. He does not have acces to guns - states cops took his guns.  Stayed with his aunt for a short while, most recently he's been staying at [facility] but he desires to return home, and wife has been in agreement.   No fevers/chills, weight changes, headache, dizziness or vertigo, slurred speech.  He sometimes has  difficulty answering questions and asks grand daughter for corroboration of some of the story - ie initially states he's been staying with grand daughter however she has just driven into the area and lives 3 hours away.    Recent hospitalization for melena in setting of UGI bleed and h/o gastric ulcers, EGD ([name]) showed erosive gastropathy. Saw GI in follow up - plan was rpt EGD 03/2022 to assess for ulcer healing. Unsure if he's been taking pantoprazole daily - advised to verify at home.   Geriatric Assessment: Activities of Daily Living:     Bathing- independent    Dressing- independent    Eating- independent    Toileting-  independent    Transferring- independent    Continence- independent Overall Assessment: Independent  Instrumental Activities of Daily Living:     Transportation- independent    Meal/Food Preparation- dependent    Shopping Errands- independent     Housekeeping/Chores- independent    Money Management/Finances- dependent    Medication Management- independent    Ability to Use Telephone- independent    Laundry- dependent Overall Assessment: largely independent  Mental Status Exam: 19/30(value/max value)     Clock Drawing Score: 2/4      Relevant past medical, surgical, family and social history reviewed and updated as indicated. Interim medical history since our last visit reviewed. Allergies and medications reviewed and updated. Outpatient Medications Prior to Visit  Medication Sig Dispense Refill   abiraterone acetate (ZYTIGA) 250 MG tablet Take 4 tablets (1,000 mg total) by mouth daily. Take on an empty stomach 1 hour before  or 2 hours after a meal 120 tablet 5   amLODipine (NORVASC) 5 MG tablet Take 1 tablet (5 mg total) by mouth daily. 90 tablet 3   CALCIUM PO Take 1,200 mg by mouth daily.     cholecalciferol (VITAMIN D) 1000 UNITS tablet Take 2,000 Units by mouth daily.     clotrimazole (LOTRIMIN) 1 % cream Apply 1 application topically 2 (two) times  daily. For 2 weeks or until full resolution 30 g 0   gabapentin (NEURONTIN) 300 MG capsule Take 1 capsule (300 mg total) by mouth at bedtime. 90 capsule 3   HYDROcodone-acetaminophen (NORCO/VICODIN) 5-325 MG tablet Take 1 tablet by mouth 3 (three) times daily as needed for moderate pain. 90 tablet 0   lisinopril (PRINIVIL,ZESTRIL) 5 MG tablet Take 1 tablet (5 mg total) by mouth daily. 90 tablet 3   Multiple Vitamins-Minerals (CENTRUM SILVER 50+MEN) TABS Take by mouth daily.     Omega-3 Fatty Acids (FISH OIL) 1000 MG CAPS Take 2 capsules (2,000 mg total) by mouth daily.     ondansetron (ZOFRAN) 4 MG tablet Take 1 tablet (4 mg total) by mouth daily as needed for nausea or vomiting. 30 tablet 1   pantoprazole (PROTONIX) 40 MG tablet Take 1 tablet (40 mg total) by mouth every morning. 30 tablet 3   predniSONE (DELTASONE) 5 MG tablet Take 1 tablet (5 mg total) by mouth daily with breakfast. 30 tablet 5   temazepam (RESTORIL) 30 MG capsule Take 1 capsule (30 mg total) by mouth at bedtime. 30 capsule 3   traMADol (ULTRAM) 50 MG tablet Take 1 tablet (50 mg total) by mouth 2 (two) times daily as needed. 30 tablet 0   traZODone (DESYREL) 50 MG tablet Take 1 tablet (50 mg total) by mouth at bedtime. 90 tablet 3   vitamin B-12 (CYANOCOBALAMIN) 1000 MCG tablet Take 1 tablet (1,000 mcg total) by mouth daily.     No facility-administered medications prior to visit.     Per HPI unless specifically indicated in ROS section below Review of Systems  Objective:  BP 120/70   Pulse 68   Temp 97.7 F (36.5 C) (Temporal)   Ht '5\' 6"'$  (1.676 m)   Wt 184 lb 9.6 oz (83.7 kg)   SpO2 96%   BMI 29.80 kg/m   Wt Readings from Last 3 Encounters:  03/14/22 184 lb 9.6 oz (83.7 kg)  03/02/22 180 lb 12.4 oz (82 kg)  01/29/22 184 lb (83.5 kg)      Physical Exam Vitals and nursing note reviewed.  Constitutional:      Appearance: Normal appearance. He is not ill-appearing.  Neurological:     Mental Status: He is  alert.  Psychiatric:        Mood and Affect: Mood normal.        Behavior: Behavior normal.       Results for orders placed or performed in visit on 03/14/22  TSH  Result Value Ref Range   TSH 0.39 (L) 0.40 - 4.50 mIU/L  Vitamin B12  Result Value Ref Range   Vitamin B-12 >2,000 (H) 200 - 1,100 pg/mL   Lab Results  Component Value Date   CREATININE 1.14 03/02/2022   BUN 17 03/02/2022   NA 142 03/02/2022   K 3.9 03/02/2022   CL 112 (H) 03/02/2022   CO2 23 03/02/2022    Lab Results  Component Value Date   WBC 6.1 03/02/2022   HGB 12.5 (L) 03/02/2022   HCT 36.5 (  L) 03/02/2022   MCV 89.9 03/02/2022   PLT 203 03/02/2022    MRI HEAD WITHOUT AND WITH CONTRAST 05/30/2021 TECHNIQUE: Multiplanar, multiecho pulse sequences of the brain and surrounding structures were obtained without and with intravenous contrast. CONTRAST:  76m GADAVIST GADOBUTROL 1 MMOL/ML IV SOLN COMPARISON:  Head CT 05/30/2021.  Orbit CT 03/15/2008. FINDINGS: Brain: There is no evidence of an acute infarct, intracranial hemorrhage, mass, midline shift, or extra-axial fluid collection. T2 hyperintensities in the cerebral white matter bilaterally are nonspecific but compatible with mild-to-moderate chronic small vessel ischemic disease. There is mild-to-moderate cerebral atrophy. No abnormal enhancement is identified.   Vascular: Major intracranial vascular flow voids are preserved.   Skull and upper cervical spine: There is sheet like/infiltrative T2 hyperintense, avidly enhancing soft tissue in the left frontal scalp with erosion of the outer table of the left frontal skull as seen on CT. This extends inferiorly into the left lateral periorbital soft tissues and into the lateral aspect of the left orbit, inseparable from the lacrimal gland, and extends to the skin in this region. This appears to be longstanding, as abnormal soft tissue was also present in these regions on the 2009 CT of the orbits. There  are scattered sclerotic lesions in the skull and included upper cervical spine which do not demonstrate substantial enhancement.   Sinuses/Orbits: Bilateral cataract extraction. Clear paranasal sinuses. Trace right mastoid fluid. Other: None. IMPRESSION: 1. No evidence of acute intracranial abnormality or intracranial metastatic disease. 2. 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 3. Abnormal enhancing soft tissue in the left periorbital region and left frontal scalp with erosion of the outer table of the left frontal skull which is longstanding based on a 2009 CT. Per the electronic medical record, the patient has a history of a neurofibroma of the lateral periorbital area, and the appearance on MRI would be more consistent with this type of entity rather than a prostate cancer metastasis. 4. Scattered sclerotic lesions in the skull and upper cervical spine consistent with known prostate cancer metastases.  Electronically Signed   By: ALogan BoresM.D.   On: 05/30/2021 14:52  Assessment & Plan:   Problem List Items Addressed This Visit     Primary malignant 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 ([facility]    H/o this followed by oncology, known sclerotic mets to bone including skull.  On Zytiga '1000mg'$  daily.       Chronic insomnia    Chronic, stable period on temazepam '30mg'$  and trazodone '50mg'$  nightly.  He is also on prn hydrocodone through oncology.       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       Metastasis from malignant neoplasm of prostate (HCC)   Low serum vitamin B12    Levels high - will need to verify if this was drawn before or after B12 shot. Will recommend hold oral b12 at this time.  ?B12 over-treatment contributing to increased agitation.       Memory deficit - Primary    MMSE 19/30, CDT 2/4, independent in  ADLs and most IADLs.  Concern for memory disturbance, possible dementia, associated with behavioral changes including aggression towards wife that he then doesn't remember - this is affecting safety at home for him and his wife. He is planning to return home to his wife, denies SI/HI.  No fever, no headache,  not consistent with infection.  In h/o metastatic prostate cancer, recommend repeat brain imaging with brain MRI w/ and w/o contrast. Pt states he would want to complete this through the [place] - they will try to find who I can contact at the [place] to discuss possible imaging vs further evaluation.  In interim, avoid aricept in recent GI bleed history, avoid namenda given possible worsening agitation. Consider increased trazodone frequency.  Discussed possible referral to neurology/neuro-oncology.       Relevant Orders   RPR   TSH (Completed)   Vitamin B12 (Completed)   Other Visit Diagnoses     Need for influenza vaccination       Relevant Orders   Flu Vaccine QUAD High Dose(Fluad) (Completed)   Behavioral change            Meds ordered this encounter  Medications   cyanocobalamin (VITAMIN B12) injection 1,000 mcg   traZODone (DESYREL) 50 MG tablet    Sig: Take 1 tablet (50 mg total) by mouth 2 (two) times daily.    Dispense:  180 tablet    Refill:  1    Note new sig   Orders Placed This Encounter  Procedures   Flu Vaccine QUAD High Dose(Fluad)   RPR   TSH   Vitamin B12     Patient Instructions  Flu shot today Labs then B12 shot today.  Memory testing today concerning for memory difficulties ?dementia.  I recommend brain MRI for further evaluation. Let me know who to contact at the [place] with my recommendations.  We may consider medication for this - will be in touch with results.  Ensure you're taking pantoprazole for stomach.   Follow up plan: No follow-ups on file.  Ria Bush, MD

## 2022-03-15 ENCOUNTER — Encounter: Payer: Self-pay | Admitting: Family Medicine

## 2022-03-15 DIAGNOSIS — R413 Other amnesia: Secondary | ICD-10-CM | POA: Insufficient documentation

## 2022-03-15 MED ORDER — TRAZODONE HCL 50 MG PO TABS: 50.0000 mg | ORAL_TABLET | Freq: Two times a day (BID) | ORAL | 1 refills | Status: DC

## 2022-03-15 NOTE — Assessment & Plan Note (Addendum)
Chronic, stable period on temazepam '30mg'$  and trazodone '50mg'$  nightly.  He is also on prn hydrocodone through oncology.

## 2022-03-15 NOTE — Assessment & Plan Note (Addendum)
Levels high - will need to verify if this was drawn before or after B12 shot. Will recommend hold oral b12 at this time.  ?B12 over-treatment contributing to increased agitation.

## 2022-03-15 NOTE — Assessment & Plan Note (Addendum)
MMSE 19/30, CDT 2/4, independent in ADLs and most IADLs.  Concern for memory disturbance, possible dementia, associated with behavioral changes including aggression towards wife that he then doesn't remember - this is affecting safety at home for him and his wife. He is planning to return home to his wife, denies SI/HI.  No fever, no headache, not consistent with infection.  In h/o metastatic prostate cancer, recommend repeat brain imaging with brain MRI w/ and w/o contrast. Pt states he would want to complete this through the [place] - they will try to find who I can contact at the [place] to discuss possible imaging vs further evaluation.  In interim, avoid aricept in recent GI bleed history, avoid namenda given possible worsening agitation. Consider increased trazodone frequency.  Discussed possible referral to neurology/neuro-oncology.

## 2022-03-15 NOTE — Assessment & Plan Note (Addendum)
H/o this followed by oncology, known sclerotic mets to bone including skull.  On Zytiga '1000mg'$  daily.

## 2022-03-15 NOTE — Assessment & Plan Note (Addendum)
S/p multiple eyelid surgeries through [facility] and community [facility] surgeons, especially L upper eyelid.

## 2022-03-16 ENCOUNTER — Encounter: Payer: Self-pay | Admitting: Family Medicine

## 2022-03-16 DIAGNOSIS — R413 Other amnesia: Secondary | ICD-10-CM

## 2022-03-16 DIAGNOSIS — R4189 Other symptoms and signs involving cognitive functions and awareness: Secondary | ICD-10-CM

## 2022-03-16 DIAGNOSIS — R4689 Other symptoms and signs involving appearance and behavior: Secondary | ICD-10-CM

## 2022-03-16 DIAGNOSIS — C61 Malignant neoplasm of prostate: Secondary | ICD-10-CM

## 2022-03-17 ENCOUNTER — Encounter: Payer: Self-pay | Admitting: Family Medicine

## 2022-03-17 NOTE — Telephone Encounter (Signed)
Spoke to patient's wife to clarify the message. Patient's wife Justin Ali stated that Dr. Danise Mina wants her husband to have a MRI done. Justin Ali stated that her husband wants to have the MRI done at the [place]. Justin Ali stated that she does not  know why because her husband has insurance. Justin Ali stated if he is going to have it at the [place] they need to know what kind of MRI and what for.

## 2022-03-18 DIAGNOSIS — R4189 Other symptoms and signs involving cognitive functions and awareness: Secondary | ICD-10-CM | POA: Insufficient documentation

## 2022-03-18 NOTE — Telephone Encounter (Signed)
See other mychart message with my response/MRI order.

## 2022-03-18 NOTE — Telephone Encounter (Signed)
Left a message for patient's wife to reach out to [place] with a fax number. Mychart message was also sent to patient's wife.

## 2022-03-18 NOTE — Telephone Encounter (Signed)
I've placed external order for brain MRI - will print out and see if we can get it faxed to the [facility] to see if they will approve it.

## 2022-03-19 LAB — RPR TITER: RPR Titer: 1:1 {titer} — ABNORMAL HIGH

## 2022-03-19 LAB — TSH: TSH: 0.39 mIU/L — ABNORMAL LOW (ref 0.40–4.50)

## 2022-03-19 LAB — RPR: RPR Ser Ql: REACTIVE — AB

## 2022-03-19 LAB — FLUORESCENT TREPONEMAL AB(FTA)-IGG-BLD: Fluorescent Treponemal ABS: REACTIVE — AB

## 2022-03-19 LAB — VITAMIN B12: Vitamin B-12: >2000 pg/mL — ABNORMAL HIGH (ref 200–1100)

## 2022-03-21 ENCOUNTER — Other Ambulatory Visit: Payer: Self-pay | Admitting: Family Medicine

## 2022-03-21 DIAGNOSIS — A530 Latent syphilis, unspecified as early or late: Secondary | ICD-10-CM

## 2022-03-27 ENCOUNTER — Inpatient Hospital Stay: Payer: Medicare Other

## 2022-03-27 ENCOUNTER — Inpatient Hospital Stay: Payer: Medicare Other | Attending: Oncology | Admitting: Oncology

## 2022-03-27 ENCOUNTER — Encounter: Payer: Self-pay | Admitting: Oncology

## 2022-03-27 ENCOUNTER — Other Ambulatory Visit: Payer: Self-pay

## 2022-03-27 VITALS — BP 145/78 | HR 68 | Temp 98.7°F | Resp 16 | Ht 66.0 in | Wt 182.0 lb

## 2022-03-27 DIAGNOSIS — C61 Malignant neoplasm of prostate: Secondary | ICD-10-CM

## 2022-03-27 DIAGNOSIS — C7951 Secondary malignant neoplasm of bone: Secondary | ICD-10-CM | POA: Insufficient documentation

## 2022-03-27 LAB — COMPREHENSIVE METABOLIC PANEL
ALT: 10 U/L (ref 0–44)
AST: 22 U/L (ref 15–41)
Albumin: 4 g/dL (ref 3.5–5.0)
Alkaline Phosphatase: 56 U/L (ref 38–126)
Anion gap: 9 (ref 5–15)
BUN: 13 mg/dL (ref 8–23)
CO2: 25 mmol/L (ref 22–32)
Calcium: 9 mg/dL (ref 8.9–10.3)
Chloride: 107 mmol/L (ref 98–111)
Creatinine, Ser: 1.07 mg/dL (ref 0.61–1.24)
GFR, Estimated: >60 mL/min (ref >60)
Glucose, Bld: 94 mg/dL (ref 70–99)
Potassium: 3.2 mmol/L — ABNORMAL LOW (ref 3.5–5.1)
Sodium: 141 mmol/L (ref 135–145)
Total Bilirubin: 0.4 mg/dL (ref 0.3–1.2)
Total Protein: 7.1 g/dL (ref 6.5–8.1)

## 2022-03-27 LAB — CBC WITH DIFFERENTIAL/PLATELET
Abs Immature Granulocytes: 0.04 10*3/uL (ref 0.00–0.07)
Basophils Absolute: 0 10*3/uL (ref 0.0–0.1)
Basophils Relative: 0 %
Eosinophils Absolute: 0.1 10*3/uL (ref 0.0–0.5)
Eosinophils Relative: 2 %
HCT: 37.1 % — ABNORMAL LOW (ref 39.0–52.0)
Hemoglobin: 12.6 g/dL — ABNORMAL LOW (ref 13.0–17.0)
Immature Granulocytes: 1 %
Lymphocytes Relative: 25 %
Lymphs Abs: 1.2 10*3/uL (ref 0.7–4.0)
MCH: 31.2 pg (ref 26.0–34.0)
MCHC: 34 g/dL (ref 30.0–36.0)
MCV: 91.8 fL (ref 80.0–100.0)
Monocytes Absolute: 0.3 10*3/uL (ref 0.1–1.0)
Monocytes Relative: 6 %
Neutro Abs: 3.4 10*3/uL (ref 1.7–7.7)
Neutrophils Relative %: 66 %
Platelets: 190 10*3/uL (ref 150–400)
RBC: 4.04 MIL/uL — ABNORMAL LOW (ref 4.22–5.81)
RDW: 13.1 % (ref 11.5–15.5)
WBC: 5.1 10*3/uL (ref 4.0–10.5)
nRBC: 0 % (ref 0.0–0.2)

## 2022-03-27 LAB — PSA: Prostatic Specific Antigen: 1.17 ng/mL (ref 0.00–4.00)

## 2022-03-27 MED ORDER — HYDROCODONE-ACETAMINOPHEN 5-325 MG PO TABS: 1.0000 | ORAL_TABLET | Freq: Three times a day (TID) | ORAL | 0 refills | Status: DC | PRN

## 2022-03-27 MED ORDER — DENOSUMAB 120 MG/1.7ML ~~LOC~~ SOLN
120.0000 mg | Freq: Once | SUBCUTANEOUS | Status: AC
  Administered 2022-03-27: 120 mg via SUBCUTANEOUS
  Filled 2022-03-27: qty 1.7
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
## 2022-03-28 ENCOUNTER — Encounter: Payer: Self-pay | Admitting: Oncology

## 2022-04-04 ENCOUNTER — Encounter: Payer: Self-pay | Admitting: Family Medicine

## 2022-04-08 NOTE — Progress Notes (Unsigned)
[facility] History and Physical   Primary Care Physician:  Ria Bush, MD   Reason for Procedure:   Hx gastric ulcers  Plan:    EGD     HPI: Justin Ali is a 81 y.o. male here for f/u gastric and duodenal ulcers as below (October OV)  with a past medical history of hypertension, hyperlipidemia, coronary artery disease, sleep apnea does not use CPAP, metastatic prostate cancer, anemia, gastric ulcer, cecal AVM, tubular adenomatous, tubulovillous adenomatous colon polyps.  Previously known by Dr. [name] in 2014 - 2015.   He presents her office today as referred by Dr. Ria Bush for further evaluation regarding gastric ulcers.  He awakened in the morning on 01/02/2022 and passed a large amount of black tarry stool.  The night before, he noted seeing a small amount of bright red blood on the toilet tissue.  He presented to [facility] ED for further evaluation. He denied having any nausea, vomiting or abdominal pain. Admission Hg 11.8 (baseline Hg 13.2). Not on blood thinners.  He endorsed taking Aleve one tab Q HS for 5+ years. He underwent an EGD by Dr. Allen Norris which showed a normal esophagus, erosive gastropathy without stigmata of recent bleeding, nonbleeding gastric ulcers with pigmented material and nonbleeding duodenal ulcer with a flat pigmented spot.  Biopsies were not obtained.  He did not require blood transfusion.  He did not demonstrate any further active GI bleeding.  He was discharged home 01/03/2022 on Pantoprazole 40 mg once daily.   He remains on Pantoprazole 40 mg once daily.  He has avoided NSAIDs since he was discharged from the hospital.  He denies having any dysphagia or heartburn.  He has intermittent chronic right-sided abdominal/flank pain comes and goes.  He takes hydrocodone 1 tab in the morning and 1 tab in the p.m. which sometimes results in constipation.  He typically passes a bowel movement every 3 days which requires straining.  He takes Dulcolax as  needed which results in passing a large stool and sometimes looser stools.  He drinks less than 64 ounces of water daily.  He underwent a CTAP 09/16/2021 which showed a moderate amount of stool throughout the colon without evidence of intra-abdominal/pelvic metastasis. 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   He has a history of a large ascending tubulovillous adenomatous polyp and multiple tubular adenomatous polyps per colonoscopy in 2014.  A follow-up colonoscopy 03/22/2014 showed an intact polypectomy site to the ascending colon with tattoo present and a small nonbleeding cecal AVM.  Biopsy showed benign colonic mucosa without evidence of residual tubulovillous adenoma or dysplasia.   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   2 mm - non-bleeding 03/18/2013 colonoscopy    CAD (coronary artery disease), native coronary artery 2014   By CT lung (04/2013)    Cataract    Gastric ulcer    HLD (hyperlipidemia)    HTN (hypertension)    Hyperglycemia    NAFLD (nonalcoholic fatty liver disease) 08/2011   by abd [place] with increased LFTs   OSA (obstructive sleep apnea)    did not tolerate CPAP   Other benign neoplasm of connective and other soft tissue of unspecified site    Neurofibroma of lateral periorbital area   Peripheral neuropathy    Personal history of colonic adenomas 08/05/2012   Pneumonia 01/2013   CAP LUL   Restless legs syndrome (RLS)  Sleep apnea    no cpap    Past Surgical History:  Procedure Laterality Date   COLONOSCOPY  07/2012   5 adenomas (tubular, TV, serrated), rec rpt 5 months [name])   COLONOSCOPY  02/2013   residual adenomas, cecal AVM, rec rpt 1 yr [name])   COLONOSCOPY  03/2014   no residual polyp, cecal AVM, rpt 3-4 yrs [name])   ECTROPION REPAIR Bilateral 09/29/2017   Procedure: REPAIR OF ECTROPION, EXTENSIVE UPPER AND LOWER;   Surgeon: Karle Starch, MD;  Location: [place];  Service: Ophthalmology;  Laterality: Bilateral;   ESOPHAGOGASTRODUODENOSCOPY (EGD) WITH PROPOFOL N/A 01/02/2022   normal esophagus, erosive gastropathy without recent bleed, nonbleeding gastric ulcers and duodenal ulcer with flat pigmented spotWohl, [name], MD)   ORIF ANKLE FRACTURE  11/29/2000   Dr. Tamala Julian   PTOSIS REPAIR Bilateral 09/29/2017   Procedure: BLEPHAROPTOSIS REPAIR RESECT EX UPPER;  Surgeon: Karle Starch, MD;  Location: [place];  Service: Ophthalmology;  Laterality: Bilateral;   RECONSTRUCTION OF EYELID Bilateral 05/31/2018   SKIN CANCER EXCISION  1997   left anterior neck    Prior to Admission medications   Medication Sig Start Date End Date Taking? Authorizing Provider  abiraterone acetate (ZYTIGA) 250 MG tablet Take 4 tablets (1,000 mg total) by mouth daily. Take on an empty stomach 1 hour before or 2 hours after a meal 12/23/21   Darl Pikes, RPH-CPP  amLODipine (NORVASC) 5 MG tablet Take 1 tablet (5 mg total) by mouth daily. 06/18/17   Ria Bush, MD  CALCIUM PO Take 1,200 mg by mouth daily.    [provider]  cholecalciferol (VITAMIN D) 1000 UNITS tablet Take 2,000 Units by mouth daily.    [provider]  clotrimazole (LOTRIMIN) 1 % cream Apply 1 application topically 2 (two) times daily. For 2 weeks or until full resolution 07/13/20   Ria Bush, MD  gabapentin (NEURONTIN) 300 MG capsule Take 1 capsule (300 mg total) by mouth at bedtime. 06/18/17   Ria Bush, MD  HYDROcodone-acetaminophen (NORCO/VICODIN) 5-325 MG tablet Take 1 tablet by mouth 3 (three) times daily as needed for moderate pain. 03/27/22   Lloyd Huger, MD  lisinopril (PRINIVIL,ZESTRIL) 5 MG tablet Take 1 tablet (5 mg total) by mouth daily. 06/18/17   Ria Bush, MD  Multiple Vitamins-Minerals (CENTRUM SILVER 50+MEN) TABS Take by mouth daily.    [provider]  Omega-3  Fatty Acids (FISH OIL) 1000 MG CAPS Take 2 capsules (2,000 mg total) by mouth daily. 07/13/20   Ria Bush, MD  ondansetron (ZOFRAN) 4 MG tablet Take 1 tablet (4 mg total) by mouth daily as needed for nausea or vomiting. 01/03/22 01/03/23  Sidney Ace, MD  pantoprazole (PROTONIX) 40 MG tablet Take 1 tablet (40 mg total) by mouth every morning. 01/29/22   Noralyn Pick, NP  predniSONE (DELTASONE) 5 MG tablet Take 1 tablet (5 mg total) by mouth daily with breakfast. 12/23/21   Darl Pikes, RPH-CPP  temazepam (RESTORIL) 30 MG capsule Take 1 capsule (30 mg total) by mouth at bedtime. 02/25/22   Ria Bush, MD  traMADol (ULTRAM) 50 MG tablet Take 1 tablet (50 mg total) by mouth 2 (two) times daily as needed. 09/05/21   Lloyd Huger, MD  traZODone (DESYREL) 50 MG tablet Take 1 tablet (50 mg total) by mouth 2 (two) times daily. 03/15/22   Ria Bush, MD    Current Outpatient Medications  Medication Sig Dispense Refill  abiraterone acetate (ZYTIGA) 250 MG tablet Take 4 tablets (1,000 mg total) by mouth daily. Take on an empty stomach 1 hour before or 2 hours after a meal 120 tablet 5   amLODipine (NORVASC) 5 MG tablet Take 1 tablet (5 mg total) by mouth daily. 90 tablet 3   CALCIUM PO Take 1,200 mg by mouth daily.     cholecalciferol (VITAMIN D) 1000 UNITS tablet Take 2,000 Units by mouth daily.     clotrimazole (LOTRIMIN) 1 % cream Apply 1 application topically 2 (two) times daily. For 2 weeks or until full resolution 30 g 0   gabapentin (NEURONTIN) 300 MG capsule Take 1 capsule (300 mg total) by mouth at bedtime. 90 capsule 3   HYDROcodone-acetaminophen (NORCO/VICODIN) 5-325 MG tablet Take 1 tablet by mouth 3 (three) times daily as needed for moderate pain. 90 tablet 0   lisinopril (PRINIVIL,ZESTRIL) 5 MG tablet Take 1 tablet (5 mg total) by mouth daily. 90 tablet 3   Multiple Vitamins-Minerals (CENTRUM SILVER 50+MEN) TABS Take by mouth daily.     Omega-3  Fatty Acids (FISH OIL) 1000 MG CAPS Take 2 capsules (2,000 mg total) by mouth daily.     ondansetron (ZOFRAN) 4 MG tablet Take 1 tablet (4 mg total) by mouth daily as needed for nausea or vomiting. 30 tablet 1   pantoprazole (PROTONIX) 40 MG tablet Take 1 tablet (40 mg total) by mouth every morning. 30 tablet 3   predniSONE (DELTASONE) 5 MG tablet Take 1 tablet (5 mg total) by mouth daily with breakfast. 30 tablet 5   temazepam (RESTORIL) 30 MG capsule Take 1 capsule (30 mg total) by mouth at bedtime. 30 capsule 3   traMADol (ULTRAM) 50 MG tablet Take 1 tablet (50 mg total) by mouth 2 (two) times daily as needed. 30 tablet 0   traZODone (DESYREL) 50 MG tablet Take 1 tablet (50 mg total) by mouth 2 (two) times daily. 180 tablet 1   No current facility-administered medications for this visit.    Allergies as of 04/09/2022   (No Known Allergies)    Family History  Problem Relation Age of Onset   Cancer Father        liver   Alcohol abuse Father    Liver cancer Father    Cancer Mother        breast   Breast cancer Mother    Hypertension Brother        Half-brother   Coronary artery disease Paternal Aunt        CABG   Cancer Other        GM; Aunt - breast   Colon cancer Neg Hx    Rectal cancer Neg Hx    Stomach cancer Neg Hx    Esophageal cancer Neg Hx     Social History   Socioeconomic History   Marital status: Married    Spouse name: Not on file   Number of children: 2   Years of education: Not on file   Highest education level: Not on file  Occupational History   Occupation: Truck driver    Comment: [employer]  Tobacco Use   Smoking status: Former    Types: Cigarettes, Cigars    Quit date: 04/28/2002    Years since quitting: 19.9   Smokeless tobacco: Never   Tobacco comments:    rare cigar  Vaping Use   Vaping Use: Never used  Substance and Sexual Activity   Alcohol use: Not Currently  Drug use: No   Sexual activity: Not on file  Other Topics Concern   Not  on file  Social History Narrative   Caffeine: occasional coffee   Lives with wife, 1 cat   Some care through the [place] - served 8 years in the [employer]   Occupation: truck driver Adult nurse)   Activity: goes to [employer] 3x/wk   Diet: rare water, lots of sweet tea, some fruits/vegetables   Social Determinants of Health   Financial Resource Strain: Low Risk  (07/05/2021)   Overall Financial Resource Strain (CARDIA)    Difficulty of Paying Living Expenses: Not hard at all  Food Insecurity: No Food Insecurity (01/02/2022)   Hunger Vital Sign    Worried About Running Out of Food in the Last Year: Never true    [place] in the Last Year: Never true  Transportation Needs: No Transportation Needs (01/02/2022)   PRAPARE - Hydrologist (Medical): No    Lack of Transportation (Non-Medical): No  Physical Activity: Inactive (07/05/2021)   Exercise Vital Sign    Days of Exercise per Week: 0 days    Minutes of Exercise per Session: 0 min  Stress: No Stress Concern Present (07/05/2021)   [place]    Feeling of Stress : Not at all  Social Connections: Moderately Isolated (07/05/2021)   Social Connection and Isolation Panel [NHANES]    Frequency of Communication with Friends and Family: Twice a week    Frequency of Social Gatherings with Friends and Family: Once a week    Attends Religious Services: Never    Marine scientist or Organizations: No    Attends Archivist Meetings: Never    Marital Status: Married  Human resources officer Violence: Not At Risk (01/02/2022)   Humiliation, Afraid, Rape, and Kick questionnaire    Fear of Current or Ex-Partner: No    Emotionally Abused: No    Physically Abused: No    Sexually Abused: No    Review of Systems: Positive for *** All other review of systems negative except as mentioned in the HPI.  Physical Exam: Vital signs There were no vitals  taken for this visit.  General:   Alert,  Well-developed, well-nourished, pleasant and cooperative in NAD Lungs:  Clear throughout to auscultation.   Heart:  Regular rate and rhythm; no murmurs, clicks, rubs,  or gallops. Abdomen:  Soft, nontender and nondistended. Normal bowel sounds.   Neuro/Psych:  Alert and cooperative. Normal mood and affect. A and O x 3   '@Reah Justo'$  Simonne Maffucci, MD, [facility] [phone] (pager) 04/08/2022 9:11 PM@

## 2022-04-09 ENCOUNTER — Other Ambulatory Visit: Payer: No Typology Code available for payment source

## 2022-04-09 ENCOUNTER — Telehealth: Payer: Self-pay | Admitting: *Deleted

## 2022-04-09 ENCOUNTER — Encounter: Payer: No Typology Code available for payment source | Admitting: Internal Medicine

## 2022-04-09 NOTE — Telephone Encounter (Signed)
Called pt to check if he was coming for his appointment. States he just got back from an appointment at the [place] because if he had missed that appointment he wouldn't have been able to be seen for another four months. Pt will call back to reschedule.

## 2022-04-10 ENCOUNTER — Other Ambulatory Visit: Payer: No Typology Code available for payment source

## 2022-04-14 ENCOUNTER — Other Ambulatory Visit: Payer: No Typology Code available for payment source

## 2022-04-24 ENCOUNTER — Ambulatory Visit (INDEPENDENT_AMBULATORY_CARE_PROVIDER_SITE_OTHER): Payer: Medicare Other | Admitting: Nurse Practitioner

## 2022-04-24 ENCOUNTER — Ambulatory Visit
Admission: RE | Admit: 2022-04-24 | Discharge: 2022-04-24 | Disposition: A | Discharge status: Home / Self Care | Payer: Medicare Other | Source: Ambulatory Visit | Attending: Nurse Practitioner | Admitting: Nurse Practitioner

## 2022-04-24 ENCOUNTER — Encounter: Payer: Self-pay | Admitting: Nurse Practitioner

## 2022-04-24 VITALS — BP 132/70 | HR 97 | Temp 98.5°F | Resp 16 | Ht 66.0 in | Wt 179.0 lb

## 2022-04-24 DIAGNOSIS — R140 Abdominal distension (gaseous): Secondary | ICD-10-CM | POA: Insufficient documentation

## 2022-04-24 DIAGNOSIS — R197 Diarrhea, unspecified: Secondary | ICD-10-CM | POA: Diagnosis not present

## 2022-04-24 DIAGNOSIS — K8020 Calculus of gallbladder without cholecystitis without obstruction: Secondary | ICD-10-CM | POA: Diagnosis not present

## 2022-04-24 NOTE — Assessment & Plan Note (Signed)
Do think patient is having constipation with leak around watery stools.  Patient is on hydrocodone 2 to 3 tablets every day.  Patient's abdomen is distended bowel sounds are normal.  Pending abdominal x-ray 2 view.  Patient will get these in the outpatient setting

## 2022-04-24 NOTE — Patient Instructions (Signed)
Nice to see you today I will be in touch with the labs once I have them Go and get the xray done and I will be in touch with the results once I have them Follow up with Dr. Danise Mina if you do not improve

## 2022-04-24 NOTE — Assessment & Plan Note (Signed)
Patient having continued diarrhea.  Check electrolytes and renal function.  If lab work unrevealing x-ray unrevealing will do stool cultures

## 2022-04-24 NOTE — Progress Notes (Signed)
   Acute Office Visit  Subjective:     Patient ID: Justin Ali, male    DOB: 07-Mar-1941, 81 y.o.   MRN: [id number]  Chief Complaint  Patient presents with   Diarrhea    But today it was a ball.   Anorexia    X 2 or 3 weeks.     Patient is in today for Decreased appetite  Diarrhea: States that that when he went to bathroom and it was ball shaped that came out, then he was having water times 2 today.  No belly pain . States the watery stools has been going on for months States that he has been having a decreased appetite. States that has been going on since July.   States that he does takes the hydrocodone medication twice a day sometimes 3 times a day. He is seen by Dr. 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  No recent antibiotic use per patient report.  Patient did have colonoscopy in 2015 with Dr. Silvano Rusk.  No polyps removed small nonbleeding cecal AVM otherwise normal exam.   Review of Systems  Constitutional:  Negative for chills and fever.  Gastrointestinal:  Positive for constipation and diarrhea. Negative for abdominal pain, nausea and vomiting.       Abdominal distention "+"        Objective:    BP 132/70   Pulse 97   Temp 98.5 F (36.9 C)   Resp 16   Ht '5\' 6"'$  (1.676 m)   Wt 179 lb (81.2 kg)   SpO2 97%   BMI 28.89 kg/m    Physical Exam Vitals and nursing note reviewed.  Constitutional:      Appearance: Normal appearance.  Cardiovascular:     Rate and Rhythm: Normal rate and regular rhythm.     Heart sounds: Normal heart sounds.  Pulmonary:     Effort: Pulmonary effort is normal.     Breath sounds: Normal breath sounds.  Abdominal:     General: Bowel sounds are normal. There is distension.     Palpations: There is no mass.     Tenderness: There is no abdominal tenderness.     Hernia: No hernia is present.  Neurological:     Mental Status: He is alert.     No results found for any visits on 04/24/22.       Assessment & Plan:   Problem List Items Addressed This Visit       Digestive   Diarrhea of presumed infectious origin - Primary    Patient having continued diarrhea.  Check electrolytes and renal function.  If lab work unrevealing x-ray unrevealing will do stool cultures      Relevant Orders   Comprehensive metabolic panel   CBC   Gastrointestinal Pathogen Pnl RT, PCR     Other   Abdominal distention    Do think patient is having constipation with leak around watery stools.  Patient is on hydrocodone 2 to 3 tablets every day.  Patient's abdomen is distended bowel sounds are normal.  Pending abdominal x-ray 2 view.  Patient will get these in the outpatient setting      Relevant Orders   DG Abd 2 Views    No orders of the defined types were placed in this encounter.   Return if symptoms worsen or fail to improve.  Romilda Garret, NP

## 2022-04-25 ENCOUNTER — Other Ambulatory Visit (INDEPENDENT_AMBULATORY_CARE_PROVIDER_SITE_OTHER): Payer: Medicare Other

## 2022-04-25 DIAGNOSIS — R197 Diarrhea, unspecified: Secondary | ICD-10-CM

## 2022-04-25 LAB — COMPREHENSIVE METABOLIC PANEL
ALT: 6 U/L (ref 0–53)
AST: 14 U/L (ref 0–37)
Albumin: 3.8 g/dL (ref 3.5–5.2)
Alkaline Phosphatase: 57 U/L (ref 39–117)
BUN: 14 mg/dL (ref 6–23)
CO2: 27 mEq/L (ref 19–32)
Calcium: 8.8 mg/dL (ref 8.4–10.5)
Chloride: 108 mEq/L (ref 96–112)
Creatinine, Ser: 1.13 mg/dL (ref 0.40–1.50)
GFR: 61.42 mL/min (ref >60.00)
Glucose, Bld: 103 mg/dL — ABNORMAL HIGH (ref 70–99)
Potassium: 4 mEq/L (ref 3.5–5.1)
Sodium: 144 mEq/L (ref 135–145)
Total Bilirubin: 0.5 mg/dL (ref 0.2–1.2)
Total Protein: 6.3 g/dL (ref 6.0–8.3)

## 2022-04-25 LAB — CBC
HCT: 35.5 % — ABNORMAL LOW (ref 39.0–52.0)
Hemoglobin: 12.2 g/dL — ABNORMAL LOW (ref 13.0–17.0)
MCHC: 34.4 g/dL (ref 30.0–36.0)
MCV: 90.2 fl (ref 78.0–100.0)
Platelets: 183 10*3/uL (ref 150.0–400.0)
RBC: 3.94 Mil/uL — ABNORMAL LOW (ref 4.22–5.81)
RDW: 13.9 % (ref 11.5–15.5)
WBC: 3.3 10*3/uL — ABNORMAL LOW (ref 4.0–10.5)

## 2022-04-29 ENCOUNTER — Telehealth: Payer: Self-pay

## 2022-04-29 LAB — FECAL OCCULT BLOOD, IMMUNOCHEMICAL: Fecal Occult Bld: POSITIVE — AB

## 2022-04-29 NOTE — Telephone Encounter (Signed)
Attempted to contact patient. H#, male answered and advised we call his cell phone. Called cell phone, no answer and VM is full.   Will try to reach patient again later today.

## 2022-04-29 NOTE — Telephone Encounter (Signed)
We can inform patient of positive stool test. If he is established with GI then he needs to follow up if he is not would he prefer [facility] or [facility]

## 2022-04-29 NOTE — Telephone Encounter (Signed)
Mickel Baas with [facility] called + IFOB report (report  is in [place]) and pt saw Romilda Garret NP on 04/24/22 and sending note to Romilda Garret NP and [facility] pool and will teams Pricilla Handler who is working with [facility] today. Results noted in lab critical lab notebook also.

## 2022-04-30 NOTE — Telephone Encounter (Signed)
He sees Dr [name] in [place]. Would recommend he f/u with them as he's also due for rpt EGD. He may call [facility] to schedule an appointment at [phone].

## 2022-05-01 NOTE — Telephone Encounter (Signed)
Spoke with patient and gave recommendations. He states he will contact [facility] to schedule an OV. He will call us back if he cannot find their number.  Nothing further needed at this time.

## 2022-05-05 ENCOUNTER — Telehealth: Payer: Self-pay | Admitting: Internal Medicine

## 2022-05-05 ENCOUNTER — Encounter: Payer: Self-pay | Admitting: Oncology

## 2022-05-15 ENCOUNTER — Other Ambulatory Visit: Payer: Self-pay | Admitting: Oncology

## 2022-05-15 DIAGNOSIS — C7951 Secondary malignant neoplasm of bone: Secondary | ICD-10-CM

## 2022-06-11 ENCOUNTER — Ambulatory Visit: Payer: No Typology Code available for payment source | Admitting: Internal Medicine

## 2022-06-17 ENCOUNTER — Encounter: Payer: Self-pay | Admitting: Oncology

## 2022-06-17 ENCOUNTER — Emergency Department (HOSPITAL_COMMUNITY)
Admission: EM | Admit: 2022-06-17 | Discharge: 2022-06-17 | Disposition: A | Discharge status: Home / Self Care | Payer: No Typology Code available for payment source | Attending: Emergency Medicine | Admitting: Emergency Medicine

## 2022-06-17 ENCOUNTER — Encounter (HOSPITAL_COMMUNITY): Payer: Self-pay

## 2022-06-17 ENCOUNTER — Emergency Department (HOSPITAL_COMMUNITY): Payer: No Typology Code available for payment source

## 2022-06-17 ENCOUNTER — Other Ambulatory Visit: Payer: Self-pay

## 2022-06-17 DIAGNOSIS — H53122 Transient visual loss, left eye: Secondary | ICD-10-CM | POA: Insufficient documentation

## 2022-06-17 DIAGNOSIS — R29810 Facial weakness: Secondary | ICD-10-CM | POA: Diagnosis present

## 2022-06-17 DIAGNOSIS — Z8546 Personal history of malignant neoplasm of prostate: Secondary | ICD-10-CM | POA: Insufficient documentation

## 2022-06-17 DIAGNOSIS — H539 Unspecified visual disturbance: Secondary | ICD-10-CM

## 2022-06-17 DIAGNOSIS — I10 Essential (primary) hypertension: Secondary | ICD-10-CM | POA: Insufficient documentation

## 2022-06-17 DIAGNOSIS — I2510 Atherosclerotic heart disease of native coronary artery without angina pectoris: Secondary | ICD-10-CM | POA: Insufficient documentation

## 2022-06-17 DIAGNOSIS — Z79899 Other long term (current) drug therapy: Secondary | ICD-10-CM | POA: Insufficient documentation

## 2022-06-17 DIAGNOSIS — H532 Diplopia: Secondary | ICD-10-CM

## 2022-06-17 LAB — COMPREHENSIVE METABOLIC PANEL
ALT: 11 U/L (ref 0–44)
AST: 17 U/L (ref 15–41)
Albumin: 3.4 g/dL — ABNORMAL LOW (ref 3.5–5.0)
Alkaline Phosphatase: 62 U/L (ref 38–126)
Anion gap: 6 (ref 5–15)
BUN: 11 mg/dL (ref 8–23)
CO2: 28 mmol/L (ref 22–32)
Calcium: 9 mg/dL (ref 8.9–10.3)
Chloride: 108 mmol/L (ref 98–111)
Creatinine, Ser: 1.19 mg/dL (ref 0.61–1.24)
GFR, Estimated: >60 mL/min (ref >60)
Glucose, Bld: 111 mg/dL — ABNORMAL HIGH (ref 70–99)
Potassium: 3.3 mmol/L — ABNORMAL LOW (ref 3.5–5.1)
Sodium: 142 mmol/L (ref 135–145)
Total Bilirubin: 0.6 mg/dL (ref 0.3–1.2)
Total Protein: 5.8 g/dL — ABNORMAL LOW (ref 6.5–8.1)

## 2022-06-17 LAB — RAPID URINE DRUG SCREEN, HOSP PERFORMED
Amphetamines: NOT DETECTED
Barbiturates: NOT DETECTED
Benzodiazepines: POSITIVE — AB
Cocaine: NOT DETECTED
Opiates: NOT DETECTED
Tetrahydrocannabinol: NOT DETECTED

## 2022-06-17 LAB — DIFFERENTIAL
Abs Immature Granulocytes: 0.01 10*3/uL (ref 0.00–0.07)
Basophils Absolute: 0 10*3/uL (ref 0.0–0.1)
Basophils Relative: 1 %
Eosinophils Absolute: 0.1 10*3/uL (ref 0.0–0.5)
Eosinophils Relative: 2 %
Immature Granulocytes: 0 %
Lymphocytes Relative: 30 %
Lymphs Abs: 1.2 10*3/uL (ref 0.7–4.0)
Monocytes Absolute: 0.3 10*3/uL (ref 0.1–1.0)
Monocytes Relative: 8 %
Neutro Abs: 2.5 10*3/uL (ref 1.7–7.7)
Neutrophils Relative %: 59 %

## 2022-06-17 LAB — CBC
HCT: 34.6 % — ABNORMAL LOW (ref 39.0–52.0)
Hemoglobin: 11.8 g/dL — ABNORMAL LOW (ref 13.0–17.0)
MCH: 31.6 pg (ref 26.0–34.0)
MCHC: 34.1 g/dL (ref 30.0–36.0)
MCV: 92.5 fL (ref 80.0–100.0)
Platelets: 179 10*3/uL (ref 150–400)
RBC: 3.74 MIL/uL — ABNORMAL LOW (ref 4.22–5.81)
RDW: 13.4 % (ref 11.5–15.5)
WBC: 4.2 10*3/uL (ref 4.0–10.5)
nRBC: 0 % (ref 0.0–0.2)

## 2022-06-17 LAB — I-STAT CHEM 8, ED
BUN: 12 mg/dL (ref 8–23)
Calcium, Ion: 1.22 mmol/L (ref 1.15–1.40)
Chloride: 107 mmol/L (ref 98–111)
Creatinine, Ser: 1.3 mg/dL — ABNORMAL HIGH (ref 0.61–1.24)
Glucose, Bld: 109 mg/dL — ABNORMAL HIGH (ref 70–99)
HCT: 32 % — ABNORMAL LOW (ref 39.0–52.0)
Hemoglobin: 10.9 g/dL — ABNORMAL LOW (ref 13.0–17.0)
Potassium: 3.4 mmol/L — ABNORMAL LOW (ref 3.5–5.1)
Sodium: 145 mmol/L (ref 135–145)
TCO2: 26 mmol/L (ref 22–32)

## 2022-06-17 LAB — APTT: aPTT: 30 seconds (ref 24–36)

## 2022-06-17 LAB — ETHANOL: Alcohol, Ethyl (B): <10 mg/dL (ref <10)

## 2022-06-17 LAB — PROTIME-INR
INR: 1 (ref 0.8–1.2)
Prothrombin Time: 13.2 seconds (ref 11.4–15.2)

## 2022-06-17 NOTE — ED Provider Triage Note (Signed)
Emergency Medicine Provider Triage Evaluation Note  TYION GHOBRIAL , a 82 y.o. male  was evaluated in triage.  Pt complains of diplopia and left eye issues.  He has had several days of difficulty with vision.  He has noticed that his left eye is tracking out to the side.  He is never had anything like this before.  He called his eye doctor who told him to come to the emergency department for further evaluation.  He denies any other neurologic deficits..  Review of Systems  Positive: Left eye intermittent external rotation Negative: Unilateral weakness  Physical Exam  BP (!) 141/84 (BP Location: Left Leg)   Pulse 88   Temp 98.6 F (37 C)   Resp 18   Ht 5' 6"$  (1.676 m)   Wt 83.9 kg   SpO2 95%   BMI 29.86 kg/m  Gen:   Awake, no distress   Resp:  Normal effort  MSK:   Moves extremities without difficulty  Other:    Medical Decision Making  Medically screening exam initiated at 6:14 PM.  Appropriate orders placed.  [name] was informed that the remainder of the evaluation will be completed by another provider, this initial triage assessment does not replace that evaluation, and the importance of remaining in the ED until their evaluation is complete.  Suspect stroke, workup initiated, not in the window for intervention   Margarita Mail, PA-C 06/17/22 1815

## 2022-06-17 NOTE — ED Triage Notes (Addendum)
Pt c/o of right eye droop since approx 3pm. Pt unsure of lkw. Pt states he had injury to eye and procedure done on it over 30 years ago.Denies blurred or double vision. Denies any recent injury.

## 2022-06-17 NOTE — Discharge Instructions (Signed)
Your history, exam, and workup today did not show evidence of acute stroke or other acute finding.  The MRI did not show stroke and did show the chronic tissue changes to your left lateral orbit.  I suspect the area you have had surgery and scar tissue may have caused some of the intermittent double vision today however on my exam your vision was without problem and you had no double vision.  The rest of exam was reassuring as was the workup.  We feel you are safe for discharge home but please follow-up with your ophthalmologist.  If any symptoms change or worsen acutely, please return to the nearest emergency department.

## 2022-06-17 NOTE — ED Provider Notes (Signed)
[facility] Provider Note   CSN: [id number] Arrival date & time: 06/17/22  1718     History  Chief Complaint  Patient presents with   Facial Droop    DEDRIC LALAMA is a 82 y.o. male.  The history is provided by the patient and medical records. No language interpreter was used.  Neurologic Problem This is a new problem. The current episode started 12 to 24 hours ago. The problem occurs constantly. The problem has been resolved. Pertinent negatives include no chest pain, no abdominal pain, no headaches and no shortness of breath. Nothing aggravates the symptoms. Nothing relieves the symptoms. He has tried nothing for the symptoms. The treatment provided no relief.       Home Medications Prior to Admission medications   Medication Sig Start Date End Date Taking? Authorizing Provider  abiraterone acetate (ZYTIGA) 250 MG tablet Take 4 tablets (1,000 mg total) by mouth daily. Take on an empty stomach 1 hour before or 2 hours after a meal 12/23/21   Darl Pikes, RPH-CPP  amLODipine (NORVASC) 5 MG tablet Take 1 tablet (5 mg total) by mouth daily. 06/18/17   Ria Bush, MD  CALCIUM PO Take 1,200 mg by mouth daily.    [provider]  cholecalciferol (VITAMIN D) 1000 UNITS tablet Take 2,000 Units by mouth daily.    [provider]  clotrimazole (LOTRIMIN) 1 % cream Apply 1 application topically 2 (two) times daily. For 2 weeks or until full resolution 07/13/20   Ria Bush, MD  gabapentin (NEURONTIN) 300 MG capsule Take 1 capsule (300 mg total) by mouth at bedtime. 06/18/17   Ria Bush, MD  HYDROcodone-acetaminophen (NORCO/VICODIN) 5-325 MG tablet Take 1 tablet by mouth 3 (three) times daily as needed for moderate pain. 05/15/22   Lloyd Huger, MD  lisinopril (PRINIVIL,ZESTRIL) 5 MG tablet Take 1 tablet (5 mg total) by mouth daily. 06/18/17   Ria Bush, MD  Multiple Vitamins-Minerals  (CENTRUM SILVER 50+MEN) TABS Take by mouth daily.    [provider]  Omega-3 Fatty Acids (FISH OIL) 1000 MG CAPS Take 2 capsules (2,000 mg total) by mouth daily. 07/13/20   Ria Bush, MD  ondansetron (ZOFRAN) 4 MG tablet Take 1 tablet (4 mg total) by mouth daily as needed for nausea or vomiting. 01/03/22 01/03/23  Sidney Ace, MD  pantoprazole (PROTONIX) 40 MG tablet Take 1 tablet (40 mg total) by mouth every morning. 01/29/22   Noralyn Pick, NP  predniSONE (DELTASONE) 5 MG tablet Take 1 tablet (5 mg total) by mouth daily with breakfast. 12/23/21   Darl Pikes, RPH-CPP  temazepam (RESTORIL) 30 MG capsule Take 1 capsule (30 mg total) by mouth at bedtime. 02/25/22   Ria Bush, MD  traMADol (ULTRAM) 50 MG tablet Take 1 tablet (50 mg total) by mouth 2 (two) times daily as needed. 09/05/21   Lloyd Huger, MD  traZODone (DESYREL) 50 MG tablet Take 1 tablet (50 mg total) by mouth 2 (two) times daily. 03/15/22   Ria Bush, MD      Allergies    Patient has no known allergies.    Review of Systems   Review of Systems  Constitutional:  Negative for chills, fatigue and fever.  HENT:  Negative for congestion.   Eyes:  Positive for visual disturbance (rsolved now). Negative for photophobia, pain and discharge.  Respiratory:  Negative for cough, chest tightness and shortness of breath.   Cardiovascular:  Negative for chest pain and palpitations.  Gastrointestinal:  Negative for abdominal pain, constipation, diarrhea, nausea and vomiting.  Genitourinary:  Negative for flank pain.  Musculoskeletal:  Negative for back pain, neck pain and neck stiffness.  Skin:  Negative for rash and wound.  Neurological:  Negative for weakness, light-headedness and headaches.  Psychiatric/Behavioral:  Negative for agitation and confusion.   All other systems reviewed and are negative.   Physical Exam Updated Vital Signs BP (!) 150/85   Pulse 67   Temp 98.6 F  (37 C)   Resp 11   Ht 5' 6"$  (1.676 m)   Wt 83.9 kg   SpO2 100%   BMI 29.86 kg/m  Physical Exam Constitutional:      General: He is not in acute distress.    Appearance: He is well-developed. He is not ill-appearing, toxic-appearing or diaphoretic.  HENT:     Head: Normocephalic and atraumatic.     Right Ear: External ear normal.     Left Ear: External ear normal.     Nose: Nose normal. No congestion or rhinorrhea.     Mouth/Throat:     Mouth: Mucous membranes are moist.     Pharynx: No oropharyngeal exudate or posterior oropharyngeal erythema.  Eyes:     General: No scleral icterus.       Right eye: No discharge.        Left eye: No discharge.     Extraocular Movements: Extraocular movements intact.     Conjunctiva/sclera: Conjunctivae normal.     Pupils: Pupils are equal, round, and reactive to light.  Neck:     Vascular: No carotid bruit.  Cardiovascular:     Rate and Rhythm: Normal rate.  Pulmonary:     Effort: No respiratory distress.     Breath sounds: No stridor. No wheezing, rhonchi or rales.  Chest:     Chest wall: No tenderness.  Abdominal:     Palpations: Abdomen is soft.     Tenderness: There is no abdominal tenderness. There is no guarding or rebound.  Musculoskeletal:        General: No tenderness.     Cervical back: Normal range of motion and neck supple. No tenderness.  Skin:    General: Skin is warm.     Capillary Refill: Capillary refill takes less than 2 seconds.     Coloration: Skin is not pale.     Findings: No erythema or rash.  Neurological:     General: No focal deficit present.     Mental Status: He is alert and oriented to person, place, and time.     Cranial Nerves: No cranial nerve deficit.     Sensory: No sensory deficit.     Motor: No weakness or abnormal muscle tone.     Coordination: Coordination normal.  Psychiatric:        Mood and Affect: Mood normal.     ED Results / Procedures / Treatments   Labs (all labs ordered are  listed, but only abnormal results are displayed) Labs Reviewed  CBC - Abnormal; Notable for the following components:      Result Value   RBC 3.74 (*)    Hemoglobin 11.8 (*)    HCT 34.6 (*)    All other components within normal limits  COMPREHENSIVE METABOLIC PANEL - Abnormal; Notable for the following components:   Potassium 3.3 (*)    Glucose, Bld 111 (*)    Total Protein 5.8 (*)    Albumin  3.4 (*)    All other components within normal limits  RAPID URINE DRUG SCREEN, HOSP PERFORMED - Abnormal; Notable for the following components:   Benzodiazepines POSITIVE (*)    All other components within normal limits  I-STAT CHEM 8, ED - Abnormal; Notable for the following components:   Potassium 3.4 (*)    Creatinine, Ser 1.30 (*)    Glucose, Bld 109 (*)    Hemoglobin 10.9 (*)    HCT 32.0 (*)    All other components within normal limits  ETHANOL  PROTIME-INR  APTT  DIFFERENTIAL    EKG EKG Interpretation  Date/Time:  Tuesday June 17 2022 18:29:03 EST Ventricular Rate:  55 PR Interval:  156 QRS Duration: 72 QT Interval:  420 QTC Calculation: 401 R Axis:   -14 Text Interpretation: Sinus bradycardia Otherwise normal ECG When compared with ECG of 30-May-2021 12:37, PREVIOUS ECG IS PRESENT when comapred to prior, overal similar with slightly slower rate. No STEMI Confirmed by Antony Blackbird [phone]) on 06/17/2022 6:49:25 PM  Radiology MR BRAIN WO CONTRAST  Result Date: 06/17/2022 CLINICAL DATA:  Diplopia.  Abnormal vision left eye. EXAM: MRI HEAD WITHOUT CONTRAST TECHNIQUE: Multiplanar, multiecho pulse sequences of the brain and surrounding structures were obtained without intravenous contrast. COMPARISON:  CT 03/02/2022.  MRI 05/30/2021. FINDINGS: Brain: Diffusion imaging does not show any acute or subacute infarction or other cause of restricted diffusion. Brainstem and cerebellum are normal. Cerebral hemispheres show mild chronic small-vessel ischemic change of the white matter,  often seen at this age. No cortical or large vessel territory infarction. No evidence of intracranial mass lesion, hemorrhage, hydrocephalus or extra-axial collection. Findings are similar to the study of February 2023. Vascular: Major vessels at the base of the brain show flow. Skull and upper cervical spine: Negative Sinuses/Orbits: Sinuses are clear. Right orbital region is normal. No acute intraorbital finding on the left. 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 There is some potential this could possibly affect movement of the globe, though there is no sign of gross enlargement or change since the study of last year. Other: Right mastoid effusion, slightly more extensive than was seen on last year's exam. IMPRESSION: 1. No acute brain finding. 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 2. Right mastoid effusion, slightly more extensive than was seen on last year's study. 3. 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 There is some potential this could possibly affect movement of the globe, though there is no gross enlargement or change since the study of last year. Electronically Signed   By: Nelson Chimes M.D.   On: 06/17/2022 19:30    Procedures Procedures    Medications Ordered in ED Medications - No data to display  ED Course/ Medical Decision Making/ A&P                             Medical Decision Making   COREN RYER is a 82 y.o. male with a past medical history significant for hypertension, hyperlipidemia, previous prostate cancer, sleep apnea, CAD, gastric ulcer, and previous left eye surgery years ago who presents with double vision transiently.  According to patient, he  had surgery on  his left eye and face about 30 years ago and has not had any trouble since.  He reports that today, he started having some intermittent double vision that would come and go.  It has resolved since he has been here and has not had double vision.  He denies any temple pain or headache.  Denies any nausea or vomiting.  Denies any vision changes currently.  Denies any blurry vision with it.  Denies eye pain.  Denies any speech difficulties or any extremity symptoms such as numbness, tingling, weakness.  Denies any difficulty with ambulation or other complaints.  Reports he was at his baseline and denies any fevers, chills, neck pain, neck stiffness, congestion, cough, or other complaints.  On exam, lungs clear and chest nontender.  Abdomen nontender.  Normal sensation, strength, and pulses in extremities.  Normal finger-nose-finger testing bilaterally.  Pupils are symmetric and reactive with normal extract movements.  Some postsurgical scars and scar seen on his left lateral orbit and facial area.  Otherwise eye exam is unremarkable.  No tenderness.  No carotid bruit appreciated.  Normal neck range of motion.  Patient well-appearing.  Patient was seen in triage and had an MRI to rule out stroke.  No stroke was seen.  He has unchanged soft tissue changes and mass in the left lateral orbit likely from previous scarring or surgery.  Given his reassuring workup and now no evidence of stroke, do feel patient safe for discharge home.  Patient reports he will call his ophthalmologist to try to get seen.  I suspect that his double vision may have been from that area pushing on his eye however it has stopped and he has no symptoms now.  Have low suspicion for TIA or an intrinsic eyeball problem given his lack of any eye pain.  With him describing no pain, blurry vision, or foreign bodies, will agree to hold on fluorescein stain and checking pressures.  Low suspicion for glaucoma or other eye injury.  Given  his well appearance he would like to go home.  He will call his ophthalmologist to get seen in the next few days and he understands return precautions.  Patient other questions or concerns and patient discharged in good condition.              Final Clinical Impression(s) / ED Diagnoses Final diagnoses:  Transient vision disturbance of left eye  Transient diplopia    Rx / DC Orders ED Discharge Orders     None      Clinical Impression: 1. Transient vision disturbance of left eye   2. Transient diplopia     Disposition: Discharge  Condition: Good  I have discussed the results, Dx and Tx plan with the pt(& family if present). He/she/they expressed understanding and agree(s) with the plan. Discharge instructions discussed at great length. Strict return precautions discussed and pt &/or family have verbalized understanding of the instructions. No further questions at time of discharge.    Discharge Medication List as of 06/17/2022  9:10 PM      Follow Up: your ophthalmologist     [facility] [address] [id number] [place]       Tarika Mckethan, Gwenyth Allegra, MD 06/17/22 2120

## 2022-06-25 ENCOUNTER — Telehealth: Payer: Self-pay

## 2022-06-25 NOTE — Telephone Encounter (Signed)
     Patient  visit on [facility]  at 2/20   Have you been able to follow up with your primary care physician? Yes   The patient was or was not able to obtain any needed medicine or equipment. Yes   Are there diet recommendations that you are having difficulty following? Na   Patient expresses understanding of discharge instructions and education provided has no other needs at this time.  Yes      [facility] [phone] [address], [place], [place] [address] Phone: [phone] Email: [email or web address]

## 2022-06-27 ENCOUNTER — Encounter: Payer: Self-pay | Admitting: Oncology

## 2022-06-27 ENCOUNTER — Inpatient Hospital Stay: Payer: Medicare Other | Attending: Oncology

## 2022-06-27 ENCOUNTER — Inpatient Hospital Stay: Payer: Medicare Other

## 2022-06-27 ENCOUNTER — Inpatient Hospital Stay (HOSPITAL_BASED_OUTPATIENT_CLINIC_OR_DEPARTMENT_OTHER): Payer: Medicare Other | Admitting: Oncology

## 2022-06-27 VITALS — BP 130/75 | HR 75 | Temp 97.7°F | Resp 16 | Ht 66.0 in | Wt 179.8 lb

## 2022-06-27 DIAGNOSIS — C61 Malignant neoplasm of prostate: Secondary | ICD-10-CM

## 2022-06-27 DIAGNOSIS — C7951 Secondary malignant neoplasm of bone: Secondary | ICD-10-CM | POA: Insufficient documentation

## 2022-06-27 LAB — COMPREHENSIVE METABOLIC PANEL
ALT: 11 U/L (ref 0–44)
AST: 18 U/L (ref 15–41)
Albumin: 3.9 g/dL (ref 3.5–5.0)
Alkaline Phosphatase: 59 U/L (ref 38–126)
Anion gap: 9 (ref 5–15)
BUN: 11 mg/dL (ref 8–23)
CO2: 24 mmol/L (ref 22–32)
Calcium: 9 mg/dL (ref 8.9–10.3)
Chloride: 107 mmol/L (ref 98–111)
Creatinine, Ser: 1.01 mg/dL (ref 0.61–1.24)
GFR, Estimated: >60 mL/min (ref >60)
Glucose, Bld: 96 mg/dL (ref 70–99)
Potassium: 3.7 mmol/L (ref 3.5–5.1)
Sodium: 140 mmol/L (ref 135–145)
Total Bilirubin: 0.6 mg/dL (ref 0.3–1.2)
Total Protein: 6.6 g/dL (ref 6.5–8.1)

## 2022-06-27 LAB — CBC WITH DIFFERENTIAL/PLATELET
Abs Immature Granulocytes: 0.03 10*3/uL (ref 0.00–0.07)
Basophils Absolute: 0 10*3/uL (ref 0.0–0.1)
Basophils Relative: 0 %
Eosinophils Absolute: 0.1 10*3/uL (ref 0.0–0.5)
Eosinophils Relative: 2 %
HCT: 37.1 % — ABNORMAL LOW (ref 39.0–52.0)
Hemoglobin: 12.4 g/dL — ABNORMAL LOW (ref 13.0–17.0)
Immature Granulocytes: 1 %
Lymphocytes Relative: 18 %
Lymphs Abs: 1 10*3/uL (ref 0.7–4.0)
MCH: 30.5 pg (ref 26.0–34.0)
MCHC: 33.4 g/dL (ref 30.0–36.0)
MCV: 91.4 fL (ref 80.0–100.0)
Monocytes Absolute: 0.3 10*3/uL (ref 0.1–1.0)
Monocytes Relative: 5 %
Neutro Abs: 4 10*3/uL (ref 1.7–7.7)
Neutrophils Relative %: 74 %
Platelets: 210 10*3/uL (ref 150–400)
RBC: 4.06 MIL/uL — ABNORMAL LOW (ref 4.22–5.81)
RDW: 13.6 % (ref 11.5–15.5)
WBC: 5.4 10*3/uL (ref 4.0–10.5)
nRBC: 0 % (ref 0.0–0.2)

## 2022-06-27 LAB — PSA: Prostatic Specific Antigen: 1.1 ng/mL (ref 0.00–4.00)

## 2022-06-27 MED ORDER — DENOSUMAB 120 MG/1.7ML ~~LOC~~ SOLN
120.0000 mg | Freq: Once | SUBCUTANEOUS | Status: AC
  Administered 2022-06-27: 120 mg via SUBCUTANEOUS
  Filled 2022-06-27: qty 1.7

## 2022-06-27 NOTE — Progress Notes (Signed)
Was seen in the ED on 2/20. Wants to discuss brain scan.

## 2022-06-27 NOTE — Progress Notes (Signed)
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  INTERVAL HISTORY: Patient returns to clinic today for routine 22-monthevaluation and continuation of [place].  He was recently seen in the emergency room with facial droop, but MRI did not reveal any acute pathology.  Several chronic issues were unchanged. He continues to tolerate his treatment with Zytiga, prednisone, and Xgeva without significant side effects. He has no neurologic complaints.  He has a good appetite and denies weight loss.  He denies any recent fevers or illnesses.  He denies any chest pain, shortness of breath, cough, or hemoptysis.  He denies any nausea, vomiting, constipation, or diarrhea. He has no urinary complaints.  Patient offers no further specific complaints today.  REVIEW OF SYSTEMS:   Review of Systems  Constitutional: Negative.  Negative for fever, malaise/fatigue and weight loss.  Eyes: Negative.  Negative for blurred vision.  Respiratory: Negative.  Negative for cough and shortness of breath.   Cardiovascular: Negative.  Negative for chest pain and leg swelling.  Gastrointestinal: Negative.  Negative for abdominal pain and constipation.  Genitourinary: Negative.  Negative for flank pain, frequency and urgency.  Musculoskeletal: Negative.  Negative for back pain and joint pain.  Skin: Negative.  Negative for rash.  Neurological: Negative.  Negative for dizziness, sensory change, focal weakness and weakness.  Psychiatric/Behavioral: Negative.  The patient is not nervous/anxious and does not have  insomnia.     As per HPI. Otherwise, a complete review of systems is negative.  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   2 mm - non-bleeding 03/18/2013 colonoscopy    CAD (coronary artery disease), native coronary artery 2014   By CT lung (04/2013)    Cataract    Gastric ulcer    HLD (hyperlipidemia)    HTN (hypertension)    Hyperglycemia    NAFLD (nonalcoholic fatty liver disease) 08/2011   by abd [place] increased LFTs   OSA (obstructive sleep apnea)    did not tolerate CPAP   Other benign neoplasm of connective and other soft tissue of unspecified site    Neurofibroma of lateral periorbital area   Peripheral neuropathy    Personal history of colonic adenomas 08/05/2012   Pneumonia 01/2013   CAP LUL   Restless legs syndrome (RLS)    Sleep apnea    no cpap    PAST SURGICAL HISTORY: Past Surgical History:  Procedure Laterality Date   COLONOSCOPY  07/2012   5 adenomas (tubular, TV, serrated), rec rpt 5 months ([name]   COLONOSCOPY  02/2013   residual adenomas, cecal AVM, rec rpt 1 yr ([name]   COLONOSCOPY  03/2014   no residual polyp, cecal AVM, rpt 3-4 yrs ([name]   ECTROPION REPAIR Bilateral 09/29/2017   Procedure: REPAIR OF ECTROPION, EXTENSIVE UPPER AND LOWER;  Surgeon: FKarle Starch MD;  [facility]  Service: Ophthalmology;  Laterality: Bilateral;   ESOPHAGOGASTRODUODENOSCOPY (EGD) WITH PROPOFOL N/A 01/02/2022   normal esophagus, erosive gastropathy without recent bleed, nonbleeding gastric ulcers and duodenal ulcer with flat pigmented spotWohl, [name], MD)   ORIF ANKLE FRACTURE  11/29/2000   Dr.  Smith   PTOSIS REPAIR Bilateral 09/29/2017   Procedure: BLEPHAROPTOSIS REPAIR RESECT EX UPPER;  Surgeon: Karle Starch, MD;  Location: [facility];  Service: Ophthalmology;  Laterality: Bilateral;   RECONSTRUCTION OF EYELID Bilateral 05/31/2018   SKIN CANCER EXCISION  1997   left  anterior neck    FAMILY HISTORY: Family History  Problem Relation Age of Onset   Cancer Father        liver   Alcohol abuse Father    Liver cancer Father    Cancer Mother        breast   Breast cancer Mother    Hypertension Brother        Half-brother   Coronary artery disease Paternal Aunt        CABG   Cancer Other        GM; Aunt - breast   Colon cancer Neg Hx    Rectal cancer Neg Hx    Stomach cancer Neg Hx    Esophageal cancer Neg Hx     ADVANCED DIRECTIVES (Y/N):  N  HEALTH MAINTENANCE: Social History   Tobacco Use   Smoking status: Former    Types: Cigarettes, Cigars    Quit date: 04/28/2002    Years since quitting: 20.1   Smokeless tobacco: Never   Tobacco comments:    rare cigar  Vaping Use   Vaping Use: Never used  Substance Use Topics   Alcohol use: Not Currently   Drug use: No     Colonoscopy:  PAP:  Bone density:  Lipid panel:  No Known Allergies  Current Outpatient Medications  Medication Sig Dispense Refill   abiraterone acetate (ZYTIGA) 250 MG tablet Take 4 tablets (1,000 mg total) by mouth daily. Take on an empty stomach 1 hour before or 2 hours after a meal 120 tablet 5   amLODipine (NORVASC) 5 MG tablet Take 1 tablet (5 mg total) by mouth daily. 90 tablet 3   CALCIUM PO Take 1,200 mg by mouth daily.     cholecalciferol (VITAMIN D) 1000 UNITS tablet Take 2,000 Units by mouth daily.     clotrimazole (LOTRIMIN) 1 % cream Apply 1 application topically 2 (two) times daily. For 2 weeks or until full resolution 30 g 0   gabapentin (NEURONTIN) 300 MG capsule Take 1 capsule (300 mg total) by mouth at bedtime. 90 capsule 3   HYDROcodone-acetaminophen (NORCO/VICODIN) 5-325 MG tablet Take 1 tablet by mouth 3 (three) times daily as needed for moderate pain. 90 tablet 0   lisinopril (PRINIVIL,ZESTRIL) 5 MG tablet Take 1 tablet (5 mg total) by mouth daily. 90 tablet 3   Multiple Vitamins-Minerals (CENTRUM SILVER 50+MEN) TABS Take by mouth daily.      Omega-3 Fatty Acids (FISH OIL) 1000 MG CAPS Take 2 capsules (2,000 mg total) by mouth daily.     ondansetron (ZOFRAN) 4 MG tablet Take 1 tablet (4 mg total) by mouth daily as needed for nausea or vomiting. 30 tablet 1   pantoprazole (PROTONIX) 40 MG tablet Take 1 tablet (40 mg total) by mouth every morning. 30 tablet 3   predniSONE (DELTASONE) 5 MG tablet Take 1 tablet (5 mg total) by mouth daily with breakfast. 30 tablet 5   temazepam (RESTORIL) 30 MG capsule Take 1 capsule (30 mg total) by mouth at bedtime. 30 capsule 3   traMADol (ULTRAM) 50 MG tablet Take 1 tablet (50 mg total) by mouth 2 (two) times daily as needed. 30 tablet 0   traZODone (  DESYREL) 50 MG tablet Take 1 tablet (50 mg total) by mouth 2 (two) times daily. 180 tablet 1   No current facility-administered medications for this visit.    OBJECTIVE: Vitals:   06/27/22 1011  BP: 130/75  Pulse: 75  Resp: 16  Temp: 97.7 F (36.5 C)  SpO2: 100%     Body mass index is 29.02 kg/m.    ECOG FS:0 - Asymptomatic  General: Well-developed, well-nourished, no acute distress. Eyes: Pink conjunctiva, anicteric sclera. HEENT: Normocephalic, moist mucous membranes. Lungs: No audible wheezing or coughing. Heart: Regular rate and rhythm. Abdomen: Soft, nontender, no obvious distention. Musculoskeletal: No edema, cyanosis, or clubbing. Neuro: Alert, answering all questions appropriately. Cranial nerves grossly intact. Skin: No rashes or petechiae noted. Psych: Normal affect.\  LAB RESULTS:  Lab Results  Component Value Date   NA 140 06/27/2022   K 3.7 06/27/2022   CL 107 06/27/2022   CO2 24 06/27/2022   GLUCOSE 96 06/27/2022   BUN 11 06/27/2022   CREATININE 1.01 06/27/2022   CALCIUM 9.0 06/27/2022   PROT 6.6 06/27/2022   ALBUMIN 3.9 06/27/2022   AST 18 06/27/2022   ALT 11 06/27/2022   ALKPHOS 59 06/27/2022   BILITOT 0.6 06/27/2022   GFRNONAA >60 06/27/2022   GFRAA >60 12/13/2019    Lab Results  Component Value Date    WBC 5.4 06/27/2022   NEUTROABS 4.0 06/27/2022   HGB 12.4 (L) 06/27/2022   HCT 37.1 (L) 06/27/2022   MCV 91.4 06/27/2022   PLT 210 06/27/2022     STUDIES: MR BRAIN WO CONTRAST  Result Date: 06/17/2022 CLINICAL DATA:  Diplopia.  Abnormal vision left eye. EXAM: MRI HEAD WITHOUT CONTRAST TECHNIQUE: Multiplanar, multiecho pulse sequences of the brain and surrounding structures were obtained without intravenous contrast. COMPARISON:  CT 03/02/2022.  MRI 05/30/2021. FINDINGS: Brain: Diffusion imaging does not show any acute or subacute infarction or other cause of restricted diffusion. Brainstem and cerebellum are normal. Cerebral hemispheres show mild chronic small-vessel ischemic change of the white matter, often seen at this age. No cortical or large vessel territory infarction. No evidence of intracranial mass lesion, hemorrhage, hydrocephalus or extra-axial collection. Findings are similar to the study of February 2023. Vascular: Major vessels at the base of the brain show flow. Skull and upper cervical spine: Negative Sinuses/Orbits: Sinuses are clear. Right orbital region is normal. No acute intraorbital finding on the left. 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 There is some potential this could possibly affect movement of the globe, though there is no sign of gross enlargement or change since the study of last year. Other: Right mastoid effusion, slightly more extensive than was seen on last year's exam. IMPRESSION: 1. No acute brain finding. 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 2. Right mastoid effusion, slightly more extensive than was seen on last year's study. 3. No change in the appearance of the soft tissue mass along the lateral orbital margin on the left, presumed to represent a  neurofibroma by prior diagnosis. There is some potential this could possibly affect movement of the globe, though there is no gross enlargement or change since the study of last year. Electronically Signed   By: Nelson Chimes M.D.   On: 06/17/2022 19:30    ASSESSMENT: Stage IV prostate cancer with bulky abdominal lymphadenopathy and widespread bony  metastasis.  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 Prostate biopsy results revealed Gleason's 8 (4+4) adenocarcinoma.  His most recent bone scan on January 15, 2017 revealed improvement of his bony metastasis.  Initially on Sep 16, 2016 patient's PSA was approximately 194.  Since August 2018 his PSA has ranged from 0.43-0.97.  Today's result has slightly trended up to 1.17.   His most recent imaging with CT scan on Sep 16, 2021 reviewed independently with extensive known metastatic osseous lesions, but no new or progressive disease.  Continue Zytiga and prednisone as prescribed until intolerable side effects or definitive progression of disease.  Proceed with Xgeva today.  Patient wishes to transfer to the [place], [place].  A referral has been placed and no further follow-up has been scheduled.   Pain: Chronic and unchanged.  Continue Vicodin as prescribed.  Renal insufficiency: Resolved. Insomnia: Patient does not complain of this today.  Continue treatment and management per primary care. Hypokalemia: Resolved. 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  Recommended close follow-up at the [place].   Patient expressed understanding and was in agreement with this plan. He also understands that He can call clinic at any time with any questions, concerns, or complaints.    Cancer Staging  Primary malignant 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 [facility]) Staging form: Prostate,  AJCC 8th Edition - Clinical stage from 09/26/2016: Stage IVB (cTX, cN1, cM1c, PSA: 193) - Signed by Lloyd Huger, MD on 09/26/2016 Prostate specific antigen (PSA) range: 20 or greater   Lloyd Huger, MD   06/27/2022 12:27 PM

## 2022-06-30 ENCOUNTER — Other Ambulatory Visit: Payer: Self-pay | Admitting: Family Medicine

## 2022-06-30 NOTE — Telephone Encounter (Signed)
Name of Medication: Temazepam Name of Pharmacy: [facility] or Written Date and Quantity: 05/22/22, #30 Last Office Visit and Type: 03/14/22, ER f/u Next Office Visit and Type: none Last Controlled Substance Agreement Date: none Last UDS: none

## 2022-07-02 ENCOUNTER — Inpatient Hospital Stay (HOSPITAL_BASED_OUTPATIENT_CLINIC_OR_DEPARTMENT_OTHER): Payer: Medicare Other | Admitting: Medical Oncology

## 2022-07-02 VITALS — BP 134/68 | HR 65 | Temp 97.9°F | Resp 18

## 2022-07-02 DIAGNOSIS — R451 Restlessness and agitation: Secondary | ICD-10-CM

## 2022-07-02 DIAGNOSIS — R4689 Other symptoms and signs involving appearance and behavior: Secondary | ICD-10-CM

## 2022-07-02 NOTE — Progress Notes (Addendum)
Patient came to clinic demanding to speak to his oncologist to discuss his dissatisfaction with his medical care. Patient allowed rn to take his vitals, but declines to answer any further questions or concerns regarding his medical care. "I'm the same as I was the other day. No changes." I asked the patient what specific concerns he had today. He stated that "I wants to know why Dr. Grayland Ormond left the exam room and didn't give me the injection. The nurse gave me the injection." He also requested "a new medical provider today." Pt appears agitated and argumentative with nurse when I personally asked him if had anything specific that we wanted to address. He reiterated that he was not happy with his medical care. "Have Dr. Grayland Ormond explain why he left and didn't give me an injection. Why a nurse gave me the injection." I explained to patient that the nurses give the injections to the patient not the provider. Patient stated that we "did not give him any apts" when he last saw the provider, yet he has an AVS that he brought with him to the apt today. He also stated that Dr. Grayland Ormond did not "complete any paperwork correctly" on him in the office the "nurse had to complete the paperwork and it was not done right." I asked him what specific paperwork he was referring to. He did not reply but proceeded to reiterate that he was upset about his last visit with Grayland Ormond and that "Dr. Grayland Ormond left the room and he wants to know why." Pt inquired if we had any other providers that would see him today. "Dr. Grayland Ormond wasn't in the room long enough for me to answer any questions." I asked him if had any specific medical concerns that did not get addressed at his last visit.  He replied that he does not have any new medical concerns to speak to the provider about and that he is clearly upset at his "last apt not going well."

## 2022-07-02 NOTE — Progress Notes (Signed)
Patient not evaluated by Judson Roch, Cesar Chavez today. Blima Singer, RN and Brooke in [place] spoke to patient regarding his concerns. Patient left with his AVS print out with his recorded vitals from the 3/1 office visit.  Social worker referral entered v/o Dr. Grayland Ormond given pt's aggressive behavior and lack of social support.

## 2022-07-02 NOTE — Telephone Encounter (Signed)
ERx 

## 2022-07-03 ENCOUNTER — Inpatient Hospital Stay: Payer: Medicare Other | Admitting: Licensed Clinical Social Worker

## 2022-07-03 ENCOUNTER — Encounter: Payer: Self-pay | Admitting: Oncology

## 2022-07-03 DIAGNOSIS — C61 Malignant neoplasm of prostate: Secondary | ICD-10-CM

## 2022-07-03 NOTE — Progress Notes (Addendum)
[facility]  Initial Assessment   RUTILIO GRILLI is a 82 y.o. year old male contacted by phone. Clinical Social Work was referred by medical provider for assessment of psychosocial needs.   SDOH (Social Determinants of Health) assessments performed: Yes SDOH Interventions    Flowsheet Row Clinical Support from 07/03/2022 in [facility] at [place] from 07/04/2020 in [facility] at [place] from 06/30/2019 in [facility] at [place] from 06/22/2018 in [facility] at [place] Interventions Intervention Not Indicated -- -- --  Housing Interventions Intervention Not Indicated, Other (Comment)  [Patient currently living at [facility]] -- -- --  Transportation Interventions Intervention Not Indicated -- -- --  Utilities Interventions Intervention Not Indicated -- -- --  Alcohol Usage Interventions Intervention Not Indicated (Score <7) -- -- --  Depression Interventions/Treatment  -- PHQ2-9 Score <4 Follow-up Not Indicated PHQ2-9 Score <4 Follow-up Not Indicated PHQ2-9 Score <4 Follow-up Not Indicated  Financial Strain Interventions Intervention Not Indicated -- -- --  Physical Activity Interventions Intervention Not Indicated -- -- --  Social Connections Interventions Intervention Not Indicated -- -- --       SDOH Screenings   Food Insecurity: No Food Insecurity (07/03/2022)  Housing: Low Risk  (07/03/2022)  Transportation Needs: No Transportation Needs (07/03/2022)  Utilities: Not At Risk (07/03/2022)  Alcohol Screen: Low Risk  (07/03/2022)  Depression (PHQ2-9): Low Risk  (07/03/2022)  Financial Resource Strain: Low Risk  (07/03/2022)  Physical Activity: Inactive (07/03/2022)  Social Connections: Socially Isolated (07/03/2022)  Stress: No Stress Concern Present (07/03/2022)  Tobacco Use: Medium Risk (06/27/2022)      Distress Screen completed: No     No data to display            Family/Social Information:  Housing Arrangement: patient lives alone, patient is currently a resident at [facility] Family members/support persons in your life? Friends and Geophysical data processor concerns: no  Employment: Retired  .  Income source: Conservation officer, historic buildings and [place] Benefit Financial concerns: No Type of concern: None Food access concerns: no Religious or spiritual practice: Not known Services Currently in place:  [employer], [facility] Medicare  Coping/ Adjustment to diagnosis: Patient understands treatment plan and what happens next? yes Concerns about diagnosis and/or treatment:  no concerns present Patient reported stressors:  relationship with provider Hopes and/or priorities: N/A Patient enjoys  walking his neighbors dog Current coping skills/ strengths: Average or above average intelligence , Capable of independent living , Communication skills , Financial means , General fund of knowledge , Motivation for treatment/growth , Physical Health , and Work skills     SUMMARY: Current SDOH Barriers:  Limited social support, Mental Health Concerns , Family and relationship dysfunction, and Social Isolation  Clinical Social Work Clinical Goal(s):  No clinical social work goals at this time  Interventions: Discussed common feeling and emotions when being diagnosed with cancer, and the importance of support during treatment Informed patient of the support team roles and support services at [facility] Provided [place] contact information and encouraged patient to call with any questions or concerns Provided patient with information about CSW role in patient care and other available resources.  Patient came to the [facility] earlier in the week and expressed his displeasure with his provider interaction on 06/27/2022.  Patient spoke with Practice Administrator  on 07/01/2022, and continued to  express his anger due to provider interaction and time spent with provider during appointment. During this conversation the patient stated he did not remember speaking with the Practice Administrator earlier in the week. Practice Administrator updated CSW on conversation with patient and expressed concerns on patient's cognitive status. Practice Administrator requested CSW contact patient to discuss patient concerns and to determine if there were any indication of cognitive decline or confusion. Patient was in a good mood, was able to answer all questions clearly and correctly and recount the events which occurred this week and the reasons he was upset with his provider.  CSW updated Engineer, building services on conversation with patient, recounted that CSW did not find concerns on patient's cognitive function but recommended referral to neurologist to provide clarity on patient's cognitive status, if there were still concerns present.   Follow Up Plan: Patient will contact CSW with any support or resource needs Patient verbalizes understanding of plan: Yes    Markelle Najarian, LCSW

## 2022-07-08 ENCOUNTER — Other Ambulatory Visit: Payer: Self-pay | Admitting: *Deleted

## 2022-07-08 DIAGNOSIS — C61 Malignant neoplasm of prostate: Secondary | ICD-10-CM

## 2022-07-08 MED ORDER — ABIRATERONE ACETATE 250 MG PO TABS: 1000.0000 mg | ORAL_TABLET | Freq: Every day | ORAL | 5 refills | Status: DC

## 2022-07-08 NOTE — Telephone Encounter (Signed)
CBC with Differential Order: [id number] Status: Final result     Visible to patient: Yes (seen)     Next appt: 07/16/2022 at 02:30 PM in Family Medicine [facility] ADVISOR)     Dx: Prostate cancer [facility])   0 Result Notes           Component Ref Range & Units 11 d ago (06/27/22) 3 wk ago (06/17/22) 3 wk ago (06/17/22) 3 wk ago (06/17/22) 2 mo ago (04/24/22) 3 mo ago (03/27/22) 4 mo ago (03/02/22)  WBC 4.0 - 10.5 K/uL 5.4   4.2 3.3 Low  5.1 6.1  RBC 4.22 - 5.81 MIL/uL 4.06 Low    3.74 Low  3.94 Low  R 4.04 Low  4.06 Low   Hemoglobin 13.0 - 17.0 g/dL 12.4 Low  10.9 Low   11.8 Low  12.2 Low  12.6 Low  12.5 Low   HCT 39.0 - 52.0 % 37.1 Low  32.0 Low   34.6 Low  35.5 Low  37.1 Low  36.5 Low   MCV 80.0 - 100.0 fL 91.4   92.5 90.2 R 91.8 89.9  MCH 26.0 - 34.0 pg 30.5   31.6  31.2 30.8  MCHC 30.0 - 36.0 g/dL 33.4   34.1 34.4 34.0 34.2  RDW 11.5 - 15.5 % 13.6   13.4 13.9 13.1 13.1  Platelets 150 - 400 K/uL 210   179 183.0 R 190 203  nRBC 0.0 - 0.2 % 0.0   0.0 CM  0.0 0.0 CM  Neutrophils Relative % % 74  59   66   Neutro Abs 1.7 - 7.7 K/uL 4.0  2.5   3.4   Lymphocytes Relative % '18  30   25   '$ Lymphs Abs 0.7 - 4.0 K/uL 1.0  1.2   1.2   Monocytes Relative % '5  8   6   '$ Monocytes Absolute 0.1 - 1.0 K/uL 0.3  0.3   0.3   Eosinophils Relative % '2  2   2   '$ Eosinophils Absolute 0.0 - 0.5 K/uL 0.1  0.1   0.1   Basophils Relative % 0  1   0   Basophils Absolute 0.0 - 0.1 K/uL 0.0  0.0   0.0   Immature Granulocytes % 1  0   1   Abs Immature Granulocytes 0.00 - 0.07 K/uL 0.03  0.01 CM   0.04 CM   Comment: Performed at [facility], [address] [place], [place] [address]  Sodium   145 R       Potassium   3.4 Low  R       Chloride   107 R       BUN   12 R       Creatinine, Ser   1.30 High  R       Glucose, Bld   109 High  R, CM       Calcium, Ion   1.22 R       TCO2   26 R       Resulting Agency  [place] [facility] [place] [facility] [place] [facility] [place] [facility] [place] [facility] [place] [facility] [place]  CL[facility] LAB         Specimen Collected: 06/27/22 09:50 Last Resulted: 06/27/22 10:06      Lab Flowsheet      Order Details      View Encounter      Lab and Collection Details  Routing      Result History    View All Conversations on this Encounter      CM=Additional comments  R=Reference range differs from displayed range      Result Care Coordination   Patient Communication   Add Comments   Seen Back to Top      Other Results from 06/27/2022  Comprehensive metabolic panel Order: [id number] Status: Final result      Visible to patient: Yes (seen)      Next appt: 07/16/2022 at 02:30 PM in Family Medicine [facility] ADVISOR)      Dx: Prostate cancer [facility])    0 Result Notes             Component Ref Range & Units 11 d ago (06/27/22) 3 wk ago (06/17/22) 3 wk ago (06/17/22) 2 mo ago (04/24/22) 3 mo ago (03/27/22) 4 mo ago (03/02/22) 5 mo ago (01/29/22)  Sodium 135 - 145 mmol/L 140 145 142 144 R 141 142 141 R  Potassium 3.5 - 5.1 mmol/L 3.7 3.4 Low  3.3 Low  4.0 R 3.2 Low  3.9 4.1 R  Chloride 98 - 111 mmol/L 107 107 108 108 R 107 112 High  107 R  CO2 22 - 32 mmol/L '24  28 27 '$ R '25 23 27 '$ R  Glucose, Bld 70 - 99 mg/dL 96 109 High  CM 111 High  CM 103 High  94 CM 124 High  CM 144 High   Comment: Glucose reference range applies only to samples taken after fasting for at least 8 hours.  BUN 8 - 23 mg/dL '11 12 11 14 '$ R '13 17 15 '$ R  Creatinine, Ser 0.61 - 1.24 mg/dL 1.01 1.30 High  1.19 1.13 R 1.07 1.14 1.25 R  Calcium 8.9 - 10.3 mg/dL 9.0  9.0 8.8 R 9.0 9.5 9.2 R  Total Protein 6.5 - 8.1 g/dL 6.6  5.8 Low  6.3 R 7.1 6.9   Albumin 3.5 - 5.0 g/dL 3.9  3.4 Low  3.8 R 4.0 4.1   AST 15 - 41 U/L '18  17 14 '$ R 22 18   ALT 0 - 44 U/L '11  11 6 '$ R 10 8   Alkaline Phosphatase 38 - 126 U/L 59  62 57 R 56 52   Total Bilirubin 0.3 - 1.2 mg/dL 0.6  0.6 0.5 R 0.4 0.9   GFR, Estimated >60 mL/min >60  >60 CM  >60 CM >60 CM   Comment: (NOTE) Calculated using the CKD-EPI  Creatinine Equation (2021)  Anion gap 5 - '15 9  6 '$ CM  9 CM 7 CM   Comment: Performed at [facility], [address] [place], [place] [address]  Resulting Agency  [facility] [place] [facility] [place] [facility] [place] [facility] [place] [facility] [place] [facility]     PSA Order: [id number] Status: Final result      Visible to patient: Yes (seen)      Next appt: 07/16/2022 at 02:30 PM in Family Medicine [facility] ADVISOR)      Dx: Prostate cancer [facility])    0 Result Notes             Component Ref Range & Units 11 d ago (06/27/22) 3 mo ago (03/27/22) 6 mo ago (12/26/21) 9 mo ago (09/24/21) 1 yr ago (06/25/21) 1 yr ago (03/26/21) 1 yr ago (12/18/20)  Prostatic Specific Antigen 0.00 - 4.00 ng/mL 1.10 1.17 CM 0.74  CM 0.60 CM 0.97 CM 0.91 CM 0.82 CM  Comment: (NOTE) While PSA levels of <=4.00 ng/ml are reported as reference range, some men with levels below 4.00 ng/ml can have prostate cancer and many men with PSA above 4.00 ng/ml do not have prostate cancer.  Other tests such as free PSA, age specific reference ranges, PSA velocity and PSA doubling time may be helpful especially in men less than 12 years old. Performed at [facility], [place] [address] [place], [place] [address]  Resulting Agency  [facility] [place] [facility] [place] [facility] [place] [facility] [place] [facility] [place] [facility]         Specimen Collected: 06/27/22 09:49 Last Resulted: 06/27/22 16:51

## 2022-07-09 ENCOUNTER — Telehealth: Payer: Self-pay | Admitting: *Deleted

## 2022-07-09 NOTE — Telephone Encounter (Signed)
Patient called stating that he needs a refill of his cancer pills. I let hm know that refill was sent yesterday to the [place] in [place] and he went off stating that he requested his pills a week ago and that if he finds out that Dr Grayland Ormond is the cause of him not getting them, he is going to get his lawyer and sue him. He went off on how Dr Grayland Ormond got mad at his last visit and just walked out. I told him that I was sorry he did not had a good experience at his last visit. He cursed several times during our conversation when I tried to inform him that we only yesterday got a message that he needed a refill and that came from a Dr Bobbye Riggs at the [place] a PCP and asked if he knew who that was. I asked him who he asked for a refill from a week ago and he said he asked the [facility] and he was told by them that they were on the way and he said I better not find out the [place] had anything to do with me not getting them. I advised patient that he needs to speak with the [facility] about not getting his pills if they told him they are on the way. I also explained that if he is out of refills on his prescription it is up to him to call the pharmacy or our office to get more refills sent in. He wanted to argue stating that he has never had to do that before and that he has never had any problems until after his last OV He asked if we had any other doctors and said that he wants a different one and I offered to transfer him to scheduler to see about changing providers and he declined at this time stating that he just wants his pills for now and again cursed and said Dr Grayland Ormond better not be the reason he hasn't gotten them. I again reiterated that Dr Grayland Ormond sent his refill very shortly after we got the request that he needed it and that he needs to contact the pharmacy. He stated that he will give pharmacy a few more days to get it to him before he calls them. He then hung up on me

## 2022-07-14 NOTE — Telephone Encounter (Signed)
Pt has called and left a VM stating he has not gotten his pills yet and wants to move providers. He does not want to see Dr. Grayland Ormond again.

## 2022-07-16 ENCOUNTER — Ambulatory Visit (INDEPENDENT_AMBULATORY_CARE_PROVIDER_SITE_OTHER): Payer: Medicare Other

## 2022-07-16 VITALS — Ht 66.0 in | Wt 180.0 lb

## 2022-07-16 DIAGNOSIS — Z0000 Encounter for general adult medical examination without abnormal findings: Secondary | ICD-10-CM

## 2022-07-16 NOTE — Progress Notes (Signed)
I connected with  [name] on 07/16/22 by a audio enabled telemedicine application and verified that I am speaking with the correct person using two identifiers.  Patient Location: Home  Provider Location: Home Office  I discussed the limitations of evaluation and management by telemedicine. The patient expressed understanding and agreed to proceed.  Subjective:   JAYAN RAYMUNDO is a 82 y.o. male who presents for Medicare Annual/Subsequent preventive examination.  Review of Systems      Cardiac Risk Factors include: advanced age (>81men, >50 women);hypertension;male gender     Objective:    Today's Vitals   07/16/22 1440  Weight: 180 lb (81.6 kg)  Height: 5\' 6"  (1.676 m)   Body mass index is 29.05 kg/m.     07/16/2022    2:50 PM 06/27/2022   10:13 AM 03/27/2022   10:13 AM 03/02/2022    1:08 PM 01/02/2022    9:05 AM 12/26/2021    2:52 PM 12/26/2021    2:43 PM  Advanced Directives  Does Patient Have a Medical Advance Directive? Yes No No Unable to assess, patient is non-responsive or altered mental status No Yes No  Type of Paramedic of [place];Living will     [place];Living will   Does patient want to make changes to medical advance directive? No - Patient declined        Copy of [place] in Chart? Yes - validated most recent copy scanned in chart (See row information)        Would patient like information on creating a medical advance directive? No - Patient declined No - Guardian declined No - Patient declined  No - Patient declined  No - Patient declined    Current Medications (verified) Outpatient Encounter Medications as of 07/16/2022  Medication Sig   abiraterone acetate (ZYTIGA) 250 MG tablet Take 4 tablets (1,000 mg total) by mouth daily. Take on an empty stomach 1 hour before or 2 hours after a meal   amLODipine (NORVASC) 5 MG tablet Take 1 tablet (5 mg total) by mouth daily.   CALCIUM PO Take  1,200 mg by mouth daily.   cholecalciferol (VITAMIN D) 1000 UNITS tablet Take 2,000 Units by mouth daily.   gabapentin (NEURONTIN) 300 MG capsule Take 1 capsule (300 mg total) by mouth at bedtime.   HYDROcodone-acetaminophen (NORCO/VICODIN) 5-325 MG tablet Take 1 tablet by mouth 3 (three) times daily as needed for moderate pain.   lisinopril (PRINIVIL,ZESTRIL) 5 MG tablet Take 1 tablet (5 mg total) by mouth daily.   Multiple Vitamins-Minerals (CENTRUM SILVER 50+MEN) TABS Take by mouth daily.   Omega-3 Fatty Acids (FISH OIL) 1000 MG CAPS Take 2 capsules (2,000 mg total) by mouth daily.   predniSONE (DELTASONE) 5 MG tablet Take 1 tablet (5 mg total) by mouth daily with breakfast.   temazepam (RESTORIL) 30 MG capsule Take 1 capsule (30 mg total) by mouth at bedtime.   traMADol (ULTRAM) 50 MG tablet Take 1 tablet (50 mg total) by mouth 2 (two) times daily as needed.   traZODone (DESYREL) 50 MG tablet Take 1 tablet (50 mg total) by mouth 2 (two) times daily.   clotrimazole (LOTRIMIN) 1 % cream Apply 1 application topically 2 (two) times daily. For 2 weeks or until full resolution (Patient not taking: Reported on 07/16/2022)   ondansetron (ZOFRAN) 4 MG tablet Take 1 tablet (4 mg total) by mouth daily as needed for nausea or vomiting. (Patient not taking:  Reported on 07/16/2022)   pantoprazole (PROTONIX) 40 MG tablet Take 1 tablet (40 mg total) by mouth every morning. (Patient not taking: Reported on 07/16/2022)   [DISCONTINUED] abiraterone acetate (ZYTIGA) 250 MG tablet Take 4 tablets (1,000 mg total) by mouth daily. Take on an empty stomach 1 hour before or 2 hours after a meal   [DISCONTINUED] temazepam (RESTORIL) 30 MG capsule Take 1 capsule (30 mg total) by mouth at bedtime.   No facility-administered encounter medications on file as of 07/16/2022.    Allergies (verified) Patient has no known allergies.   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   2 mm  - non-bleeding 03/18/2013 colonoscopy    CAD (coronary artery disease), native coronary artery 2014   By CT lung (04/2013)    Cataract    Gastric ulcer    HLD (hyperlipidemia)    HTN (hypertension)    Hyperglycemia    NAFLD (nonalcoholic fatty liver disease) 08/2011   by abd [place] with increased LFTs   OSA (obstructive sleep apnea)    did not tolerate CPAP   Other benign neoplasm of connective and other soft tissue of unspecified site    Neurofibroma of lateral periorbital area   Peripheral neuropathy    Personal history of colonic adenomas 08/05/2012   Pneumonia 01/2013   CAP LUL   Restless legs syndrome (RLS)    Sleep apnea    no cpap   Past Surgical History:  Procedure Laterality Date   COLONOSCOPY  07/2012   5 adenomas (tubular, TV, serrated), rec rpt 5 months [name])   COLONOSCOPY  02/2013   residual adenomas, cecal AVM, rec rpt 1 yr [name])   COLONOSCOPY  03/2014   no residual polyp, cecal AVM, rpt 3-4 yrs [name])   ECTROPION REPAIR Bilateral 09/29/2017   Procedure: REPAIR OF ECTROPION, EXTENSIVE UPPER AND LOWER;  Surgeon: Karle Starch, MD;  Location: [facility];  Service: Ophthalmology;  Laterality: Bilateral;   ESOPHAGOGASTRODUODENOSCOPY (EGD) WITH PROPOFOL N/A 01/02/2022   normal esophagus, erosive gastropathy without recent bleed, nonbleeding gastric ulcers and duodenal ulcer with flat pigmented spotWohl, [name], MD)   ORIF ANKLE FRACTURE  11/29/2000   Dr. Tamala Julian   PTOSIS REPAIR Bilateral 09/29/2017   Procedure: BLEPHAROPTOSIS REPAIR RESECT EX UPPER;  Surgeon: Karle Starch, MD;  Location: [facility];  Service: Ophthalmology;  Laterality: Bilateral;   RECONSTRUCTION OF EYELID Bilateral 05/31/2018   SKIN CANCER EXCISION  1997   left anterior neck   Family History  Problem Relation Age of Onset   Cancer Father        liver   Alcohol abuse Father    Liver cancer Father    Cancer Mother        breast   Breast cancer Mother    Hypertension  Brother        Half-brother   Coronary artery disease Paternal Aunt        CABG   Cancer Other        GM; Aunt - breast   Colon cancer Neg Hx    Rectal cancer Neg Hx    Stomach cancer Neg Hx    Esophageal cancer Neg Hx    Social History   Socioeconomic History   Marital status: Legally Separated    Spouse name: Not on file   Number of children: 2   Years of education: Not on file   Highest education level: Not on file  Occupational  History   Occupation: Administrator    Comment: Museum/gallery exhibitions officer  Tobacco Use   Smoking status: Former    Types: Cigarettes, Cigars    Quit date: 04/28/2002    Years since quitting: 20.2   Smokeless tobacco: Never   Tobacco comments:    rare cigar  Vaping Use   Vaping Use: Never used  Substance and Sexual Activity   Alcohol use: Not Currently   Drug use: No   Sexual activity: Not on file  Other Topics Concern   Not on file  Social History Narrative   Caffeine: occasional coffee   Lives with wife, 1 cat   Some care through the [place] - served 8 years in the [employer]   Occupation: truck driver Adult nurse)   Activity: goes to [employer] 3x/wk   Diet: rare water, lots of sweet tea, some fruits/vegetables   Social Determinants of Health   Financial Resource Strain: Low Risk  (07/16/2022)   Overall Financial Resource Strain (CARDIA)    Difficulty of Paying Living Expenses: Not hard at all  Food Insecurity: No Food Insecurity (07/16/2022)   Hunger Vital Sign    Worried About Running Out of Food in the Last Year: Never true    [place] in the Last Year: Never true  Transportation Needs: No Transportation Needs (07/16/2022)   PRAPARE - Hydrologist (Medical): No    Lack of Transportation (Non-Medical): No  Physical Activity: Insufficiently Active (07/16/2022)   Exercise Vital Sign    Days of Exercise per Week: 2 days    Minutes of Exercise per Session: 30 min  Stress: Stress Concern Present (07/16/2022)   [place]    Feeling of Stress : To some extent  Social Connections: Socially Isolated (07/16/2022)   Social Connection and Isolation Panel [NHANES]    Frequency of Communication with Friends and Family: More than three times a week    Frequency of Social Gatherings with Friends and Family: More than three times a week    Attends Religious Services: Never    Marine scientist or Organizations: No    Attends Music therapist: Never    Marital Status: Separated    Tobacco Counseling Counseling given: Not Answered Tobacco comments: rare cigar   Clinical Intake:  Pre-visit preparation completed: Yes  Pain : No/denies pain     Nutritional Risks: None Diabetes: No  How often do you need to have someone help you when you read instructions, pamphlets, or other written materials from your doctor or pharmacy?: 1 - Never  Diabetic? no  Interpreter Needed?: No  Information entered by :: C.Derotha Fishbaugh LPN   Activities of Daily Living    07/16/2022    2:51 PM 01/02/2022    2:00 PM  In your present state of health, do you have any difficulty performing the following activities:  Hearing? 0 0  Vision? 0 0  Difficulty concentrating or making decisions? 0 0  Walking or climbing stairs? 0 0  Dressing or bathing? 0 0  Doing errands, shopping? 0 0  Preparing Food and eating ? N   Using the Toilet? N   In the past six months, have you accidently leaked urine? N   Do you have problems with loss of bowel control? N   Managing your Medications? N   Managing your Finances? N   Housekeeping or managing your Housekeeping? N  Patient Care Team: Ria Bush, MD as PCP - General (Family Medicine) Birder Robson, MD as Referring Physician (Ophthalmology) Lloyd Huger, MD as Consulting Physician (Oncology)  Indicate any recent Medical Services you may have received from other than [facility] providers in  the past year (date may be approximate).     Assessment:   This is a routine wellness examination for Draegan.  Hearing/Vision screen Hearing Screening - Comments:: No aids Vision Screening - Comments:: None - Goes to the [place]  Dietary issues and exercise activities discussed: Current Exercise Habits: Home exercise routine, Type of exercise: strength training/weights, Time (Minutes): 30, Intensity: Moderate, Exercise limited by: None identified   Goals Addressed             This Visit's Progress    Patient Stated       No new goals.       Depression Screen    07/16/2022    2:48 PM 07/03/2022   10:14 AM 07/05/2021    2:08 PM 07/04/2020    2:03 PM 06/30/2019   12:12 PM 06/22/2018    8:19 AM 06/16/2017    9:20 AM  PHQ 2/9 Scores  PHQ - 2 Score 0 0 0 0 0 0 0  PHQ- 9 Score    0 0 0 0    Fall Risk    07/16/2022    2:42 PM 07/05/2021    2:07 PM 07/04/2020    2:02 PM 06/30/2019   12:09 PM 06/22/2018    8:19 AM  Fall Risk   Falls in the past year? 0 0 1 1 0  Comment    fell on ice   Number falls in past yr: 0 0 0 0   Injury with Fall? 0 0 0 0   Risk for fall due to : No Fall Risks No Fall Risks Medication side effect Medication side effect   Follow up Falls prevention discussed;Falls evaluation completed Falls prevention discussed Falls evaluation completed;Falls prevention discussed Falls evaluation completed;Falls prevention discussed     FALL RISK PREVENTION PERTAINING TO THE HOME:  Any stairs in or around the home? Yes  If so, are there any without handrails? No  Home free of loose throw rugs in walkways, pet beds, electrical cords, etc? Yes  Adequate lighting in your home to reduce risk of falls? Yes   ASSISTIVE DEVICES UTILIZED TO PREVENT FALLS:  Life alert? No  Use of a cane, walker or w/c? No  Grab bars in the bathroom? Yes  Shower chair or bench in shower? No  Elevated toilet seat or a handicapped toilet? No    Cognitive Function:    07/04/2020    2:14 PM  06/30/2019   12:13 PM 06/22/2018    8:28 AM 06/16/2017    9:18 AM 06/13/2016    8:40 AM  MMSE - Mini Mental State Exam  Not completed: Refused      Orientation to time  5 5 5 5   Orientation to Place  5 5 5 5   Registration  3 3 3 3   Attention/ Calculation  5 0 0 0  Recall  3 1 3 3   Recall-comments   unable to recall 2 of 3 words    Language- name 2 objects   0 0 0  Language- repeat  1 1 1 1   Language- follow 3 step command   3 3 3   Language- read & follow direction   0 0 0  Write a sentence   0  0 0  Copy design   0 0 0  Total score   18 20 20         07/16/2022    2:52 PM  6CIT Screen  What Year? 0 points  What month? 3 points  What time? 0 points  Count back from 20 0 points  Months in reverse 4 points  Repeat phrase 2 points  Total Score 9 points    Immunizations Immunization History  Administered Date(s) Administered   COVID-19, mRNA, vaccine(Comirnaty)12 years and older 05/05/2022   Fluad Quad(high Dose 65+) 12/28/2018, 03/14/2022   Influenza Whole 01/24/2008, 01/08/2009, 02/08/2013   Influenza, High Dose Seasonal PF 01/26/2018, 12/28/2018   Influenza, Seasonal, Injecte, Preservative Fre 01/04/2014   Influenza,inj,Quad PF,6+ Mos 01/02/2015, 01/09/2016, 01/02/2017, 01/14/2018   PFIZER(Purple Top)SARS-COV-2 Vaccination 05/12/2019, 06/02/2019, 04/26/2020   Pneumococcal Conjugate-13 06/02/2013   Pneumococcal Polysaccharide-23 06/12/2010   Td 08/27/2002, 04/28/2010   Tdap 04/28/2010    TDAP status: Due, Education has been provided regarding the importance of this vaccine. Advised may receive this vaccine at local pharmacy or Health Dept. Aware to provide a copy of the vaccination record if obtained from local pharmacy or Health Dept. Verbalized acceptance and understanding.  Flu Vaccine status: Up to date  Pneumococcal vaccine status: Up to date  Covid-19 vaccine status: Information provided on how to obtain vaccines.   Qualifies for Shingles Vaccine? No   Zostavax  completed No   Shingrix Completed?: No.    Education has been provided regarding the importance of this vaccine. Patient has been advised to call insurance company to determine out of pocket expense if they have not yet received this vaccine. Advised may also receive vaccine at local pharmacy or Health Dept. Verbalized acceptance and understanding.  Screening Tests Health Maintenance  Topic Date Due   Zoster Vaccines- Shingrix (1 of 2) Never done   COLONOSCOPY (Pts 45-36yrs Insurance coverage will need to be confirmed)  03/22/2017   DTaP/Tdap/Td (4 - Td or Tdap) 04/28/2020   COVID-19 Vaccine (5 - 2023-24 season) 06/30/2022   Medicare Annual Wellness (AWV)  07/16/2023   Pneumonia Vaccine 36+ Years old  Completed   INFLUENZA VACCINE  Completed   [facility] [place] Maintenance Due  Topic Date Due   Zoster Vaccines- Shingrix (1 of 2) Never done   COLONOSCOPY (Pts 45-3yrs Insurance coverage will need to be confirmed)  03/22/2017   DTaP/Tdap/Td (4 - Td or Tdap) 04/28/2020   COVID-19 Vaccine (5 - 2023-24 season) 06/30/2022    Colorectal cancer screening: No longer required.   Lung Cancer Screening: (Low Dose CT Chest recommended if Age 81-80 years, 30 pack-year currently smoking OR have quit w/in 15years.) does not qualify.   Lung Cancer Screening Referral: no  Additional Screening:  Hepatitis C Screening: does not qualify; Completed no  Vision Screening: Recommended annual ophthalmology exams for early detection of glaucoma and other disorders of the eye. Is the patient up to date with their annual eye exam?  Yes  Who is the provider or what is the name of the office in which the patient attends [facility] exams? [facility] If pt is not established with a provider, would they like to be referred to a provider to establish care? No .   Dental Screening: Recommended annual dental exams for proper oral hygiene  Community Resource Referral / Chronic Care  Management: CRR required this visit?  No   CCM required this visit?  No  Plan:     I have personally reviewed and noted the following in the patient's chart:   Medical and social history Use of alcohol, tobacco or illicit drugs  Current medications and supplements including opioid prescriptions. Patient is currently taking opioid prescriptions. Information provided to patient regarding non-opioid alternatives. Patient advised to discuss non-opioid treatment plan with their provider. Functional ability and status Nutritional status Physical activity Advanced directives List of other physicians Hospitalizations, surgeries, and ER visits in previous 12 months Vitals Screenings to include cognitive, depression, and falls Referrals and appointments  In addition, I have reviewed and discussed with patient certain preventive protocols, quality metrics, and best practice recommendations. A written personalized care plan for preventive services as well as general preventive health recommendations were provided to patient.     Lebron Conners, LPN   [id number]   Nurse Notes: Declined Shingles vaccine.

## 2022-07-16 NOTE — Patient Instructions (Signed)
Mr. Justin Ali , Thank you for taking time to come for your Medicare Wellness Visit. I appreciate your ongoing commitment to your health goals. Please review the following plan we discussed and let me know if I can assist you in the future.   These are the goals we discussed:  Goals      Patient Stated     Starting 06/22/18, I will continue to take medications as prescribed.      Patient Stated     06/30/2019, I will maintain and continue to take medications as prescribed.     Patient Stated     07/04/2020, I will continue to go to the gym 3 days a week for 1 hour.      Patient Stated     Would like to maintain current routine      Patient Stated     No new goals.        This is a list of the screening recommended for you and due dates:  Health Maintenance  Topic Date Due   Zoster (Shingles) Vaccine (1 of 2) Never done   Colon Cancer Screening  03/22/2017   DTaP/Tdap/Td vaccine (4 - Td or Tdap) 04/28/2020   COVID-19 Vaccine (5 - 2023-24 season) 06/30/2022   Medicare Annual Wellness Visit  07/16/2023   Pneumonia Vaccine  Completed   Flu Shot  Completed   HPV Vaccine  Aged Out    Advanced directives: copy is in the chart.  Conditions/risks identified: none  Next appointment: Follow up in one year for your annual wellness visit. 07/21/23 @ 9:00am telephone visit.  Preventive Care 40 Years and Older, Male  Preventive care refers to lifestyle choices and visits with your health care provider that can promote health and wellness. What does preventive care include? A yearly physical exam. This is also called an annual well check. Dental exams once or twice a year. Routine eye exams. Ask your health care provider how often you should have your eyes checked. Personal lifestyle choices, including: Daily care of your teeth and gums. Regular physical activity. Eating a healthy diet. Avoiding tobacco and drug use. Limiting alcohol use. Practicing safe sex. Taking low doses of  aspirin every day. Taking vitamin and mineral supplements as recommended by your health care provider. What happens during an annual well check? The services and screenings done by your health care provider during your annual well check will depend on your age, overall health, lifestyle risk factors, and family history of disease. Counseling  Your health care provider may ask you questions about your: Alcohol use. Tobacco use. Drug use. Emotional well-being. Home and relationship well-being. Sexual activity. Eating habits. History of falls. Memory and ability to understand (cognition). Work and work Statistician. Screening  You may have the following tests or measurements: Height, weight, and BMI. Blood pressure. Lipid and cholesterol levels. These may be checked every 5 years, or more frequently if you are over 42 years old. Skin check. Lung cancer screening. You may have this screening every year starting at age 51 if you have a 30-pack-year history of smoking and currently smoke or have quit within the past 15 years. Fecal occult blood test (FOBT) of the stool. You may have this test every year starting at age 68. Flexible sigmoidoscopy or colonoscopy. You may have a sigmoidoscopy every 5 years or a colonoscopy every 10 years starting at age 75. Prostate cancer screening. Recommendations will vary depending on your family history and other risks. Hepatitis C blood  test. Hepatitis B blood test. Sexually transmitted disease (STD) testing. Diabetes screening. This is done by checking your blood sugar (glucose) after you have not eaten for a while (fasting). You may have this done every 1-3 years. Abdominal aortic aneurysm (AAA) screening. You may need this if you are a current or former smoker. Osteoporosis. You may be screened starting at age 48 if you are at high risk. Talk with your health care provider about your test results, treatment options, and if necessary, the need for more  tests. Vaccines  Your health care provider may recommend certain vaccines, such as: Influenza vaccine. This is recommended every year. Tetanus, diphtheria, and acellular pertussis (Tdap, Td) vaccine. You may need a Td booster every 10 years. Zoster vaccine. You may need this after age 40. Pneumococcal 13-valent conjugate (PCV13) vaccine. One dose is recommended after age 49. Pneumococcal polysaccharide (PPSV23) vaccine. One dose is recommended after age 69. Talk to your health care provider about which screenings and vaccines you need and how often you need them. This information is not intended to replace advice given to you by your health care provider. Make sure you discuss any questions you have with your health care provider. Document Released: 05/11/2015 Document Revised: 01/02/2016 Document Reviewed: 02/13/2015 Elsevier Interactive Patient Education  2017 [place] Prevention in the Home Falls can cause injuries. They can happen to people of all ages. There are many things you can do to make your home safe and to help prevent falls. What can I do on the outside of my home? Regularly fix the edges of walkways and driveways and fix any cracks. Remove anything that might make you trip as you walk through a door, such as a raised step or threshold. Trim any bushes or trees on the path to your home. Use bright outdoor lighting. Clear any walking paths of anything that might make someone trip, such as rocks or tools. Regularly check to see if handrails are loose or broken. Make sure that both sides of any steps have handrails. Any raised decks and porches should have guardrails on the edges. Have any leaves, snow, or ice cleared regularly. Use sand or salt on walking paths during winter. Clean up any spills in your garage right away. This includes oil or grease spills. What can I do in the bathroom? Use night lights. Install grab bars by the toilet and in the tub and shower.  Do not use towel bars as grab bars. Use non-skid mats or decals in the tub or shower. If you need to sit down in the shower, use a plastic, non-slip stool. Keep the floor dry. Clean up any water that spills on the floor as soon as it happens. Remove soap buildup in the tub or shower regularly. Attach bath mats securely with double-sided non-slip rug tape. Do not have throw rugs and other things on the floor that can make you trip. What can I do in the bedroom? Use night lights. Make sure that you have a light by your bed that is easy to reach. Do not use any sheets or blankets that are too big for your bed. They should not hang down onto the floor. Have a firm chair that has side arms. You can use this for support while you get dressed. Do not have throw rugs and other things on the floor that can make you trip. What can I do in the kitchen? Clean up any spills right away. Avoid walking on wet  floors. Keep items that you use a lot in easy-to-reach places. If you need to reach something above you, use a strong step stool that has a grab bar. Keep electrical cords out of the way. Do not use floor polish or wax that makes floors slippery. If you must use wax, use non-skid floor wax. Do not have throw rugs and other things on the floor that can make you trip. What can I do with my stairs? Do not leave any items on the stairs. Make sure that there are handrails on both sides of the stairs and use them. Fix handrails that are broken or loose. Make sure that handrails are as long as the stairways. Check any carpeting to make sure that it is firmly attached to the stairs. Fix any carpet that is loose or worn. Avoid having throw rugs at the top or bottom of the stairs. If you do have throw rugs, attach them to the floor with carpet tape. Make sure that you have a light switch at the top of the stairs and the bottom of the stairs. If you do not have them, ask someone to add them for you. What else  can I do to help prevent falls? Wear shoes that: Do not have high heels. Have rubber bottoms. Are comfortable and fit you well. Are closed at the toe. Do not wear sandals. If you use a stepladder: Make sure that it is fully opened. Do not climb a closed stepladder. Make sure that both sides of the stepladder are locked into place. Ask someone to hold it for you, if possible. Clearly mark and make sure that you can see: Any grab bars or handrails. First and last steps. Where the edge of each step is. Use tools that help you move around (mobility aids) if they are needed. These include: Canes. Walkers. Scooters. Crutches. Turn on the lights when you go into a dark area. Replace any light bulbs as soon as they burn out. Set up your furniture so you have a clear path. Avoid moving your furniture around. If any of your floors are uneven, fix them. If there are any pets around you, be aware of where they are. Review your medicines with your doctor. Some medicines can make you feel dizzy. This can increase your chance of falling. Ask your doctor what other things that you can do to help prevent falls. This information is not intended to replace advice given to you by your health care provider. Make sure you discuss any questions you have with your health care provider. Document Released: 02/08/2009 Document Revised: 09/20/2015 Document Reviewed: 05/19/2014 Elsevier Interactive Patient Education  2017 [employer].

## 2022-07-29 ENCOUNTER — Encounter: Payer: Self-pay | Admitting: Oncology

## 2022-07-29 ENCOUNTER — Encounter: Payer: Self-pay | Admitting: Licensed Clinical Social Worker

## 2022-07-29 NOTE — Progress Notes (Addendum)
[facility] CSW Progress Note  Clinical Social Worker  contacted [employer] [facility] [employer]   to make a safety concerns report for patient.  Patient arrived at the cancer center agitated, demanding to receive his "shot" and refusing to believe information from front desk staff and practice administrator that his next appointment is in June.  Patient became increasingly agitated and security was contacted.  Patient is increasingly confused and paranoid and there are concerns for his safety and the safety of others, since the patient is still driving.  CSW made report and spoke with Carollee Herter, she stated someone from [facility] would contact CSW.    Adelene Amas, LCSW

## 2022-07-29 NOTE — Progress Notes (Signed)
Mr. Pirolli return to the Cancer center again this morning. Pt displayed same behaviors he had with our previous two encounters. Mr. Vandruff couldn't remember the month, our previous conversations, he asked me if I was going to be his new doctor. Patient seems to have declined significantly since our last encounter. His cognitive ability is very questionable. I'm very concerned due to the patient continually transporting himself to the campus. I spoke with [place] our Education officer, museum and she will contact [employer] on his behalf. Security was called to ensure the encounter went smoothly. Johnny the Animal nutritionist assisted the patient to his car after the encounter.

## 2022-07-31 ENCOUNTER — Encounter: Payer: Self-pay | Admitting: Licensed Clinical Social Worker

## 2022-07-31 NOTE — Progress Notes (Signed)
[facility] CSW Progress Note  Education officer, environmental from [place] [phone] at [place], with update  to confirm case has been opened for patient ans social worker will be assigned to investigate safety concerns. Practice Admin, front desk and provider updated.   Adelene Amas, LCSW

## 2022-08-04 ENCOUNTER — Other Ambulatory Visit: Payer: Self-pay | Admitting: Family Medicine

## 2022-08-04 ENCOUNTER — Other Ambulatory Visit: Payer: Self-pay | Admitting: Oncology

## 2022-08-04 DIAGNOSIS — C61 Malignant neoplasm of prostate: Secondary | ICD-10-CM

## 2022-08-04 NOTE — Telephone Encounter (Signed)
Refill request Temazepam Last refill 07/02/22 #30 Last office visit 04/24/22 acute

## 2022-08-07 NOTE — Telephone Encounter (Signed)
Noted  

## 2022-08-07 NOTE — Telephone Encounter (Signed)
ERx Overdue for appt - please call to schedule

## 2022-08-07 NOTE — Telephone Encounter (Signed)
Pt had MCR wellness on 07/16/22. Plz schedule CPE and fasting lab visits for additional refills.

## 2022-08-07 NOTE — Telephone Encounter (Signed)
Scheduled labs 4/19 and CPE 4/24

## 2022-08-09 ENCOUNTER — Other Ambulatory Visit: Payer: Self-pay | Admitting: Family Medicine

## 2022-08-09 DIAGNOSIS — E785 Hyperlipidemia, unspecified: Secondary | ICD-10-CM

## 2022-08-09 DIAGNOSIS — A530 Latent syphilis, unspecified as early or late: Secondary | ICD-10-CM

## 2022-08-09 DIAGNOSIS — R7989 Other specified abnormal findings of blood chemistry: Secondary | ICD-10-CM

## 2022-08-09 DIAGNOSIS — N1831 Chronic kidney disease, stage 3a: Secondary | ICD-10-CM

## 2022-08-09 DIAGNOSIS — E538 Deficiency of other specified B group vitamins: Secondary | ICD-10-CM

## 2022-08-09 DIAGNOSIS — R413 Other amnesia: Secondary | ICD-10-CM

## 2022-08-09 DIAGNOSIS — C61 Malignant neoplasm of prostate: Secondary | ICD-10-CM

## 2022-08-15 ENCOUNTER — Other Ambulatory Visit: Payer: Self-pay | Admitting: Family Medicine

## 2022-08-15 ENCOUNTER — Other Ambulatory Visit (INDEPENDENT_AMBULATORY_CARE_PROVIDER_SITE_OTHER): Payer: Medicare Other

## 2022-08-15 DIAGNOSIS — N1831 Chronic kidney disease, stage 3a: Secondary | ICD-10-CM

## 2022-08-15 DIAGNOSIS — E538 Deficiency of other specified B group vitamins: Secondary | ICD-10-CM

## 2022-08-15 DIAGNOSIS — A530 Latent syphilis, unspecified as early or late: Secondary | ICD-10-CM

## 2022-08-15 DIAGNOSIS — E785 Hyperlipidemia, unspecified: Secondary | ICD-10-CM | POA: Diagnosis not present

## 2022-08-15 DIAGNOSIS — R7989 Other specified abnormal findings of blood chemistry: Secondary | ICD-10-CM | POA: Diagnosis not present

## 2022-08-15 DIAGNOSIS — R413 Other amnesia: Secondary | ICD-10-CM | POA: Diagnosis not present

## 2022-08-15 DIAGNOSIS — F5104 Psychophysiologic insomnia: Secondary | ICD-10-CM

## 2022-08-15 LAB — CBC WITH DIFFERENTIAL/PLATELET
Basophils Absolute: 0 10*3/uL (ref 0.0–0.1)
Basophils Relative: 0.6 % (ref 0.0–3.0)
Eosinophils Absolute: 0.1 10*3/uL (ref 0.0–0.7)
Eosinophils Relative: 2.5 % (ref 0.0–5.0)
HCT: 37.2 % — ABNORMAL LOW (ref 39.0–52.0)
Hemoglobin: 12.6 g/dL — ABNORMAL LOW (ref 13.0–17.0)
Lymphocytes Relative: 33.3 % (ref 12.0–46.0)
Lymphs Abs: 1.6 10*3/uL (ref 0.7–4.0)
MCHC: 33.8 g/dL (ref 30.0–36.0)
MCV: 91.8 fl (ref 78.0–100.0)
Monocytes Absolute: 0.4 10*3/uL (ref 0.1–1.0)
Monocytes Relative: 8.5 % (ref 3.0–12.0)
Neutro Abs: 2.6 10*3/uL (ref 1.4–7.7)
Neutrophils Relative %: 55.1 % (ref 43.0–77.0)
Platelets: 196 10*3/uL (ref 150.0–400.0)
RBC: 4.05 Mil/uL — ABNORMAL LOW (ref 4.22–5.81)
RDW: 14.7 % (ref 11.5–15.5)
WBC: 4.7 10*3/uL (ref 4.0–10.5)

## 2022-08-15 LAB — RENAL FUNCTION PANEL
Albumin: 4.2 g/dL (ref 3.5–5.2)
BUN: 17 mg/dL (ref 6–23)
CO2: 29 mEq/L (ref 19–32)
Calcium: 9.4 mg/dL (ref 8.4–10.5)
Chloride: 107 mEq/L (ref 96–112)
Creatinine, Ser: 1.45 mg/dL (ref 0.40–1.50)
GFR: 45.44 mL/min — ABNORMAL LOW (ref >60.00)
Glucose, Bld: 88 mg/dL (ref 70–99)
Phosphorus: 4.5 mg/dL (ref 2.3–4.6)
Potassium: 3.9 mEq/L (ref 3.5–5.1)
Sodium: 145 mEq/L (ref 135–145)

## 2022-08-15 LAB — LIPID PANEL
Cholesterol: 140 mg/dL (ref 0–200)
HDL: 34.4 mg/dL — ABNORMAL LOW (ref >39.00)
LDL Cholesterol: 75 mg/dL (ref 0–99)
NonHDL: 105.15
Total CHOL/HDL Ratio: 4
Triglycerides: 153 mg/dL — ABNORMAL HIGH (ref 0.0–149.0)
VLDL: 30.6 mg/dL (ref 0.0–40.0)

## 2022-08-15 LAB — MICROALBUMIN / CREATININE URINE RATIO
Creatinine,U: 462.1 mg/dL
Microalb Creat Ratio: 1.3 mg/g (ref 0.0–30.0)
Microalb, Ur: 5.8 mg/dL — ABNORMAL HIGH (ref 0.0–1.9)

## 2022-08-15 LAB — VITAMIN D 25 HYDROXY (VIT D DEFICIENCY, FRACTURES): VITD: 83.03 ng/mL (ref 30.00–100.00)

## 2022-08-15 LAB — TSH: TSH: 1.53 u[IU]/mL (ref 0.35–5.50)

## 2022-08-15 LAB — T4, FREE: Free T4: 1.03 ng/dL (ref 0.60–1.60)

## 2022-08-15 LAB — VITAMIN B12: Vitamin B-12: 856 pg/mL (ref 211–911)

## 2022-08-15 NOTE — Telephone Encounter (Signed)
Sig was changed to 1 tablet BID (see 03/15/23 OV notes).   Corrected sig on rx and e-scribed refill. Fyi to Dr. Reece Agar

## 2022-08-16 LAB — RPR TITER: RPR Titer: 1:1 {titer} — ABNORMAL HIGH

## 2022-08-16 MED ORDER — TRAZODONE HCL 50 MG PO TABS: 50.0000 mg | ORAL_TABLET | Freq: Two times a day (BID) | ORAL | 0 refills | Status: DC

## 2022-08-16 NOTE — Addendum Note (Signed)
Addended by: Eustaquio Boyden on: 08/16/2022 10:01 AM   Modules accepted: Orders

## 2022-08-19 LAB — RPR: RPR Ser Ql: REACTIVE — AB

## 2022-08-20 ENCOUNTER — Ambulatory Visit (INDEPENDENT_AMBULATORY_CARE_PROVIDER_SITE_OTHER): Payer: Medicare Other | Admitting: Family Medicine

## 2022-08-20 ENCOUNTER — Encounter: Payer: Self-pay | Admitting: Family Medicine

## 2022-08-20 VITALS — BP 132/70 | HR 66 | Temp 97.4°F | Ht 65.0 in | Wt 168.1 lb

## 2022-08-20 DIAGNOSIS — I10 Essential (primary) hypertension: Secondary | ICD-10-CM

## 2022-08-20 DIAGNOSIS — C799 Secondary malignant neoplasm of unspecified site: Secondary | ICD-10-CM

## 2022-08-20 DIAGNOSIS — H0220C Unspecified lagophthalmos, bilateral, upper and lower eyelids: Secondary | ICD-10-CM

## 2022-08-20 DIAGNOSIS — E785 Hyperlipidemia, unspecified: Secondary | ICD-10-CM | POA: Diagnosis not present

## 2022-08-20 DIAGNOSIS — Z8719 Personal history of other diseases of the digestive system: Secondary | ICD-10-CM | POA: Diagnosis not present

## 2022-08-20 DIAGNOSIS — R4189 Other symptoms and signs involving cognitive functions and awareness: Secondary | ICD-10-CM

## 2022-08-20 DIAGNOSIS — C61 Malignant neoplasm of prostate: Secondary | ICD-10-CM

## 2022-08-20 DIAGNOSIS — A530 Latent syphilis, unspecified as early or late: Secondary | ICD-10-CM

## 2022-08-20 DIAGNOSIS — R4689 Other symptoms and signs involving appearance and behavior: Secondary | ICD-10-CM

## 2022-08-20 DIAGNOSIS — N1831 Chronic kidney disease, stage 3a: Secondary | ICD-10-CM

## 2022-08-20 DIAGNOSIS — R413 Other amnesia: Secondary | ICD-10-CM | POA: Diagnosis not present

## 2022-08-20 DIAGNOSIS — C7951 Secondary malignant neoplasm of bone: Secondary | ICD-10-CM

## 2022-08-20 DIAGNOSIS — E663 Overweight: Secondary | ICD-10-CM

## 2022-08-20 DIAGNOSIS — F5104 Psychophysiologic insomnia: Secondary | ICD-10-CM

## 2022-08-20 DIAGNOSIS — E538 Deficiency of other specified B group vitamins: Secondary | ICD-10-CM | POA: Diagnosis not present

## 2022-08-20 LAB — T3: T3, Total: 90 ng/dL (ref 76–181)

## 2022-08-20 LAB — T PALLIDUM AB: T Pallidum Abs: POSITIVE — AB

## 2022-08-20 MED ORDER — LISINOPRIL 5 MG PO TABS: 5.0000 mg | ORAL_TABLET | Freq: Every day | ORAL | 4 refills | Status: DC

## 2022-08-20 MED ORDER — TRAZODONE HCL 50 MG PO TABS: 50.0000 mg | ORAL_TABLET | Freq: Two times a day (BID) | ORAL | 4 refills | Status: DC

## 2022-08-20 MED ORDER — AMLODIPINE BESYLATE 5 MG PO TABS: 5.0000 mg | ORAL_TABLET | Freq: Every day | ORAL | 4 refills | Status: DC

## 2022-08-20 NOTE — Progress Notes (Signed)
Ph: [phone]       Fax: [phone]   Patient ID: Justin Ali, male    DOB: 04-May-1940, 82 y.o.   MRN: [id number]  This visit was conducted in person.  BP 132/70   Pulse 66   Temp (!) 97.4 F (36.3 C) (Temporal)   Ht 5\' 5"  (1.651 m)   Wt 168 lb 2 oz (76.3 kg)   SpO2 97%   BMI 27.98 kg/m    CC: AMW f/u visit  Subjective:   HPI: Justin Ali is a 82 y.o. male presenting on 08/20/2022 for Annual Exam (MCR prt 2 [AWV- 07/16/22].)   Last seen 02/2022.  Saw health advisor 06/2022 for medicare wellness visit. Note reviewed. Failed cognitive assessment.      07/16/2022    2:52 PM  6CIT Screen  What Year? 0 points  What month? 3 points  What time? 0 points  Count back from 20 0 points  Months in reverse 4 points  Repeat phrase 2 points  Total Score 9 points   Today: MMSE: 22/30 (missed 2 orientation, 1 attention, 3 recall, 2 language) CDT: 1/4   No results found.  Flowsheet Row Clinical Support from 07/16/2022 in [facility] [place]  PHQ-2 Total Score 0          07/16/2022    2:42 PM 07/05/2021    2:07 PM 07/04/2020    2:02 PM 06/30/2019   12:09 PM 06/22/2018    8:19 AM  Fall Risk   Falls in the past year? 0 0 1 1 0  Comment    fell on ice   Number falls in past yr: 0 0 0 0   Injury with Fall? 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 Rpt positive RPR titer 1:1 07/2022 - followed by reactive FTA Ab positive No sex in over 10 yrs. Denies new sexual partners. Denies h/o primary syphilis infection symptoms.   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   "I lost my cancer doc" - bad experience with [facility].  [facility] [place]  He has no transportation. He received hydrocodone/apap through Dr Orlie Dakin #90 last filled 05/15/2022.    Chronic insomnia managed with restoril 30mg  daily + trazodone 50mg  nightly.   Antihypertensives and gabapentin through [place].   Currently living in [place].  No family nearby.  No friends.  His family is in [place].  61 yo aunt lives nearby.   Preventative: COLONOSCOPY Date: 03/2014 no residual polyp, cecal AVM, rpt 3-4 yrs [name]). Pt has decided to stop screening. No blood in stool or bowel changes. Metastatic prostate cancer to abd LN and bony disease followed by Dr Orlie Dakin Q3 mo on Xgeva and Zytiga/prednisone.  Lung cancer screening - not eligible.  Flu shot yearly - skipped this year COVID vaccine Pfizer 04/2019, 05/2019, booster 03/2020 Td - 2012  Pneumovax 2012, prevnar-13 2015 Shingrix - not interested Advanced directives - Received advanced directives, scanned 05/2015. DNR. No prolonged life support if terminal condition. Does not want feeding  tube. No HCPOA form - doesn't know who HCPOA would be. Wants to be [facility] in [facility].  No changing moles on skin.  Ex smoker - quit 2004 (cigars).  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 Interim medical history since our last visit reviewed. Allergies and medications reviewed and updated. Outpatient Medications  Prior to Visit  Medication Sig Dispense Refill   abiraterone acetate (ZYTIGA) 250 MG tablet Take 4 tablets (1,000 mg total) by mouth daily. Take on an empty stomach 1 hour before or 2 hours after a meal 120 tablet 5   CALCIUM PO Take 1,200 mg by mouth daily.     cholecalciferol (VITAMIN D) 1000 UNITS tablet Take 2,000 Units by mouth daily.     clotrimazole (LOTRIMIN) 1 % cream Apply 1 application topically 2 (two) times daily. For 2 weeks or until full resolution 30 g 0   gabapentin (NEURONTIN) 300 MG capsule Take 1 capsule (300 mg total) by mouth at bedtime. 90 capsule 3   HYDROcodone-acetaminophen (NORCO/VICODIN) 5-325 MG tablet Take 1 tablet by mouth 3 (three) times daily as needed for moderate pain. (Patient taking differently: Take 1 tablet by mouth 3 (three) times daily as needed for moderate pain. Takes 1 tablet twice a day as needed) 90 tablet 0   Multiple Vitamins-Minerals (CENTRUM SILVER 50+MEN) TABS Take by mouth daily.     predniSONE (DELTASONE) 5 MG tablet Take 1 tablet (5 mg total) by mouth daily with breakfast. 30 tablet 5   temazepam (RESTORIL) 30 MG capsule Take 1 capsule (30 mg total) by mouth at bedtime. 30 capsule 0   amLODipine (NORVASC) 5 MG tablet Take 1 tablet (5 mg total) by mouth daily. 90 tablet 3   lisinopril (PRINIVIL,ZESTRIL) 5 MG tablet Take 1 tablet (5 mg total) by mouth daily. 90 tablet 3   Omega-3 Fatty Acids (FISH OIL) 1000 MG CAPS Take 2 capsules (2,000 mg total) by mouth daily.     pantoprazole (PROTONIX) 40 MG tablet Take 1 tablet (40 mg total) by mouth every morning. 30 tablet 3   traMADol (ULTRAM) 50 MG tablet Take 1 tablet (50 mg total) by mouth 2 (two) times daily as needed. 30 tablet 0   traZODone (DESYREL) 50 MG tablet Take 1 tablet (50 mg total) by mouth 2 (two) times daily. 180 tablet 0   ondansetron (ZOFRAN) 4 MG tablet Take 1 tablet (4 mg total) by mouth daily as needed for nausea or vomiting. (Patient not taking: Reported on 07/16/2022) 30 tablet 1    No facility-administered medications prior to visit.     Per HPI unless specifically indicated in ROS section below Review of Systems  Objective:  BP 132/70   Pulse 66   Temp (!) 97.4 F (36.3 C) (Temporal)   Ht 5\' 5"  (1.651 m)   Wt 168 lb 2 oz (76.3 kg)   SpO2 97%   BMI 27.98 kg/m   Wt Readings from Last 3 Encounters:  08/20/22 168 lb 2 oz (76.3 kg)  07/16/22 180 lb (81.6 kg)  06/27/22 179 lb 12.8 oz (  81.6 kg)      Physical Exam Vitals and nursing note reviewed.  Constitutional:      General: He is not in acute distress.    Appearance: Normal appearance. He is well-developed. He is not ill-appearing.  HENT:     Head: Normocephalic and atraumatic.     Right Ear: Hearing, tympanic membrane, ear canal and external ear normal.     Left Ear: Hearing, tympanic membrane, ear canal and external ear normal.     Mouth/Throat:     Mouth: Mucous membranes are moist.     Pharynx: Oropharynx is clear. No oropharyngeal exudate or posterior oropharyngeal erythema.  Eyes:     General: No scleral icterus.    Extraocular Movements: Extraocular movements intact.     Conjunctiva/sclera: Conjunctivae normal.     Pupils: Pupils are equal, round, and reactive to light.  Neck:     Thyroid: No thyroid mass or thyromegaly.  Cardiovascular:     Rate and Rhythm: Normal rate and regular rhythm.     Pulses: Normal pulses.          Radial pulses are 2+ on the right side and 2+ on the left side.     Heart sounds: Normal heart sounds. No murmur heard. Pulmonary:     Effort: Pulmonary effort is normal. No respiratory distress.     Breath sounds: Normal breath sounds. No wheezing, rhonchi or rales.  Abdominal:     General: Bowel sounds are normal. There is no distension.     Palpations: Abdomen is soft. There is no mass.     Tenderness: There is no abdominal tenderness. There is no guarding or rebound.     Hernia: No hernia is present.  Musculoskeletal:        General: Normal range of motion.      Cervical back: Normal range of motion and neck supple.     Right lower leg: No edema.     Left lower leg: No edema.  Lymphadenopathy:     Cervical: No cervical adenopathy.  Skin:    General: Skin is warm and dry.     Findings: No rash.  Neurological:     General: No focal deficit present.     Mental Status: He is alert and oriented to person, place, and time.  Psychiatric:        Mood and Affect: Mood normal.        Behavior: Behavior normal.        Thought Content: Thought content normal.        Judgment: Judgment normal.       Results for orders placed or performed in visit on 08/15/22  T4, free  Result Value Ref Range   Free T4 1.03 0.60 - 1.60 ng/dL  T3  Result Value Ref Range   T3, Total 90 76 - 181 ng/dL  Renal function panel  Result Value Ref Range   Sodium 145 135 - 145 mEq/L   Potassium 3.9 3.5 - 5.1 mEq/L   Chloride 107 96 - 112 mEq/L   CO2 29 19 - 32 mEq/L   Albumin 4.2 3.5 - 5.2 g/dL   BUN 17 6 - 23 mg/dL   Creatinine, Ser 7.82 0.40 - 1.50 mg/dL   Glucose, Bld 88 70 - 99 mg/dL   Phosphorus 4.5 2.3 - 4.6 mg/dL   GFR 95.62 (L) >13.08 mL/min   Calcium 9.4 8.4 - 10.5 mg/dL  RPR  Result Value Ref Range   RPR Ser  Ql REACTIVE (A) NON-REACTIVE  TSH  Result Value Ref Range   TSH 1.53 0.35 - 5.50 uIU/mL  Vitamin B12  Result Value Ref Range   Vitamin B-12 856 211 - 911 pg/mL  CBC with Differential/Platelet  Result Value Ref Range   WBC 4.7 4.0 - 10.5 K/uL   RBC 4.05 (L) 4.22 - 5.81 Mil/uL   Hemoglobin 12.6 (L) 13.0 - 17.0 g/dL   HCT 40.9 (L) 81.1 - 91.4 %   MCV 91.8 78.0 - 100.0 fl   MCHC 33.8 30.0 - 36.0 g/dL   RDW 78.2 95.6 - 21.3 %   Platelets 196.0 150.0 - 400.0 K/uL   Neutrophils Relative % 55.1 43.0 - 77.0 %   Lymphocytes Relative 33.3 12.0 - 46.0 %   Monocytes Relative 8.5 3.0 - 12.0 %   Eosinophils Relative 2.5 0.0 - 5.0 %   Basophils Relative 0.6 0.0 - 3.0 %   Neutro Abs 2.6 1.4 - 7.7 K/uL   Lymphs Abs 1.6 0.7 - 4.0 K/uL   Monocytes  Absolute 0.4 0.1 - 1.0 K/uL   Eosinophils Absolute 0.1 0.0 - 0.7 K/uL   Basophils Absolute 0.0 0.0 - 0.1 K/uL  Microalbumin / creatinine urine ratio  Result Value Ref Range   Microalb, Ur 5.8 (H) 0.0 - 1.9 mg/dL   Creatinine,U 086.5 mg/dL   Microalb Creat Ratio 1.3 0.0 - 30.0 mg/g  VITAMIN D 25 Hydroxy (Vit-D Deficiency, Fractures)  Result Value Ref Range   VITD 83.03 30.00 - 100.00 ng/mL  Lipid panel  Result Value Ref Range   Cholesterol 140 0 - 200 mg/dL   Triglycerides 784.6 (H) 0.0 - 149.0 mg/dL   HDL 96.29 (L) >52.84 mg/dL   VLDL 13.2 0.0 - 44.0 mg/dL   LDL Cholesterol 75 0 - 99 mg/dL   Total CHOL/HDL Ratio 4    NonHDL 105.15   Rpr titer  Result Value Ref Range   RPR Titer 1:1 (H)   T PALLIDUM AB  Result Value Ref Range   T Pallidum Abs POSITIVE (A) NEGATIVE    Assessment & Plan:   Problem List Items Addressed This Visit     Dyslipidemia    Overall stable period off statin, continues fish oil daily.  The ASCVD Risk score (Arnett DK, et al., 2019) failed to calculate for the following reasons:   The 2019 ASCVD risk score is only valid for ages 47 to 95       HYPERTENSION, BENIGN ESSENTIAL    Chronic, stable on current regimen of lisinopril 5mg  daily and amlodipine 5mg  daily.       Relevant Medications   amLODipine (NORVASC) 5 MG tablet   lisinopril (ZESTRIL) 5 MG tablet   Overweight (BMI 25.0-29.9)    Weight loss noted      Primary malignant 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 [facility])    Diagnosed 2018, followed by [facility] oncology Orlie Dakin) until he was discharged from their practice after presenting agitated/upset after being unsatisfied with care provided.  Do recommend he establish with [facility].  He continues zytiga 1000mg  daily with prednisone 5mg  daily along with Xvega injection Q3 months.       Chronic insomnia    Chronic, continue temazepam with trazodone. Notes difficulty sleeping if he misses one or the other.       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       History of GI bleed    He stopped PPI - will recommend restart this.       Chronic kidney disease, stage 3a (HCC)    Chronic, deteriorated. Encouraged increased water intake and avoid nephrotoxic agents.       Memory deficit - Primary    Rpt MMSE today 22/30 (previously 19/30), failed CDT as well.  Noted ongoing memory difficulty with periods of agitation.  See below re: positive RPR. Await ID eval, pending this consider acetylcholinesterase inhibitor. Caution with aricept in h/o GI bleed late last year.       Relevant Orders   Ambulatory referral to Infectious Disease   Cognitive and behavioral changes   Relevant Orders   Ambulatory referral to Infectious Disease   Positive RPR test    Checked for worsening memory.  2 separate positive RPR tests (with low titers) along with more strong positive FTA Ab on last testing this week. Discussed with patient. Denies h/o syphilis, symptoms of primary syphilis infection, or risk factors for syphilis infection. ?true vs false reading.  With cognitive and behavioral changes, will need to r/o early neurosyphilis.  Will recommend urgent ID evaluation - all this was discussed with patient. Refer to [facility].       Relevant Orders   Ambulatory referral to Infectious Disease     Meds ordered this encounter  Medications   amLODipine (NORVASC) 5 MG tablet    Sig: Take 1 tablet (5 mg total) by mouth daily.    Dispense:  90 tablet    Refill:  4   lisinopril (ZESTRIL) 5 MG tablet    Sig: Take 1 tablet (5 mg total) by mouth daily.    Dispense:  90 tablet    Refill:  4    traZODone (DESYREL) 50 MG tablet    Sig: Take 1 tablet (50 mg total) by mouth 2 (two) times daily.    Dispense:  180 tablet    Refill:  4   Omega-3 Fatty Acids (FISH OIL) 1000 MG CAPS    Sig: Take 1 capsule (1,000 mg total) by mouth daily.    Orders Placed This Encounter  Procedures   Ambulatory referral to Infectious Disease    Referral Priority:   Urgent    Referral Type:   Consultation    Referral Reason:   Specialty Services Required    Requested Specialty:   Infectious Diseases    Number of Visits Requested:   1    Patient Instructions  Work on:  1. Nutritious well balance diet.  2. Regular physical activity routine.  3. Regular mental activity such as reading books, word puzzles, math puzzles, jigsaw puzzles.  4. Social engagement.  Also ensure good blood pressure control, limit alcohol, no smoking.    I do recommend establishing with [facility] for cancer treatment. Ask them about hydrocodone pain medicine and let me know what you find out.  Continue other medicines for now.  I'd like to refer you to infectious disease doctor for further evaluation of positive syphilis test.  Return in 3-4 months for follow up visit   Follow up plan: Return in about 4 months (around 12/20/2022), or if symptoms worsen or fail  to improve, for follow up visit.  Ria Bush, MD

## 2022-08-20 NOTE — Patient Instructions (Addendum)
Work on:  1. Nutritious well balance diet.  2. Regular physical activity routine.  3. Regular mental activity such as reading books, word puzzles, math puzzles, jigsaw puzzles.  4. Social engagement.  Also ensure good blood pressure control, limit alcohol, no smoking.    I do recommend establishing with [facility] for cancer treatment. Ask them about hydrocodone pain medicine and let me know what you find out.  Continue other medicines for now.  I'd like to refer you to infectious disease doctor for further evaluation of positive syphilis test.  Return in 3-4 months for follow up visit

## 2022-08-22 ENCOUNTER — Telehealth: Payer: Self-pay | Admitting: Family Medicine

## 2022-08-22 DIAGNOSIS — A530 Latent syphilis, unspecified as early or late: Secondary | ICD-10-CM | POA: Insufficient documentation

## 2022-08-22 MED ORDER — OMEPRAZOLE 20 MG PO CPDR: 20.0000 mg | DELAYED_RELEASE_CAPSULE | Freq: Every day | ORAL | 4 refills | Status: DC

## 2022-08-22 MED ORDER — FISH OIL 1000 MG PO CAPS: 1.0000 | ORAL_CAPSULE | Freq: Every day | ORAL | Status: DC

## 2022-08-22 NOTE — Addendum Note (Signed)
Addended by: Eustaquio Boyden on: 08/22/2022 02:06 PM   Modules accepted: Orders

## 2022-08-22 NOTE — Assessment & Plan Note (Addendum)
Chronic, deteriorated. Encouraged increased water intake and avoid nephrotoxic agents.

## 2022-08-22 NOTE — Assessment & Plan Note (Addendum)
Checked for worsening memory.  2 separate positive RPR tests (with low titers) along with more strong positive FTA Ab on last testing this week. Discussed with patient. Denies h/o syphilis, symptoms of primary syphilis infection, or risk factors for syphilis infection. ?true vs false reading.  With cognitive and behavioral changes, will need to r/o early neurosyphilis.  Will recommend urgent ID evaluation - all this was discussed with patient. Refer to [facility] [place] ID clinic.

## 2022-08-22 NOTE — Assessment & Plan Note (Signed)
Overall stable period off statin, continues fish oil daily.  The ASCVD Risk score (Arnett DK, et al., 2019) failed to calculate for the following reasons:   The 2019 ASCVD risk score is only valid for ages 34 to 23

## 2022-08-22 NOTE — Assessment & Plan Note (Signed)
He stopped PPI - will recommend restart this.

## 2022-08-22 NOTE — Telephone Encounter (Signed)
Attempted to contact pt.  No answer.  Vm box full.  Need to relay Dr. G's message.  

## 2022-08-22 NOTE — Assessment & Plan Note (Addendum)
Rpt MMSE today 22/30 (previously 19/30), failed CDT as well.  Noted ongoing memory difficulty with periods of agitation.  See below re: positive RPR. Await ID eval, pending this consider acetylcholinesterase inhibitor. Caution with aricept in h/o GI bleed late last year.

## 2022-08-22 NOTE — Assessment & Plan Note (Addendum)
Chronic, continue temazepam with trazodone. Notes difficulty sleeping if he misses one or the other.

## 2022-08-22 NOTE — Telephone Encounter (Signed)
Patient called in and stated that he would like to see another oncology doctor. He was wanting to know if Dr. Reece Agar could recommend him to another oncology doctor. Thank you!

## 2022-08-22 NOTE — Assessment & Plan Note (Addendum)
S/p multiple eyelid surgeries first at [facility], latest bilateral ectropion scar revision 05/2018 (Dr Dimas Millin @ [facility] in [place])

## 2022-08-22 NOTE — Assessment & Plan Note (Signed)
Chronic, stable on current regimen of lisinopril 5mg  daily and amlodipine 5mg  daily.

## 2022-08-22 NOTE — Assessment & Plan Note (Signed)
Levels stable on daily MVI  ?

## 2022-08-22 NOTE — Assessment & Plan Note (Signed)
Weight loss noted  

## 2022-08-22 NOTE — Assessment & Plan Note (Signed)
Diagnosed 2018, followed by [facility] oncology Orlie Dakin) until he was discharged from their practice after presenting agitated/upset after being unsatisfied with care provided.  Do recommend he establish with [facility].  He continues zytiga 1000mg  daily with prednisone 5mg  daily along with Xvega injection Q3 months.

## 2022-08-22 NOTE — Telephone Encounter (Signed)
When we discussed this at OV Wed he was working towards getting established with [facility]. I recommend he see Lenn Sink for prostate cancer treatment.   Also, we discussed he was off heartburn medicine pantoprazole 40mg .  Given his history of upper GI bleed late last year 12/2021, I do recommend he restart a daily acid reducer medicine - I've sent in omeprazole 20mg  daily to [place], have him price out and see if insurance will cover this, or to let us know if any cheaper to get through the [place] and I will send there.

## 2022-08-25 NOTE — Telephone Encounter (Signed)
Spoke with pt relaying Dr. Timoteo Expose message. Pt verbalizes understanding. [facility] and he'll ask about seeing a "cancer doctor" then.

## 2022-08-26 ENCOUNTER — Other Ambulatory Visit: Payer: Self-pay | Admitting: Family Medicine

## 2022-08-26 DIAGNOSIS — C7951 Secondary malignant neoplasm of bone: Secondary | ICD-10-CM

## 2022-08-26 NOTE — Telephone Encounter (Signed)
Prescription Request  08/26/2022  LOV: 08/20/2022  What is the name of the medication or equipment? HYDROcodone-acetaminophen (NORCO/VICODIN) 5-325 MG tablet   Have you contacted your pharmacy to request a refill? No   Which pharmacy would you like this sent to?  [facility] - [place], [place] - [address] [place] [address] Phone: [phone] Fax: [phone]  Patient notified that their request is being sent to the clinical staff for review and that they should receive a response within 2 business days.   Please advise at Mobile [phone] (mobile)   Pt stated he no longer see provider that proscribe RX

## 2022-08-26 NOTE — Telephone Encounter (Signed)
Last refill Dr. Orlie Dakin 05/15/22 #90 Last office visit 08/17/22

## 2022-08-28 ENCOUNTER — Encounter: Payer: Self-pay | Admitting: Oncology

## 2022-08-29 MED ORDER — HYDROCODONE-ACETAMINOPHEN 5-325 MG PO TABS
1.0000 | ORAL_TABLET | Freq: Two times a day (BID) | ORAL | 0 refills | Status: DC | PRN
Start: 2022-08-29 — End: 2022-09-18

## 2022-08-29 NOTE — Telephone Encounter (Signed)
ERx #30. I've sent in shorter supply to last until [facility] sees [facility]- when is this upcoming appt scheduled for?

## 2022-08-29 NOTE — Telephone Encounter (Addendum)
Spoke with pt relaying Dr. Timoteo Expose message. Pt expresses his thanks and states he thinks [facility] app[facility] next [facility] sometime. Fyi to Dr. Reece Agar.

## 2022-09-04 ENCOUNTER — Other Ambulatory Visit: Payer: Self-pay | Admitting: Family Medicine

## 2022-09-04 DIAGNOSIS — F5104 Psychophysiologic insomnia: Secondary | ICD-10-CM

## 2022-09-05 NOTE — Telephone Encounter (Signed)
Name of Medication: Temazepam Name of Pharmacy: [facility] Last Fill or Written Date and Quantity: 08/07/22, #30 Last Office Visit and Type: 08/20/22, CPE Next Office Visit and Type: none Last Controlled Substance Agreement Date: none Last UDS: none

## 2022-09-05 NOTE — Telephone Encounter (Signed)
ERx 

## 2022-09-08 ENCOUNTER — Telehealth: Payer: Self-pay

## 2022-09-08 NOTE — Telephone Encounter (Signed)
Jake Ripp a fellow at [facility] called stating he couldn't tell based on patient chart if patient received eligard. Leta Jungling is requesting a Callback [phone].

## 2022-09-16 ENCOUNTER — Encounter: Payer: Self-pay | Admitting: Infectious Diseases

## 2022-09-16 ENCOUNTER — Ambulatory Visit: Payer: Medicare Other | Attending: Infectious Diseases | Admitting: Infectious Diseases

## 2022-09-16 ENCOUNTER — Other Ambulatory Visit: Payer: Self-pay | Admitting: Family Medicine

## 2022-09-16 ENCOUNTER — Telehealth: Payer: Self-pay | Admitting: Family Medicine

## 2022-09-16 ENCOUNTER — Other Ambulatory Visit
Admission: RE | Admit: 2022-09-16 | Discharge: 2022-09-16 | Disposition: A | Discharge status: Home / Self Care | Payer: Medicare Other | Source: Ambulatory Visit | Attending: Infectious Diseases | Admitting: Infectious Diseases

## 2022-09-16 VITALS — BP 101/59 | HR 74 | Temp 97.8°F | Ht 66.0 in | Wt 176.0 lb

## 2022-09-16 DIAGNOSIS — E785 Hyperlipidemia, unspecified: Secondary | ICD-10-CM | POA: Diagnosis not present

## 2022-09-16 DIAGNOSIS — I2510 Atherosclerotic heart disease of native coronary artery without angina pectoris: Secondary | ICD-10-CM | POA: Diagnosis not present

## 2022-09-16 DIAGNOSIS — R413 Other amnesia: Secondary | ICD-10-CM

## 2022-09-16 DIAGNOSIS — G2581 Restless legs syndrome: Secondary | ICD-10-CM | POA: Diagnosis not present

## 2022-09-16 DIAGNOSIS — Z79899 Other long term (current) drug therapy: Secondary | ICD-10-CM | POA: Insufficient documentation

## 2022-09-16 DIAGNOSIS — C7951 Secondary malignant neoplasm of bone: Secondary | ICD-10-CM | POA: Diagnosis not present

## 2022-09-16 DIAGNOSIS — A530 Latent syphilis, unspecified as early or late: Secondary | ICD-10-CM | POA: Diagnosis present

## 2022-09-16 DIAGNOSIS — Z85828 Personal history of other malignant neoplasm of skin: Secondary | ICD-10-CM | POA: Diagnosis not present

## 2022-09-16 DIAGNOSIS — Z7952 Long term (current) use of systemic steroids: Secondary | ICD-10-CM | POA: Diagnosis not present

## 2022-09-16 DIAGNOSIS — K760 Fatty (change of) liver, not elsewhere classified: Secondary | ICD-10-CM | POA: Insufficient documentation

## 2022-09-16 DIAGNOSIS — G4733 Obstructive sleep apnea (adult) (pediatric): Secondary | ICD-10-CM | POA: Diagnosis not present

## 2022-09-16 DIAGNOSIS — Z86010 Personal history of colonic polyps: Secondary | ICD-10-CM | POA: Diagnosis not present

## 2022-09-16 DIAGNOSIS — Z8619 Personal history of other infectious and parasitic diseases: Secondary | ICD-10-CM

## 2022-09-16 DIAGNOSIS — Z113 Encounter for screening for infections with a predominantly sexual mode of transmission: Secondary | ICD-10-CM | POA: Insufficient documentation

## 2022-09-16 DIAGNOSIS — C61 Malignant neoplasm of prostate: Secondary | ICD-10-CM | POA: Diagnosis not present

## 2022-09-16 DIAGNOSIS — Z206 Contact with and (suspected) exposure to human immunodeficiency virus [HIV]: Secondary | ICD-10-CM | POA: Diagnosis present

## 2022-09-16 DIAGNOSIS — I10 Essential (primary) hypertension: Secondary | ICD-10-CM | POA: Insufficient documentation

## 2022-09-16 DIAGNOSIS — Z0000 Encounter for general adult medical examination without abnormal findings: Secondary | ICD-10-CM | POA: Diagnosis not present

## 2022-09-16 DIAGNOSIS — F17290 Nicotine dependence, other tobacco product, uncomplicated: Secondary | ICD-10-CM | POA: Diagnosis not present

## 2022-09-16 DIAGNOSIS — R4189 Other symptoms and signs involving cognitive functions and awareness: Secondary | ICD-10-CM

## 2022-09-16 DIAGNOSIS — F5104 Psychophysiologic insomnia: Secondary | ICD-10-CM

## 2022-09-16 DIAGNOSIS — A539 Syphilis, unspecified: Secondary | ICD-10-CM | POA: Insufficient documentation

## 2022-09-16 NOTE — Telephone Encounter (Signed)
Spoke with patient.  He initially declined neurology referral but then agrees to see neurologist. Referral placed.

## 2022-09-16 NOTE — Progress Notes (Signed)
NAME: RONTEZ PHILLIPPI  DOB: 1940/10/25  MRN: [id number]  Date/Time: 09/16/2022 10:51 AM  Subjective:  Referred by Dr.Gutierrez for positive RPR ? Justin Ali is a 82 y.o. male with a history of Metastatic ca prostate with bone mets was intially followed by Dr.Finnegan but now is transferring to [facility] oncology due to behavioral outburst is referred to me for positive RPR and to r/o neurosyphilis Pt was tested for syphilis in Nov and then in April because of early memory loss and behavioral changes- HE had RPR of 1:1 and TPA positive Pt says he was treated for syphilis when he was young in his late teens/20s with one shot of an antibiotic . He has not been sexually active in 20 years He has no sores or penile discharge now HE does not complain of any weakness, He does say he is 82yrs old and that could explain some memory issues- he is still driving He has had multiple surgeries to his eyes significant blepharoptosis b/l upper lids and last was in June 2019 when he had Blepharoptosis repair with levator aponeurosis advancement bilateral upper Eyelids by Dr.Fowler   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   2 mm - non-bleeding 03/18/2013 colonoscopy    CAD (coronary artery disease), native coronary artery 2014   By CT lung (04/2013)    Cataract    Gastric ulcer    HLD (hyperlipidemia)    HTN (hypertension)    Hyperglycemia    NAFLD (nonalcoholic fatty liver disease) 40/9811   by abd [place] with increased LFTs   OSA (obstructive sleep apnea)    did not tolerate CPAP   Other benign neoplasm of connective and other soft tissue of unspecified site    Neurofibroma of lateral periorbital area   Peripheral neuropathy    Personal history of colonic adenomas 08/05/2012   Pneumonia 01/2013   CAP LUL   Restless legs syndrome (RLS)    Sleep apnea    no cpap    Past Surgical History:  Procedure Laterality Date   COLONOSCOPY  07/2012   5 adenomas  (tubular, TV, serrated), rec rpt 5 months [name])   COLONOSCOPY  02/2013   residual adenomas, cecal AVM, rec rpt 1 yr [name])   COLONOSCOPY  03/2014   no residual polyp, cecal AVM, rpt 3-4 yrs [name])   ECTROPION REPAIR Bilateral 09/29/2017   Procedure: REPAIR OF ECTROPION, EXTENSIVE UPPER AND LOWER;  Surgeon: Imagene Riches, MD;  Location: [facility];  Service: Ophthalmology;  Laterality: Bilateral;   ESOPHAGOGASTRODUODENOSCOPY (EGD) WITH PROPOFOL N/A 01/02/2022   normal esophagus, erosive gastropathy without recent bleed, nonbleeding gastric ulcers and duodenal ulcer with flat pigmented spotWohl, [name], MD)   ORIF ANKLE FRACTURE  11/29/2000   Dr. Katrinka Blazing   PTOSIS REPAIR Bilateral 09/29/2017   Procedure: BLEPHAROPTOSIS REPAIR RESECT EX UPPER;  Surgeon: Imagene Riches, MD;  Location: [facility];  Service: Ophthalmology;  Laterality: Bilateral;   RECONSTRUCTION OF EYELID Bilateral 05/31/2018   SKIN CANCER EXCISION  1997   left anterior neck    Social History   Socioeconomic History   Marital status: Legally Separated    Spouse name: Not on file   Number of children: 2   Years of education: Not on file   Highest education level: Not on file  Occupational History   Occupation: Truck driver    Comment: [employer]  Tobacco Use   Smoking status: Former    Types: Cigarettes,  Cigars    Quit date: 04/28/2002    Years since quitting: 20.4   Smokeless tobacco: Never   Tobacco comments:    rare cigar  Vaping Use   Vaping Use: Never used  Substance and Sexual Activity   Alcohol use: Not Currently   Drug use: No   Sexual activity: Not on file  Other Topics Concern   Not on file  Social History Narrative   Caffeine: occasional coffee   Lives with wife, 1 cat   Some care through the [place] - served 8 years in the [employer]   Occupation: truck driver Ambulance person)   Activity: goes to [employer] 3x/wk   Diet: rare water, lots of sweet tea, some fruits/vegetables    Social Determinants of Health   Financial Resource Strain: Low Risk  (07/16/2022)   Overall Financial Resource Strain (CARDIA)    Difficulty of Paying Living Expenses: Not hard at all  Food Insecurity: No Food Insecurity (07/16/2022)   Hunger Vital Sign    Worried About Running Out of Food in the Last Year: Never true    Ran Out of Food in the Last Year: Never true  Transportation Needs: No Transportation Needs (07/16/2022)   PRAPARE - Administrator, Civil Service (Medical): No    Lack of Transportation (Non-Medical): No  Physical Activity: Insufficiently Active (07/16/2022)   Exercise Vital Sign    Days of Exercise per Week: 2 days    Minutes of Exercise per Session: 30 min  Stress: Stress Concern Present (07/16/2022)   [employer] of Occupational Health - Occupational Stress Questionnaire    Feeling of Stress : To some extent  Social Connections: Socially Isolated (07/16/2022)   Social Connection and Isolation Panel [NHANES]    Frequency of Communication with Friends and Family: More than three times a week    Frequency of Social Gatherings with Friends and Family: More than three times a week    Attends Religious Services: Never    Database administrator or Organizations: No    Attends Banker Meetings: Never    Marital Status: Separated  Intimate Partner Violence: Not At Risk (07/16/2022)   Humiliation, Afraid, Rape, and Kick questionnaire    Fear of Current or Ex-Partner: No    Emotionally Abused: No    Physically Abused: No    Sexually Abused: No    Family History  Problem Relation Age of Onset   Cancer Father        liver   Alcohol abuse Father    Liver cancer Father    Cancer Mother        breast   Breast cancer Mother    Hypertension Brother        Half-brother   Coronary artery disease Paternal Aunt        CABG   Cancer Other        GM; Aunt - breast   Colon cancer Neg Hx    Rectal cancer Neg Hx    Stomach cancer Neg Hx     Esophageal cancer Neg Hx    No Known Allergies I? Current Outpatient Medications  Medication Sig Dispense Refill   abiraterone acetate (ZYTIGA) 250 MG tablet Take 4 tablets (1,000 mg total) by mouth daily. Take on an empty stomach 1 hour before or 2 hours after a meal 120 tablet 5   amLODipine (NORVASC) 5 MG tablet Take 1 tablet (5 mg total) by mouth daily. 90 tablet 4  CALCIUM PO Take 1,200 mg by mouth daily.     cholecalciferol (VITAMIN D) 1000 UNITS tablet Take 2,000 Units by mouth daily.     clotrimazole (LOTRIMIN) 1 % cream Apply 1 application topically 2 (two) times daily. For 2 weeks or until full resolution 30 g 0   gabapentin (NEURONTIN) 300 MG capsule Take 1 capsule (300 mg total) by mouth at bedtime. 90 capsule 3   HYDROcodone-acetaminophen (NORCO/VICODIN) 5-325 MG tablet Take 1 tablet by mouth 2 (two) times daily as needed for moderate pain. 30 tablet 0   lisinopril (ZESTRIL) 5 MG tablet Take 1 tablet (5 mg total) by mouth daily. 90 tablet 4   Multiple Vitamins-Minerals (CENTRUM SILVER 50+MEN) TABS Take by mouth daily.     Omega-3 Fatty Acids (FISH OIL) 1000 MG CAPS Take 1 capsule (1,000 mg total) by mouth daily.     omeprazole (PRILOSEC) 20 MG capsule Take 1 capsule (20 mg total) by mouth daily. 90 capsule 4   predniSONE (DELTASONE) 5 MG tablet Take 1 tablet (5 mg total) by mouth daily with breakfast. 30 tablet 5   temazepam (RESTORIL) 30 MG capsule Take 1 capsule (30 mg total) by mouth at bedtime. 30 capsule 0   traZODone (DESYREL) 50 MG tablet Take 1 tablet (50 mg total) by mouth 2 (two) times daily. 180 tablet 4   No current facility-administered medications for this visit.     Abtx:  Anti-infectives (From admission, onward)    None       REVIEW OF SYSTEMS:  Const: negative fever, negative chills, negative weight loss Eyes: negative diplopia or visual changes, negative eye pain ENT: negative coryza, negative sore throat Resp: negative cough, hemoptysis,  dyspnea Cards: negative for chest pain, palpitations, lower extremity edema GU: negative for frequency, dysuria and hematuria GI: Negative for abdominal pain, diarrhea, bleeding, constipation Skin: negative for rash and pruritus Heme: negative for easy bruising and gum/nose bleeding MS: negative for myalgias, arthralgias, back pain and muscle weakness Neurolo:negative for headaches, dizziness, vertigo, memory problems  Psych:? Emotional outburst Was asked to leave his home by the cops at the complaint of his wife He is in cider apts Endocrine: negative for thyroid, diabetes Allergy/Immunology- negative for any medication or food allergies ?  Objective:  VITALS:  BP (!) 101/59   Pulse 74   Temp 97.8 F (36.6 C) (Temporal)   Ht 5\' 6"  (1.676 m)   Wt 176 lb (79.8 kg)   SpO2 95%   BMI 28.41 kg/m   PHYSICAL EXAM:  General: Alert, cooperative, no distress, appears stated age.  Head: Normocephalic, without obvious abnormality, atraumatic. Eyes: Conjunctivae clear, anicteric sclerae. Eye lid surgery b/l with scar tissue and bunching on the sides ENT Nares normal. No drainage or sinus tenderness. Lips, mucosa, and tongue normal. No Thrush Neck: Supple, symmetrical, no adenopathy, thyroid: non tender no carotid bruit and no JVD. Back: No CVA tenderness. Lungs: Clear to auscultation bilaterally. No Wheezing or Rhonchi. No rales. Heart: Regular rate and rhythm, no murmur, rub or gallop. Abdomen: Soft, non-tender,not distended. Bowel sounds normal. No masses Extremities: atraumatic, no cyanosis. No edema. No clubbing Skin: No rashes or lesions. Or bruising Lymph: Cervical, supraclavicular normal. Neurologic: Grossly non-focal Pertinent Labs Lab Results CBC    Component Value Date/Time   WBC 4.7 08/15/2022 0749   RBC 4.05 (L) 08/15/2022 0749   HGB 12.6 (L) 08/15/2022 0749   HGB 12.3 (L) 02/16/2013 0523   HCT 37.2 (L) 08/15/2022 0749   HCT 35.6 (  L) 02/16/2013 0523   PLT 196.0  08/15/2022 0749   PLT 166 02/16/2013 0523   MCV 91.8 08/15/2022 0749   MCV 90 02/16/2013 0523   MCH 30.5 06/27/2022 0950   MCHC 33.8 08/15/2022 0749   RDW 14.7 08/15/2022 0749   RDW 13.3 02/16/2013 0523   LYMPHSABS 1.6 08/15/2022 0749   LYMPHSABS 1.1 02/16/2013 0523   MONOABS 0.4 08/15/2022 0749   MONOABS 0.8 02/16/2013 0523   EOSABS 0.1 08/15/2022 0749   EOSABS 0.1 02/16/2013 0523   BASOSABS 0.0 08/15/2022 0749   BASOSABS 0.0 02/16/2013 0523       Latest Ref Rng & Units 08/15/2022    7:49 AM 06/27/2022    9:50 AM 06/17/2022    6:53 PM  CMP  Glucose 70 - 99 mg/dL 88  96  161   BUN 6 - 23 mg/dL 17  11  12    Creatinine 0.40 - 1.50 mg/dL 0.96  0.45  4.09   Sodium 135 - 145 mEq/L 145  140  145   Potassium 3.5 - 5.1 mEq/L 3.9  3.7  3.4   Chloride 96 - 112 mEq/L 107  107  107   CO2 19 - 32 mEq/L 29  24    Calcium 8.4 - 10.5 mg/dL 9.4  9.0    Total Protein 6.5 - 8.1 g/dL  6.6    Total Bilirubin 0.3 - 1.2 mg/dL  0.6    Alkaline Phos 38 - 126 U/L  59    AST 15 - 41 U/L  18    ALT 0 - 44 U/L  11     08/15/22 RPR 1:1 TPA positive   ? Impression/Recommendation ?RPR 1:1 with TPA posiitve - was tested because of behavioral changes and memory issues- PCP wanted to rule out neurosyphilis Pt says he was treated for syphilis with one shot as a young man I cannot verify it - but if so then he is serofast- will check HIV  He does not have any neurological deficit on my gross examination Pt does not want LP I spoke to his PCP and asked him to refer to neuro to assess for his memory and behavior If another provider is independently able to confirm his treatment as a young man without leading questions then we can avoid rx IF neurology after examination thinks he may have NS then we can treat  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 ? ___________________________________________________ Discussed with patient, requesting provider in detail Note:  This document was prepared using Dragon voice recognition software and may include unintentional dictation errors.

## 2022-09-16 NOTE — Telephone Encounter (Signed)
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 If that's the case, likely serofast.   Will refer to neurology for memory evaluation given abnormal MMSE last visit and concern for behavioral changes. I called pt to discuss referral, unable to reach - will try again later. Will refer to Dr Sherryll Burger at [facility].

## 2022-09-17 ENCOUNTER — Telehealth: Payer: Self-pay | Admitting: *Deleted

## 2022-09-17 LAB — HIV ANTIBODY (ROUTINE TESTING W REFLEX): HIV Screen 4th Generation wRfx: NONREACTIVE

## 2022-09-17 NOTE — Telephone Encounter (Signed)
Duplicate temazepam request- sent in 09/05/22, #30/0.  Name of Medication: Hydrocodone-APAP Name of Pharmacy: [facility] Last Fill or Written Date and Quantity: 08/29/22, #30 Last Office Visit and Type: 08/20/22, CPE Next Office Visit and Type: none Last Controlled Substance Agreement Date: none Last UDS: none

## 2022-09-17 NOTE — Telephone Encounter (Signed)
Nurse returned patient call regarding if he still had an appointment with Dr Orlie Dakin in June. Nurse communicated with MD who stated that patient had aggressive behavior on last visit and patient was informed that he had been discharged from the clinic and Dr Leta Jungling Ripp at [facility] was now following patient for his oncology needs.  Nurse instructed patient of the above and stated patient should call Dr Ripp for a follow up appointment.  Pt verbalized understanding.

## 2022-09-18 NOTE — Telephone Encounter (Signed)
ERx hydrocodone. Temazepam refill too early (done 5/10)

## 2022-09-19 ENCOUNTER — Encounter: Payer: Self-pay | Admitting: *Deleted

## 2022-09-30 ENCOUNTER — Ambulatory Visit: Payer: No Typology Code available for payment source | Admitting: Oncology

## 2022-09-30 ENCOUNTER — Other Ambulatory Visit: Payer: No Typology Code available for payment source

## 2022-09-30 ENCOUNTER — Ambulatory Visit: Payer: No Typology Code available for payment source

## 2022-10-07 ENCOUNTER — Other Ambulatory Visit: Payer: Self-pay | Admitting: Family Medicine

## 2022-10-07 DIAGNOSIS — F5104 Psychophysiologic insomnia: Secondary | ICD-10-CM

## 2022-10-07 NOTE — Telephone Encounter (Addendum)
Name of Medication: Temazepam Name of Pharmacy: [facility] Last Fill or Written Date and Quantity: 09/05/22, #30 Last Office Visit and Type: 08/20/22, CPE Next Office Visit and Type: 10/10/22, leg swelling Last Controlled Substance Agreement Date: none Last UDS: none

## 2022-10-08 NOTE — Telephone Encounter (Signed)
ERx 

## 2022-10-10 ENCOUNTER — Ambulatory Visit (INDEPENDENT_AMBULATORY_CARE_PROVIDER_SITE_OTHER): Payer: Medicare Other | Admitting: Family Medicine

## 2022-10-10 ENCOUNTER — Encounter: Payer: Self-pay | Admitting: Family Medicine

## 2022-10-10 VITALS — BP 134/66 | HR 77 | Temp 97.6°F | Ht 66.0 in | Wt 177.4 lb

## 2022-10-10 DIAGNOSIS — C61 Malignant neoplasm of prostate: Secondary | ICD-10-CM | POA: Diagnosis not present

## 2022-10-10 DIAGNOSIS — R1903 Right lower quadrant abdominal swelling, mass and lump: Secondary | ICD-10-CM | POA: Diagnosis not present

## 2022-10-10 DIAGNOSIS — K760 Fatty (change of) liver, not elsewhere classified: Secondary | ICD-10-CM | POA: Diagnosis not present

## 2022-10-10 DIAGNOSIS — R4689 Other symptoms and signs involving appearance and behavior: Secondary | ICD-10-CM

## 2022-10-10 DIAGNOSIS — K5900 Constipation, unspecified: Secondary | ICD-10-CM

## 2022-10-10 DIAGNOSIS — R4189 Other symptoms and signs involving cognitive functions and awareness: Secondary | ICD-10-CM

## 2022-10-10 DIAGNOSIS — Z8719 Personal history of other diseases of the digestive system: Secondary | ICD-10-CM | POA: Diagnosis not present

## 2022-10-10 DIAGNOSIS — R600 Localized edema: Secondary | ICD-10-CM | POA: Diagnosis not present

## 2022-10-10 DIAGNOSIS — C7951 Secondary malignant neoplasm of bone: Secondary | ICD-10-CM | POA: Diagnosis not present

## 2022-10-10 DIAGNOSIS — R109 Unspecified abdominal pain: Secondary | ICD-10-CM

## 2022-10-10 DIAGNOSIS — A530 Latent syphilis, unspecified as early or late: Secondary | ICD-10-CM

## 2022-10-10 LAB — COMPREHENSIVE METABOLIC PANEL
ALT: 8 U/L (ref 0–53)
AST: 13 U/L (ref 0–37)
Albumin: 4.2 g/dL (ref 3.5–5.2)
Alkaline Phosphatase: 51 U/L (ref 39–117)
BUN: 17 mg/dL (ref 6–23)
CO2: 29 mEq/L (ref 19–32)
Calcium: 9.2 mg/dL (ref 8.4–10.5)
Chloride: 104 mEq/L (ref 96–112)
Creatinine, Ser: 1.1 mg/dL (ref 0.40–1.50)
GFR: 63.23 mL/min (ref >60.00)
Glucose, Bld: 111 mg/dL — ABNORMAL HIGH (ref 70–99)
Potassium: 4.4 mEq/L (ref 3.5–5.1)
Sodium: 141 mEq/L (ref 135–145)
Total Bilirubin: 0.5 mg/dL (ref 0.2–1.2)
Total Protein: 6.4 g/dL (ref 6.0–8.3)

## 2022-10-10 LAB — CBC WITH DIFFERENTIAL/PLATELET
Basophils Absolute: 0 10*3/uL (ref 0.0–0.1)
Basophils Relative: 0.5 % (ref 0.0–3.0)
Eosinophils Absolute: 0 10*3/uL (ref 0.0–0.7)
Eosinophils Relative: 0.4 % (ref 0.0–5.0)
HCT: 37 % — ABNORMAL LOW (ref 39.0–52.0)
Hemoglobin: 12.3 g/dL — ABNORMAL LOW (ref 13.0–17.0)
Lymphocytes Relative: 14.6 % (ref 12.0–46.0)
Lymphs Abs: 0.9 10*3/uL (ref 0.7–4.0)
MCHC: 33.1 g/dL (ref 30.0–36.0)
MCV: 92 fl (ref 78.0–100.0)
Monocytes Absolute: 0.3 10*3/uL (ref 0.1–1.0)
Monocytes Relative: 4.5 % (ref 3.0–12.0)
Neutro Abs: 4.9 10*3/uL (ref 1.4–7.7)
Neutrophils Relative %: 80 % — ABNORMAL HIGH (ref 43.0–77.0)
Platelets: 215 10*3/uL (ref 150.0–400.0)
RBC: 4.02 Mil/uL — ABNORMAL LOW (ref 4.22–5.81)
RDW: 14.5 % (ref 11.5–15.5)
WBC: 6.2 10*3/uL (ref 4.0–10.5)

## 2022-10-10 LAB — POC URINALSYSI DIPSTICK (AUTOMATED)
Blood, UA: NEGATIVE
Glucose, UA: NEGATIVE
Leukocytes, UA: NEGATIVE
Nitrite, UA: NEGATIVE
Protein, UA: POSITIVE — AB
Spec Grav, UA: 1.02 (ref 1.010–1.025)
Urobilinogen, UA: 0.2 E.U./dL
pH, UA: 6 (ref 5.0–8.0)

## 2022-10-10 LAB — TSH: TSH: 0.95 u[IU]/mL (ref 0.35–5.50)

## 2022-10-10 LAB — BRAIN NATRIURETIC PEPTIDE: Pro B Natriuretic peptide (BNP): 86 pg/mL (ref 0.0–100.0)

## 2022-10-10 MED ORDER — HYDROCODONE-ACETAMINOPHEN 5-325 MG PO TABS
1.0000 | ORAL_TABLET | Freq: Two times a day (BID) | ORAL | 0 refills | Status: DC | PRN
Start: 2022-10-10 — End: 2022-11-04

## 2022-10-10 MED ORDER — POLYETHYLENE GLYCOL 3350 17 GM/SCOOP PO POWD: 8.5000 g | Freq: Every day | ORAL | 0 refills | Status: DC | PRN

## 2022-10-10 NOTE — Progress Notes (Unsigned)
Ph: [phone] Fax: [phone]   Patient ID: Justin Ali, male    DOB: Jun 19, 1940, 82 y.o.   MRN: [id number]  This visit was conducted in person.  BP 134/66   Pulse 77   Temp 97.6 F (36.4 C) (Temporal)   Ht 5\' 6"  (1.676 m)   Wt 177 lb 6 oz (80.5 kg)   SpO2 96%   BMI 28.63 kg/m    CC: leg swelling, flank pain both bilateral Subjective:   HPI: Justin Ali is a 82 y.o. male presenting on 10/10/2022 for Leg Swelling (C/o B leg swelling. Started about 1 wk ago. Denies pain.) and Flank Pain (C/o B flank pain. Started mos ago. )   1 wk h/o bilateral leg swelling as well as bilateral flank pain for 3-4 wks. Notes increased frequency. Notes constipation for the past 2 weeks - managing with ExLax.   No dysuria, urgency, blood in urine. No blood in stool. No nausea/vomiting.   New firm knot to R abdomen. [facility]   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 pallidum antibody positive as well.  Evaluated by ID Dr. Rivka Safer who recommended neurology evaluation for second opinion about possible neurosyphilis. Hasn't scheduled neurology appointment.  He would be hesitant to pursue lumbar puncture. He doesn't remember if he was tested or treated for syphilis previously.  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 4 different wives.      Relevant past medical, surgical, family and social history reviewed and updated as indicated. Interim medical history since our last visit reviewed. Allergies and medications reviewed and updated. Outpatient Medications Prior to Visit  Medication Sig Dispense Refill   abiraterone acetate (ZYTIGA) 250 MG tablet Take 4 tablets (1,000 mg total) by mouth daily. Take on an empty stomach 1 hour before or 2 hours after a meal 120 tablet 5   amLODipine (NORVASC) 5 MG tablet Take 1  tablet (5 mg total) by mouth daily. 90 tablet 4   CALCIUM PO Take 1,200 mg by mouth daily.     cholecalciferol (VITAMIN D) 1000 UNITS tablet Take 2,000 Units by mouth daily.     clotrimazole (LOTRIMIN) 1 % cream Apply 1 application topically 2 (two) times daily. For 2 weeks or until full resolution 30 g 0   lisinopril (ZESTRIL) 5 MG tablet Take 1 tablet (5 mg total) by mouth daily. 90 tablet 4   Omega-3 Fatty Acids (FISH OIL) 1000 MG CAPS Take 1 capsule (1,000 mg total) by mouth daily.     omeprazole (PRILOSEC) 20 MG capsule Take 1 capsule (20 mg total) by mouth daily. 90 capsule 4   predniSONE (DELTASONE) 5 MG tablet Take 1 tablet (5 mg total) by mouth daily with breakfast. 30 tablet 5   temazepam (RESTORIL) 30 MG capsule Take 1 capsule (30 mg total) by mouth at bedtime. 30 capsule 0   traZODone (DESYREL) 50 MG tablet Take 1 tablet (50 mg total) by mouth 2 (two) times daily. 180 tablet 4   gabapentin (NEURONTIN) 300 MG capsule Take 1 capsule (300 mg total) by mouth at bedtime. 90 capsule 3   HYDROcodone-acetaminophen (NORCO/VICODIN) 5-325 MG tablet Take 1 tablet by mouth 2 (two) times daily as needed for moderate pain. 30 tablet 0   Multiple Vitamins-Minerals (CENTRUM SILVER 50+MEN) TABS Take by mouth daily.     pantoprazole (PROTONIX) 40 MG tablet  Take by mouth.     traMADol (ULTRAM) 50 MG tablet Take 50 mg by mouth 2 (two) times daily as needed.     No facility-administered medications prior to visit.     Per HPI unless specifically indicated in ROS section below Review of Systems  Objective:  BP 134/66   Pulse 77   Temp 97.6 F (36.4 C) (Temporal)   Ht 5\' 6"  (1.676 m)   Wt 177 lb 6 oz (80.5 kg)   SpO2 96%   BMI 28.63 kg/m   Wt Readings from Last 3 Encounters:  10/10/22 177 lb 6 oz (80.5 kg)  09/16/22 176 lb (79.8 kg)  08/20/22 168 lb 2 oz (76.3 kg)      Physical Exam Vitals and nursing note reviewed.  Constitutional:      Appearance: Normal appearance. He is not  ill-appearing.  HENT:     Head: Normocephalic and atraumatic.     Mouth/Throat:     Pharynx: Oropharynx is clear. No oropharyngeal exudate or posterior oropharyngeal erythema.     Comments: Mildly dry Eyes:     Extraocular Movements: Extraocular movements intact.     Pupils: Pupils are equal, round, and reactive to light.  Cardiovascular:     Rate and Rhythm: Normal rate and regular rhythm.     Pulses: Normal pulses.     Heart sounds: Normal heart sounds. No murmur heard. Pulmonary:     Effort: Pulmonary effort is normal. No respiratory distress.     Breath sounds: Normal breath sounds. No wheezing, rhonchi or rales.  Abdominal:     General: Bowel sounds are normal. There is no distension.     Palpations: Abdomen is soft. There is mass (firm nontender knot to right abdomen).     Tenderness: There is no abdominal tenderness. There is no guarding or rebound. Negative signs include Murphy's sign.     Hernia: No hernia is present.    Musculoskeletal:     Right lower leg: Edema (1+) present.     Left lower leg: Edema (1+) present.  Skin:    General: Skin is warm and dry.     Findings: No rash.  Neurological:     Mental Status: He is alert.  Psychiatric:        Mood and Affect: Mood normal.        Behavior: Behavior normal.       Results for orders placed or performed in visit on 10/10/22  Comprehensive metabolic panel  Result Value Ref Range   Sodium 141 135 - 145 mEq/L   Potassium 4.4 3.5 - 5.1 mEq/L   Chloride 104 96 - 112 mEq/L   CO2 29 19 - 32 mEq/L   Glucose, Bld 111 (H) 70 - 99 mg/dL   BUN 17 6 - 23 mg/dL   Creatinine, Ser 6.04 0.40 - 1.50 mg/dL   Total Bilirubin 0.5 0.2 - 1.2 mg/dL   Alkaline Phosphatase 51 39 - 117 U/L   AST 13 0 - 37 U/L   ALT 8 0 - 53 U/L   Total Protein 6.4 6.0 - 8.3 g/dL   Albumin 4.2 3.5 - 5.2 g/dL   GFR 54.09 >81.19 mL/min   Calcium 9.2 8.4 - 10.5 mg/dL  TSH  Result Value Ref Range   TSH 0.95 0.35 - 5.50 uIU/mL  CBC with  Differential/Platelet  Result Value Ref Range   WBC 6.2 4.0 - 10.5 K/uL   RBC 4.02 (L) 4.22 - 5.81 Mil/uL  Hemoglobin 12.3 (L) 13.0 - 17.0 g/dL   HCT 91.4 (L) 78.2 - 95.6 %   MCV 92.0 78.0 - 100.0 fl   MCHC 33.1 30.0 - 36.0 g/dL   RDW 21.3 08.6 - 57.8 %   Platelets 215.0 150.0 - 400.0 K/uL   Neutrophils Relative % 80.0 (H) 43.0 - 77.0 %   Lymphocytes Relative 14.6 12.0 - 46.0 %   Monocytes Relative 4.5 3.0 - 12.0 %   Eosinophils Relative 0.4 0.0 - 5.0 %   Basophils Relative 0.5 0.0 - 3.0 %   Neutro Abs 4.9 1.4 - 7.7 K/uL   Lymphs Abs 0.9 0.7 - 4.0 K/uL   Monocytes Absolute 0.3 0.1 - 1.0 K/uL   Eosinophils Absolute 0.0 0.0 - 0.7 K/uL   Basophils Absolute 0.0 0.0 - 0.1 K/uL  Brain natriuretic peptide  Result Value Ref Range   Pro B Natriuretic peptide (BNP) 86.0 0.0 - 100.0 pg/mL  POCT Urinalysis Dipstick (Automated)  Result Value Ref Range   Color, UA dark yellow    Clarity, UA clear    Glucose, UA Negative Negative   Bilirubin, UA 1+    Ketones, UA +/-    Spec Grav, UA 1.020 1.010 - 1.025   Blood, UA negative    pH, UA 6.0 5.0 - 8.0   Protein, UA Positive (A) Negative   Urobilinogen, UA 0.2 0.2 or 1.0 E.U./dL   Nitrite, UA negative    Leukocytes, UA Negative Negative   Assessment & Plan:   Problem List Items Addressed This Visit     Fatty liver    H/o this. Update LFTs and abdominal imaging.       Primary malignant 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 [facility])    Known h/o this.  Was followed by [facility] onc, now pending establishing with [facility].  Unsure if he continues zytiga/prednisone treatment.  Concern for progression - see below.  Refill hydrocodone while he establishes with [facility].       Relevant Medications   HYDROcodone-acetaminophen (NORCO/VICODIN) 5-325 MG tablet   Other Relevant Orders   CT Abdomen Pelvis W Contrast   Bilateral flank pain    UA today negative for blood or infection pointing against kidney stone as  cause.  Check labs, update imaging.       Relevant Orders   POCT Urinalysis Dipstick (Automated) (Completed)   CT Abdomen Pelvis W Contrast   History of GI bleed    H/o UGI bleed s/p hospitalization 12/2021 (acute gastric ulcer and duodenal ulceration). Reviewed need to continue omeprazole 20mg  daily.   He saw [facility] in follow up 01/2022 - plan was rpt EGD 03/2022, not completed. Will re-refer.       Relevant Orders   Ambulatory referral to Gastroenterology   Cognitive and behavioral changes    Reviewed need for neurology follow up. # provided to call and schedule appt.       Positive RPR test    Today denies specific h/o syphilis treatment as a young adult.       Pedal edema - Primary    Reviewed Ddx of this condition including heart, liver, kidneys, med related, CVI, thyroid, pelvic venous or lymphatic obstruction.  Suspect possible liver etiology.  Check labwork today to further evaluate.  Bilateral leg swelling points against DVT.       Relevant Orders   Comprehensive metabolic panel (Completed)   TSH (Completed)   CBC with Differential/Platelet (Completed)   Brain natriuretic peptide (Completed)  Constipation    Present for the past 2 weeks.  Reviewed bowel regimen. Start MiraLAX half to 1 capful daily as needed constipation, hold for diarrhea.      Other Visit Diagnoses     Right lower quadrant abdominal mass       Relevant Orders   CT Abdomen Pelvis W Contrast        Meds ordered this encounter  Medications   HYDROcodone-acetaminophen (NORCO/VICODIN) 5-325 MG tablet    Sig: Take 1 tablet by mouth 2 (two) times daily as needed for moderate pain.    Dispense:  30 tablet    Refill:  0   polyethylene glycol powder (GLYCOLAX/MIRALAX) 17 GM/SCOOP powder    Sig: Take 8.5 g by mouth daily as needed for moderate constipation.    Dispense:  3350 g    Refill:  0    Orders Placed This Encounter  Procedures   CT Abdomen Pelvis W Contrast    Standing Status:    Future    Standing Expiration Date:   10/13/2023    Order Specific Question:   If indicated for the ordered procedure, I authorize the administration of contrast media per Radiology protocol    Answer:   Yes    Order Specific Question:   Does the patient have a contrast media/X-ray dye allergy?    Answer:   No    Order Specific Question:   Preferred imaging location?    Answer:   [facility]    Order Specific Question:   If indicated for the ordered procedure, I authorize the administration of oral contrast media per Radiology protocol    Answer:   Yes   Comprehensive metabolic panel   TSH   CBC with Differential/Platelet   Brain natriuretic peptide   Ambulatory referral to Gastroenterology    Referral Priority:   Routine    Referral Type:   Consultation    Referral Reason:   Specialty Services Required    Number of Visits Requested:   1   POCT Urinalysis Dipstick (Automated)    Patient Instructions  Continue omeprazole 20mg  daily for stomach ulcer prevention.  Labs and urine test today.  Ensure staying well hydrated.  For constipation - start miralax 1/2-1 capful daily in 8oz fluids, hold for diarrhea.  For side pain - start with tylenol 500mg . If breakthrough pain, may take hydrocodone. I've refilled this today I will order CT abdomen /pelvis for further evaluation of abdominal pain and new lump to abdominal wall  Question if history of syphilis is causing memory trouble (neurosyphilis). Call [facility] neurology to schedule appointment with Dr Sherryll Burger [phone].   Follow up plan: No follow-ups on file.  Eustaquio Boyden, MD

## 2022-10-10 NOTE — Patient Instructions (Addendum)
Continue omeprazole 20mg  daily for stomach ulcer prevention.  Labs and urine test today.  Ensure staying well hydrated.  For constipation - start miralax 1/2-1 capful daily in 8oz fluids, hold for diarrhea.  For side pain - start with tylenol 500mg . If breakthrough pain, may take hydrocodone. I've refilled this today I will order CT abdomen /pelvis for further evaluation of abdominal pain and new lump to abdominal wall  Question if history of syphilis is causing memory trouble (neurosyphilis). Call [facility] neurology to schedule appointment with Dr Sherryll Burger [phone].

## 2022-10-13 DIAGNOSIS — K5900 Constipation, unspecified: Secondary | ICD-10-CM | POA: Insufficient documentation

## 2022-10-13 DIAGNOSIS — R600 Localized edema: Secondary | ICD-10-CM | POA: Insufficient documentation

## 2022-10-13 NOTE — Assessment & Plan Note (Addendum)
H/o UGI bleed s/p hospitalization 12/2021 (acute gastric ulcer and duodenal ulceration). Reviewed need to continue omeprazole 20mg  daily.   He saw [facility] in follow up 01/2022 - plan was rpt EGD 03/2022, not completed. Will re-refer.

## 2022-10-13 NOTE — Assessment & Plan Note (Addendum)
Reviewed Ddx of this condition including heart, liver, kidneys, med related, CVI, thyroid, pelvic venous or lymphatic obstruction.  Suspect possible liver etiology.  Check labwork today to further evaluate.  Bilateral leg swelling points against DVT.

## 2022-10-13 NOTE — Assessment & Plan Note (Addendum)
Known h/o this.  Was followed by [facility] onc, now pending establishing with [facility].  Unsure if he continues zytiga/prednisone treatment.  Concern for progression - see below.  Refill hydrocodone while he establishes with [facility].

## 2022-10-13 NOTE — Assessment & Plan Note (Signed)
Present for the past 2 weeks.  Reviewed bowel regimen. Start MiraLAX half to 1 capful daily as needed constipation, hold for diarrhea.

## 2022-10-13 NOTE — Assessment & Plan Note (Addendum)
Reviewed need for neurology follow up. # provided to call and schedule appt.

## 2022-10-13 NOTE — Assessment & Plan Note (Signed)
UA today negative for blood or infection pointing against kidney stone as cause.  Check labs, update imaging.

## 2022-10-13 NOTE — Assessment & Plan Note (Signed)
Today denies specific h/o syphilis treatment as a young adult.

## 2022-10-13 NOTE — Addendum Note (Signed)
Addended by: Eustaquio Boyden on: 10/13/2022 08:04 AM   Modules accepted: Orders

## 2022-10-13 NOTE — Assessment & Plan Note (Signed)
H/o this. Update LFTs and abdominal imaging.

## 2022-10-24 ENCOUNTER — Telehealth: Payer: Self-pay | Admitting: Family Medicine

## 2022-10-24 DIAGNOSIS — I10 Essential (primary) hypertension: Secondary | ICD-10-CM

## 2022-10-24 NOTE — Telephone Encounter (Addendum)
BP Readings from Last 3 Encounters:  10/10/22 134/66  09/16/22 (!) 101/59  08/20/22 132/70    Lab Results  Component Value Date   CREATININE 1.10 10/10/2022   BUN 17 10/10/2022   NA 141 10/10/2022   K 4.4 10/10/2022   CL 104 10/10/2022   CO2 29 10/10/2022   GFR = 63  Called pt, unable to reach.  Can we call Monday - what was reason for [facility] to [facility]mg ? Was BP elevated? BP in our office has been normal to low. Would he be willing to try 10mg  lisinopril dose first?

## 2022-10-24 NOTE — Telephone Encounter (Signed)
Pt called stating the [facility] suggested that his lisinopril (ZESTRIL) 5 MG tablet be increased to 20mg . Pt asked could the meds be increased? Call back # [phone]

## 2022-10-27 MED ORDER — LISINOPRIL 10 MG PO TABS: 10.0000 mg | ORAL_TABLET | Freq: Every day | ORAL | 1 refills | Status: DC

## 2022-10-27 NOTE — Telephone Encounter (Addendum)
I've sent lisinopril 10mg  to pharmacy. He may double up on what he has until he runs out. Monitor BP at home, let me know if consistently >140/90 or <110/60

## 2022-10-27 NOTE — Addendum Note (Signed)
Addended by: Eustaquio Boyden on: 10/27/2022 01:54 PM   Modules accepted: Orders

## 2022-10-27 NOTE — Telephone Encounter (Signed)
Spoke with pt asking why [facility] to 20 mg. Pt says his BP was elevated at their office but she didn't say why they were increasing lisinopril to 20 mg. Pt agrees to trying 10 mg first.

## 2022-10-27 NOTE — Telephone Encounter (Signed)
Lvm asking pt to call back.  Need to relay Dr. G's message.  

## 2022-10-28 NOTE — Telephone Encounter (Signed)
Pt called back returning Lisa's call. Told pt Dr. Timoteo Expose response. Pt had no questions/concerns. Call back # [phone]

## 2022-10-28 NOTE — Telephone Encounter (Signed)
Noted  

## 2022-11-03 ENCOUNTER — Other Ambulatory Visit: Payer: Self-pay | Admitting: Family Medicine

## 2022-11-03 DIAGNOSIS — C61 Malignant neoplasm of prostate: Secondary | ICD-10-CM

## 2022-11-03 DIAGNOSIS — F5104 Psychophysiologic insomnia: Secondary | ICD-10-CM

## 2022-11-03 NOTE — Telephone Encounter (Signed)
Pt has OV tomorrow with Dr. Reece Agar.

## 2022-11-03 NOTE — Telephone Encounter (Signed)
Pt has OV tomorrow with Dr. G. 

## 2022-11-04 ENCOUNTER — Ambulatory Visit (INDEPENDENT_AMBULATORY_CARE_PROVIDER_SITE_OTHER): Payer: Medicare Other | Admitting: Family Medicine

## 2022-11-04 ENCOUNTER — Encounter: Payer: Self-pay | Admitting: *Deleted

## 2022-11-04 ENCOUNTER — Encounter: Payer: Self-pay | Admitting: Family Medicine

## 2022-11-04 VITALS — BP 100/60 | HR 71 | Temp 97.9°F | Ht 66.0 in | Wt 165.1 lb

## 2022-11-04 DIAGNOSIS — C61 Malignant neoplasm of prostate: Secondary | ICD-10-CM | POA: Diagnosis not present

## 2022-11-04 DIAGNOSIS — I10 Essential (primary) hypertension: Secondary | ICD-10-CM | POA: Diagnosis not present

## 2022-11-04 DIAGNOSIS — F5104 Psychophysiologic insomnia: Secondary | ICD-10-CM

## 2022-11-04 DIAGNOSIS — L989 Disorder of the skin and subcutaneous tissue, unspecified: Secondary | ICD-10-CM

## 2022-11-04 DIAGNOSIS — C7951 Secondary malignant neoplasm of bone: Secondary | ICD-10-CM | POA: Diagnosis not present

## 2022-11-04 MED ORDER — TEMAZEPAM 15 MG PO CAPS
15.0000 mg | ORAL_CAPSULE | Freq: Every day | ORAL | 0 refills | Status: DC
Start: 2022-11-04 — End: 2022-12-08

## 2022-11-04 MED ORDER — HYDROCODONE-ACETAMINOPHEN 5-325 MG PO TABS
1.0000 | ORAL_TABLET | Freq: Every evening | ORAL | 0 refills | Status: DC | PRN
Start: 2022-11-04 — End: 2022-11-20

## 2022-11-04 NOTE — Patient Instructions (Addendum)
You have a open sore to the right posterior thigh.  Dress with antibiotic ointment daily and bandage.  Let us know if not healing over next few weeks.  Avoid prolonged pressure (ie prolonged sitting or laying down) to the area.   Blood pressure was low today - don't take more than 10mg  lisinopril at a time (if you fill 20mg  tablets, only take 1/2 tablet).   Drop temazepam to 15mg  nightly - new capsule sent to pharmacy.  Continue trazodone 50mg  nightly for sleep.  Only take hydrocodone at night as needed for pain.

## 2022-11-04 NOTE — Progress Notes (Unsigned)
Ph: [phone] Fax: [phone]   Patient ID: Justin Ali, male    DOB: January 26, 1941, 82 y.o.   MRN: [id number]  This visit was conducted in person.  BP 100/60   Pulse 71   Temp 97.9 F (36.6 C) (Temporal)   Ht 5\' 6"  (1.676 m)   Wt 165 lb 2 oz (74.9 kg)   SpO2 96%   BMI 26.65 kg/m   BP Readings from Last 3 Encounters:  11/04/22 100/60  10/10/22 134/66  09/16/22 (!) 101/59   CC: R leg skin lesion Subjective:   HPI: Justin Ali is a 82 y.o. male presenting on 11/04/2022 for Cyst (C/o cyst on upper R leg. Noticed 3-4 days ago. Denies pain/irritation. )   Doesn't feel well today.   Several days ago noticed cyst/lesion to right upper leg. Asymptomatic. Was told he was bleeding - that's how he found out.   Lisinopril dose recently increased from 5mg  to 10mg  [facility]. BP running low today, however pt denies significant dizziness or other hypotensive symptoms.      Relevant past medical, surgical, family and social history reviewed and updated as indicated. Interim medical history since our last visit reviewed. Allergies and medications reviewed and updated. Outpatient Medications Prior to Visit  Medication Sig Dispense Refill   abiraterone acetate (ZYTIGA) 250 MG tablet Take 4 tablets (1,000 mg total) by mouth daily. Take on an empty stomach 1 hour before or 2 hours after a meal 120 tablet 5   CALCIUM PO Take 1,200 mg by mouth daily.     cholecalciferol (VITAMIN D) 1000 UNITS tablet Take 2,000 Units by mouth daily.     clotrimazole (LOTRIMIN) 1 % cream Apply 1 application topically 2 (two) times daily. For 2 weeks or until full resolution 30 g 0   furosemide (LASIX) 40 MG tablet Take 1 tablet (40 mg total) by mouth daily. For swelling     gabapentin (NEURONTIN) 300 MG capsule Take 1 capsule (300 mg total) by mouth at bedtime.     Omega-3 Fatty Acids (FISH OIL) 1000 MG CAPS Take 1 capsule (1,000 mg total) by mouth daily.     omeprazole (PRILOSEC) 20  MG capsule Take 1 capsule (20 mg total) by mouth daily. 90 capsule 4   polyethylene glycol powder (GLYCOLAX/MIRALAX) 17 GM/SCOOP powder Take 8.5 g by mouth daily as needed for moderate constipation. 3350 g 0   predniSONE (DELTASONE) 5 MG tablet Take 1 tablet (5 mg total) by mouth daily with breakfast. 30 tablet 5   amLODipine (NORVASC) 5 MG tablet Take 1 tablet (5 mg total) by mouth daily. 90 tablet 4   HYDROcodone-acetaminophen (NORCO/VICODIN) 5-325 MG tablet Take 1 tablet by mouth 2 (two) times daily as needed for moderate pain. 30 tablet 0   lisinopril (ZESTRIL) 20 MG tablet Take by mouth.     temazepam (RESTORIL) 30 MG capsule Take 1 capsule (30 mg total) by mouth at bedtime. 30 capsule 0   traZODone (DESYREL) 50 MG tablet Take 1 tablet (50 mg total) by mouth 2 (two) times daily. 180 tablet 4   amLODipine (NORVASC) 5 MG tablet Take 1 tablet (5 mg total) by mouth daily. For blood pressure     lisinopril (ZESTRIL) 20 MG tablet Take 0.5 tablets (10 mg total) by mouth daily. For blood pressure     traZODone (DESYREL) 50 MG tablet Take 1 tablet (50 mg total) by mouth at bedtime.     lisinopril (ZESTRIL) 10  MG tablet Take 1 tablet (10 mg total) by mouth daily. 90 tablet 1   No facility-administered medications prior to visit.     Per HPI unless specifically indicated in ROS section below Review of Systems  Objective:  BP 100/60   Pulse 71   Temp 97.9 F (36.6 C) (Temporal)   Ht 5\' 6"  (1.676 m)   Wt 165 lb 2 oz (74.9 kg)   SpO2 96%   BMI 26.65 kg/m   Wt Readings from Last 3 Encounters:  11/04/22 165 lb 2 oz (74.9 kg)  10/10/22 177 lb 6 oz (80.5 kg)  09/16/22 176 lb (79.8 kg)      Physical Exam Vitals and nursing note reviewed.  Constitutional:      Appearance: Normal appearance.  Skin:    General: Skin is warm and dry.     Findings: Lesion and wound present.     Comments: 3.5 x 2 cm 3 separate sores to posterior R thigh as per picture  Neurological:     Mental Status: He is  alert.  Psychiatric:        Mood and Affect: Mood normal.        Behavior: Behavior normal.    R posterior thigh:     Results for orders placed or performed in visit on 10/10/22  Comprehensive metabolic panel  Result Value Ref Range   Sodium 141 135 - 145 mEq/L   Potassium 4.4 3.5 - 5.1 mEq/L   Chloride 104 96 - 112 mEq/L   CO2 29 19 - 32 mEq/L   Glucose, Bld 111 (H) 70 - 99 mg/dL   BUN 17 6 - 23 mg/dL   Creatinine, Ser 1.02 0.40 - 1.50 mg/dL   Total Bilirubin 0.5 0.2 - 1.2 mg/dL   Alkaline Phosphatase 51 39 - 117 U/L   AST 13 0 - 37 U/L   ALT 8 0 - 53 U/L   Total Protein 6.4 6.0 - 8.3 g/dL   Albumin 4.2 3.5 - 5.2 g/dL   GFR 72.53 >66.44 mL/min   Calcium 9.2 8.4 - 10.5 mg/dL  TSH  Result Value Ref Range   TSH 0.95 0.35 - 5.50 uIU/mL  CBC with Differential/Platelet  Result Value Ref Range   WBC 6.2 4.0 - 10.5 K/uL   RBC 4.02 (L) 4.22 - 5.81 Mil/uL   Hemoglobin 12.3 (L) 13.0 - 17.0 g/dL   HCT 03.4 (L) 74.2 - 59.5 %   MCV 92.0 78.0 - 100.0 fl   MCHC 33.1 30.0 - 36.0 g/dL   RDW 63.8 75.6 - 43.3 %   Platelets 215.0 150.0 - 400.0 K/uL   Neutrophils Relative % 80.0 (H) 43.0 - 77.0 %   Lymphocytes Relative 14.6 12.0 - 46.0 %   Monocytes Relative 4.5 3.0 - 12.0 %   Eosinophils Relative 0.4 0.0 - 5.0 %   Basophils Relative 0.5 0.0 - 3.0 %   Neutro Abs 4.9 1.4 - 7.7 K/uL   Lymphs Abs 0.9 0.7 - 4.0 K/uL   Monocytes Absolute 0.3 0.1 - 1.0 K/uL   Eosinophils Absolute 0.0 0.0 - 0.7 K/uL   Basophils Absolute 0.0 0.0 - 0.1 K/uL  Brain natriuretic peptide  Result Value Ref Range   Pro B Natriuretic peptide (BNP) 86.0 0.0 - 100.0 pg/mL  POCT Urinalysis Dipstick (Automated)  Result Value Ref Range   Color, UA dark yellow    Clarity, UA clear    Glucose, UA Negative Negative   Bilirubin, UA 1+  Ketones, UA +/-    Spec Grav, UA 1.020 1.010 - 1.025   Blood, UA negative    pH, UA 6.0 5.0 - 8.0   Protein, UA Positive (A) Negative   Urobilinogen, UA 0.2 0.2 or 1.0 E.U./dL    Nitrite, UA negative    Leukocytes, UA Negative Negative    Assessment & Plan:   Problem List Items Addressed This Visit     HYPERTENSION, BENIGN ESSENTIAL    Chronic on amlodipine 5mg  and lisinopril. He was previously on 5mg  daily. BP on this regimen always normal in office however he was recommended to increase lisinopril to 20mg  by [facility] physician. I recommended he not increase lisinopril past 10mg  due to concern over hypotension.       Relevant Medications   lisinopril (ZESTRIL) 20 MG tablet   furosemide (LASIX) 40 MG tablet   amLODipine (NORVASC) 5 MG tablet   Primary malignant 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 [facility])    [facility]iga) through them. Also on prednisone 5mg  daily.  I prescribe hydrocodone #30/month for bone pain from mets. Recommend change hydrocodone to nightly PRN (he was taking BID PRN).       Relevant Medications   HYDROcodone-acetaminophen (NORCO/VICODIN) 5-325 MG tablet   Chronic insomnia    Chronic managed with trazodone and temazepam. He is on several sedating medications including gabapentin and hydrocodone. I recommended tapering dose of temazepam to 15mg  nightly. We may increase trazodone if needed.       Relevant Medications   temazepam (RESTORIL) 15 MG capsule   traZODone (DESYREL) 50 MG tablet   Sore on leg - Primary    Small open wounds to R posterior thigh, unclear how he obtained this.  Wound care instructions provided - clean area, pat dry, dress with triple abx ointment and bandaid. He states he can ask a nurse near his home to help dress wound daily. Recommend avoiding prolonged pressure to area.         Meds ordered this encounter  Medications   HYDROcodone-acetaminophen (NORCO/VICODIN) 5-325 MG tablet    Sig: Take 1 tablet by mouth at bedtime as needed for moderate pain.    Dispense:  30 tablet    Refill:  0   temazepam (RESTORIL) 15 MG capsule    Sig: Take 1 capsule (15 mg  total) by mouth at bedtime.    Dispense:  30 capsule    Refill:  0    Note lower dose    No orders of the defined types were placed in this encounter.   Patient Instructions  You have a open sore to the right posterior thigh.  Dress with antibiotic ointment daily and bandage.  Let us know if not healing over next few weeks.  Avoid prolonged pressure (ie prolonged sitting or laying down) to the area.   Blood pressure was low today - don't take more than 10mg  lisinopril at a time (if you fill 20mg  tablets, only take 1/2 tablet).   Drop temazepam to 15mg  nightly - new capsule sent to pharmacy.  Continue trazodone 50mg  nightly for sleep.  Only take hydrocodone at night as needed for pain.   Follow up plan: Return in about 3 months (around 02/04/2023), or if symptoms worsen or fail to improve, for follow up visit.  Eustaquio Boyden, MD

## 2022-11-05 DIAGNOSIS — L989 Disorder of the skin and subcutaneous tissue, unspecified: Secondary | ICD-10-CM | POA: Insufficient documentation

## 2022-11-05 NOTE — Assessment & Plan Note (Signed)
Chronic managed with trazodone and temazepam. He is on several sedating medications including gabapentin and hydrocodone. I recommended tapering dose of temazepam to 15mg  nightly. We may increase trazodone if needed.

## 2022-11-05 NOTE — Assessment & Plan Note (Addendum)
Small open wounds to R posterior thigh, unclear how he obtained this.  Wound care instructions provided - clean area, pat dry, dress with triple abx ointment and bandaid. He states he can ask a nurse near his home to help dress wound daily. Recommend avoiding prolonged pressure to area.

## 2022-11-05 NOTE — Assessment & Plan Note (Signed)
Chronic on amlodipine 5mg  and lisinopril. He was previously on 5mg  daily. BP on this regimen always normal in office however he was recommended to increase lisinopril to 20mg  by [facility] physician. I recommended he not increase lisinopril past 10mg  due to concern over hypotension.

## 2022-11-05 NOTE — Assessment & Plan Note (Addendum)
[facility] (Zytiga) through them. Also on prednisone 5mg  daily.  I prescribe hydrocodone #30/month for bone pain from [facility]. Recommend change hydrocodone to nightly PRN (he was taking BID PRN).

## 2022-11-12 ENCOUNTER — Encounter: Payer: Self-pay | Admitting: Family Medicine

## 2022-11-14 ENCOUNTER — Other Ambulatory Visit: Payer: Self-pay | Admitting: Family Medicine

## 2022-11-14 DIAGNOSIS — C61 Malignant neoplasm of prostate: Secondary | ICD-10-CM

## 2022-11-14 NOTE — Telephone Encounter (Signed)
Name of Medication:  Hydrocodone-APAP Name of Pharmacy:  [facility] Last Fill or Written Date and Quantity:  11/04/22, #30 Last Office Visit and Type:  11/04/22, sore on R leg Next Office Visit and Type:  02/04/23, 3 mo f/u Last Controlled Substance Agreement Date:  none Last UDS:  none

## 2022-11-20 ENCOUNTER — Emergency Department: Payer: No Typology Code available for payment source

## 2022-11-20 ENCOUNTER — Emergency Department
Admission: EM | Admit: 2022-11-20 | Discharge: 2022-11-20 | Disposition: A | Discharge status: Home / Self Care | Payer: No Typology Code available for payment source | Attending: Emergency Medicine | Admitting: Emergency Medicine

## 2022-11-20 ENCOUNTER — Other Ambulatory Visit: Payer: Self-pay

## 2022-11-20 DIAGNOSIS — R531 Weakness: Secondary | ICD-10-CM | POA: Diagnosis not present

## 2022-11-20 DIAGNOSIS — M25551 Pain in right hip: Secondary | ICD-10-CM | POA: Diagnosis not present

## 2022-11-20 DIAGNOSIS — R5383 Other fatigue: Secondary | ICD-10-CM | POA: Insufficient documentation

## 2022-11-20 DIAGNOSIS — C7951 Secondary malignant neoplasm of bone: Secondary | ICD-10-CM | POA: Diagnosis not present

## 2022-11-20 DIAGNOSIS — M25552 Pain in left hip: Secondary | ICD-10-CM | POA: Diagnosis not present

## 2022-11-20 DIAGNOSIS — R0902 Hypoxemia: Secondary | ICD-10-CM | POA: Diagnosis not present

## 2022-11-20 DIAGNOSIS — Z1152 Encounter for screening for COVID-19: Secondary | ICD-10-CM | POA: Diagnosis not present

## 2022-11-20 LAB — CBC
HCT: 35.4 % — ABNORMAL LOW (ref 39.0–52.0)
Hemoglobin: 12.3 g/dL — ABNORMAL LOW (ref 13.0–17.0)
MCH: 31.1 pg (ref 26.0–34.0)
MCHC: 34.7 g/dL (ref 30.0–36.0)
MCV: 89.6 fL (ref 80.0–100.0)
Platelets: 177 10*3/uL (ref 150–400)
RBC: 3.95 MIL/uL — ABNORMAL LOW (ref 4.22–5.81)
RDW: 13.1 % (ref 11.5–15.5)
WBC: 5.3 10*3/uL (ref 4.0–10.5)
nRBC: 0 % (ref 0.0–0.2)

## 2022-11-20 LAB — URINALYSIS, ROUTINE W REFLEX MICROSCOPIC
Bilirubin Urine: NEGATIVE
Glucose, UA: NEGATIVE mg/dL
Hgb urine dipstick: NEGATIVE
Ketones, ur: NEGATIVE mg/dL
Leukocytes,Ua: NEGATIVE
Nitrite: NEGATIVE
Protein, ur: NEGATIVE mg/dL
Specific Gravity, Urine: 1.008 (ref 1.005–1.030)
pH: 7 (ref 5.0–8.0)

## 2022-11-20 LAB — BASIC METABOLIC PANEL
Anion gap: 8 (ref 5–15)
BUN: 19 mg/dL (ref 8–23)
CO2: 31 mmol/L (ref 22–32)
Calcium: 9.2 mg/dL (ref 8.9–10.3)
Chloride: 104 mmol/L (ref 98–111)
Creatinine, Ser: 1.4 mg/dL — ABNORMAL HIGH (ref 0.61–1.24)
GFR, Estimated: 50 mL/min — ABNORMAL LOW (ref >60)
Glucose, Bld: 116 mg/dL — ABNORMAL HIGH (ref 70–99)
Potassium: 3 mmol/L — ABNORMAL LOW (ref 3.5–5.1)
Sodium: 143 mmol/L (ref 135–145)

## 2022-11-20 LAB — SARS CORONAVIRUS 2 BY RT PCR: SARS Coronavirus 2 by RT PCR: NEGATIVE

## 2022-11-20 MED ORDER — POTASSIUM CHLORIDE CRYS ER 20 MEQ PO TBCR
40.0000 meq | EXTENDED_RELEASE_TABLET | Freq: Once | ORAL | Status: AC
  Administered 2022-11-20: 40 meq via ORAL
  Filled 2022-11-20: qty 2

## 2022-11-20 NOTE — Telephone Encounter (Signed)
ERx 

## 2022-11-20 NOTE — ED Triage Notes (Signed)
Pt here from [facility] via [facility] with weakness. Pt has not ate for 2-3 days. Vitals stable. Pt also has chronic bilateral hip pain.   60 128/60 99% RA

## 2022-11-20 NOTE — ED Provider Notes (Signed)
[facility] Provider Note    Event Date/Time   First MD Initiated Contact with Patient 11/20/22 1244     (approximate)   History   Fatigue   HPI  Justin Ali is a 82 y.o. male with a history of hypertension, CAD who presents with complaints of fatigue.  Patient reports over the last 2 days he has had increased fatigue and has been sleeping most of the day.  This is atypical for him.  Typically he exercises frequently.  He denies chest pain, no abdominal pain, no fever, no chills, no cough     Physical Exam   Triage Vital Signs: ED Triage Vitals  Encounter Vitals Group     BP 11/20/22 1236 98/70     Systolic BP Percentile --      Diastolic BP Percentile --      Pulse Rate 11/20/22 1236 (!) 52     Resp 11/20/22 1236 17     Temp 11/20/22 1236 98.2 F (36.8 C)     Temp Source 11/20/22 1236 Oral     SpO2 11/20/22 1236 99 %     Weight 11/20/22 1234 80.7 kg (178 lb)     Height 11/20/22 1234 1.676 m (5\' 6" )     Head Circumference --      Peak Flow --      Pain Score 11/20/22 1234 3     Pain Loc --      Pain Education --      Exclude from Growth Chart --     Most recent vital signs: Vitals:   11/20/22 1314 11/20/22 1330  BP: 128/66 134/67  Pulse: (!) 57 (!) 51  Resp: 15 12  Temp: 98 F (36.7 C)   SpO2: 99% 99%     General: Awake, no distress.  CV:  Good peripheral perfusion.  Resp:  Normal effort.  Clear to auscultation bilaterally Abd:  No distention.  Other:  No lower extremity swelling or edema   ED Results / Procedures / Treatments   Labs (all labs ordered are listed, but only abnormal results are displayed) Labs Reviewed  BASIC METABOLIC PANEL - Abnormal; Notable for the following components:      Result Value   Potassium 3.0 (*)    Glucose, Bld 116 (*)    Creatinine, Ser 1.40 (*)    GFR, Estimated 50 (*)    All other components within normal limits  CBC - Abnormal; Notable for the following components:   RBC 3.95  (*)    Hemoglobin 12.3 (*)    HCT 35.4 (*)    All other components within normal limits  URINALYSIS, ROUTINE W REFLEX MICROSCOPIC - Abnormal; Notable for the following components:   Color, Urine YELLOW (*)    APPearance CLEAR (*)    All other components within normal limits  SARS CORONAVIRUS 2 BY RT PCR     EKG  ED ECG REPORT I, Jene Every, the attending physician, personally viewed and interpreted this ECG.  Date: 11/20/2022  Rhythm: normal sinus rhythm QRS Axis: normal Intervals: normal ST/T Wave abnormalities: None Narrative Interpretation: no evidence of acute ischemia    RADIOLOGY Chest x-ray with bony lesions, these are apparently chronic per radiology    PROCEDURES:  Critical Care performed:   Procedures   MEDICATIONS ORDERED IN ED: Medications  potassium chloride SA (KLOR-CON M) CR tablet 40 mEq (has no administration in time range)     IMPRESSION / MDM / ASSESSMENT  AND PLAN / ED COURSE  I reviewed the triage vital signs and the nursing notes. Patient's presentation is most consistent with acute presentation with potential threat to life or bodily function.  Patient presents with generalized fatigue as detailed above, vital signs are all reassuring, afebrile.  Differential includes infection such as pneumonia, COVID, UTI, electrolyte abnormality.  No chest pain to suggest cardiac cause.  Will obtain labs, chest x-ray and reevaluate.  Lab work, x-ray is reassuring, urinalysis is unremarkable.  Discussed results with patient and offered admission however he declined, he would like to be discharged.  I discussed with him that he can return if any worsening symptoms      FINAL CLINICAL IMPRESSION(S) / ED DIAGNOSES   Final diagnoses:  Fatigue, unspecified type     Rx / DC Orders   ED Discharge Orders     None        Note:  This document was prepared using Dragon voice recognition software and may include unintentional dictation  errors.   Jene Every, MD 11/20/22 [phone]

## 2022-11-20 NOTE — ED Notes (Signed)
See triage note. Pt came in for fatigue. Pt has not been able to eat for 2 days and has been mostly sleeping.

## 2022-11-20 NOTE — ED Notes (Signed)
Called [facility] to give report since patient is being discharged. They stated "due to it being independent living there is no nurse to give discharge instructions to." Discharge instructions were given to the patient. Called facility back to find transportation for patient due to arriving by EMS. Receptionist called the bus and they are going to come pick up patient asap.

## 2022-11-20 NOTE — ED Triage Notes (Signed)
Refer to First Nurse Note

## 2022-11-20 NOTE — ED Notes (Signed)
Patient attempted to use urinal to provide urine sample but unable to at this time.

## 2022-11-27 ENCOUNTER — Telehealth: Payer: Self-pay

## 2022-11-27 NOTE — Telephone Encounter (Signed)
Transition Care Management Unsuccessful Follow-up Telephone Call  Date of discharge and from where:  [facility] 7/25  Attempts:  1st Attempt  Reason for unsuccessful TCM follow-up call:  No answer/busy   [name] [facility] Guide, [facility] [phone] [address] [place], [place], [place] [address] Phone: [phone] Email: [email or web address]

## 2022-11-27 NOTE — Telephone Encounter (Signed)
Transition Care Management Follow-up Telephone Call Date of discharge and from where: [facility] 7/25 How have you been since you were released from the hospital? Doing better, PT is going to [facility] next week  Any questions or concerns? No  Items Reviewed: Did the pt receive and understand the discharge instructions provided? Yes  Medications obtained and verified? No  Other? No  Any new allergies since your discharge? No  Dietary orders reviewed? No Do you have support at home? Yes  SNF    Follow up appointments reviewed:  PCP Hospital f/u appt confirmed? No Scheduled to see on  @ . Specialist Hospital f/u appt confirmed? Yes  Scheduled to see  on 8/5 @ . Are transportation arrangements needed? No  If their condition worsens, is the pt aware to call PCP or go to the Emergency Dept.? Yes Was the patient provided with contact information for the PCP's office or ED? Yes Was to pt encouraged to call back with questions or concerns? Yes

## 2022-12-05 ENCOUNTER — Other Ambulatory Visit: Payer: Self-pay | Admitting: Family Medicine

## 2022-12-05 DIAGNOSIS — F5104 Psychophysiologic insomnia: Secondary | ICD-10-CM

## 2022-12-05 DIAGNOSIS — C61 Malignant neoplasm of prostate: Secondary | ICD-10-CM

## 2022-12-05 NOTE — Telephone Encounter (Signed)
Name of Medication: Temazepam, Hydrocodone-APAP Name of Pharmacy: [facility] Last Fill or Written Date and Quantity:       Temazepam- 11/24/22, #30      Hydrocodone-APAP- 11/20/22, #30 Last Office Visit and Type: 11/04/22, cyst on R leg Next Office Visit and Type: 02/04/23, 3 mo f/u Last Controlled Substance Agreement Date: none Last UDS: none

## 2022-12-08 NOTE — Telephone Encounter (Signed)
[place] CSRS reviewed. Temazepam and hydrocodone refilled. Please verify with pt that he doesn't receive hydrocodone from [place] as I don't think I can see that in [place] CSRS.

## 2022-12-08 NOTE — Telephone Encounter (Signed)
Spoke with pt notifying him refills were sent in. Also, I asked about hydrocodone rx. Pt states he does not get it via [place] but with Dr. Reece Agar.

## 2022-12-19 ENCOUNTER — Encounter: Payer: Self-pay | Admitting: Family Medicine

## 2022-12-19 ENCOUNTER — Ambulatory Visit (INDEPENDENT_AMBULATORY_CARE_PROVIDER_SITE_OTHER): Payer: Medicare Other | Admitting: Family Medicine

## 2022-12-19 VITALS — BP 108/60 | HR 70 | Temp 98.1°F | Ht 66.0 in | Wt 167.0 lb

## 2022-12-19 DIAGNOSIS — R413 Other amnesia: Secondary | ICD-10-CM | POA: Diagnosis not present

## 2022-12-19 DIAGNOSIS — C61 Malignant neoplasm of prostate: Secondary | ICD-10-CM

## 2022-12-19 DIAGNOSIS — N1831 Chronic kidney disease, stage 3a: Secondary | ICD-10-CM | POA: Diagnosis not present

## 2022-12-19 DIAGNOSIS — C7951 Secondary malignant neoplasm of bone: Secondary | ICD-10-CM | POA: Diagnosis not present

## 2022-12-19 DIAGNOSIS — R4689 Other symptoms and signs involving appearance and behavior: Secondary | ICD-10-CM

## 2022-12-19 DIAGNOSIS — G894 Chronic pain syndrome: Secondary | ICD-10-CM | POA: Diagnosis not present

## 2022-12-19 DIAGNOSIS — K5520 Angiodysplasia of colon without hemorrhage: Secondary | ICD-10-CM | POA: Diagnosis not present

## 2022-12-19 DIAGNOSIS — I10 Essential (primary) hypertension: Secondary | ICD-10-CM

## 2022-12-19 DIAGNOSIS — R4189 Other symptoms and signs involving cognitive functions and awareness: Secondary | ICD-10-CM

## 2022-12-19 NOTE — Patient Instructions (Addendum)
Call to schedule neurology appointment with Dr Sherryll Burger at [facility] [phone]. For evaluation of memory, see if you need memory medicine.  Stop amlodipine as blood pressures are running low.   Continue lasix 40mg  daily with potassium daily Continue lisinopril 5mg  daily (1/2 of 10mg  tablet).  Continue pantoprazole daily.  See new medicine list. Return to see me on October 9th, sooner if needed.

## 2022-12-19 NOTE — Progress Notes (Unsigned)
Ph: [phone] Fax: [phone]   Patient ID: Justin Ali, male    DOB: November 05, 1940, 82 y.o.   MRN: [id number]  This visit was conducted in person.  BP 108/60   Pulse 70   Temp 98.1 F (36.7 C) (Temporal)   Ht 5\' 6"  (1.676 m)   Wt 167 lb (75.8 kg)   SpO2 98%   BMI 26.95 kg/m   BP Readings from Last 3 Encounters:  12/19/22 108/60  11/20/22 111/85  11/04/22 100/60    CC: review medicines  Subjective:   HPI: Justin Ali is a 82 y.o. male presenting on 12/19/2022 for Medical Management of Chronic Issues   Seen at [facility] ER on 11/20/2022 for 2d worsening fatigue and sedation/somnolence. BP was 98/70, K low at 3.0, Cr 1.4 with eGFR 50. Treated with oral potassium replacement. UA negative for UTI, tested negative for COVID.   Brings labs from [place] from 12/10/2022 showing: Cr 1.4, glu 110, Na 145, K 3.3, Ca 8.7, eGFR 50  [facility]/2022 - he denied he had gone to ER.  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.  Was referred to neurology, did not go.   Working on transferring from [place] to [place].   Now receiving hydrocodone through [place], last received #60 10/17/2022. I last prescribed #30 12/08/2022. Discussed continuing pain medications through the [place], and I would stop prescribing.   11 lb weight loss in the past month.      Relevant past medical, surgical, family and social history reviewed and updated as indicated. Interim medical history since our last visit reviewed. Allergies and medications reviewed and updated. Outpatient Medications Prior to Visit  Medication Sig Dispense Refill   abiraterone acetate (ZYTIGA) 250 MG tablet Take 4 tablets (1,000 mg total) by mouth daily. Take on an empty stomach 1 hour before or 2 hours after a meal 120 tablet 5   cholecalciferol (VITAMIN D)  1000 UNITS tablet Take 2,000 Units by mouth daily.     furosemide (LASIX) 40 MG tablet Take 1 tablet (40 mg total) by mouth daily. For swelling     gabapentin (NEURONTIN) 300 MG capsule Take 1 capsule (300 mg total) by mouth at bedtime.     HYDROcodone-acetaminophen (NORCO/VICODIN) 5-325 MG tablet Take 1 tablet by mouth at bedtime as needed for moderate pain. 30 tablet 0   pantoprazole (PROTONIX) 40 MG tablet Take 40 mg by mouth daily.     polyethylene glycol powder (GLYCOLAX/MIRALAX) 17 GM/SCOOP powder Take 8.5 g by mouth daily as needed for moderate constipation. 3350 g 0   potassium chloride SA (KLOR-CON M) 20 MEQ tablet Take by mouth.     predniSONE (DELTASONE) 5 MG tablet Take 1 tablet (5 mg total) by mouth daily with breakfast. 30 tablet 5   temazepam (RESTORIL) 15 MG capsule Take 1 capsule (15 mg total)by mouth at bedtime. 30 capsule 0   traZODone (DESYREL) 50 MG tablet Take 1 tablet (50 mg total) by mouth at bedtime.     amLODipine (NORVASC) 5 MG tablet Take 1 tablet (5 mg total) by mouth daily. For blood pressure     lisinopril (ZESTRIL) 20 MG tablet Take 0.5 tablets (10 mg total) by mouth daily. For blood pressure     CALCIUM PO Take 1,200 mg by mouth daily. (Patient not  taking: Reported on 12/19/2022)     clotrimazole (LOTRIMIN) 1 % cream Apply 1 application topically 2 (two) times daily. For 2 weeks or until full resolution (Patient not taking: Reported on 12/19/2022) 30 g 0   lisinopril (ZESTRIL) 10 MG tablet Take 0.5 tablets (5 mg total) by mouth daily. For blood pressure     Omega-3 Fatty Acids (FISH OIL) 1000 MG CAPS Take 1 capsule (1,000 mg total) by mouth daily. (Patient not taking: Reported on 12/19/2022)     omeprazole (PRILOSEC) 20 MG capsule Take 1 capsule (20 mg total) by mouth daily. (Patient not taking: Reported on 12/19/2022) 90 capsule 4   No facility-administered medications prior to visit.     Per HPI unless specifically indicated in ROS section below Review of  Systems  Objective:  BP 108/60   Pulse 70   Temp 98.1 F (36.7 C) (Temporal)   Ht 5\' 6"  (1.676 m)   Wt 167 lb (75.8 kg)   SpO2 98%   BMI 26.95 kg/m   Wt Readings from Last 3 Encounters:  12/19/22 167 lb (75.8 kg)  11/20/22 178 lb (80.7 kg)  11/04/22 165 lb 2 oz (74.9 kg)      Physical Exam Vitals and nursing note reviewed.  Constitutional:      Appearance: Normal appearance. He is not ill-appearing.  HENT:     Head: Normocephalic and atraumatic.     Mouth/Throat:     Mouth: Mucous membranes are moist.     Pharynx: Oropharynx is clear. No oropharyngeal exudate or posterior oropharyngeal erythema.  Eyes:     Conjunctiva/sclera: Conjunctivae normal.     Pupils: Pupils are equal, round, and reactive to light.  Cardiovascular:     Rate and Rhythm: Normal rate and regular rhythm.     Pulses: Normal pulses.     Heart sounds: Normal heart sounds. No murmur heard. Pulmonary:     Effort: Pulmonary effort is normal. No respiratory distress.     Breath sounds: Normal breath sounds. No wheezing, rhonchi or rales.  Musculoskeletal:     Right lower leg: Edema (1+) present.     Left lower leg: Edema (1+) present.  Skin:    General: Skin is warm and dry.     Findings: No rash.  Neurological:     Mental Status: He is alert.  Psychiatric:        Mood and Affect: Mood normal.        Behavior: Behavior normal.       Results for orders placed or performed during the hospital encounter of 11/20/22  SARS Coronavirus 2 by RT PCR (hospital order, performed in [facility] hospital lab) *cepheid single result test* Anterior Nasal Swab   Specimen: Anterior Nasal Swab  Result Value Ref Range   SARS Coronavirus 2 by RT PCR NEGATIVE NEGATIVE  Basic metabolic panel  Result Value Ref Range   Sodium 143 135 - 145 mmol/L   Potassium 3.0 (L) 3.5 - 5.1 mmol/L   Chloride 104 98 - 111 mmol/L   CO2 31 22 - 32 mmol/L   Glucose, Bld 116 (H) 70 - 99 mg/dL   BUN 19 8 - 23 mg/dL   Creatinine, Ser  0.98 (H) 0.61 - 1.24 mg/dL   Calcium 9.2 8.9 - 11.9 mg/dL   GFR, Estimated 50 (L) >60 mL/min   Anion gap 8 5 - 15  CBC  Result Value Ref Range   WBC 5.3 4.0 - 10.5 K/uL   RBC 3.95 (  L) 4.22 - 5.81 MIL/uL   Hemoglobin 12.3 (L) 13.0 - 17.0 g/dL   HCT 91.4 (L) 78.2 - 95.6 %   MCV 89.6 80.0 - 100.0 fL   MCH 31.1 26.0 - 34.0 pg   MCHC 34.7 30.0 - 36.0 g/dL   RDW 21.3 08.6 - 57.8 %   Platelets 177 150 - 400 K/uL   nRBC 0.0 0.0 - 0.2 %  Urinalysis, Routine w reflex microscopic -Urine, Clean Catch  Result Value Ref Range   Color, Urine YELLOW (A) YELLOW   APPearance CLEAR (A) CLEAR   Specific Gravity, Urine 1.008 1.005 - 1.030   pH 7.0 5.0 - 8.0   Glucose, UA NEGATIVE NEGATIVE mg/dL   Hgb urine dipstick NEGATIVE NEGATIVE   Bilirubin Urine NEGATIVE NEGATIVE   Ketones, ur NEGATIVE NEGATIVE mg/dL   Protein, ur NEGATIVE NEGATIVE mg/dL   Nitrite NEGATIVE NEGATIVE   Leukocytes,Ua NEGATIVE NEGATIVE    Assessment & Plan:   Problem List Items Addressed This Visit     HYPERTENSION, BENIGN ESSENTIAL - Primary    Currently on amlodipine 5mg  daily along with lisinopril 5mg  daily. BP running low - will recommend stopping amlodipine, reassess control off this.  He continues lasix 40mg  daily.       Relevant Medications   lisinopril (ZESTRIL) 10 MG tablet   Angiodysplasia of cecum    Discussed need for longterm PPI use.       Relevant Medications   lisinopril (ZESTRIL) 10 MG tablet   Primary malignant 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 [facility])    [facility] [place] to [facility].       Chronic kidney disease, stage 3a (HCC)    Chronic, GFR 50s with Cr 1.4.  Will taper antihypertensives and reassess kidney function       Memory deficit   Cognitive and behavioral changes    Deteriorated.  Memory difficulties associated with behavioral changes.  ?neurosyphilis contributing in h/o positive RPR (see prior notes).  Will ask referral team to  reassess.       Chronic pain syndrome    Chronic bone pain related to metastatic prostate cancer  Now receiving opiates through the [place] - discussed I would stop prescribing.         No orders of the defined types were placed in this encounter.   No orders of the defined types were placed in this encounter.   Patient Instructions  Call to schedule neurology appointment with Dr Sherryll Burger at [facility] [phone] for evaluation of memory difficulty.  Stop amlodipine as blood pressures are running low.   Continue lasix 40mg  daily with potassium daily Continue lisinopril 5mg  daily (1/2 of 10mg  tablet).  Continue pantoprazole daily.  See new medicine list. Return to see me on October 9th, sooner if needed.   Follow up plan: Return if symptoms worsen or fail to improve.  Eustaquio Boyden, MD

## 2022-12-21 DIAGNOSIS — G894 Chronic pain syndrome: Secondary | ICD-10-CM | POA: Insufficient documentation

## 2022-12-21 NOTE — Assessment & Plan Note (Signed)
[facility] from [place] to [facility].

## 2022-12-21 NOTE — Assessment & Plan Note (Signed)
Deteriorated.  Memory difficulties associated with behavioral changes.  ?neurosyphilis contributing in h/o positive RPR (see prior notes).  Will ask referral team to reassess.

## 2022-12-21 NOTE — Assessment & Plan Note (Signed)
Chronic, GFR 50s with Cr 1.4.  Will taper antihypertensives and reassess kidney function

## 2022-12-21 NOTE — Assessment & Plan Note (Signed)
Discussed need for longterm PPI use.

## 2022-12-21 NOTE — Assessment & Plan Note (Addendum)
Currently on amlodipine 5mg  daily along with lisinopril 5mg  daily. BP running low - will recommend stopping amlodipine, reassess control off this.  He continues lasix 40mg  daily.

## 2022-12-21 NOTE — Assessment & Plan Note (Signed)
Chronic bone pain related to metastatic prostate cancer  Now receiving opiates through the [place] - discussed I would stop prescribing.

## 2022-12-22 ENCOUNTER — Telehealth: Payer: Self-pay | Admitting: *Deleted

## 2022-12-22 NOTE — Telephone Encounter (Signed)
[facility] has reached out to the patient to schedule and there has been a need for the [place] to be involved with the referral.   Telephone Encounter - Justin Ali - 09/26/2022 3:12 PM EDT Formatting of this note might be different from the original. Received referral , tried to reach out to patient to schedule and VM Box full Last seen jan and [place] auth exp 06/11 Currently working on new Auth through [place]   The patient needs to contact [facility] to see if their [name] renewal has been updated with the [place].   [facility] - Neurology [address] [place], [place] [address] Phone: [phone]

## 2022-12-22 NOTE — Telephone Encounter (Signed)
-----   Message from Eustaquio Boyden sent at 12/21/2022 11:56 PM EDT ----- Regarding: = Can we check on status of neurology referral placed for this patient earlier in the year? Thanks, Wynona Canes

## 2023-01-05 ENCOUNTER — Other Ambulatory Visit: Payer: Self-pay | Admitting: Family Medicine

## 2023-01-05 DIAGNOSIS — F5104 Psychophysiologic insomnia: Secondary | ICD-10-CM

## 2023-01-05 NOTE — Telephone Encounter (Signed)
LAST APPOINTMENT DATE: 12/22/2022   NEXT APPOINTMENT DATE: 02/04/2023    LAST REFILL:12/08/2022   QTY: #30 no rf

## 2023-01-05 NOTE — Telephone Encounter (Signed)
Prescription Request  01/05/2023  LOV: 12/19/2022  What is the name of the medication or equipment? temazepam (RESTORIL) 15 MG capsule   Have you contacted your pharmacy to request a refill? No   Which pharmacy would you like this sent to?  [facility] - [place], [place] - [address] [place] [address] Phone: [phone] Fax: [phone]    Patient notified that their request is being sent to the clinical staff for review and that they should receive a response within 2 business days.   Please advise at [facility] [place] [phone]

## 2023-01-06 MED ORDER — TEMAZEPAM 15 MG PO CAPS
15.0000 mg | ORAL_CAPSULE | Freq: Every evening | ORAL | 0 refills | Status: DC | PRN
Start: 2023-01-06 — End: 2023-02-05

## 2023-01-06 NOTE — Telephone Encounter (Signed)
ERx 

## 2023-01-14 ENCOUNTER — Telehealth: Payer: Self-pay | Admitting: Family Medicine

## 2023-01-14 NOTE — Telephone Encounter (Addendum)
Attempted to contact pt's wife, Hope (on dpr), by calling home # listed in pt's chart. However, pt answered. I asked if his wife was with and if I could speak with her. Pt states he and wife are divorced as of 12/2021. Then he states that he's in [place]. I explained we received a call from Sherri (pt's daughter, not on dpr) and she is concerned about him. Pt then says that she is coming to get him. I acknowledged his statement and told him I will let Dr Reece Agar know. Pt expressed his thanks and disconnected the call.  Called pt's daughter asking for more details and to clarify some info relayed by pt. She states pt and wife are separated, since fall of 2023, due some verbal aggression expressed by pt. Pt now resides at [facility] in [place]. However, some time this morning, pt has driven his car to [place] and had gotten confused. He managed to stop at an [employer] who called the police due to pt "not making any sense". [place], [place] police dept brought pt to the station, then called Sherri letting her know what had happened and where he was. She is on her way to pick pt up now.   I made Sherri aware that I could not give her any of pt's info (HIPAA and her not listed on his dpr) but that I can relay anything she tells me. She verbalizes understanding. She states she is extremely concerned about this new level of confusion and pt's safety and wanted to see if there was anything else Dr Reece Agar can do to help. Says she was also given access to pt's MyChart. We scheduled OV on 01/15/22 at 2:00. Lavonna Rua will come with pt.

## 2023-01-14 NOTE — Telephone Encounter (Signed)
Patient daughter Justin Ali called in and stated that Justin Ali mental state is getting worse. He is more confused and not remembering a lot of stuff. She stated that she and her daughter are on the way to [place] to get him because he was confused where he was at and not able to drive back. She was wanting to know what they can do now regarding his mental state. Justin Ali can be reached at [phone] and if she doesn't answer you can call patients granddaughter Justin Ali at [phone] as patient will be in the car with one or the other. Thank you!

## 2023-01-16 ENCOUNTER — Ambulatory Visit (INDEPENDENT_AMBULATORY_CARE_PROVIDER_SITE_OTHER): Payer: Medicare Other | Admitting: Family Medicine

## 2023-01-16 ENCOUNTER — Encounter: Payer: Self-pay | Admitting: Family Medicine

## 2023-01-16 VITALS — BP 100/60 | HR 60 | Temp 97.7°F | Ht 66.0 in | Wt 159.0 lb

## 2023-01-16 DIAGNOSIS — Z8719 Personal history of other diseases of the digestive system: Secondary | ICD-10-CM | POA: Diagnosis not present

## 2023-01-16 DIAGNOSIS — G894 Chronic pain syndrome: Secondary | ICD-10-CM

## 2023-01-16 DIAGNOSIS — C7951 Secondary malignant neoplasm of bone: Secondary | ICD-10-CM

## 2023-01-16 DIAGNOSIS — R4689 Other symptoms and signs involving appearance and behavior: Secondary | ICD-10-CM

## 2023-01-16 DIAGNOSIS — R4189 Other symptoms and signs involving cognitive functions and awareness: Secondary | ICD-10-CM

## 2023-01-16 DIAGNOSIS — C61 Malignant neoplasm of prostate: Secondary | ICD-10-CM

## 2023-01-16 DIAGNOSIS — N1831 Chronic kidney disease, stage 3a: Secondary | ICD-10-CM | POA: Diagnosis not present

## 2023-01-16 DIAGNOSIS — R413 Other amnesia: Secondary | ICD-10-CM | POA: Diagnosis not present

## 2023-01-16 DIAGNOSIS — A530 Latent syphilis, unspecified as early or late: Secondary | ICD-10-CM

## 2023-01-16 DIAGNOSIS — F5104 Psychophysiologic insomnia: Secondary | ICD-10-CM

## 2023-01-16 DIAGNOSIS — R600 Localized edema: Secondary | ICD-10-CM

## 2023-01-16 DIAGNOSIS — C799 Secondary malignant neoplasm of unspecified site: Secondary | ICD-10-CM | POA: Diagnosis not present

## 2023-01-16 DIAGNOSIS — I10 Essential (primary) hypertension: Secondary | ICD-10-CM

## 2023-01-16 MED ORDER — CENTRUM SILVER 50+MEN PO TABS: 1.0000 | ORAL_TABLET | Freq: Every day | ORAL | Status: DC

## 2023-01-16 MED ORDER — HYDROCODONE-ACETAMINOPHEN 5-325 MG PO TABS: 1.0000 | ORAL_TABLET | Freq: Every day | ORAL | 0 refills | Status: DC

## 2023-01-16 MED ORDER — VITAMIN B-12 1000 MCG PO TABS: 1000.0000 ug | ORAL_TABLET | Freq: Every day | ORAL | Status: DC

## 2023-01-16 MED ORDER — VITAMIN C 500 MG PO CAPS: 1.0000 | ORAL_CAPSULE | Freq: Every day | ORAL | Status: DC

## 2023-01-16 NOTE — Progress Notes (Signed)
Ph: [phone] Fax: [phone]   Patient ID: Justin Ali, male    DOB: [birth year], 82 y.o.   MRN: [id number]  This visit was conducted in person.  BP 100/60   Pulse 60   Temp 97.7 F (36.5 C) (Temporal)   Ht 5\' 6"  (1.676 m)   Wt 159 lb (72.1 kg)   SpO2 95%   BMI 25.66 kg/m   BP Readings from Last 3 Encounters:  01/16/23 100/60  12/19/22 108/60  11/20/22 111/85   CC: f/u memory concerns Subjective:   HPI: Justin Ali is a 82 y.o. male presenting on 01/16/2023 for Medical Management of Chronic Issues and Memory Loss (Changes in memory )   Here with daughter Justin Ali who lives in [place], [place] with concerns over patient's memory - has had several falls recently, documented hypotension, getting lost while driving - while driving to [facility] got lost and ended up in [place], slept in his car. It seems he took [address] instead of [place]. Police were called and stayed with him until daughter was able to drive up to pick him and his car up.   Ongoing fluctuating mood swings causing outbursts  20 lb weight loss in the past 2 months.  We have been trying to get him to [facility] clinic neurology for evaluation unsuccessfully - pt has not returned calls to schedule appt.  From last referral coordinator note:  The patient needs to contact [facility] to see if their Auth renewal has been updated with the [place].   [facility] - Neurology [address] [place], [place] [address] Phone: [phone]  Last month's labs from [facility] reviewed - overall reassuring.  Whole body bone scan through [facility] 01/12/2023 - extensive abnormal radiotracer uptake throughout axial and proximal appendicular skeleton consistent with known osseous metastatic prostate cancer. Contrasted CT chest abd pelvis same date didn't show organ or lymph node involvement.  Next week is next [place] heme/onc appt.   Next [facility] appt is 02/27/2023 at 1:30pm.  Receiving hydrocodone through  [place], last received #60 10/17/2022. I last prescribed #30 12/08/2022. We have previously discussed continuing pain medications through the [place], and I would stop prescribing.   Previous BP regimen was amlodipine 5mg  daily and lisinopril 5mg  daily with lasix 40mg  daily. Last visit we recommended stopping amlodipine - he is not taking amlodipine.   He continues exercising regularly. No dyspnea, chest pain, fevers/chills, cough, diarrhea, abd pain, new rashes.  No UTI symptoms of dysuria, urgency, hematuria, nausea/vomiting or flank pain. He does note increased frequency without nocturia.      Relevant past medical, surgical, family and social history reviewed and updated as indicated. Interim medical history since our last visit reviewed. Allergies and medications reviewed and updated. Outpatient Medications Prior to Visit  Medication Sig Dispense Refill   abiraterone acetate (ZYTIGA) 250 MG tablet Take 4 tablets (1,000 mg total) by mouth daily. Take on an empty stomach 1 hour before or 2 hours after a meal 120 tablet 5   CALCIUM PO Take 1,200 mg by mouth daily.     cholecalciferol (VITAMIN D) 1000 UNITS tablet Take 2,000 Units by mouth daily.     gabapentin (NEURONTIN) 300 MG capsule Take 1 capsule (300 mg total) by mouth at bedtime.     Omega-3 Fatty Acids (FISH OIL) 1000 MG CAPS Take 1 capsule (1,000 mg total) by mouth daily.     pantoprazole (PROTONIX) 40 MG tablet Take 40  mg by mouth daily.     polyethylene glycol powder (GLYCOLAX/MIRALAX) 17 GM/SCOOP powder Take 8.5 g by mouth daily as needed for moderate constipation. 3350 g 0   potassium chloride SA (KLOR-CON M) 20 MEQ tablet Take 10 mEq by mouth.     predniSONE (DELTASONE) 5 MG tablet Take 1 tablet (5 mg total) by mouth daily with breakfast. 30 tablet 5   temazepam (RESTORIL) 15 MG capsule Take 1 capsule (15 mg total) by mouth at bedtime as needed for sleep. 30 capsule 0   traZODone (DESYREL) 50 MG tablet Take 1 tablet (50 mg total) by mouth  at bedtime.     furosemide (LASIX) 40 MG tablet Take 1 tablet (40 mg total) by mouth daily. For swelling     HYDROcodone-acetaminophen (NORCO/VICODIN) 5-325 MG tablet Take 1 tablet by mouth at bedtime as needed for moderate pain. 30 tablet 0   lisinopril (ZESTRIL) 10 MG tablet Take 0.5 tablets (5 mg total) by mouth daily. For blood pressure     clotrimazole (LOTRIMIN) 1 % cream Apply 1 application topically 2 (two) times daily. For 2 weeks or until full resolution (Patient not taking: Reported on 12/19/2022) 30 g 0   furosemide (LASIX) 20 MG tablet Take 1 tablet (20 mg total) by mouth daily. For swelling     No facility-administered medications prior to visit.     Per HPI unless specifically indicated in ROS section below Review of Systems  Objective:  BP 100/60   Pulse 60   Temp 97.7 F (36.5 C) (Temporal)   Ht 5\' 6"  (1.676 m)   Wt 159 lb (72.1 kg)   SpO2 95%   BMI 25.66 kg/m   Wt Readings from Last 3 Encounters:  01/16/23 159 lb (72.1 kg)  12/19/22 167 lb (75.8 kg)  11/20/22 178 lb (80.7 kg)      Physical Exam Vitals and nursing note reviewed.  Constitutional:      Appearance: Normal appearance. He is not ill-appearing.  HENT:     Head: Normocephalic and atraumatic.     Mouth/Throat:     Mouth: Mucous membranes are moist.     Pharynx: Oropharynx is clear. No oropharyngeal exudate or posterior oropharyngeal erythema.  Eyes:     Extraocular Movements: Extraocular movements intact.     Pupils: Pupils are equal, round, and reactive to light.  Cardiovascular:     Rate and Rhythm: Normal rate. Rhythm irregular.     Pulses: Normal pulses.     Heart sounds: Normal heart sounds. No murmur heard. Pulmonary:     Effort: Pulmonary effort is normal. No respiratory distress.     Breath sounds: Normal breath sounds. No wheezing, rhonchi or rales.  Musculoskeletal:     Right lower leg: Edema (1+) present.     Left lower leg: Edema (1+) present.  Skin:    General: Skin is warm and  dry.     Findings: No rash.  Neurological:     Mental Status: He is alert.  Psychiatric:        Mood and Affect: Mood normal.        Behavior: Behavior normal.       Results for orders placed or performed during the hospital encounter of 11/20/22  SARS Coronavirus 2 by RT PCR (hospital order, performed in [facility] hospital lab) *cepheid single result test* Anterior Nasal Swab   Specimen: Anterior Nasal Swab  Result Value Ref Range   SARS Coronavirus 2 by RT PCR NEGATIVE NEGATIVE  Basic metabolic panel  Result Value Ref Range   Sodium 143 135 - 145 mmol/L   Potassium 3.0 (L) 3.5 - 5.1 mmol/L   Chloride 104 98 - 111 mmol/L   CO2 31 22 - 32 mmol/L   Glucose, Bld 116 (H) 70 - 99 mg/dL   BUN 19 8 - 23 mg/dL   Creatinine, Ser 5.42 (H) 0.61 - 1.24 mg/dL   Calcium 9.2 8.9 - 70.6 mg/dL   GFR, Estimated 50 (L) >60 mL/min   Anion gap 8 5 - 15  CBC  Result Value Ref Range   WBC 5.3 4.0 - 10.5 K/uL   RBC 3.95 (L) 4.22 - 5.81 MIL/uL   Hemoglobin 12.3 (L) 13.0 - 17.0 g/dL   HCT 23.7 (L) 62.8 - 31.5 %   MCV 89.6 80.0 - 100.0 fL   MCH 31.1 26.0 - 34.0 pg   MCHC 34.7 30.0 - 36.0 g/dL   RDW 17.6 16.0 - 73.7 %   Platelets 177 150 - 400 K/uL   nRBC 0.0 0.0 - 0.2 %  Urinalysis, Routine w reflex microscopic -Urine, Clean Catch  Result Value Ref Range   Color, Urine YELLOW (A) YELLOW   APPearance CLEAR (A) CLEAR   Specific Gravity, Urine 1.008 1.005 - 1.030   pH 7.0 5.0 - 8.0   Glucose, UA NEGATIVE NEGATIVE mg/dL   Hgb urine dipstick NEGATIVE NEGATIVE   Bilirubin Urine NEGATIVE NEGATIVE   Ketones, ur NEGATIVE NEGATIVE mg/dL   Protein, ur NEGATIVE NEGATIVE mg/dL   Nitrite NEGATIVE NEGATIVE   Leukocytes,Ua NEGATIVE NEGATIVE   Lab Results  Component Value Date   VITAMINB12 856 08/15/2022    Lab Results  Component Value Date   NA 143 11/20/2022   CL 104 11/20/2022   K 3.0 (L) 11/20/2022   CO2 31 11/20/2022   BUN 19 11/20/2022   CREATININE 1.40 (H) 11/20/2022   GFRNONAA 50 (L)  11/20/2022   CALCIUM 9.2 11/20/2022   PHOS 4.5 08/15/2022   ALBUMIN 4.2 10/10/2022   GLUCOSE 116 (H) 11/20/2022    Assessment & Plan:   Problem List Items Addressed This Visit     HYPERTENSION, BENIGN ESSENTIAL    BP remains low despite recently stopping amlodipine.  Ongoing confusion over meds - daughter mentions he had multiple bottles of lisinopril at different doses.  Given ongoing hypotension and falls, CKD, will stop lisinopril and drop lasix to 20mg  daily. RTC 2 wks, reassess antihypertensive need off these medications.       Relevant Medications   furosemide (LASIX) 20 MG tablet   Primary malignant 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 [facility])    Has established with [facility] oncology - next appt is next week. Continues Zytiga daily with prednisone 5mg  daily.       Relevant Orders   Amb Referral to Palliative Care   Chronic insomnia    He continues trazodone 50 mg and temazepam 15mg  nightly.  Discussed concern with benzo in setting of memory difficulty and recommend slow taper off - rec drop temazepam to 15mg  every other night dosing.       Metastasis from malignant neoplasm of prostate [facility])   Relevant Orders   Amb Referral to Palliative Care   History of GI bleed    Hospitalization for UGI bleed with melena 12/2021 due to acute gastric and duodenal ulceration - continue omeprazole 40mg  daily. Avoiding donepezil for this reason.       Chronic kidney disease, stage 3a (HCC)  Reassess off ACEI.       Relevant Orders   Amb Referral to Palliative Care   Memory deficit - Primary    Ongoing fluctuating memory difficulty with behavioral changes including frequent periods of agitation/angry outbursts. More recently has gotten lost driving to [facility], ended up in [place]. Estranged from local wife Hope for the past year due to verbal threats. He lives alone in [place]. Daughter Investment banker, [employer])/granddaughter ([address] [place]) aren't local [place], [place]). We have  been trying to have him evaluated by neurology. Now daughter will work on scheduling appt. Recommend against driving due to safety concerns.  I will place palliative care referral - pt and daughter agree.  See below re positive RPR.  Caution with aricept in h/o GI bleed 2023.       Relevant Orders   Amb Referral to Palliative Care   Cognitive and behavioral changes    See above await neurology evaluation.       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 If that's the case, likely serofast.  Will refer to neurology for memory evaluation in abnormal MMSE and behavioral changes.      Pedal edema    Ongoing. Suspect partly related low protein levels from malnutrition. Discussed nutrition status, suggested trial ensure/boost. Continue lasix, drop dose as per above.       Relevant Orders   Amb Referral to Palliative Care   Chronic pain syndrome    [place] CSRS reviewed.  Chronic pain thought stemming from bone pain from known prostate cancer metastases.  He is receiving hydrocodone from both our office and [place]. I last filled #30 tablets on 12/08/2022. He takes 1 tab daily in am. He is now out of medication.  We previously discussed he should get this medication from one provider - I will provide 7d course until he gets in with [facility] oncology on Wednesday then [facility].       Relevant Medications   HYDROcodone-acetaminophen (NORCO/VICODIN) 5-325 MG tablet   Other Relevant Orders   Amb Referral to Palliative Care     Meds ordered this encounter  Medications   Ascorbic Acid (VITAMIN C) 500 MG CAPS    Sig: Take 1 Capful by mouth daily.   cyanocobalamin (VITAMIN B12) 1000 MCG tablet    Sig: Take 1 tablet (1,000 mcg total) by mouth daily.   HYDROcodone-acetaminophen (NORCO/VICODIN) 5-325  MG tablet    Sig: Take 1 tablet by mouth daily.    Dispense:  7 tablet    Refill:  0   Multiple Vitamins-Minerals (CENTRUM SILVER 50+MEN) TABS    Sig: Take 1 tablet by mouth daily.    Orders Placed This Encounter  Procedures   Amb Referral to Palliative Care    Referral Priority:   Routine    Referral Type:   Consultation    Number of Visits Requested:   1    Patient Instructions  Urinalysis today.  Add daughter Roanna Raider to designated party release (DPR) up front so we can speak with her if needed.  Stop lisinopril. Drop lasix dose to 20mg  daily. If leg swelling worsens may return to 40mg  daily.  Work on protein intake with each meal, consider boost or ensure supplement.  I have refilled hydrocodone  for you until you see cancer doctor on Wednesday then discuss pain regimen plan with them.  Drop temazepam to every other night.  Palliative care will reach out for evaluation at home. No driving until memory evaluation completed.   Call [facility] at [phone] to schedule appointment for memory evaluation.   Follow up plan: Return if symptoms worsen or fail to improve.  Eustaquio Boyden, MD

## 2023-01-16 NOTE — Patient Instructions (Addendum)
Urinalysis today.  Add daughter Roanna Raider to designated party release (DPR) up front so we can speak with her if needed.  Stop lisinopril. Drop lasix dose to 20mg  daily. If leg swelling worsens may return to 40mg  daily.  Work on protein intake with each meal, consider boost or ensure supplement.  I have refilled hydrocodone for you until you see cancer doctor on Wednesday then discuss pain regimen plan with them.  Drop temazepam to every other night.  Palliative care will reach out for evaluation at home. No driving until memory evaluation completed.   Call [facility] at [phone] to schedule appointment for memory evaluation.

## 2023-01-16 NOTE — Telephone Encounter (Signed)
Will discuss at OV today.

## 2023-01-17 ENCOUNTER — Encounter: Payer: Self-pay | Admitting: Family Medicine

## 2023-01-17 NOTE — Assessment & Plan Note (Addendum)
Ongoing fluctuating memory difficulty with behavioral changes including frequent periods of agitation/angry outbursts. More recently has gotten lost driving to [facility], ended up in [place]. Estranged from local wife Hope for the past year due to verbal threats. He lives alone in [place]. Daughter Investment banker, [employer]granddaughter ([address] [place]) aren't local [place], [place]). We have been trying to have him evaluated by neurology. Now daughter will work on scheduling appt. Recommend against driving due to safety concerns.  I will place palliative care referral - pt and daughter agree.  See below re positive RPR.  Caution with aricept in h/o GI bleed 2023.

## 2023-01-17 NOTE — Assessment & Plan Note (Signed)
Reassess off ACEI.

## 2023-01-17 NOTE — Assessment & Plan Note (Signed)
Upon previous discussion with ID Dr Rivka Safer - pt endorsed h/o primary syphilis at age 74s s/p shot of medicine for this. If that's the case, likely serofast.  Will refer to neurology for memory evaluation in abnormal MMSE and behavioral changes.

## 2023-01-17 NOTE — Assessment & Plan Note (Addendum)
Hospitalization for UGI bleed with melena 12/2021 due to acute gastric and duodenal ulceration - continue omeprazole 40mg  daily. Avoiding donepezil for this reason.

## 2023-01-17 NOTE — Assessment & Plan Note (Signed)
Ongoing. Suspect partly related low protein levels from malnutrition. Discussed nutrition status, suggested trial ensure/boost. Continue lasix, drop dose as per above.

## 2023-01-17 NOTE — Assessment & Plan Note (Signed)
[place] CSRS reviewed.  Chronic pain thought stemming from bone pain from known prostate cancer metastases.  He is receiving hydrocodone from both our office and [place]. I last filled #30 tablets on 12/08/2022. He takes 1 tab daily in am. He is now out of medication.  We previously discussed he should get this medication from one provider - I will provide 7d course until he gets in with [facility] oncology on Wednesday then [facility].

## 2023-01-17 NOTE — Assessment & Plan Note (Signed)
BP remains low despite recently stopping amlodipine.  Ongoing confusion over meds - daughter mentions he had multiple bottles of lisinopril at different doses.  Given ongoing hypotension and falls, CKD, will stop lisinopril and drop lasix to 20mg  daily. RTC 2 wks, reassess antihypertensive need off these medications.

## 2023-01-17 NOTE — Assessment & Plan Note (Signed)
Has established with [facility] oncology - next appt is next week. Continues Zytiga daily with prednisone 5mg  daily.

## 2023-01-17 NOTE — Assessment & Plan Note (Signed)
See above await neurology evaluation.

## 2023-01-17 NOTE — Assessment & Plan Note (Addendum)
He continues trazodone 50 mg and temazepam 15mg  nightly.  Discussed concern with benzo in setting of memory difficulty and recommend slow taper off - rec drop temazepam to 15mg  every other night dosing.

## 2023-01-20 ENCOUNTER — Encounter: Payer: Self-pay | Admitting: Family Medicine

## 2023-01-22 ENCOUNTER — Telehealth: Payer: Self-pay | Admitting: Family Medicine

## 2023-01-22 DIAGNOSIS — C61 Malignant neoplasm of prostate: Secondary | ICD-10-CM

## 2023-01-22 DIAGNOSIS — Z515 Encounter for palliative care: Secondary | ICD-10-CM | POA: Diagnosis not present

## 2023-01-22 DIAGNOSIS — E640 Sequelae of protein-calorie malnutrition: Secondary | ICD-10-CM | POA: Diagnosis not present

## 2023-01-22 DIAGNOSIS — R4189 Other symptoms and signs involving cognitive functions and awareness: Secondary | ICD-10-CM

## 2023-01-22 DIAGNOSIS — R634 Abnormal weight loss: Secondary | ICD-10-CM

## 2023-01-22 DIAGNOSIS — Z66 Do not resuscitate: Secondary | ICD-10-CM

## 2023-01-22 NOTE — Telephone Encounter (Signed)
Hospice referral placed.

## 2023-01-22 NOTE — Telephone Encounter (Signed)
[facility] called in and stated that they are needing an order for hospice. It can be faxed over to [phone] Attn: Referral Intake.

## 2023-01-22 NOTE — Addendum Note (Signed)
Addended by: Eustaquio Boyden on: 01/22/2023 07:33 PM   Modules accepted: Orders

## 2023-01-23 NOTE — Telephone Encounter (Signed)
Noted  

## 2023-01-31 ENCOUNTER — Encounter: Payer: Self-pay | Admitting: Family Medicine

## 2023-02-03 NOTE — Telephone Encounter (Signed)
Patient daughter Roanna Raider called in to follow up on this message. She did go ahead and cancel his appointment for tomorrow.

## 2023-02-04 ENCOUNTER — Ambulatory Visit: Payer: No Typology Code available for payment source | Admitting: Family Medicine

## 2023-02-04 ENCOUNTER — Encounter: Payer: Self-pay | Admitting: Family Medicine

## 2023-02-04 ENCOUNTER — Ambulatory Visit (INDEPENDENT_AMBULATORY_CARE_PROVIDER_SITE_OTHER): Payer: Medicare Other | Admitting: Family Medicine

## 2023-02-04 VITALS — BP 122/74 | HR 56 | Temp 98.0°F | Ht 66.0 in | Wt 161.2 lb

## 2023-02-04 DIAGNOSIS — F4325 Adjustment disorder with mixed disturbance of emotions and conduct: Secondary | ICD-10-CM

## 2023-02-04 DIAGNOSIS — R4189 Other symptoms and signs involving cognitive functions and awareness: Secondary | ICD-10-CM

## 2023-02-04 DIAGNOSIS — C7951 Secondary malignant neoplasm of bone: Secondary | ICD-10-CM

## 2023-02-04 DIAGNOSIS — R634 Abnormal weight loss: Secondary | ICD-10-CM

## 2023-02-04 DIAGNOSIS — R413 Other amnesia: Secondary | ICD-10-CM

## 2023-02-04 DIAGNOSIS — I10 Essential (primary) hypertension: Secondary | ICD-10-CM

## 2023-02-04 DIAGNOSIS — F5104 Psychophysiologic insomnia: Secondary | ICD-10-CM

## 2023-02-04 DIAGNOSIS — R4689 Other symptoms and signs involving appearance and behavior: Secondary | ICD-10-CM

## 2023-02-04 DIAGNOSIS — C61 Malignant neoplasm of prostate: Secondary | ICD-10-CM | POA: Diagnosis not present

## 2023-02-04 DIAGNOSIS — C799 Secondary malignant neoplasm of unspecified site: Secondary | ICD-10-CM

## 2023-02-04 MED ORDER — POTASSIUM CHLORIDE CRYS ER 10 MEQ PO TBCR: 10.0000 meq | EXTENDED_RELEASE_TABLET | Freq: Every day | ORAL | 3 refills | Status: DC

## 2023-02-04 MED ORDER — FUROSEMIDE 20 MG PO TABS: 20.0000 mg | ORAL_TABLET | Freq: Every day | ORAL | 3 refills | Status: DC

## 2023-02-04 NOTE — Progress Notes (Unsigned)
Ph: [phone] Fax: [phone]   Patient ID: Justin Ali, male    DOB: 02-Jul-1940, 82 y.o.   MRN: [id number]  This visit was conducted in person.  BP 122/74 (BP Location: Left Arm, Patient Position: Sitting, Cuff Size: Normal)   Pulse (!) 56   Temp 98 F (36.7 C) (Oral)   Ht 5\' 6"  (1.676 m)   Wt 161 lb 4 oz (73.1 kg)   SpO2 97%   BMI 26.03 kg/m    CC: upset over recent hospice evaluation  Subjective:   HPI: Justin Ali is a 82 y.o. male presenting on 02/04/2023 for Medical Management of Chronic Issues (3 Month f/u)   Recent palliative care evaluation at his place of residence [facility]) - was recommended hospice.  He presents today upset about hospice involvement due to "6 months to live" criteria.   He states he was told by [facility] oncologist to stop Zytiga prostate cancer medicine because "prostate cancer was cured", however he continues prednisone 5mg  daily.   [facility] available, he had imaging done on 99/16/2024 but subsequently he missed appt with provider on same day 01/12/2023. That is when he had confusional episode and ended up in [facility], police called and daughter had to drive there to pick him up.  Imaging as per below.   He states he's stopped hydrocodone as well as Restoril. Only taking trazodone for sleep.   Notes chronic watery diarrhea once daily, no formed stools.  Appetite is decreased.  He notes he stays physically active, walking daily, and feels well.      Relevant past medical, surgical, family and social history reviewed and updated as indicated. Interim medical history since our last visit reviewed. Allergies and medications reviewed and updated. Outpatient Medications Prior to Visit  Medication Sig Dispense Refill   CALCIUM PO Take 1,200 mg by mouth daily.     cholecalciferol (VITAMIN D) 1000 UNITS tablet Take 2,000 Units by mouth daily.     cyanocobalamin (VITAMIN B12) 1000 MCG tablet Take 1  tablet (1,000 mcg total) by mouth daily.     gabapentin (NEURONTIN) 300 MG capsule Take 1 capsule (300 mg total) by mouth at bedtime.     Omega-3 Fatty Acids (FISH OIL) 1000 MG CAPS Take 1 capsule (1,000 mg total) by mouth daily.     pantoprazole (PROTONIX) 40 MG tablet Take 40 mg by mouth daily.     predniSONE (DELTASONE) 5 MG tablet Take 1 tablet (5 mg total) by mouth daily with breakfast. 30 tablet 5   traZODone (DESYREL) 50 MG tablet Take 1 tablet (50 mg total) by mouth at bedtime.     abiraterone acetate (ZYTIGA) 250 MG tablet Take 4 tablets (1,000 mg total) by mouth daily. Take on an empty stomach 1 hour before or 2 hours after a meal 120 tablet 5   Ascorbic Acid (VITAMIN C) 500 MG CAPS Take 1 Capful by mouth daily.     furosemide (LASIX) 20 MG tablet Take 1 tablet (20 mg total) by mouth daily. For swelling     HYDROcodone-acetaminophen (NORCO/VICODIN) 5-325 MG tablet Take 1 tablet by mouth daily. 7 tablet 0   Multiple Vitamins-Minerals (CENTRUM SILVER 50+MEN) TABS Take 1 tablet by mouth daily.     potassium chloride SA (KLOR-CON M) 20 MEQ tablet Take 10 mEq by mouth.     temazepam (RESTORIL) 15 MG capsule Take 1 capsule (15 mg total) by mouth at bedtime as needed for sleep.  30 capsule 0   abiraterone acetate (ZYTIGA) 250 MG tablet Take 4 tablets (1,000 mg total) by mouth daily. Take on an empty stomach 1 hour before or 2 hours after a meal     polyethylene glycol powder (GLYCOLAX/MIRALAX) 17 GM/SCOOP powder Take 8.5 g by mouth daily as needed for moderate constipation.     clotrimazole (LOTRIMIN) 1 % cream Apply 1 application topically 2 (two) times daily. For 2 weeks or until full resolution (Patient not taking: Reported on 12/19/2022) 30 g 0   furosemide (LASIX) 20 MG tablet Take 1 tablet (20 mg total) by mouth daily as needed for edema.     polyethylene glycol powder (GLYCOLAX/MIRALAX) 17 GM/SCOOP powder Take 8.5 g by mouth daily as needed for moderate constipation. 3350 g 0   No  facility-administered medications prior to visit.     Per HPI unless specifically indicated in ROS section below Review of Systems  Objective:  BP 122/74 (BP Location: Left Arm, Patient Position: Sitting, Cuff Size: Normal)   Pulse (!) 56   Temp 98 F (36.7 C) (Oral)   Ht 5\' 6"  (1.676 m)   Wt 161 lb 4 oz (73.1 kg)   SpO2 97%   BMI 26.03 kg/m   Wt Readings from Last 3 Encounters:  02/04/23 161 lb 4 oz (73.1 kg)  01/16/23 159 lb (72.1 kg)  12/19/22 167 lb (75.8 kg)      Physical Exam Vitals and nursing note reviewed.  Constitutional:      Appearance: Normal appearance. He is not ill-appearing.  HENT:     Mouth/Throat:     Mouth: Mucous membranes are moist.     Pharynx: Oropharynx is clear. No oropharyngeal exudate or posterior oropharyngeal erythema.  Eyes:     Extraocular Movements: Extraocular movements intact.     Pupils: Pupils are equal, round, and reactive to light.  Cardiovascular:     Rate and Rhythm: Normal rate and regular rhythm.     Pulses: Normal pulses.     Heart sounds: Normal heart sounds. No murmur heard. Pulmonary:     Effort: Pulmonary effort is normal. No respiratory distress.     Breath sounds: Normal breath sounds. No wheezing, rhonchi or rales.  Musculoskeletal:     Right lower leg: No edema.     Left lower leg: No edema.  Skin:    General: Skin is warm and dry.     Findings: No rash.  Neurological:     Mental Status: He is alert.  Psychiatric:        Mood and Affect: Mood normal.        Behavior: Behavior normal.       [facility] IMAGING 01/12/2023: NM BONE SCAN WHOLE BODY Impression: Extensive abnormal radiotracer uptake throughout the axial and  proximal appendicular skeleton is consistent with known  widespread osseous metastatic disease, as seen on same-day CT  01/12/2023. Findings are age-indeterminate and may represent  partially treated disease. This exam will serve as a new baseline  for future comparison.  Electronically Signed By:  Tonna Corner Electronically Signed On: 01/12/2023 4:24 PM   CT CHEST/THORAX/ABD/PELVIS W CONTRAST Impression: Innumerable sclerotic lesions throughout the axial and proximal  appendicular skeleton, compatible with known osseous metastatic  disease. Please see report from same day nuclear medicine bone  scan for additional findings. No visceral metastases identified.  No lymphadenopathy.  I have reviewed the imaging examination and agree with the  findings and conclusions in this report.  Primary Interpreting Resident:  Andy Gauss Electronically Signed By: Vivi Barrack Electronically Signed On: 01/12/2023 3:01 PM   Assessment & Plan:  I spent 45 minutes caring for this patient today face to face, reviewing labs, prior records from [facility] facility, performing a medically appropriate examination and/or evaluation, counseling and educating the patient on metastatic prostate cancer and hospice/palliative care, documenting in the record and discussing case with daughter.   Problem List Items Addressed This Visit     HYPERTENSION, BENIGN ESSENTIAL    BP stable off medication. Anticipate no longer needs antidepressant due to significant weight loss over the past 6 months.  He does continue lasix 20mg  daily.       Relevant Medications   furosemide (LASIX) 20 MG tablet   Unintentional weight loss    19 lb weight loss over the past 6 months, pt attributes to poor appetite - doesn't like food options at [facility] senior apartment.       Primary malignant 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 (HCC) - Primary    H/o prostate cancer with mets to bones has been managed on abiraterone (Zytiga) 1000mg  daily + prednisone 5mg  daily for years. Previously followed by [facility] oncology Orlie Dakin) however was discharged from that practice due to episode of agitation. Subsequently established with [facility] oncology earlier this year.  He now states he was called by [facility] and told his prostate cancer was  cured and he didn't need any further Zytiga. I don't have records of this and am concerned about a misunderstanding - will request latest records from [place] oncology as reviewing records available in CareEverywhere I don't see any [facility] onc[facility] evaluation (although it seems pt was seen 01/21/2023), and latest VA imaging studies show persistence of extensive sclerotic lesions through skeleton.  Discussed this with patient - and concern over progression of metastatic prostate and return of bone pain. He agrees to restart Zytiga at least until next appt with [facility] onc (02/27/2023).  I called daughter Roanna Raider - states pt has been allowing hospice out to house, she also states at last [facility] appt oncologist had said she was ok with stopping Zytiga.       Relevant Medications   abiraterone acetate (ZYTIGA) 250 MG tablet   Chronic insomnia    He states he stopped Restoril (temazepam) and only is on trazodone 50mg  nihtly.       Metastasis from malignant neoplasm of prostate (HCC)   Relevant Medications   abiraterone acetate (ZYTIGA) 250 MG tablet   Adjustment disorder with mixed disturbance of emotions and conduct   Memory deficit    Pending neurology evaluation      Cognitive and behavioral changes    This complicates care.  I placed palliative care referral last visit. They came out to his apartment, with daughter and grand daughter present, and recommended hospice given metastatic prostate cancer diagnosis with memory impairment and weight loss over the past 6 months.  Pt initially agreed, however once he realized "6 month prognosis" he has become upset with all parties involved and no longer wants hospice services as he "feels well" - I will ask to cancel these.  Will continue recommending neurology evaluation.         Meds ordered this encounter  Medications   potassium chloride SA (KLOR-CON M) 10 MEQ tablet    Sig: Take 1 tablet (10 mEq total) by mouth daily.    Dispense:  90 tablet    Refill:  3    furosemide (LASIX) 20 MG  tablet    Sig: Take 1 tablet (20 mg total) by mouth daily. For leg swelling    Dispense:  90 tablet    Refill:  3    No orders of the defined types were placed in this encounter.   Patient Instructions  [facility] and need for [facility] medicine.  Ok to cancel hospice if you want Return in 3 months for follow up visit   Follow up plan: Return in about 3 months (around 05/07/2023), or if symptoms worsen or fail to improve, for follow up visit.  Eustaquio Boyden, MD

## 2023-02-04 NOTE — Patient Instructions (Addendum)
We will request latest note from cancer doctor at [facility] about your prostate cancer and need for [facility] medicine.  Ok to cancel hospice if you want Return in 3 months for follow up visit

## 2023-02-05 NOTE — Assessment & Plan Note (Signed)
19 lb weight loss over the past 6 months, pt attributes to poor appetite - doesn't like food options at [facility] senior apartment.

## 2023-02-05 NOTE — Telephone Encounter (Signed)
Spoke with daughter Roanna Raider.

## 2023-02-05 NOTE — Assessment & Plan Note (Signed)
Pending neurology evaluation.

## 2023-02-05 NOTE — Assessment & Plan Note (Addendum)
H/o prostate cancer with mets to bones has been managed on abiraterone (Zytiga) 1000mg  daily + prednisone 5mg  daily for years. Previously followed by [facility] oncology Orlie Dakin) however was discharged from that practice due to episode of agitation. Subsequently established with [facility] oncology earlier this year.  He now states he was called by [facility] and told his prostate cancer was cured and he didn't need any further Zytiga. I don't have records of this and am concerned about a misunderstanding - will request latest records from [place] oncology as reviewing records available in CareEverywhere I don't see any [facility] evaluation (although it seems pt was seen 01/21/2023), and latest [facility] imaging studies show persistence of extensive sclerotic lesions through skeleton.  Discussed this with patient - and concern over progression of metastatic prostate and return of bone pain. He agrees to restart Zytiga at least until next appt with [facility] onc (02/27/2023).  I called daughter Roanna Raider - states pt has been allowing hospice out to his apartment, she also states at last [facility] appt oncologist had said she was ok with stopping Zytiga.

## 2023-02-05 NOTE — Assessment & Plan Note (Addendum)
This complicates care.  I placed palliative care referral last visit. They came out to his apartment, with daughter and grand daughter present, and recommended hospice given metastatic prostate cancer diagnosis with memory impairment and weight loss over the past 6 months.  Pt initially agreed, however once he realized "6 month prognosis" he has become upset with all parties involved and no longer wants hospice services as he "feels well" - I will ask to cancel these.  Will continue recommending neurology evaluation.

## 2023-02-05 NOTE — Assessment & Plan Note (Signed)
He states he stopped Restoril (temazepam) and only is on trazodone 50mg  nihtly.

## 2023-02-05 NOTE — Assessment & Plan Note (Signed)
BP stable off medication. Anticipate no longer needs antidepressant due to significant weight loss over the past 6 months.  He does continue lasix 20mg  daily.

## 2023-02-10 ENCOUNTER — Encounter: Payer: Self-pay | Admitting: Oncology

## 2023-02-10 ENCOUNTER — Encounter: Payer: Self-pay | Admitting: Family Medicine

## 2023-02-11 ENCOUNTER — Inpatient Hospital Stay: Payer: Medicare Other | Attending: Oncology | Admitting: Licensed Clinical Social Worker

## 2023-02-11 NOTE — Progress Notes (Signed)
[facility] CSW Progress Note  Visual merchandiser contacted patient's daughter, Roanna Raider, per the request of Abelardo Diesel, Production designer, theatre/television/film,  She is currently working with an attorney regarding guardianship and wanted to inform the staff.  She expressed no other needs.     Dorothey Baseman, LCSW Clinical Social Worker [facility]

## 2023-02-12 ENCOUNTER — Telehealth: Payer: Self-pay | Admitting: Family Medicine

## 2023-02-12 ENCOUNTER — Telehealth: Payer: Self-pay | Admitting: *Deleted

## 2023-02-12 NOTE — Telephone Encounter (Signed)
Jonathon with [place] [employer] called reporting that there is an pen case for early dementia on Mr Justin Ali and they would like to have DMV evaluate patient for ability to drive but need an doctor signature to be able to ask for that evaluate from [facility]. He is asking if Dr Orlie Dakin woud sign this form for [facility] to evaluate patient. Pease advise

## 2023-02-12 NOTE — Telephone Encounter (Signed)
Patient daughter emailed over some notes from [place] For Dr. Reece Agar to review. Placed in his box up front. Thank you!

## 2023-02-12 NOTE — Telephone Encounter (Signed)
Placed notes in Dr. Timoteo Expose box.

## 2023-02-12 NOTE — Telephone Encounter (Signed)
Notified Christiane Ha that patient no longer being seen here and to please contact patient PCP or neurologist

## 2023-02-16 ENCOUNTER — Other Ambulatory Visit: Payer: Self-pay | Admitting: Family Medicine

## 2023-02-16 DIAGNOSIS — F5104 Psychophysiologic insomnia: Secondary | ICD-10-CM

## 2023-03-13 ENCOUNTER — Encounter: Payer: Self-pay | Admitting: Family Medicine

## 2023-03-13 ENCOUNTER — Ambulatory Visit (INDEPENDENT_AMBULATORY_CARE_PROVIDER_SITE_OTHER): Admitting: Family Medicine

## 2023-03-13 VITALS — BP 132/68 | HR 69 | Temp 98.8°F | Ht 66.0 in | Wt 164.5 lb

## 2023-03-13 DIAGNOSIS — R4689 Other symptoms and signs involving appearance and behavior: Secondary | ICD-10-CM

## 2023-03-13 DIAGNOSIS — F5104 Psychophysiologic insomnia: Secondary | ICD-10-CM

## 2023-03-13 DIAGNOSIS — R634 Abnormal weight loss: Secondary | ICD-10-CM

## 2023-03-13 DIAGNOSIS — C61 Malignant neoplasm of prostate: Secondary | ICD-10-CM

## 2023-03-13 DIAGNOSIS — C7951 Secondary malignant neoplasm of bone: Secondary | ICD-10-CM

## 2023-03-13 DIAGNOSIS — C799 Secondary malignant neoplasm of unspecified site: Secondary | ICD-10-CM | POA: Diagnosis not present

## 2023-03-13 DIAGNOSIS — Z8719 Personal history of other diseases of the digestive system: Secondary | ICD-10-CM

## 2023-03-13 DIAGNOSIS — R600 Localized edema: Secondary | ICD-10-CM | POA: Diagnosis not present

## 2023-03-13 DIAGNOSIS — F4325 Adjustment disorder with mixed disturbance of emotions and conduct: Secondary | ICD-10-CM

## 2023-03-13 DIAGNOSIS — R4189 Other symptoms and signs involving cognitive functions and awareness: Secondary | ICD-10-CM

## 2023-03-13 MED ORDER — OMEPRAZOLE 20 MG PO CPDR: 20.0000 mg | DELAYED_RELEASE_CAPSULE | Freq: Every day | ORAL | 2 refills | Status: DC

## 2023-03-13 NOTE — Progress Notes (Unsigned)
Ph: [phone] Fax: [phone]   Patient ID: [name], male    DOB: 1940/10/22, 82 y.o.   MRN: [id number]  This visit was conducted in person.  BP 132/68   Pulse 69   Temp 98.8 F (37.1 C) (Oral)   Ht 5\' 6"  (1.676 m)   Wt 164 lb 8 oz (74.6 kg)   SpO2 96%   BMI 26.55 kg/m    CC: 1 mo f/u visit  Subjective:   HPI: Justin Ali is a 82 y.o. male presenting on 03/13/2023 for Medical Management of Chronic Issues (Here for declining cognitive f/u.)   See prior notes for details.  Weight has stabilized.  Continues living at [facility].   He is upset that he states he now has someone watching him 24 hours a day. Driving has been revoked.   He is no longer taking gabapentin, prednisone, zytiga, pantoprazole or vitamins/supplements.   [facility].   I spoke with daughter - he is under protective care of DSS for 60 days.      Relevant past medical, surgical, family and social history reviewed and updated as indicated. Interim medical history since our last visit reviewed. Allergies and medications reviewed and updated. Outpatient Medications Prior to Visit  Medication Sig Dispense Refill   CALCIUM PO Take 1,200 mg by mouth daily.     cholecalciferol (VITAMIN D) 1000 UNITS tablet Take 2,000 Units by mouth daily.     cyanocobalamin (VITAMIN B12) 1000 MCG tablet Take 1 tablet (1,000 mcg total) by mouth daily.     furosemide (LASIX) 20 MG tablet Take 1 tablet (20 mg total) by mouth daily. For leg swelling 90 tablet 3   Omega-3 Fatty Acids (FISH OIL) 1000 MG CAPS Take 1 capsule (1,000 mg total) by mouth daily.     omeprazole (PRILOSEC) 20 MG capsule Take 20 mg by mouth daily.     polyethylene glycol powder (GLYCOLAX/MIRALAX) 17 GM/SCOOP powder Take 8.5 g by mouth daily as needed for moderate constipation.     potassium chloride SA (KLOR-CON M) 10 MEQ tablet Take 1 tablet (10 mEq total) by mouth daily. 90 tablet 3    QUEtiapine (SEROQUEL) 25 MG tablet Take 25 mg by mouth at bedtime.     abiraterone acetate (ZYTIGA) 250 MG tablet Take 4 tablets (1,000 mg total) by mouth daily. Take on an empty stomach 1 hour before or 2 hours after a meal (Patient not taking: Reported on 03/13/2023)     gabapentin (NEURONTIN) 300 MG capsule Take 1 capsule (300 mg total) by mouth at bedtime. (Patient not taking: Reported on 03/13/2023)     predniSONE (DELTASONE) 5 MG tablet Take 1 tablet (5 mg total) by mouth daily with breakfast. (Patient not taking: Reported on 03/13/2023) 30 tablet 5   traZODone (DESYREL) 50 MG tablet Take 1 tablet (50 mg total) by mouth at bedtime. (Patient not taking: Reported on 03/13/2023)     pantoprazole (PROTONIX) 40 MG tablet Take 40 mg by mouth daily.     No facility-administered medications prior to visit.     Per HPI unless specifically indicated in ROS section below Review of Systems  Objective:  BP 132/68   Pulse 69   Temp 98.8 F (37.1 C) (Oral)   Ht 5\' 6"  (1.676 m)   Wt 164 lb 8 oz (74.6 kg)   SpO2 96%   BMI 26.55 kg/m   Wt Readings from Last 3 Encounters:  03/13/23 164  lb 8 oz (74.6 kg)  02/04/23 161 lb 4 oz (73.1 kg)  01/16/23 159 lb (72.1 kg)      Physical Exam Vitals and nursing note reviewed.  Constitutional:      Appearance: Normal appearance. He is not ill-appearing.  HENT:     Head: Normocephalic and atraumatic.     Mouth/Throat:     Mouth: Mucous membranes are moist.     Pharynx: Oropharynx is clear. No oropharyngeal exudate or posterior oropharyngeal erythema.  Cardiovascular:     Rate and Rhythm: Normal rate and regular rhythm.     Pulses: Normal pulses.     Heart sounds: Normal heart sounds. No murmur heard. Pulmonary:     Effort: Pulmonary effort is normal. No respiratory distress.     Breath sounds: Normal breath sounds. No wheezing, rhonchi or rales.  Musculoskeletal:     Right lower leg: Edema (1+) present.     Left lower leg: Edema (tr) present.   Skin:    General: Skin is warm and dry.     Findings: No rash.  Neurological:     Mental Status: He is alert.  Psychiatric:        Mood and Affect: Mood normal.        Behavior: Behavior normal.       Results for orders placed or performed during the hospital encounter of 11/20/22  SARS Coronavirus 2 by RT PCR (hospital order, performed in [facility] hospital lab) *cepheid single result test* Anterior Nasal Swab   Specimen: Anterior Nasal Swab  Result Value Ref Range   SARS Coronavirus 2 by RT PCR NEGATIVE NEGATIVE  Basic metabolic panel  Result Value Ref Range   Sodium 143 135 - 145 mmol/L   Potassium 3.0 (L) 3.5 - 5.1 mmol/L   Chloride 104 98 - 111 mmol/L   CO2 31 22 - 32 mmol/L   Glucose, Bld 116 (H) 70 - 99 mg/dL   BUN 19 8 - 23 mg/dL   Creatinine, Ser 0.62 (H) 0.61 - 1.24 mg/dL   Calcium 9.2 8.9 - 69.4 mg/dL   GFR, Estimated 50 (L) >60 mL/min   Anion gap 8 5 - 15  CBC  Result Value Ref Range   WBC 5.3 4.0 - 10.5 K/uL   RBC 3.95 (L) 4.22 - 5.81 MIL/uL   Hemoglobin 12.3 (L) 13.0 - 17.0 g/dL   HCT 85.4 (L) 62.7 - 03.5 %   MCV 89.6 80.0 - 100.0 fL   MCH 31.1 26.0 - 34.0 pg   MCHC 34.7 30.0 - 36.0 g/dL   RDW 00.9 38.1 - 82.9 %   Platelets 177 150 - 400 K/uL   nRBC 0.0 0.0 - 0.2 %  Urinalysis, Routine w reflex microscopic -Urine, Clean Catch  Result Value Ref Range   Color, Urine YELLOW (A) YELLOW   APPearance CLEAR (A) CLEAR   Specific Gravity, Urine 1.008 1.005 - 1.030   pH 7.0 5.0 - 8.0   Glucose, UA NEGATIVE NEGATIVE mg/dL   Hgb urine dipstick NEGATIVE NEGATIVE   Bilirubin Urine NEGATIVE NEGATIVE   Ketones, ur NEGATIVE NEGATIVE mg/dL   Protein, ur NEGATIVE NEGATIVE mg/dL   Nitrite NEGATIVE NEGATIVE   Leukocytes,Ua NEGATIVE NEGATIVE    Assessment & Plan:   Problem List Items Addressed This Visit   None    No orders of the defined types were placed in this encounter.   No orders of the defined types were placed in this encounter.   There are  no  Patient Instructions on file for this visit.  Follow up plan: No follow-ups on file.  Justin Boyden, MD

## 2023-03-13 NOTE — Patient Instructions (Addendum)
Continue current medicines including quetiapine 25mg  nightly.  Keep follow up appointment in 2 months.  I will touch base with your daughter.

## 2023-03-16 NOTE — Assessment & Plan Note (Signed)
 This seems to have stabilized

## 2023-03-16 NOTE — Assessment & Plan Note (Signed)
Now off trazodone and gabapentin, on quetiapine 25mg  nightly.

## 2023-03-16 NOTE — Assessment & Plan Note (Addendum)
Ongoing cognitive difficulty which complicates care.  Latest MMSE 22/30, CDT 1/4 (07/2022).  He remains overall independent in ADLs, but does need assistance with several IADLs, no longer driving (status revoked).  He continues to distance himself from family, continues to focus on how he has been wronged by family which fuels anger, and continues to refuse neurology evaluation.  Difficult situation. I agree move closer to his daughter/granddaughter would be in best interest for him, it remains to be seen if he will accept this.

## 2023-03-16 NOTE — Telephone Encounter (Signed)
Noted. Addressed in OV.

## 2023-03-16 NOTE — Assessment & Plan Note (Signed)
H/o UGI bleed with melena on hospitalization 12/2021 due to acute gastric ulcer, duodenal ulceration. 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  Continue omeprazole 20mg  daily.

## 2023-03-16 NOTE — Assessment & Plan Note (Signed)
Previously on restoril, gabapentin, trazodone. He is now only on quetiapine 25mg  nightly and notes adequate sleep.

## 2023-03-16 NOTE — Assessment & Plan Note (Signed)
Managed with lasix 20mg  daily + potassium daily.

## 2023-03-16 NOTE — Assessment & Plan Note (Signed)
H/o prostate cancer with mets to bones (innumerable sclerotic lesions to axial and appendicular skeleton on CT 12/2022).  Previously on Zytiga + prednisone 5mg  daily. Per latest oncology note which I reviewed (records provided by daughter), oncologist Dr Renato Gails thought reasonable to go off these meds due to good control to date however pt had decided to continue taking.  Today pt does not have these bottles with him so I assume he has stopped.

## 2023-03-16 NOTE — Telephone Encounter (Signed)
Printed order and placed in Dr. Timoteo Expose box.

## 2023-03-19 NOTE — Telephone Encounter (Signed)
Discussed at latest OV

## 2023-03-22 ENCOUNTER — Encounter: Payer: Self-pay | Admitting: Family Medicine

## 2023-04-23 ENCOUNTER — Encounter: Payer: Self-pay | Admitting: Oncology

## 2023-05-04 ENCOUNTER — Encounter: Payer: Self-pay | Admitting: Family Medicine

## 2023-05-08 ENCOUNTER — Ambulatory Visit: Payer: Medicare Other | Admitting: Family Medicine

## 2023-05-11 ENCOUNTER — Encounter: Payer: Self-pay | Admitting: Family Medicine

## 2023-05-19 ENCOUNTER — Encounter: Payer: Self-pay | Admitting: Family Medicine

## 2023-05-19 ENCOUNTER — Other Ambulatory Visit: Payer: Self-pay

## 2023-05-19 ENCOUNTER — Inpatient Hospital Stay
Admission: EM | Admit: 2023-05-19 | Discharge: 2023-06-03 | DRG: 640 | Disposition: A | Discharge status: Skilled Nursing Facility | Payer: No Typology Code available for payment source | Attending: Osteopathic Medicine | Admitting: Osteopathic Medicine

## 2023-05-19 ENCOUNTER — Inpatient Hospital Stay: Admit: 2023-05-19 | Payer: No Typology Code available for payment source

## 2023-05-19 ENCOUNTER — Emergency Department: Payer: No Typology Code available for payment source

## 2023-05-19 DIAGNOSIS — F0390 Unspecified dementia without behavioral disturbance: Secondary | ICD-10-CM | POA: Diagnosis present

## 2023-05-19 DIAGNOSIS — Z1152 Encounter for screening for COVID-19: Secondary | ICD-10-CM | POA: Diagnosis not present

## 2023-05-19 DIAGNOSIS — E870 Hyperosmolality and hypernatremia: Secondary | ICD-10-CM | POA: Diagnosis not present

## 2023-05-19 DIAGNOSIS — R52 Pain, unspecified: Secondary | ICD-10-CM | POA: Diagnosis not present

## 2023-05-19 DIAGNOSIS — E860 Dehydration: Principal | ICD-10-CM | POA: Diagnosis present

## 2023-05-19 DIAGNOSIS — I6523 Occlusion and stenosis of bilateral carotid arteries: Secondary | ICD-10-CM | POA: Diagnosis present

## 2023-05-19 DIAGNOSIS — G2581 Restless legs syndrome: Secondary | ICD-10-CM | POA: Diagnosis present

## 2023-05-19 DIAGNOSIS — F05 Delirium due to known physiological condition: Secondary | ICD-10-CM | POA: Diagnosis not present

## 2023-05-19 DIAGNOSIS — N1831 Chronic kidney disease, stage 3a: Secondary | ICD-10-CM | POA: Diagnosis not present

## 2023-05-19 DIAGNOSIS — R55 Syncope and collapse: Principal | ICD-10-CM | POA: Diagnosis present

## 2023-05-19 DIAGNOSIS — K219 Gastro-esophageal reflux disease without esophagitis: Secondary | ICD-10-CM | POA: Diagnosis not present

## 2023-05-19 DIAGNOSIS — Z66 Do not resuscitate: Secondary | ICD-10-CM | POA: Diagnosis present

## 2023-05-19 DIAGNOSIS — G9340 Encephalopathy, unspecified: Secondary | ICD-10-CM

## 2023-05-19 DIAGNOSIS — K111 Hypertrophy of salivary gland: Secondary | ICD-10-CM | POA: Diagnosis present

## 2023-05-19 DIAGNOSIS — I10 Essential (primary) hypertension: Secondary | ICD-10-CM | POA: Diagnosis not present

## 2023-05-19 DIAGNOSIS — K1120 Sialoadenitis, unspecified: Secondary | ICD-10-CM | POA: Diagnosis present

## 2023-05-19 DIAGNOSIS — G4733 Obstructive sleep apnea (adult) (pediatric): Secondary | ICD-10-CM | POA: Diagnosis present

## 2023-05-19 DIAGNOSIS — C61 Malignant neoplasm of prostate: Secondary | ICD-10-CM | POA: Diagnosis present

## 2023-05-19 DIAGNOSIS — I951 Orthostatic hypotension: Principal | ICD-10-CM | POA: Diagnosis present

## 2023-05-19 DIAGNOSIS — Z9185 Personal history of military service: Secondary | ICD-10-CM

## 2023-05-19 DIAGNOSIS — R636 Underweight: Secondary | ICD-10-CM | POA: Diagnosis present

## 2023-05-19 DIAGNOSIS — E876 Hypokalemia: Secondary | ICD-10-CM | POA: Diagnosis not present

## 2023-05-19 DIAGNOSIS — E871 Hypo-osmolality and hyponatremia: Secondary | ICD-10-CM | POA: Diagnosis present

## 2023-05-19 DIAGNOSIS — K760 Fatty (change of) liver, not elsewhere classified: Secondary | ICD-10-CM | POA: Diagnosis present

## 2023-05-19 DIAGNOSIS — Z741 Need for assistance with personal care: Secondary | ICD-10-CM | POA: Diagnosis not present

## 2023-05-19 DIAGNOSIS — R4701 Aphasia: Secondary | ICD-10-CM | POA: Diagnosis present

## 2023-05-19 DIAGNOSIS — N179 Acute kidney failure, unspecified: Secondary | ICD-10-CM | POA: Diagnosis not present

## 2023-05-19 DIAGNOSIS — C7951 Secondary malignant neoplasm of bone: Secondary | ICD-10-CM | POA: Diagnosis present

## 2023-05-19 DIAGNOSIS — E569 Vitamin deficiency, unspecified: Secondary | ICD-10-CM | POA: Diagnosis not present

## 2023-05-19 DIAGNOSIS — Z8249 Family history of ischemic heart disease and other diseases of the circulatory system: Secondary | ICD-10-CM

## 2023-05-19 DIAGNOSIS — I129 Hypertensive chronic kidney disease with stage 1 through stage 4 chronic kidney disease, or unspecified chronic kidney disease: Secondary | ICD-10-CM | POA: Diagnosis present

## 2023-05-19 DIAGNOSIS — I2510 Atherosclerotic heart disease of native coronary artery without angina pectoris: Secondary | ICD-10-CM | POA: Diagnosis present

## 2023-05-19 DIAGNOSIS — Z87891 Personal history of nicotine dependence: Secondary | ICD-10-CM

## 2023-05-19 DIAGNOSIS — Z85828 Personal history of other malignant neoplasm of skin: Secondary | ICD-10-CM

## 2023-05-19 DIAGNOSIS — R2681 Unsteadiness on feet: Secondary | ICD-10-CM | POA: Diagnosis not present

## 2023-05-19 DIAGNOSIS — E785 Hyperlipidemia, unspecified: Secondary | ICD-10-CM | POA: Diagnosis present

## 2023-05-19 DIAGNOSIS — G4700 Insomnia, unspecified: Secondary | ICD-10-CM | POA: Diagnosis not present

## 2023-05-19 DIAGNOSIS — G9341 Metabolic encephalopathy: Secondary | ICD-10-CM | POA: Diagnosis present

## 2023-05-19 DIAGNOSIS — R001 Bradycardia, unspecified: Secondary | ICD-10-CM | POA: Diagnosis not present

## 2023-05-19 DIAGNOSIS — E878 Other disorders of electrolyte and fluid balance, not elsewhere classified: Secondary | ICD-10-CM | POA: Diagnosis present

## 2023-05-19 DIAGNOSIS — Z6825 Body mass index (BMI) 25.0-25.9, adult: Secondary | ICD-10-CM

## 2023-05-19 DIAGNOSIS — M6259 Muscle wasting and atrophy, not elsewhere classified, multiple sites: Secondary | ICD-10-CM | POA: Diagnosis not present

## 2023-05-19 DIAGNOSIS — N189 Chronic kidney disease, unspecified: Secondary | ICD-10-CM | POA: Diagnosis not present

## 2023-05-19 DIAGNOSIS — N390 Urinary tract infection, site not specified: Secondary | ICD-10-CM | POA: Diagnosis not present

## 2023-05-19 DIAGNOSIS — Z79899 Other long term (current) drug therapy: Secondary | ICD-10-CM

## 2023-05-19 DIAGNOSIS — Z635 Disruption of family by separation and divorce: Secondary | ICD-10-CM

## 2023-05-19 LAB — BASIC METABOLIC PANEL
Anion gap: 11 (ref 5–15)
BUN: 25 mg/dL — ABNORMAL HIGH (ref 8–23)
CO2: 31 mmol/L (ref 22–32)
Calcium: 12.6 mg/dL — ABNORMAL HIGH (ref 8.9–10.3)
Chloride: 102 mmol/L (ref 98–111)
Creatinine, Ser: 2.65 mg/dL — ABNORMAL HIGH (ref 0.61–1.24)
GFR, Estimated: 23 mL/min — ABNORMAL LOW (ref >60)
Glucose, Bld: 127 mg/dL — ABNORMAL HIGH (ref 70–99)
Potassium: 2.9 mmol/L — ABNORMAL LOW (ref 3.5–5.1)
Sodium: 144 mmol/L (ref 135–145)

## 2023-05-19 LAB — CBC WITH DIFFERENTIAL/PLATELET
Abs Immature Granulocytes: 0.02 10*3/uL (ref 0.00–0.07)
Basophils Absolute: 0 10*3/uL (ref 0.0–0.1)
Basophils Relative: 0 %
Eosinophils Absolute: 0.2 10*3/uL (ref 0.0–0.5)
Eosinophils Relative: 3 %
HCT: 33.7 % — ABNORMAL LOW (ref 39.0–52.0)
Hemoglobin: 11.3 g/dL — ABNORMAL LOW (ref 13.0–17.0)
Immature Granulocytes: 0 %
Lymphocytes Relative: 23 %
Lymphs Abs: 1.2 10*3/uL (ref 0.7–4.0)
MCH: 30.8 pg (ref 26.0–34.0)
MCHC: 33.5 g/dL (ref 30.0–36.0)
MCV: 91.8 fL (ref 80.0–100.0)
Monocytes Absolute: 0.3 10*3/uL (ref 0.1–1.0)
Monocytes Relative: 6 %
Neutro Abs: 3.5 10*3/uL (ref 1.7–7.7)
Neutrophils Relative %: 68 %
Platelets: 149 10*3/uL — ABNORMAL LOW (ref 150–400)
RBC: 3.67 MIL/uL — ABNORMAL LOW (ref 4.22–5.81)
RDW: 12.9 % (ref 11.5–15.5)
WBC: 5.2 10*3/uL (ref 4.0–10.5)
nRBC: 0 % (ref 0.0–0.2)

## 2023-05-19 LAB — RESP PANEL BY RT-PCR (RSV, FLU A&B, COVID)  RVPGX2
Influenza A by PCR: NEGATIVE
Influenza B by PCR: NEGATIVE
Resp Syncytial Virus by PCR: NEGATIVE
SARS Coronavirus 2 by RT PCR: NEGATIVE

## 2023-05-19 LAB — TROPONIN I (HIGH SENSITIVITY): Troponin I (High Sensitivity): 19 ng/L — ABNORMAL HIGH (ref <18)

## 2023-05-19 MED ORDER — HALOPERIDOL LACTATE 5 MG/ML IJ SOLN
2.0000 mg | Freq: Once | INTRAMUSCULAR | Status: AC
  Administered 2023-05-19: 2 mg via INTRAVENOUS
  Filled 2023-05-19: qty 1

## 2023-05-19 MED ORDER — SODIUM CHLORIDE 0.9% FLUSH
3.0000 mL | Freq: Two times a day (BID) | INTRAVENOUS | Status: DC
  Administered 2023-05-20 – 2023-06-03 (×23): 3 mL via INTRAVENOUS

## 2023-05-19 MED ORDER — POTASSIUM CHLORIDE CRYS ER 20 MEQ PO TBCR
40.0000 meq | EXTENDED_RELEASE_TABLET | Freq: Once | ORAL | Status: AC
  Administered 2023-05-19: 40 meq via ORAL
  Filled 2023-05-19: qty 2

## 2023-05-19 MED ORDER — ENOXAPARIN SODIUM 30 MG/0.3ML IJ SOSY
30.0000 mg | PREFILLED_SYRINGE | INTRAMUSCULAR | Status: DC
  Administered 2023-05-20 – 2023-05-31 (×12): 30 mg via SUBCUTANEOUS
  Filled 2023-05-19 (×12): qty 0.3

## 2023-05-19 MED ORDER — SODIUM CHLORIDE 0.9 % IV BOLUS
1000.0000 mL | Freq: Once | INTRAVENOUS | Status: AC
  Administered 2023-05-19: 1000 mL via INTRAVENOUS

## 2023-05-19 MED ORDER — SODIUM CHLORIDE 0.9 % IV SOLN: INTRAVENOUS | Status: DC

## 2023-05-19 MED ORDER — ONDANSETRON HCL 4 MG/2ML IJ SOLN
4.0000 mg | Freq: Four times a day (QID) | INTRAMUSCULAR | Status: DC | PRN
  Administered 2023-05-22 – 2023-06-01 (×2): 4 mg via INTRAVENOUS
  Filled 2023-05-19 (×2): qty 2

## 2023-05-19 MED ORDER — ONDANSETRON HCL 4 MG PO TABS: 4.0000 mg | ORAL_TABLET | Freq: Four times a day (QID) | ORAL | Status: DC | PRN

## 2023-05-19 NOTE — ED Notes (Signed)
Second troponin sent to lab, informed provider needs order if they want it run.

## 2023-05-19 NOTE — Assessment & Plan Note (Signed)
Baseline stage 4 prostate cancer w/ diffuse metastasis

## 2023-05-19 NOTE — ED Notes (Signed)
Justin Ali (daughter) [phone]

## 2023-05-19 NOTE — ED Notes (Signed)
Son informed me that he can become combative. Right after son left this RN heard the bed alarm going off and entered the room. Pt informed me that he needed to pee. Assisted him with use of the urinal. He then told me that we hadn't done anything for him and threatened to throw his urinal in my face. He took off all of his monitoring equipment. Successfully assisted patient back to bed and messaged charge RN and covering provider.

## 2023-05-19 NOTE — ED Notes (Signed)
Dr. Goodman at bedside.  

## 2023-05-19 NOTE — Progress Notes (Signed)
PHARMACIST - PHYSICIAN COMMUNICATION  CONCERNING:  Enoxaparin (Lovenox) for DVT Prophylaxis    RECOMMENDATION: Patient was prescribed enoxaprin 40mg  q24 hours for VTE prophylaxis.   Filed Weights   05/19/23 0911  Weight: 74 kg (163 lb 2.3 oz)    Body mass index is 26.33 kg/m.  Estimated Creatinine Clearance: 19.7 mL/min (A) (by C-G formula based on SCr of 2.65 mg/dL (H)).  Patient is candidate for enoxaparin 30mg  every 24 hours based on CrCl <44ml/min or Weight <45kg  DESCRIPTION: Pharmacy has adjusted enoxaparin dose per [facility] policy.  Patient is now receiving enoxaparin 30 mg every 24 hours    Barrie Folk, PharmD Clinical Pharmacist  05/19/2023 11:27 AM

## 2023-05-19 NOTE — H&P (Addendum)
History and Physical    Patient: Justin Ali ZOX:[id number] DOB: 1940/09/02 DOA: 05/19/2023 DOS: the patient was seen and examined on 05/19/2023 PCP: Eustaquio Boyden, MD  Patient coming from: Home  Chief Complaint:  Chief Complaint  Patient presents with   Loss of Consciousness   Bradycardia   HPI: Justin Ali is a 83 y.o. 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  History probably from staff in the setting of encephalopathy/dementia.  Patient with noted syncopal event at nursing facility.  Was found at dining table slumped over.  Blood sugar 156, blood pressure 90s over 50s.  Satting 80% on room air.  No reported fevers or chills.  No reported nausea or vomiting.  Patient denies any chest pain.  No reported nausea or vomiting.  No reported diarrhea. Presented to the ER afebrile, heart rate 40s to 80s, BP stable.  Satting well on room air.  White count 5.2, hemoglobin 9.3, platelets 149, COVID flu and RSV negative.  Creatinine 2.65. CXR stable apart from metastatic prostate disease.  Review of Systems: As mentioned in the history of present illness. All other systems reviewed and are negative. 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   2 mm - non-bleeding 03/18/2013 colonoscopy    CAD (coronary artery disease), native coronary artery 2014   By CT lung (04/2013)    Cataract    Gastric ulcer    HLD (hyperlipidemia)    HTN (hypertension)    Hyperglycemia    NAFLD (nonalcoholic fatty liver disease) 81/1914   by abd [place] with increased LFTs   OSA (obstructive sleep apnea)    did not tolerate CPAP   Other benign neoplasm of connective and other soft tissue of unspecified site    Neurofibroma of lateral periorbital area   Peripheral neuropathy    Personal history of colonic adenomas 08/05/2012   Pneumonia 01/2013   CAP LUL    Restless legs syndrome (RLS)    Sleep apnea    no cpap   Past Surgical History:  Procedure Laterality Date   COLONOSCOPY  07/2012   5 adenomas (tubular, TV, serrated), rec rpt 5 months [name])   COLONOSCOPY  02/2013   residual adenomas, cecal AVM, rec rpt 1 yr [name])   COLONOSCOPY  03/2014   no residual polyp, cecal AVM, rpt 3-4 yrs [name])   ECTROPION REPAIR Bilateral 09/29/2017   Procedure: REPAIR OF ECTROPION, EXTENSIVE UPPER AND LOWER;  Surgeon: Imagene Riches, MD;  Location: [facility];  Service: Ophthalmology;  Laterality: Bilateral;   ESOPHAGOGASTRODUODENOSCOPY (EGD) WITH PROPOFOL N/A 01/02/2022   normal esophagus, erosive gastropathy without recent bleed, nonbleeding gastric ulcers and duodenal ulcer with flat pigmented spotWohl, [name], MD)   ORIF ANKLE FRACTURE  11/29/2000   Dr. Katrinka Blazing   PTOSIS REPAIR Bilateral 09/29/2017   Procedure: BLEPHAROPTOSIS REPAIR RESECT EX UPPER;  Surgeon: Imagene Riches, MD;  Location: [facility];  Service: Ophthalmology;  Laterality: Bilateral;   RECONSTRUCTION OF EYELID Bilateral 05/31/2018   SKIN CANCER EXCISION  1997   left anterior neck   Social History:  reports that he quit smoking about 21 years ago. His smoking use included cigarettes and cigars. He has never used smokeless tobacco. He reports that he does not currently use alcohol. He reports that he does not use drugs.  No Known Allergies  Family History  Problem Relation Age of Onset  Cancer Father        liver   Alcohol abuse Father    Liver cancer Father    Cancer Mother        breast   Breast cancer Mother    Hypertension Brother        Half-brother   Coronary artery disease Paternal Aunt        CABG   Cancer Other        GM; Aunt - breast   Colon cancer Neg Hx    Rectal cancer Neg Hx    Stomach cancer Neg Hx    Esophageal cancer Neg Hx     Prior to Admission medications   Medication Sig Start Date End Date Taking? Authorizing Provider   abiraterone acetate (ZYTIGA) 250 MG tablet Take 1,000 mg by mouth every other day. Take on an empty stomach 1 hour before or 2 hours after a meal 02/05/23  Yes Eustaquio Boyden, MD  Cholecalciferol (VITAMIN D3) 50 MCG (2000 UT) CAPS Take 2,000 Units by mouth every other day.   Yes [provider]  furosemide (LASIX) 20 MG tablet Take 1 tablet (20 mg total) by mouth daily. For leg swelling 02/04/23  Yes Eustaquio Boyden, MD  melatonin 5 MG TABS Take 10 mg by mouth at bedtime.   Yes [provider]  Omega-3 Fatty Acids (FISH OIL) 1000 MG CAPS Take 1,000 mg by mouth daily.   Yes [provider]  omeprazole (PRILOSEC) 20 MG capsule Take 1 capsule (20 mg total) by mouth daily. 03/13/23  Yes Eustaquio Boyden, MD  polyethylene glycol powder [facility]) 17 GM/SCOOP powder Take 8.5 g by mouth daily as needed for moderate constipation. 02/04/23  Yes Eustaquio Boyden, MD  potassium chloride SA (KLOR-CON M) 10 MEQ tablet Take 1 tablet (10 mEq total) by mouth daily. 02/04/23  Yes Eustaquio Boyden, MD  QUEtiapine (SEROQUEL) 25 MG tablet Take 25 mg by mouth at bedtime. 03/02/23  Yes [provider]    Physical Exam: Vitals:   05/19/23 0935 05/19/23 1000 05/19/23 1025 05/19/23 1030  BP:  (!) 148/64  (!) 142/69  Pulse:   (!) 48 (!) 41  Resp:  10 14 11   Temp:      TempSrc:      SpO2: 90%  99% 99%  Weight:      Height:       Physical Exam Constitutional:      Comments: Underweight, disshevled    HENT:     Head: Normocephalic.     Nose: Nose normal.     Mouth/Throat:     Mouth: Mucous membranes are dry.  Eyes:     Pupils: Pupils are equal, round, and reactive to light.  Cardiovascular:     Rate and Rhythm: Normal rate and regular rhythm.  Pulmonary:     Effort: Pulmonary effort is normal.  Abdominal:     General: Bowel sounds are normal.  Musculoskeletal:     Comments: + generalized weakness    Skin:    General: Skin is dry.  Neurological:      General: No focal deficit present.  Psychiatric:        Mood and Affect: Mood normal.     Data Reviewed:  There are no new results to review at this time.  DG Chest Portable 1 View CLINICAL DATA:  Hypoxia with syncopal episode. History of metastatic prostate cancer.  EXAM: PORTABLE CHEST 1 VIEW  COMPARISON:  Radiographs 11/20/2022 and 04/25/2013. Chest CT 09/16/2021.  FINDINGS: 0954 hours. The patient's mandible overlies the thoracic inlet. The heart size and mediastinal contours are stable with aortic atherosclerosis. There are low lung volumes without evidence of confluent airspace disease, pleural effusion or pneumothorax. Widespread sclerotic osseous metastatic disease is again noted without evidence of pathologic fracture. Telemetry leads overlie the chest.  IMPRESSION: 1. Low lung volumes without evidence of acute cardiopulmonary process. 2. Widespread sclerotic osseous metastatic disease.  Electronically Signed   By: Carey Bullocks M.D.   On: 05/19/2023 10:44  Lab Results  Component Value Date   WBC 5.2 05/19/2023   HGB 11.3 (L) 05/19/2023   HCT 33.7 (L) 05/19/2023   MCV 91.8 05/19/2023   PLT 149 (L) 05/19/2023   Last metabolic panel Lab Results  Component Value Date   GLUCOSE 127 (H) 05/19/2023   NA 144 05/19/2023   K 2.9 (L) 05/19/2023   CL 102 05/19/2023   CO2 31 05/19/2023   BUN 25 (H) 05/19/2023   CREATININE 2.65 (H) 05/19/2023   GFRNONAA 23 (L) 05/19/2023   CALCIUM 12.6 (H) 05/19/2023   PHOS 4.5 08/15/2022   PROT 6.4 10/10/2022   ALBUMIN 4.2 10/10/2022   BILITOT 0.5 10/10/2022   ALKPHOS 51 10/10/2022   AST 13 10/10/2022   ALT 8 10/10/2022   ANIONGAP 11 05/19/2023     Assessment and Plan: * Syncope + brief syncopal episoded at facility  Appears clinically dry  Noted bradycardia (chronic) Will place on MIVF  Check 2D ECHO  Check orthostatics as appropriate  Consider cardiology consultation if bradycardia worsens    Sinus  bradycardia HR in 50s  Looks to be chronic in review of EKGs x 1 year  Likely confounder in setting of syncopal event  2D ECHO pending  Monitor  Cardiology consultation as clinically indicated.   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 stage dementia  CT head WNL  No overt signs of infection  Monitor   Chronic kidney disease, stage 3a (HCC) Baseline cr 1-1.4 w/ GFR in the 40s Cr 2.65 today w/ GFR in the 20s  Clinically dry today in setting syncopal event  IVF hydration  Hold nephrotoxic agents  Check FeNa Monitor    Primary malignant 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 (HCC) Baseline stage 4 prostate cancer w/ diffuse metastasis        Advance Care Planning:   Code Status: Full Code   Consults: None   Family Communication: CNA at the bedside   Severity of Illness: The appropriate patient status for this patient is INPATIENT. Inpatient status is judged to be reasonable and necessary in order to provide the required intensity of service to ensure the patient's safety. The patient's presenting symptoms, physical exam findings, and initial radiographic and laboratory data in the context of their chronic comorbidities is felt to place them at high risk for further clinical deterioration. Furthermore, it is not anticipated that the patient will be medically stable for discharge from the hospital within 2 midnights of admission.   * I certify that at the point of admission it is my clinical judgment that the patient will require inpatient hospital care spanning beyond 2 midnights from the point of admission due to high intensity of service, high risk for further deterioration and high frequency of surveillance required.*  Author: Floydene Flock, MD 05/19/2023 12:24 PM  For on call review [email or web address].

## 2023-05-19 NOTE — ED Provider Notes (Signed)
[facility] Provider Note    Event Date/Time   First MD Initiated Contact with Patient 05/19/23 [phone]     (approximate)   History   Syncope  HPI  Justin Ali is a 83 y.o. male who presents to the emergency department today because of concerns for a syncopal episode.  The patient does not recall what happened this morning.  Apparently he passed out over the dining room table.  Caregiver who is at bedside has not known for him to pass out in the past.  She states that she saw him yesterday evening and there is send was normal state of health.  The patient denies any chest pain or palpitations.  Denies any recent illness or fevers.     Physical Exam   Triage Vital Signs: ED Triage Vitals  Encounter Vitals Group     BP 05/19/23 0912 123/69     Systolic BP Percentile --      Diastolic BP Percentile --      Pulse Rate 05/19/23 0912 (!) 42     Resp 05/19/23 0912 20     Temp 05/19/23 0933 97.9 F (36.6 C)     Temp Source 05/19/23 0933 Oral     SpO2 05/19/23 0912 92 %     Weight 05/19/23 0911 163 lb 2.3 oz (74 kg)     Height 05/19/23 0911 5\' 6"  (1.676 m)     Head Circumference --      Peak Flow --      Pain Score 05/19/23 0911 0     Pain Loc --      Pain Education --      Exclude from Growth Chart --     Most recent vital signs: Vitals:   05/19/23 0933 05/19/23 0935  BP:    Pulse:    Resp:    Temp: 97.9 F (36.6 C)   SpO2: (!) 88% 90%   General: Awake, alert. CV:  Good peripheral perfusion. Bradycardia. Resp:  Normal effort. Lungs clear. Abd:  No distention.     ED Results / Procedures / Treatments   Labs (all labs ordered are listed, but only abnormal results are displayed) Labs Reviewed  CBC WITH DIFFERENTIAL/PLATELET - Abnormal; Notable for the following components:      Result Value   RBC 3.67 (*)    Hemoglobin 11.3 (*)    HCT 33.7 (*)    Platelets 149 (*)    All other components within normal limits  BASIC METABOLIC  PANEL - Abnormal; Notable for the following components:   Potassium 2.9 (*)    Glucose, Bld 127 (*)    BUN 25 (*)    Creatinine, Ser 2.65 (*)    Calcium 12.6 (*)    GFR, Estimated 23 (*)    All other components within normal limits  TROPONIN I (HIGH SENSITIVITY) - Abnormal; Notable for the following components:   Troponin I (High Sensitivity) 19 (*)    All other components within normal limits  RESP PANEL BY RT-PCR (RSV, FLU A&B, COVID)  [id number]     EKG  I, Phineas Semen, attending physician, personally viewed and interpreted this EKG  EKG Time: 0918 Rate: 46 Rhythm: sinus bradycardia with PVC Axis: normal Intervals: qtc 372 QRS: narrow, q waves v1 ST changes: no st elevation Impression: abnormal ekg   RADIOLOGY I independently interpreted and visualized the CXR. My interpretation: No pneumonia Radiology interpretation:  IMPRESSION:  1. Low lung volumes without evidence  of acute cardiopulmonary  process.  2. Widespread sclerotic osseous metastatic disease.      PROCEDURES:  Critical Care performed: No    MEDICATIONS ORDERED IN ED: Medications - No data to display   IMPRESSION / MDM / ASSESSMENT AND PLAN / ED COURSE  I reviewed the triage vital signs and the nursing notes.                              Differential diagnosis includes, but is not limited to, anemia, arrythmia, electrolyte abnormality, ACS  Patient's presentation is most consistent with acute presentation with potential threat to life or bodily function.   The patient is on the cardiac monitor to evaluate for evidence of arrhythmia and/or significant heart rate changes.  Patient presented to the emergency department today because of concern for syncope episode. Patient has no complaints. Afebrile. Initially hypoxic but unclear if it was an erroneous reading. He was able to be weaned off of oxygen here in the emergency department. Blood work concerning for [employer] and dehydration. EKG with sinus  bradycardia, per chart review of previous EKG patient with sinus bradycardia in the past. CXR without pneumonia, does show known bone metastasis. Will start IVFs. Discussed with Dr. Alvester Morin with the hospitalist service who will evaluate for admission.      FINAL CLINICAL IMPRESSION(S) / ED DIAGNOSES   Final diagnoses:  Syncope, unspecified syncope type  AKI (acute kidney injury) (HCC)       Note:  This document was prepared using Dragon voice recognition software and may include unintentional dictation errors.    Phineas Semen, MD 05/19/23 [phone]

## 2023-05-19 NOTE — Assessment & Plan Note (Signed)
Baseline cr 1-1.4 w/ GFR in the 40s Cr 2.65 today w/ GFR in the 20s  Clinically dry today in setting syncopal event  IVF hydration  Hold nephrotoxic agents  Check FeNa Monitor

## 2023-05-19 NOTE — Assessment & Plan Note (Addendum)
+   brief syncopal episoded at facility  Appears clinically dry  Noted bradycardia (chronic) Will place on MIVF  Check 2D ECHO  Check orthostatics as appropriate  Consider cardiology consultation if bradycardia worsens

## 2023-05-19 NOTE — Assessment & Plan Note (Signed)
+   generalized confusion on presentation  Unclear of baseline-suspect end stage dementia  CT head WNL  No overt signs of infection  Monitor

## 2023-05-19 NOTE — Assessment & Plan Note (Signed)
HR in 50s  Looks to be chronic in review of EKGs x 1 year  Likely confounder in setting of syncopal event  2D ECHO pending  Monitor  Cardiology consultation as clinically indicated.

## 2023-05-19 NOTE — ED Triage Notes (Signed)
Pt presents to ED by [facility] from [facility] with syncopal episode. Pt found at dining table slumped over.   CBG 156 98/52 88% RA

## 2023-05-19 NOTE — ED Notes (Signed)
Rpt EKG was obtained.

## 2023-05-20 ENCOUNTER — Inpatient Hospital Stay (HOSPITAL_COMMUNITY)
Admit: 2023-05-20 | Discharge: 2023-05-20 | Disposition: A | Discharge status: Home / Self Care | Payer: No Typology Code available for payment source | Attending: Family Medicine | Admitting: Family Medicine

## 2023-05-20 DIAGNOSIS — R001 Bradycardia, unspecified: Secondary | ICD-10-CM | POA: Diagnosis not present

## 2023-05-20 DIAGNOSIS — C61 Malignant neoplasm of prostate: Secondary | ICD-10-CM

## 2023-05-20 DIAGNOSIS — R55 Syncope and collapse: Secondary | ICD-10-CM | POA: Diagnosis not present

## 2023-05-20 DIAGNOSIS — C7951 Secondary malignant neoplasm of bone: Secondary | ICD-10-CM

## 2023-05-20 DIAGNOSIS — N1831 Chronic kidney disease, stage 3a: Secondary | ICD-10-CM | POA: Diagnosis not present

## 2023-05-20 LAB — ECHOCARDIOGRAM COMPLETE
AR max vel: 1.77 cm2
AV Area VTI: 1.96 cm2
AV Area mean vel: 1.88 cm2
AV Mean grad: 7.7 mm[Hg]
AV Peak grad: 16.2 mm[Hg]
Ao pk vel: 2.01 m/s
Area-P 1/2: 6.12 cm2
Height: 66 in
MV VTI: 1.85 cm2
S' Lateral: 3 cm
Weight: 2610.25 [oz_av]

## 2023-05-20 LAB — CBC
HCT: 30.2 % — ABNORMAL LOW (ref 39.0–52.0)
Hemoglobin: 10.5 g/dL — ABNORMAL LOW (ref 13.0–17.0)
MCH: 31.3 pg (ref 26.0–34.0)
MCHC: 34.8 g/dL (ref 30.0–36.0)
MCV: 90.1 fL (ref 80.0–100.0)
Platelets: 144 10*3/uL — ABNORMAL LOW (ref 150–400)
RBC: 3.35 MIL/uL — ABNORMAL LOW (ref 4.22–5.81)
RDW: 12.8 % (ref 11.5–15.5)
WBC: 4.8 10*3/uL (ref 4.0–10.5)
nRBC: 0 % (ref 0.0–0.2)

## 2023-05-20 LAB — COMPREHENSIVE METABOLIC PANEL
ALT: 7 U/L (ref 0–44)
AST: 15 U/L (ref 15–41)
Albumin: 3.3 g/dL — ABNORMAL LOW (ref 3.5–5.0)
Alkaline Phosphatase: 62 U/L (ref 38–126)
Anion gap: 8 (ref 5–15)
BUN: 23 mg/dL (ref 8–23)
CO2: 29 mmol/L (ref 22–32)
Calcium: 12.2 mg/dL — ABNORMAL HIGH (ref 8.9–10.3)
Chloride: 110 mmol/L (ref 98–111)
Creatinine, Ser: 2.42 mg/dL — ABNORMAL HIGH (ref 0.61–1.24)
GFR, Estimated: 26 mL/min — ABNORMAL LOW (ref >60)
Glucose, Bld: 95 mg/dL (ref 70–99)
Potassium: 3 mmol/L — ABNORMAL LOW (ref 3.5–5.1)
Sodium: 147 mmol/L — ABNORMAL HIGH (ref 135–145)
Total Bilirubin: 1 mg/dL (ref 0.0–1.2)
Total Protein: 5.6 g/dL — ABNORMAL LOW (ref 6.5–8.1)

## 2023-05-20 LAB — CBG MONITORING, ED: Glucose-Capillary: 86 mg/dL (ref 70–99)

## 2023-05-20 MED ORDER — ABIRATERONE ACETATE 250 MG PO TABS: 1000.0000 mg | ORAL_TABLET | ORAL | Status: DC

## 2023-05-20 MED ORDER — LABETALOL HCL 5 MG/ML IV SOLN: 10.0000 mg | INTRAVENOUS | Status: DC | PRN

## 2023-05-20 MED ORDER — POTASSIUM CHLORIDE CRYS ER 20 MEQ PO TBCR
40.0000 meq | EXTENDED_RELEASE_TABLET | Freq: Once | ORAL | Status: AC
  Administered 2023-05-20: 40 meq via ORAL
  Filled 2023-05-20: qty 2

## 2023-05-20 MED ORDER — HYDRALAZINE HCL 20 MG/ML IJ SOLN
10.0000 mg | Freq: Four times a day (QID) | INTRAMUSCULAR | Status: DC | PRN
  Administered 2023-05-21 – 2023-05-25 (×3): 10 mg via INTRAVENOUS
  Filled 2023-05-20 (×4): qty 1

## 2023-05-20 MED ORDER — QUETIAPINE FUMARATE 25 MG PO TABS
25.0000 mg | ORAL_TABLET | Freq: Every day | ORAL | Status: DC
  Administered 2023-05-20 – 2023-06-02 (×11): 25 mg via ORAL
  Filled 2023-05-20 (×12): qty 1

## 2023-05-20 MED ORDER — POLYETHYLENE GLYCOL 3350 17 GM/SCOOP PO POWD: 8.5000 g | Freq: Every day | ORAL | Status: DC | PRN

## 2023-05-20 NOTE — ED Notes (Signed)
This RN updated Darral Dash RN about pt blood pressure and new PRN order.

## 2023-05-20 NOTE — ED Notes (Signed)
Pt brief changed, x1 urination

## 2023-05-20 NOTE — Progress Notes (Signed)
*  PRELIMINARY RESULTS* Echocardiogram 2D Echocardiogram has been performed.  Carolyne Fiscal 05/20/2023, 10:44 AM

## 2023-05-20 NOTE — ED Notes (Signed)
Pt brief changed and pt set up to eat lunch at this time

## 2023-05-20 NOTE — ED Notes (Signed)
Pt son at bedside.

## 2023-05-20 NOTE — Evaluation (Signed)
Physical Therapy Evaluation Patient Details Name: Justin Ali MRN: [id number] DOB: [birth year] Today's Date: 05/20/2023  History of Present Illness  Justin Ali is a 83 y.o. 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  History probably from staff in the setting of encephalopathy/dementia.  Patient with noted syncopal event at nursing facility.  Was found at dining table slumped over.  Blood sugar 156, blood pressure 90s over 50s.  Satting 80% on room air.   Clinical Impression  Patient received in bed, son present in room. Patient is agreeable to PT/OT assessment. He is mod I with bed mobility, transfers and cga for ambulation without AD. Patient is positive for orthostatic hypotension, however does not report dizziness or feeling light headed. He will continue to benefit from skilled PT to improve functional independence and safety with mobility.          If plan is discharge home, recommend the following: A little help with walking and/or transfers;A little help with bathing/dressing/bathroom;Direct supervision/assist for medications management;Direct supervision/assist for financial management   Can travel by private vehicle    yes    Equipment Recommendations None recommended by PT  Recommendations for Other Services       Functional Status Assessment Patient has had a recent decline in their functional status and demonstrates the ability to make significant improvements in function in a reasonable and predictable amount of time.     Precautions / Restrictions Precautions Precautions: Fall Restrictions Weight Bearing Restrictions Per Provider Order: No      Mobility  Bed Mobility Overal bed mobility: Modified Independent                  Transfers Overall transfer level: Modified independent Equipment used: None                     Ambulation/Gait Ambulation/Gait assistance: Contact guard assist Gait Distance (Feet): 125 Feet Assistive device: 1 person hand held assist, None Gait Pattern/deviations: Step-through pattern, Decreased step length - right, Decreased step length - left, Decreased stride length, Trunk flexed Gait velocity: decr     General Gait Details: patient is relatively steady without AD. No lob, some weakness in LEs noted. HR up to 125 and O2 down to 79% with ambulation on room air. Patient does not appeat sob, nor reports any problems during mobility.  Stairs            Wheelchair Mobility     Tilt Bed    Modified Rankin (Stroke Patients Only)       Balance Overall balance assessment: Mild deficits observed, not formally tested                                           Pertinent Vitals/Pain Pain Assessment Pain Assessment: No/denies pain    Home Living Family/patient expects to be discharged to:: Private residence Living Arrangements: Alone Available Help at Discharge: Personal care attendant;Available PRN/intermittently Type of Home: Independent living facility Home Access: Level entry       Home Layout: One level Home Equipment: None      Prior Function Prior Level of Function : Independent/Modified Independent             Mobility Comments: per son he has someone who checks in on him 2x a day, looking  into increasing that to 4 hours a day ADLs Comments: indpendent     Extremity/Trunk Assessment   Upper Extremity Assessment Upper Extremity Assessment: Defer to OT evaluation    Lower Extremity Assessment Lower Extremity Assessment: Generalized weakness    Cervical / Trunk Assessment Cervical / Trunk Assessment: Normal  Communication   Communication Communication: No apparent difficulties Cueing Techniques: Verbal cues;Gestural cues  Cognition Arousal: Alert Behavior During Therapy: Flat affect Overall Cognitive Status:  Impaired/Different from baseline Area of Impairment: Awareness, Memory, Problem solving, Orientation, Safety/judgement, Following commands                 Orientation Level: Disoriented to, Situation   Memory: Decreased short-term memory Following Commands: Follows one step commands with increased time Safety/Judgement: Decreased awareness of deficits, Decreased awareness of safety Awareness: Emergent Problem Solving: Slow processing, Requires verbal cues, Requires tactile cues          General Comments      Exercises     Assessment/Plan    PT Assessment Patient needs continued PT services  PT Problem List Decreased activity tolerance;Decreased mobility;Decreased cognition;Decreased safety awareness;Cardiopulmonary status limiting activity       PT Treatment Interventions Gait training;Functional mobility training;Therapeutic activities;Therapeutic exercise;Cognitive remediation;Patient/family education    PT Goals (Current goals can be found in the Care Plan section)  Acute Rehab PT Goals Patient Stated Goal: to return to [facility] PT Goal Formulation: With family Time For Goal Achievement: 05/29/23 Potential to Achieve Goals: Good    Frequency Min 1X/week     Co-evaluation PT/OT/SLP Co-Evaluation/Treatment: Yes Reason for Co-Treatment: Necessary to address cognition/behavior during functional activity;For patient/therapist safety;To address functional/ADL transfers PT goals addressed during session: Mobility/safety with mobility;Balance         AM-PAC PT "6 Clicks" Mobility  Outcome Measure Help needed turning from your back to your side while in a flat bed without using bedrails?: None Help needed moving from lying on your back to sitting on the side of a flat bed without using bedrails?: None Help needed moving to and from a bed to a chair (including a wheelchair)?: None Help needed standing up from a chair using your arms (e.g., wheelchair or bedside  chair)?: None Help needed to walk in hospital room?: A Little Help needed climbing 3-5 steps with a railing? : A Little 6 Click Score: 22    End of Session Equipment Utilized During Treatment: Gait belt;Oxygen Activity Tolerance: Patient tolerated treatment well Patient left: in bed;with call bell/phone within reach;with bed alarm set;with family/visitor present Nurse Communication: Mobility status;Other (comment) (Patient is orthostatic, HR elevated and O2 decreased with mobility) PT Visit Diagnosis: Other abnormalities of gait and mobility (R26.89)    Time: 4098-1191 PT Time Calculation (min) (ACUTE ONLY): 21 min   Charges:   PT Evaluation $PT Eval Moderate Complexity: 1 Mod   PT General Charges $$ ACUTE PT VISIT: 1 Visit         Gina Costilla, PT, GCS 05/20/23,11:33 AM

## 2023-05-20 NOTE — ED Notes (Signed)
Pt brief saturated, pt changed and new chucks placed. Pt repositioned in bed to eat breakfast. Pt denied any needs. Bed alarm on

## 2023-05-20 NOTE — ED Notes (Signed)
Hospitalist messaged for a PRN blood pressure medication, see orders

## 2023-05-20 NOTE — ED Notes (Signed)
Patient w/ urinary incontinence, laying in bed with urine soaked jeans. Linen change and patient cleaned of urine, placed in brief. Patient now resting.

## 2023-05-20 NOTE — ED Notes (Signed)
Pt linens changed and new brief placed on pt

## 2023-05-20 NOTE — Evaluation (Signed)
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 Poor awareness of deficits. Pt son endorses cognition is altered compared to previous baseline. PTA pt was amb with no AD, generally MOD I with ADL, assist for IADL at [facility]F. Pt is orthostatic upon assessment, not symptomatic.CGA required for UB/LB dressing, amb approx 125' with supervision-CGA. Orthostatics are as follows:  Semi supine 154/70 HR 50 bpm  Sitting 129/92 (102) HR 70 bpm Standing 115/75 (97) HR 95 bpm Standing 3 min 125/78 (90)  HR 98bpm After walking 143/75 HR 93 bpm   HR up to 125s with walking, spo2 79% on RA, >90% on RA after seated rest break   Pt is left in bed, all needs met. OT will continue to follow.     If plan is discharge home, recommend the following: A little help with walking and/or transfers;A little help with bathing/dressing/bathroom;Supervision due to cognitive status;Direct supervision/assist for financial management;Direct supervision/assist for medications management    Functional Status Assessment  Patient has had a recent decline in their functional status and demonstrates the ability to make significant improvements in function in a reasonable and predictable amount of time.  Equipment Recommendations  None recommended by OT    Recommendations for Other Services       Precautions / Restrictions Precautions Precautions: Fall Precaution Comments: watch BP and oxygen Restrictions Weight Bearing Restrictions Per Provider Order: No      Mobility Bed Mobility Overal bed mobility: Modified  Independent                  Transfers Overall transfer level: Needs assistance   Transfers: Sit to/from Stand Sit to Stand: Supervision                  Balance Overall balance assessment: Needs assistance Sitting-balance support: Feet supported Sitting balance-Leahy Scale: Good     Standing balance support: No upper extremity supported Standing balance-Leahy Scale: Fair                             ADL either performed or assessed with clinical judgement   ADL Overall ADL's : Needs assistance/impaired Eating/Feeding: Supervision/ safety;Sitting               Upper Body Dressing : Sitting;Contact guard assist Upper Body Dressing Details (indicate cue type and reason): donn gown Lower Body Dressing: Contact guard assist;Minimal assistance;Sitting/lateral leans Lower Body Dressing Details (indicate cue type and reason): donn/doff socks Toilet Transfer: Contact guard assist;Ambulation Toilet Transfer Details (indicate cue type and reason): simulated with no AD; intermittent vcs for pacing         Functional mobility during ADLs: Supervision/safety;Contact guard assist;Cueing for safety (approx 125')       Vision Patient Visual Report: No change from baseline       Perception         Praxis         Pertinent Vitals/Pain Pain Assessment Pain Assessment: No/denies pain     Extremity/Trunk Assessment Upper Extremity Assessment Upper Extremity Assessment: Overall WFL for tasks assessed   Lower Extremity Assessment  Lower Extremity Assessment: Generalized weakness   Cervical / Trunk Assessment Cervical / Trunk Assessment: Normal   Communication Communication Communication: Difficulty following commands/understanding Following commands: Follows one step commands with increased time Cueing Techniques: Verbal cues;Gestural cues;Tactile cues   Cognition Arousal: Alert Behavior During Therapy: Flat affect Overall Cognitive  Status: Impaired/Different from baseline Area of Impairment: Awareness, Memory, Problem solving, Orientation, Safety/judgement, Following commands                 Orientation Level: Disoriented to, Situation   Memory: Decreased short-term memory Following Commands: Follows one step commands with increased time Safety/Judgement: Decreased awareness of deficits, Decreased awareness of safety Awareness: Emergent Problem Solving: Slow processing, Requires verbal cues, Requires tactile cues       General Comments  spo2 to 79% on RA during amb, >90% with seated rest break; Pt is orthostatic, refer to flow sheet; no c/o symptoms    Exercises Other Exercises Other Exercises: edu pt and son re: role of OT, role of rehab   Shoulder Instructions      Home Living Family/patient expects to be discharged to:: Private residence Living Arrangements: Alone Available Help at Discharge: Personal care attendant;Available PRN/intermittently Type of Home: Independent living facility [facility]) Home Access: Level entry     Home Layout: One level     Bathroom Shower/Tub: Producer, television/film/video: Handicapped height     Home Equipment: Shower seat   Additional Comments: PRN use of shower seat      Prior Functioning/Environment Prior Level of Function : Needs assist       Physical Assist : ADLs (physical)   ADLs (physical): IADLs Mobility Comments: amb with no AD ADLs Comments: MOD I-I in ADL, eats in the dining hall, someone cleans for him, aid checks on him 2x per day; son reports considering increasing aid to 4 hrs per day        OT Problem List: Decreased activity tolerance;Decreased strength;Impaired balance (sitting and/or standing);Decreased cognition;Decreased safety awareness;Decreased knowledge of use of DME or AE      OT Treatment/Interventions: Self-care/ADL training;DME and/or AE instruction;Therapeutic activities;Balance training;Therapeutic  exercise;Patient/family education    OT Goals(Current goals can be found in the care plan section) Acute Rehab OT Goals Patient Stated Goal: go home OT Goal Formulation: With patient Time For Goal Achievement: 06/03/23 Potential to Achieve Goals: Good ADL Goals Pt Will Perform Grooming: sitting;with modified independence;standing Pt Will Perform Lower Body Dressing: with modified independence;sitting/lateral leans;sit to/from stand Pt Will Transfer to Toilet: with modified independence;ambulating Pt Will Perform Toileting - Clothing Manipulation and hygiene: with modified independence;sitting/lateral leans;sit to/from stand  OT Frequency: Min 1X/week    Co-evaluation PT/OT/SLP Co-Evaluation/Treatment: Yes Reason for Co-Treatment: For patient/therapist safety;To address functional/ADL transfers;Necessary to address cognition/behavior during functional activity PT goals addressed during session: Mobility/safety with mobility;Balance OT goals addressed during session: ADL's and self-care      AM-PAC OT "6 Clicks" Daily Activity     Outcome Measure Help from another person eating meals?: None Help from another person taking care of personal grooming?: None Help from another person toileting, which includes using toliet, bedpan, or urinal?: A Lot Help from another person bathing (including washing, rinsing, drying)?: A Lot Help from another person to put on and taking off regular upper body clothing?: None Help from another person to put on and taking off regular lower body clothing?: A Little 6 Click Score: 19   End of Session Equipment Utilized During Treatment: Gait belt Nurse Communication:  Mobility status (vitals)  Activity Tolerance: Patient tolerated treatment well Patient left: in bed;with call bell/phone within reach;with bed alarm set;with chair alarm set  OT Visit Diagnosis: Other abnormalities of gait and mobility (R26.89);Unsteadiness on feet (R26.81)                 Time: 1610-9604 OT Time Calculation (min): 27 min Charges:  OT General Charges $OT Visit: 1 Visit OT Evaluation $OT Eval Moderate Complexity: 1 Mod  Oleta Mouse, OTD OTR/L  05/20/23, 1:27 PM

## 2023-05-20 NOTE — Progress Notes (Signed)
PROGRESS NOTE    Justin AUBRY  Ali:[id number] DOB: Oct 05, 1940 DOA: 05/19/2023 PCP: Eustaquio Boyden, MD  Chief Complaint  Patient presents with   Loss of Consciousness   Bradycardia    Hospital Course:  Justin Ali is 83 y.o. male with prostate cancer, hypertension, sinus bradycardia, dementia, who presented after syncopal episode in his independent living facility.  Found at the dining table slumped over.  Blood sugar on arrival 156, blood pressure 90/50, satting 80% on room air.  Denied any recent fever or chills.  ER workup reveals WBC 5.2, hemoglobin 9.3, platelets 149, COVID/flu/RSV negative.  Creatinine 2.65.  Chest x-ray stable apart from known metastatic prostate cancer.  Subjective: No acute events overnight. On evaluation today patient is pleasant, no acute complaints   Objective: Vitals:   05/20/23 1339 05/20/23 1345 05/20/23 1645 05/20/23 1708  BP:  (!) 161/72 (!) 170/93 (!) 154/90  Pulse:  (!) 50 (!) 56 (!) 50  Resp:  16 15 17   Temp: 99.2 F (37.3 C)   99.1 F (37.3 C)  TempSrc: Oral   Oral  SpO2:  96% 94% 96%  Weight:      Height:        Intake/Output Summary (Last 24 hours) at 05/20/2023 1738 Last data filed at 05/20/2023 1129 Gross per 24 hour  Intake 800 ml  Output --  Net 800 ml   Filed Weights   05/19/23 0911  Weight: 74 kg    Examination: General exam: Appears calm and comfortable, NAD  Respiratory system: No work of breathing, symmetric chest wall expansion Cardiovascular system: S1 & S2 heard, RRR.  Gastrointestinal system: Abdomen is nondistended, soft and nontender.  Neuro: Alert and oriented to self and year, disoriented to situation and month. Extremities: Symmetric, expected ROM Skin: No rashes, lesions Psychiatry: Mood & affect appropriate for situation.   Assessment & Plan:  Principal Problem:   Syncope Active Problems:   Sinus bradycardia   Encephalopathy   Chronic kidney disease, stage 3a (HCC)   Primary malignant  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 (HCC)    Syncope, multifactorial - Orthostatic vitals positive, likely contributed. BP 154/70, pulse 50 bpm.  Standing 115/75 pulse 97 bpm. - I also suspect patient was dehydrated, presented clinically dry.  Echo reveals EF 55 to 60%, no significant diastolic dysfunction, patient is taking Lasix daily at home (for chronic pedal edema).  Have recommended we switch to as needed dosing - Patient also chronically bradycardic which may be playing a role  Bradycardia - Heart rate in the 50s - Appears chronic on review of prior EKGs - Echo mostly unremarkable - Consider cardiology consult if worsening  Dementia - Son at bedside reports at baseline patient has short-term memory deficits but is able to complete his ADLs, lives in an independent living facility - Driving status previously revoked - PT/OT evals pending - Head CT within normal limits - Continue nightly Seroquel  Chronic kidney disease stage IIIa - Baseline creatinine 1-1.4 with GFR in 40s - Creatinine on arrival 2.65, improving now - Patient was clinically dry on arrival, status post IV hydration - Hold nephrotoxic agents - Renally dose when needed  Metastatic prostate cancer - Metastatic spread to the bones,numerous sclerotic lesions to axial and appendicular skeleton on CT 12/2022. - Previously on Zytiga and prednisone, appears only on Zytiga now we will continue.  DVT prophylaxis: lovenox   Code Status: Full Code Family Communication: Son at bedside for discussion Disposition:  Status is: Inpatient,  TOC consult for SNF    Consultants:      Procedures:    Antimicrobials:  Anti-infectives (From admission, onward)    None       Data Reviewed: I have personally reviewed following labs and imaging studies CBC: Recent Labs  Lab 05/19/23 0923 05/20/23 0309  WBC 5.2 4.8  NEUTROABS 3.5  --   HGB 11.3* 10.5*  HCT 33.7* 30.2*  MCV 91.8 90.1  PLT 149* 144*   Basic  Metabolic Panel: Recent Labs  Lab 05/19/23 0923 05/20/23 0309  NA 144 147*  K 2.9* 3.0*  CL 102 110  CO2 31 29  GLUCOSE 127* 95  BUN 25* 23  CREATININE 2.65* 2.42*  CALCIUM 12.6* 12.2*   GFR: Estimated Creatinine Clearance: 21.6 mL/min (A) (by C-G formula based on SCr of 2.42 mg/dL (H)). Liver Function Tests: Recent Labs  Lab 05/20/23 0309  AST 15  ALT 7  ALKPHOS 62  BILITOT 1.0  PROT 5.6*  ALBUMIN 3.3*   CBG: Recent Labs  Lab 05/20/23 0528  GLUCAP 86    Recent Results (from the past 240 hours)  Resp panel by RT-PCR (RSV, Flu A&B, Covid) Anterior Nasal Swab     Status: None   Collection Time: 05/19/23 10:00 AM   Specimen: Anterior Nasal Swab  Result Value Ref Range Status   SARS Coronavirus 2 by RT PCR NEGATIVE NEGATIVE Final    Comment: (NOTE) SARS-CoV-2 target nucleic acids are NOT DETECTED.  The SARS-CoV-2 RNA is generally detectable in upper respiratory specimens during the acute phase of infection. The lowest concentration of SARS-CoV-2 viral copies this assay can detect is 138 copies/mL. A negative result does not preclude SARS-Cov-2 infection and should not be used as the sole basis for treatment or other patient management decisions. A negative result may occur with  improper specimen collection/handling, submission of specimen other than nasopharyngeal swab, presence of viral mutation(s) within the areas targeted by this assay, and inadequate number of viral copies(<138 copies/mL). A negative result must be combined with clinical observations, patient history, and epidemiological information. The expected result is Negative.  Fact Sheet for Patients:  BloggerCourse.com  Fact Sheet for Healthcare Providers:  SeriousBroker.it  This test is no t yet approved or cleared by the [place] FDA and  has been authorized for detection and/or diagnosis of SARS-CoV-2 by FDA under an Emergency Use  Authorization (EUA). This EUA will remain  in effect (meaning this test can be used) for the duration of the COVID-19 declaration under Section 564(b)(1) of the Act, 21 U.S.C.section 360bbb-3(b)(1), unless the authorization is terminated  or revoked sooner.       Influenza A by PCR NEGATIVE NEGATIVE Final   Influenza B by PCR NEGATIVE NEGATIVE Final    Comment: (NOTE) The Xpert Xpress SARS-CoV-2/FLU/RSV plus assay is intended as an aid in the diagnosis of influenza from Nasopharyngeal swab specimens and should not be used as a sole basis for treatment. Nasal washings and aspirates are unacceptable for Xpert Xpress SARS-CoV-2/FLU/RSV testing.  Fact Sheet for Patients: BloggerCourse.com  Fact Sheet for Healthcare Providers: SeriousBroker.it  This test is not yet approved or cleared by the [place] FDA and has been authorized for detection and/or diagnosis of SARS-CoV-2 by FDA under an Emergency Use Authorization (EUA). This EUA will remain in effect (meaning this test can be used) for the duration of the COVID-19 declaration under Section 564(b)(1) of the Act, 21 U.S.C. section 360bbb-3(b)(1), unless the authorization is terminated or revoked.  Resp Syncytial Virus by PCR NEGATIVE NEGATIVE Final    Comment: (NOTE) Fact Sheet for Patients: BloggerCourse.com  Fact Sheet for Healthcare Providers: SeriousBroker.it  This test is not yet approved or cleared by the [place] FDA and has been authorized for detection and/or diagnosis of SARS-CoV-2 by FDA under an Emergency Use Authorization (EUA). This EUA will remain in effect (meaning this test can be used) for the duration of the COVID-19 declaration under Section 564(b)(1) of the Act, 21 U.S.C. section 360bbb-3(b)(1), unless the authorization is terminated or revoked.  Performed at [facility], [address] [place], [place] [address]      Radiology Studies: ECHOCARDIOGRAM COMPLETE Result Date: 05/20/2023    ECHOCARDIOGRAM REPORT   Patient Name:   Justin Ali Date of Exam: 05/20/2023 Medical Rec #:  [id number]        Height:       66.0 in Accession #:    [id number]       Weight:       163.1 lb Date of Birth:  1941-01-22        BSA:          1.834 m Patient Age:    82 years         BP:           140/67 mmHg Patient Gender: M                HR:           60 bpm. Exam Location:  [facility] Procedure: 2D Echo, Cardiac Doppler and Color Doppler Indications:     Syncope  History:         Patient has no prior history of Echocardiogram examinations.                  CAD, Arrythmias:Bradycardia, Signs/Symptoms:Syncope; Risk                  Factors:Hypertension, Dyslipidemia and Sleep Apnea. CKD.  Sonographer:     Mikki Harbor Referring Phys:  [phone] Francoise Schaumann [place] Diagnosing Phys: Julien Nordmann MD  Sonographer Comments: Technically difficult study due to poor echo windows. Image acquisition challenging due to respiratory motion. IMPRESSIONS  1. Left ventricular ejection fraction, by estimation, is 55 to 60%. The left ventricle has normal function. The left ventricle has no regional wall motion abnormalities. Left ventricular diastolic parameters are indeterminate.  2. Right ventricular systolic function is normal. The right ventricular size is normal. There is normal pulmonary artery systolic pressure. The estimated right ventricular systolic pressure is 33.5 mmHg.  3. Left atrial size was mildly dilated.  4. The mitral valve is normal in structure. Mild mitral valve regurgitation. No evidence of mitral stenosis.  5. The aortic valve is normal in structure. There is moderate calcification of the aortic valve. Aortic valve regurgitation is not visualized. Aortic valve sclerosis/calcification is present, without any evidence of aortic stenosis. Aortic valve mean gradient measures 7.7 mmHg.  6. The inferior vena cava  is normal in size with greater than 50% respiratory variability, suggesting right atrial pressure of 3 mmHg. FINDINGS  Left Ventricle: Left ventricular ejection fraction, by estimation, is 55 to 60%. The left ventricle has normal function. The left ventricle has no regional wall motion abnormalities. The left ventricular internal cavity size was normal in size. There is  no left ventricular hypertrophy. Left ventricular diastolic parameters are indeterminate. Right Ventricle: The right ventricular size is normal. No increase in right ventricular  wall thickness. Right ventricular systolic function is normal. There is normal pulmonary artery systolic pressure. The tricuspid regurgitant velocity is 2.76 m/s, and  with an assumed right atrial pressure of 3 mmHg, the estimated right ventricular systolic pressure is 33.5 mmHg. Left Atrium: Left atrial size was mildly dilated. Right Atrium: Right atrial size was normal in size. Pericardium: There is no evidence of pericardial effusion. Mitral Valve: The mitral valve is normal in structure. Mild mitral valve regurgitation. No evidence of mitral valve stenosis. MV peak gradient, 5.2 mmHg. The mean mitral valve gradient is 2.0 mmHg. Tricuspid Valve: The tricuspid valve is normal in structure. Tricuspid valve regurgitation is mild . No evidence of tricuspid stenosis. Aortic Valve: The aortic valve is normal in structure. There is moderate calcification of the aortic valve. Aortic valve regurgitation is not visualized. Aortic valve sclerosis/calcification is present, without any evidence of aortic stenosis. Aortic valve mean gradient measures 7.7 mmHg. Aortic valve peak gradient measures 16.2 mmHg. Aortic valve area, by VTI measures 1.96 cm. Pulmonic Valve: The pulmonic valve was normal in structure. Pulmonic valve regurgitation is not visualized. No evidence of pulmonic stenosis. Aorta: The aortic root is normal in size and structure. Venous: The inferior vena cava is normal  in size with greater than 50% respiratory variability, suggesting right atrial pressure of 3 mmHg. IAS/Shunts: No atrial level shunt detected by color flow Doppler.  LEFT VENTRICLE PLAX 2D LVIDd:         4.50 cm   Diastology LVIDs:         3.00 cm   LV e' medial:   7.83 cm/s LV PW:         1.20 cm   LV E/e' medial: 12.5 LV IVS:        1.30 cm LVOT diam:     2.10 cm LV SV:         81 LV SV Index:   44 LVOT Area:     3.46 cm  RIGHT VENTRICLE RV Basal diam:  3.45 cm RV Mid diam:    3.20 cm RV S prime:     16.90 cm/s TAPSE (M-mode): 3.0 cm LEFT ATRIUM             Index        RIGHT ATRIUM           Index LA diam:        3.40 cm 1.85 cm/m   RA Area:     18.40 cm LA Vol (A2C):   70.4 ml 38.39 ml/m  RA Volume:   48.50 ml  26.45 ml/m LA Vol (A4C):   50.5 ml 27.54 ml/m LA Biplane Vol: 64.0 ml 34.90 ml/m  AORTIC VALVE                     PULMONIC VALVE AV Area (Vmax):    1.77 cm      PV Vmax:       0.94 m/s AV Area (Vmean):   1.88 cm      PV Peak grad:  3.5 mmHg AV Area (VTI):     1.96 cm AV Vmax:           201.33 cm/s AV Vmean:          121.667 cm/s AV VTI:            0.415 m AV Peak Grad:      16.2 mmHg AV Mean Grad:      7.7 mmHg LVOT  Vmax:         103.00 cm/s LVOT Vmean:        66.100 cm/s LVOT VTI:          0.235 m LVOT/AV VTI ratio: 0.57  AORTA Ao Root diam: 3.50 cm MITRAL VALVE                TRICUSPID VALVE MV Area (PHT): 6.12 cm     TR Peak grad:   30.5 mmHg MV Area VTI:   1.85 cm     TR Vmax:        276.00 cm/s MV Peak grad:  5.2 mmHg MV Mean grad:  2.0 mmHg     SHUNTS MV Vmax:       1.14 m/s     Systemic VTI:  0.24 m MV Vmean:      58.4 cm/s    Systemic Diam: 2.10 cm MV Decel Time: 124 msec MV E velocity: 97.70 cm/s MV A velocity: 106.00 cm/s MV E/A ratio:  0.92 Julien Nordmann MD Electronically signed by Julien Nordmann MD Signature Date/Time: 05/20/2023/10:50:03 AM    Final    DG Chest Portable 1 View Result Date: 05/19/2023 CLINICAL DATA:  Hypoxia with syncopal episode. History of metastatic prostate  cancer. EXAM: PORTABLE CHEST 1 VIEW COMPARISON:  Radiographs 11/20/2022 and 04/25/2013. Chest CT 09/16/2021. FINDINGS: 0954 hours. The patient's mandible overlies the thoracic inlet. The heart size and mediastinal contours are stable with aortic atherosclerosis. There are low lung volumes without evidence of confluent airspace disease, pleural effusion or pneumothorax. Widespread sclerotic osseous metastatic disease is again noted without evidence of pathologic fracture. Telemetry leads overlie the chest. IMPRESSION: 1. Low lung volumes without evidence of acute cardiopulmonary process. 2. Widespread sclerotic osseous metastatic disease. Electronically Signed   By: Carey Bullocks M.D.   On: 05/19/2023 10:44    Scheduled Meds:  enoxaparin (LOVENOX) injection  30 mg Subcutaneous Q24H   sodium chloride flush  3 mL Intravenous Q12H   Continuous Infusions:   LOS: 1 day    Time spent:   Debarah Crape, DO [facility]  To contact the attending physician between 7A-7P please use Epic Chat. To contact the covering physician during after hours 7P-7A, please review Amion.   05/20/2023, 5:38 PM   *This document has been created with the assistance of dictation software. Please excuse typographical errors. *

## 2023-05-21 DIAGNOSIS — R55 Syncope and collapse: Secondary | ICD-10-CM | POA: Diagnosis not present

## 2023-05-21 LAB — COMPREHENSIVE METABOLIC PANEL
ALT: 8 U/L (ref 0–44)
AST: 17 U/L (ref 15–41)
Albumin: 3.4 g/dL — ABNORMAL LOW (ref 3.5–5.0)
Alkaline Phosphatase: 61 U/L (ref 38–126)
Anion gap: 11 (ref 5–15)
BUN: 22 mg/dL (ref 8–23)
CO2: 28 mmol/L (ref 22–32)
Calcium: 12.9 mg/dL — ABNORMAL HIGH (ref 8.9–10.3)
Chloride: 108 mmol/L (ref 98–111)
Creatinine, Ser: 2.36 mg/dL — ABNORMAL HIGH (ref 0.61–1.24)
GFR, Estimated: 27 mL/min — ABNORMAL LOW (ref >60)
Glucose, Bld: 90 mg/dL (ref 70–99)
Potassium: 2.7 mmol/L — CL (ref 3.5–5.1)
Sodium: 147 mmol/L — ABNORMAL HIGH (ref 135–145)
Total Bilirubin: 1.2 mg/dL (ref 0.0–1.2)
Total Protein: 6.1 g/dL — ABNORMAL LOW (ref 6.5–8.1)

## 2023-05-21 LAB — CBC WITH DIFFERENTIAL/PLATELET
Abs Immature Granulocytes: 0.02 10*3/uL (ref 0.00–0.07)
Basophils Absolute: 0 10*3/uL (ref 0.0–0.1)
Basophils Relative: 0 %
Eosinophils Absolute: 0.2 10*3/uL (ref 0.0–0.5)
Eosinophils Relative: 4 %
HCT: 31.6 % — ABNORMAL LOW (ref 39.0–52.0)
Hemoglobin: 11.3 g/dL — ABNORMAL LOW (ref 13.0–17.0)
Immature Granulocytes: 0 %
Lymphocytes Relative: 21 %
Lymphs Abs: 1.1 10*3/uL (ref 0.7–4.0)
MCH: 31 pg (ref 26.0–34.0)
MCHC: 35.8 g/dL (ref 30.0–36.0)
MCV: 86.6 fL (ref 80.0–100.0)
Monocytes Absolute: 0.5 10*3/uL (ref 0.1–1.0)
Monocytes Relative: 9 %
Neutro Abs: 3.6 10*3/uL (ref 1.7–7.7)
Neutrophils Relative %: 66 %
Platelets: 151 10*3/uL (ref 150–400)
RBC: 3.65 MIL/uL — ABNORMAL LOW (ref 4.22–5.81)
RDW: 12.6 % (ref 11.5–15.5)
WBC: 5.4 10*3/uL (ref 4.0–10.5)
nRBC: 0 % (ref 0.0–0.2)

## 2023-05-21 MED ORDER — CALCIUM CARBONATE 1250 (500 CA) MG PO TABS
1.0000 | ORAL_TABLET | Freq: Three times a day (TID) | ORAL | Status: DC
  Administered 2023-05-21: 1250 mg via ORAL
  Filled 2023-05-21: qty 1

## 2023-05-21 MED ORDER — SODIUM CHLORIDE 0.9 % IV SOLN: INTRAVENOUS | Status: DC

## 2023-05-21 MED ORDER — POTASSIUM CHLORIDE CRYS ER 20 MEQ PO TBCR
40.0000 meq | EXTENDED_RELEASE_TABLET | ORAL | Status: AC
  Administered 2023-05-21 (×3): 40 meq via ORAL
  Filled 2023-05-21 (×3): qty 2

## 2023-05-21 MED ORDER — ZOLEDRONIC ACID 4 MG/100ML IV SOLN
4.0000 mg | Freq: Once | INTRAVENOUS | Status: AC
  Administered 2023-05-21: 4 mg via INTRAVENOUS
  Filled 2023-05-21: qty 100

## 2023-05-21 NOTE — Progress Notes (Signed)
PROGRESS NOTE    Justin Ali  SWF:[id number] DOB: [birth year] DOA: 05/19/2023 PCP: Eustaquio Boyden, MD  Chief Complaint  Patient presents with   Loss of Consciousness   Bradycardia    Hospital Course:  Justin Ali is 83 y.o. male with prostate cancer, hypertension, sinus bradycardia, dementia, who presented after syncopal episode in his independent living facility.  Found at the dining table slumped over.  Blood sugar on arrival 156, blood pressure 90/50, satting 80% on room air.  Denied any recent fever or chills.  ER workup reveals WBC 5.2, hemoglobin 9.3, platelets 149, COVID/flu/RSV negative.  Creatinine 2.65.  Chest x-ray stable apart from known metastatic prostate cancer.  Subjective: No acute events overnight. On evaluation today patient has no acute complaints.    Objective: Vitals:   05/20/23 1959 05/21/23 0006 05/21/23 0421 05/21/23 0632  BP: (!) 167/80 (!) 155/78 (!) 193/88 (!) 177/89  Pulse: 63 77 71 80  Resp: 18 18 18 18   Temp: 98.9 F (37.2 C) 98.8 F (37.1 C) 98.4 F (36.9 C) 98.3 F (36.8 C)  TempSrc: Oral Oral  Oral  SpO2: 97% 97% 97% 96%  Weight:      Height:        Intake/Output Summary (Last 24 hours) at 05/21/2023 0738 Last data filed at 05/20/2023 1129 Gross per 24 hour  Intake 800 ml  Output --  Net 800 ml   Filed Weights   05/19/23 0911  Weight: 74 kg    Examination: General exam: Appears calm and comfortable, NAD  Respiratory system: No work of breathing, symmetric chest wall expansion Cardiovascular system: S1 & S2 heard, RRR.  Gastrointestinal system: Abdomen is nondistended, soft and nontender.  Neuro: Alert and oriented to self and year, disoriented to situation and month. Extremities: Symmetric, expected ROM Skin: No rashes, lesions Psychiatry: Mood & affect appropriate for situation.   Assessment & Plan:  Principal Problem:   Syncope Active Problems:   Sinus bradycardia   Encephalopathy   Chronic kidney disease,  stage 3a (HCC)   Primary malignant 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 (HCC)    Syncope, multifactorial - Orthostatic vitals positive, likely contributed. BP 154/70, pulse 50 bpm.  Standing 115/75 pulse 97 bpm. - I also suspect patient was dehydrated, presented clinically dry.  Echo reveals EF 55 to 60%, no significant diastolic dysfunction, patient is taking Lasix daily at home (for chronic pedal edema).  Have recommended we switch to as needed dosing - Patient also chronically bradycardic which may be playing a role  Bradycardia - Heart rate in the 50s - Appears chronic on review of prior EKGs - Echo mostly unremarkable - Consider cardiology consult if worsening  Hypercalcemia - Calcium 12.9, steadily rising - Likely secondary to malignancy as patient has diffuse osteolytic lesions - Alk phos within normal limits -- PTH pending - On normal saline - Nephrology consult,  initiated zoledronic acid.  - Onco consult  Hypokalemia - Replace as needed  Dementia - Son reports at baseline patient has short-term memory deficits but is able to complete his ADLs, lives in an independent living facility - Driving status previously revoked - PT/OT evals pending - Head CT within normal limits - Continue nightly Seroquel  Chronic kidney disease stage IIIa - Baseline creatinine 1-1.4 with GFR in 40s - Creatinine on arrival 2.65, improving now - Patient was clinically dry on arrival, status post IV hydration - Hold nephrotoxic agents - Renally dose when needed  Metastatic prostate cancer - Metastatic  spread to the bones, numerous sclerotic lesions to axial and appendicular skeleton on CT 12/2022. - Previously on Zytiga and prednisone, appears only on Zytiga now we will continue - Appears patient was previously on hospice with a joint decision of his daughter. His son, [name] has recently revoked hospice and is now requesting full aggressive care - Have engaged oncology during this  admission give worsening hypercalcemia  DVT prophylaxis: lovenox   Code Status: Full Code Family Communication: Caregiver at bedside, called son [name] with no answer. Disposition:  Status is: Inpatient, TOC consult for SNF    Consultants:      Procedures:    Antimicrobials:  Anti-infectives (From admission, onward)    None       Data Reviewed: I have personally reviewed following labs and imaging studies CBC: Recent Labs  Lab 05/19/23 0923 05/20/23 0309  WBC 5.2 4.8  NEUTROABS 3.5  --   HGB 11.3* 10.5*  HCT 33.7* 30.2*  MCV 91.8 90.1  PLT 149* 144*   Basic Metabolic Panel: Recent Labs  Lab 05/19/23 0923 05/20/23 0309  NA 144 147*  K 2.9* 3.0*  CL 102 110  CO2 31 29  GLUCOSE 127* 95  BUN 25* 23  CREATININE 2.65* 2.42*  CALCIUM 12.6* 12.2*   GFR: Estimated Creatinine Clearance: 21.6 mL/min (A) (by C-G formula based on SCr of 2.42 mg/dL (H)). Liver Function Tests: Recent Labs  Lab 05/20/23 0309  AST 15  ALT 7  ALKPHOS 62  BILITOT 1.0  PROT 5.6*  ALBUMIN 3.3*   CBG: Recent Labs  Lab 05/20/23 0528  GLUCAP 86    Recent Results (from the past 240 hours)  Resp panel by RT-PCR (RSV, Flu A&B, Covid) Anterior Nasal Swab     Status: None   Collection Time: 05/19/23 10:00 AM   Specimen: Anterior Nasal Swab  Result Value Ref Range Status   SARS Coronavirus 2 by RT PCR NEGATIVE NEGATIVE Final    Comment: (NOTE) SARS-CoV-2 target nucleic acids are NOT DETECTED.  The SARS-CoV-2 RNA is generally detectable in upper respiratory specimens during the acute phase of infection. The lowest concentration of SARS-CoV-2 viral copies this assay can detect is 138 copies/mL. A negative result does not preclude SARS-Cov-2 infection and should not be used as the sole basis for treatment or other patient management decisions. A negative result may occur with  improper specimen collection/handling, submission of specimen other than nasopharyngeal swab, presence of  viral mutation(s) within the areas targeted by this assay, and inadequate number of viral copies(<138 copies/mL). A negative result must be combined with clinical observations, patient history, and epidemiological information. The expected result is Negative.  Fact Sheet for Patients:  BloggerCourse.com  Fact Sheet for Healthcare Providers:  SeriousBroker.it  This test is no t yet approved or cleared by the [place] FDA and  has been authorized for detection and/or diagnosis of SARS-CoV-2 by FDA under an Emergency Use Authorization (EUA). This EUA will remain  in effect (meaning this test can be used) for the duration of the COVID-19 declaration under Section 564(b)(1) of the Act, 21 U.S.C.section 360bbb-3(b)(1), unless the authorization is terminated  or revoked sooner.       Influenza A by PCR NEGATIVE NEGATIVE Final   Influenza B by PCR NEGATIVE NEGATIVE Final    Comment: (NOTE) The Xpert Xpress SARS-CoV-2/FLU/RSV plus assay is intended as an aid in the diagnosis of influenza from Nasopharyngeal swab specimens and should not be used as a sole basis for  treatment. Nasal washings and aspirates are unacceptable for Xpert Xpress SARS-CoV-2/FLU/RSV testing.  Fact Sheet for Patients: BloggerCourse.com  Fact Sheet for Healthcare Providers: SeriousBroker.it  This test is not yet approved or cleared by the [place] FDA and has been authorized for detection and/or diagnosis of SARS-CoV-2 by FDA under an Emergency Use Authorization (EUA). This EUA will remain in effect (meaning this test can be used) for the duration of the COVID-19 declaration under Section 564(b)(1) of the Act, 21 U.S.C. section 360bbb-3(b)(1), unless the authorization is terminated or revoked.     Resp Syncytial Virus by PCR NEGATIVE NEGATIVE Final    Comment: (NOTE) Fact Sheet for  Patients: BloggerCourse.com  Fact Sheet for Healthcare Providers: SeriousBroker.it  This test is not yet approved or cleared by the [place] FDA and has been authorized for detection and/or diagnosis of SARS-CoV-2 by FDA under an Emergency Use Authorization (EUA). This EUA will remain in effect (meaning this test can be used) for the duration of the COVID-19 declaration under Section 564(b)(1) of the Act, 21 U.S.C. section 360bbb-3(b)(1), unless the authorization is terminated or revoked.  Performed at [facility], [address] [place], [place] [address]      Radiology Studies: ECHOCARDIOGRAM COMPLETE Result Date: 05/20/2023    ECHOCARDIOGRAM REPORT   Patient Name:   Justin Ali Date of Exam: 05/20/2023 Medical Rec #:  [id number]        Height:       66.0 in Accession #:    [id number]       Weight:       163.1 lb Date of Birth:  08/22/1940        BSA:          1.834 m Patient Age:    82 years         BP:           140/67 mmHg Patient Gender: M                HR:           60 bpm. Exam Location:  [facility] Procedure: 2D Echo, Cardiac Doppler and Color Doppler Indications:     Syncope  History:         Patient has no prior history of Echocardiogram examinations.                  CAD, Arrythmias:Bradycardia, Signs/Symptoms:Syncope; Risk                  Factors:Hypertension, Dyslipidemia and Sleep Apnea. CKD.  Sonographer:     Mikki Harbor Referring Phys:  [phone] Francoise Schaumann [place] Diagnosing Phys: Julien Nordmann MD  Sonographer Comments: Technically difficult study due to poor echo windows. Image acquisition challenging due to respiratory motion. IMPRESSIONS  1. Left ventricular ejection fraction, by estimation, is 55 to 60%. The left ventricle has normal function. The left ventricle has no regional wall motion abnormalities. Left ventricular diastolic parameters are indeterminate.  2. Right ventricular systolic function is normal.  The right ventricular size is normal. There is normal pulmonary artery systolic pressure. The estimated right ventricular systolic pressure is 33.5 mmHg.  3. Left atrial size was mildly dilated.  4. The mitral valve is normal in structure. Mild mitral valve regurgitation. No evidence of mitral stenosis.  5. The aortic valve is normal in structure. There is moderate calcification of the aortic valve. Aortic valve regurgitation is not visualized. Aortic valve sclerosis/calcification is present, without any evidence of  aortic stenosis. Aortic valve mean gradient measures 7.7 mmHg.  6. The inferior vena cava is normal in size with greater than 50% respiratory variability, suggesting right atrial pressure of 3 mmHg. FINDINGS  Left Ventricle: Left ventricular ejection fraction, by estimation, is 55 to 60%. The left ventricle has normal function. The left ventricle has no regional wall motion abnormalities. The left ventricular internal cavity size was normal in size. There is  no left ventricular hypertrophy. Left ventricular diastolic parameters are indeterminate. Right Ventricle: The right ventricular size is normal. No increase in right ventricular wall thickness. Right ventricular systolic function is normal. There is normal pulmonary artery systolic pressure. The tricuspid regurgitant velocity is 2.76 m/s, and  with an assumed right atrial pressure of 3 mmHg, the estimated right ventricular systolic pressure is 33.5 mmHg. Left Atrium: Left atrial size was mildly dilated. Right Atrium: Right atrial size was normal in size. Pericardium: There is no evidence of pericardial effusion. Mitral Valve: The mitral valve is normal in structure. Mild mitral valve regurgitation. No evidence of mitral valve stenosis. MV peak gradient, 5.2 mmHg. The mean mitral valve gradient is 2.0 mmHg. Tricuspid Valve: The tricuspid valve is normal in structure. Tricuspid valve regurgitation is mild . No evidence of tricuspid stenosis. Aortic  Valve: The aortic valve is normal in structure. There is moderate calcification of the aortic valve. Aortic valve regurgitation is not visualized. Aortic valve sclerosis/calcification is present, without any evidence of aortic stenosis. Aortic valve mean gradient measures 7.7 mmHg. Aortic valve peak gradient measures 16.2 mmHg. Aortic valve area, by VTI measures 1.96 cm. Pulmonic Valve: The pulmonic valve was normal in structure. Pulmonic valve regurgitation is not visualized. No evidence of pulmonic stenosis. Aorta: The aortic root is normal in size and structure. Venous: The inferior vena cava is normal in size with greater than 50% respiratory variability, suggesting right atrial pressure of 3 mmHg. IAS/Shunts: No atrial level shunt detected by color flow Doppler.  LEFT VENTRICLE PLAX 2D LVIDd:         4.50 cm   Diastology LVIDs:         3.00 cm   LV e' medial:   7.83 cm/s LV PW:         1.20 cm   LV E/e' medial: 12.5 LV IVS:        1.30 cm LVOT diam:     2.10 cm LV SV:         81 LV SV Index:   44 LVOT Area:     3.46 cm  RIGHT VENTRICLE RV Basal diam:  3.45 cm RV Mid diam:    3.20 cm RV S prime:     16.90 cm/s TAPSE (M-mode): 3.0 cm LEFT ATRIUM             Index        RIGHT ATRIUM           Index LA diam:        3.40 cm 1.85 cm/m   RA Area:     18.40 cm LA Vol (A2C):   70.4 ml 38.39 ml/m  RA Volume:   48.50 ml  26.45 ml/m LA Vol (A4C):   50.5 ml 27.54 ml/m LA Biplane Vol: 64.0 ml 34.90 ml/m  AORTIC VALVE                     PULMONIC VALVE AV Area (Vmax):    1.77 cm      PV Vmax:  0.94 m/s AV Area (Vmean):   1.88 cm      PV Peak grad:  3.5 mmHg AV Area (VTI):     1.96 cm AV Vmax:           201.33 cm/s AV Vmean:          121.667 cm/s AV VTI:            0.415 m AV Peak Grad:      16.2 mmHg AV Mean Grad:      7.7 mmHg LVOT Vmax:         103.00 cm/s LVOT Vmean:        66.100 cm/s LVOT VTI:          0.235 m LVOT/AV VTI ratio: 0.57  AORTA Ao Root diam: 3.50 cm MITRAL VALVE                TRICUSPID  VALVE MV Area (PHT): 6.12 cm     TR Peak grad:   30.5 mmHg MV Area VTI:   1.85 cm     TR Vmax:        276.00 cm/s MV Peak grad:  5.2 mmHg MV Mean grad:  2.0 mmHg     SHUNTS MV Vmax:       1.14 m/s     Systemic VTI:  0.24 m MV Vmean:      58.4 cm/s    Systemic Diam: 2.10 cm MV Decel Time: 124 msec MV E velocity: 97.70 cm/s MV A velocity: 106.00 cm/s MV E/A ratio:  0.92 Julien Nordmann MD Electronically signed by Julien Nordmann MD Signature Date/Time: 05/20/2023/10:50:03 AM    Final    DG Chest Portable 1 View Result Date: 05/19/2023 CLINICAL DATA:  Hypoxia with syncopal episode. History of metastatic prostate cancer. EXAM: PORTABLE CHEST 1 VIEW COMPARISON:  Radiographs 11/20/2022 and 04/25/2013. Chest CT 09/16/2021. FINDINGS: 0954 hours. The patient's mandible overlies the thoracic inlet. The heart size and mediastinal contours are stable with aortic atherosclerosis. There are low lung volumes without evidence of confluent airspace disease, pleural effusion or pneumothorax. Widespread sclerotic osseous metastatic disease is again noted without evidence of pathologic fracture. Telemetry leads overlie the chest. IMPRESSION: 1. Low lung volumes without evidence of acute cardiopulmonary process. 2. Widespread sclerotic osseous metastatic disease. Electronically Signed   By: Carey Bullocks M.D.   On: 05/19/2023 10:44    Scheduled Meds:  abiraterone acetate  1,000 mg Oral QODAY   enoxaparin (LOVENOX) injection  30 mg Subcutaneous Q24H   QUEtiapine  25 mg Oral QHS   sodium chloride flush  3 mL Intravenous Q12H   Continuous Infusions:   LOS: 2 days    Time spent:   Debarah Crape, DO [facility]  To contact the attending physician between 7A-7P please use Epic Chat. To contact the covering physician during after hours 7P-7A, please review Amion.   05/21/2023, 7:38 AM   *This document has been created with the assistance of dictation software. Please excuse typographical errors. *

## 2023-05-21 NOTE — Plan of Care (Signed)
  Problem: Education: Goal: Knowledge of condition and prescribed therapy will improve Outcome: Progressing   Problem: Cardiac: Goal: Will achieve and/or maintain adequate cardiac output Outcome: Progressing   Problem: Education: Goal: Knowledge of General Education information will improve Description: Including pain rating scale, medication(s)/side effects and non-pharmacologic comfort measures Outcome: Progressing   Problem: Elimination: Goal: Will not experience complications related to urinary retention Outcome: Progressing   Problem: Pain Managment: Goal: General experience of comfort will improve and/or be controlled Outcome: Progressing   Problem: Safety: Goal: Ability to remain free from injury will improve Outcome: Progressing

## 2023-05-21 NOTE — Consult Note (Signed)
[facility] CONSULT NOTE    Date: 05/21/2023                  Patient Name:  Justin Ali  MRN: [id number]  DOB: [birth year]  Age / Sex: 83 y.o., male         PCP: Eustaquio Boyden, MD                 Service Requesting Consult: Hospitalist                 Reason for Consult: Hypercalcemia            History of Present Illness: Patient is a 83 y.o. male with a PMHx of metastatic prostate cancer, hypertension, sinus bradycardia, end-stage dementia, syncope, nonalcoholic fatty liver disease, obstructive sleep apnea, peripheral neuropathy, history of colonic adenomas, restless leg syndrome, history of gastric ulcer, coronary artery disease, who was admitted to [facility] on 05/19/2023 for evaluation of loss of consciousness and bradycardia.  Patient has history of dementia and cannot offer significant history.  He apparently came in with a syncopal event noted at the nursing facility.  He is now found to have significant metabolic derangements.  Serum sodium 147, potassium low at 2.7, BUN 22, creatinine 2.36, and serum calcium 12.9.  He was started on 0.9 normal saline.  He has been on treatment for prostate cancer with Zytiga.   Medications: Outpatient medications: Medications Prior to Admission  Medication Sig Dispense Refill Last Dose/Taking   abiraterone acetate (ZYTIGA) 250 MG tablet Take 1,000 mg by mouth every other day. Take on an empty stomach 1 hour before or 2 hours after a meal   Past Week   Cholecalciferol (VITAMIN D3) 50 MCG (2000 UT) CAPS Take 2,000 Units by mouth every other day.   Past Week   furosemide (LASIX) 20 MG tablet Take 1 tablet (20 mg total) by mouth daily. For leg swelling 90 tablet 3 Past Week   melatonin 5 MG TABS Take 10 mg by mouth at bedtime.   Past Week   Omega-3 Fatty Acids (FISH OIL) 1000 MG CAPS Take 1,000 mg by mouth daily.   Past Week   omeprazole (PRILOSEC) 20 MG capsule Take 1 capsule (20 mg total) by mouth daily. 90 capsule 2  Past Week   polyethylene glycol powder (GLYCOLAX/MIRALAX) 17 GM/SCOOP powder Take 8.5 g by mouth daily as needed for moderate constipation.   Taking As Needed   potassium chloride SA (KLOR-CON M) 10 MEQ tablet Take 1 tablet (10 mEq total) by mouth daily. 90 tablet 3 Past Week   QUEtiapine (SEROQUEL) 25 MG tablet Take 25 mg by mouth at bedtime.   Past Week    Current medications: Current Facility-Administered Medications  Medication Dose Route Frequency Provider Last Rate Last Admin   0.9 %  sodium chloride infusion   Intravenous Continuous Dezii, Alexandra, DO 100 mL/hr at 05/21/23 1709 New Bag at 05/21/23 1709   abiraterone acetate (ZYTIGA) tablet 1,000 mg  1,000 mg Oral QODAY Dezii, Alexandra, DO       enoxaparin (LOVENOX) injection 30 mg  30 mg Subcutaneous Q24H Floydene Flock, MD   30 mg at 05/20/23 2136   hydrALAZINE (APRESOLINE) injection 10 mg  10 mg Intravenous Q6H PRN Dezii, Alexandra, DO   10 mg at 05/21/23 0452   ondansetron (ZOFRAN) tablet 4 mg  4 mg Oral Q6H PRN Floydene Flock, MD       Or   ondansetron [facility])  injection 4 mg  4 mg Intravenous Q6H PRN Floydene Flock, MD       polyethylene glycol powder (GLYCOLAX/MIRALAX) container 8.5 g  8.5 g Oral Daily PRN Dezii, Alexandra, DO       potassium chloride SA (KLOR-CON M) CR tablet 40 mEq  40 mEq Oral Q4H Dezii, Alexandra, DO   40 mEq at 05/21/23 1711   QUEtiapine (SEROQUEL) tablet 25 mg  25 mg Oral QHS Dezii, Alexandra, DO   25 mg at 05/20/23 2136   sodium chloride flush (NS) 0.9 % injection 3 mL  3 mL Intravenous Q12H Floydene Flock, MD   3 mL at 05/21/23 0981   Zoledronic Acid (ZOMETA) IVPB 4 mg  4 mg Intravenous Once Wendee Beavers, NP 400 mL/hr at 05/21/23 1711 4 mg at 05/21/23 1711      Allergies: No Known Allergies    Past Medical 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   2 mm - non-bleeding 03/18/2013 colonoscopy    CAD (coronary artery disease), native  coronary artery 2014   By CT lung (04/2013)    Cataract    Gastric ulcer    HLD (hyperlipidemia)    HTN (hypertension)    Hyperglycemia    NAFLD (nonalcoholic fatty liver disease) 19/1478   by abd [place] with increased LFTs   OSA (obstructive sleep apnea)    did not tolerate CPAP   Other benign neoplasm of connective and other soft tissue of unspecified site    Neurofibroma of lateral periorbital area   Peripheral neuropathy    Personal history of colonic adenomas 08/05/2012   Pneumonia 01/2013   CAP LUL   Restless legs syndrome (RLS)    Sleep apnea    no cpap     Past Surgical History: Past Surgical History:  Procedure Laterality Date   COLONOSCOPY  07/2012   5 adenomas (tubular, TV, serrated), rec rpt 5 months [name])   COLONOSCOPY  02/2013   residual adenomas, cecal AVM, rec rpt 1 yr [name])   COLONOSCOPY  03/2014   no residual polyp, cecal AVM, rpt 3-4 yrs [name])   ECTROPION REPAIR Bilateral 09/29/2017   Procedure: REPAIR OF ECTROPION, EXTENSIVE UPPER AND LOWER;  Surgeon: Imagene Riches, MD;  Location: [facility];  Service: Ophthalmology;  Laterality: Bilateral;   ESOPHAGOGASTRODUODENOSCOPY (EGD) WITH PROPOFOL N/A 01/02/2022   normal esophagus, erosive gastropathy without recent bleed, nonbleeding gastric ulcers and duodenal ulcer with flat pigmented spotWohl, [name], MD)   ORIF ANKLE FRACTURE  11/29/2000   Dr. Katrinka Blazing   PTOSIS REPAIR Bilateral 09/29/2017   Procedure: BLEPHAROPTOSIS REPAIR RESECT EX UPPER;  Surgeon: Imagene Riches, MD;  Location: [facility];  Service: Ophthalmology;  Laterality: Bilateral;   RECONSTRUCTION OF EYELID Bilateral 05/31/2018   SKIN CANCER EXCISION  1997   left anterior neck     Family History: Family History  Problem Relation Age of Onset   Cancer Father        liver   Alcohol abuse Father    Liver cancer Father    Cancer Mother        breast   Breast cancer Mother    Hypertension Brother        Half-brother    Coronary artery disease Paternal Aunt        CABG   Cancer Other        GM; Aunt - breast   Colon cancer Neg Hx    Rectal  cancer Neg Hx    Stomach cancer Neg Hx    Esophageal cancer Neg Hx      Social History: Social History   Socioeconomic History   Marital status: Legally Separated    Spouse name: Not on file   Number of children: 2   Years of education: Not on file   Highest education level: Not on file  Occupational History   Occupation: Truck driver    Comment: Artist  Tobacco Use   Smoking status: Former    Current packs/day: 0.00    Types: Cigarettes, Cigars    Quit date: 04/28/2002    Years since quitting: 21.0   Smokeless tobacco: Never   Tobacco comments:    rare cigar  Vaping Use   Vaping status: Never Used  Substance and Sexual Activity   Alcohol use: Not Currently   Drug use: No   Sexual activity: Not on file  Other Topics Concern   Not on file  Social History Narrative   Caffeine: occasional coffee   Lives with wife, 1 cat   Some care through the [place] - served 8 years in the [employer]   Occupation: truck driver Ambulance person)   Activity: goes to [employer] 3x/wk   Diet: rare water, lots of sweet tea, some fruits/vegetables   Social Drivers of Corporate investment banker Strain: Low Risk  (07/16/2022)   Overall Financial Resource Strain (CARDIA)    Difficulty of Paying Living Expenses: Not hard at all  Food Insecurity: No Food Insecurity (05/19/2023)   Hunger Vital Sign    Worried About Running Out of Food in the Last Year: Never true    Ran Out of Food in the Last Year: Never true  Transportation Needs: No Transportation Needs (05/19/2023)   PRAPARE - Administrator, Civil Service (Medical): No    Lack of Transportation (Non-Medical): No  Physical Activity: Insufficiently Active (07/16/2022)   Exercise Vital Sign    Days of Exercise per Week: 2 days    Minutes of Exercise per Session: 30 min  Stress: Stress Concern Present  (07/16/2022)   [employer] of Occupational Health - Occupational Stress Questionnaire    Feeling of Stress : To some extent  Social Connections: Socially Isolated (05/19/2023)   Social Connection and Isolation Panel [NHANES]    Frequency of Communication with Friends and Family: More than three times a week    Frequency of Social Gatherings with Friends and Family: More than three times a week    Attends Religious Services: Never    Database administrator or Organizations: No    Attends Banker Meetings: Never    Marital Status: Separated  Intimate Partner Violence: Not At Risk (05/19/2023)   Humiliation, Afraid, Rape, and Kick questionnaire    Fear of Current or Ex-Partner: No    Emotionally Abused: No    Physically Abused: No    Sexually Abused: No     Review of Systems: Patient unable to offer history given underlying dementia  Vital Signs: Blood pressure (!) 154/100, pulse 93, temperature 98.6 F (37 C), resp. rate 18, height 5\' 6"  (1.676 m), weight 74 kg, SpO2 97%.  Weight trends: Filed Weights   05/19/23 0911  Weight: 74 kg     Physical Exam: General: No acute distress  Head: Normocephalic, atraumatic. Moist oral mucosal membranes  Eyes: Anicteric  Neck: Supple  Lungs:  Clear to auscultation, normal effort  Heart: S1S2 no rubs  Abdomen:  Soft, nontender, bowel sounds present  Extremities: Trace peripheral edema.  Neurologic: Awake, alert, following commands  Skin: No acute rash  Access: No hemodialysis access    Lab results: Basic Metabolic Panel: Recent Labs  Lab 05/19/23 0923 05/20/23 0309 05/21/23 0823  NA 144 147* 147*  K 2.9* 3.0* 2.7*  CL 102 110 108  CO2 31 29 28   GLUCOSE 127* 95 90  BUN 25* 23 22  CREATININE 2.65* 2.42* 2.36*  CALCIUM 12.6* 12.2* 12.9*    Liver Function Tests: Recent Labs  Lab 05/20/23 0309 05/21/23 0823  AST 15 17  ALT 7 8  ALKPHOS 62 61  BILITOT 1.0 1.2  PROT 5.6* 6.1*  ALBUMIN 3.3* 3.4*   No  results for input(s): "LIPASE", "AMYLASE" in the last 168 hours. No results for input(s): "AMMONIA" in the last 168 hours.  CBC: Recent Labs  Lab 05/19/23 0923 05/20/23 0309 05/21/23 0823  WBC 5.2 4.8 5.4  NEUTROABS 3.5  --  3.6  HGB 11.3* 10.5* 11.3*  HCT 33.7* 30.2* 31.6*  MCV 91.8 90.1 86.6  PLT 149* 144* 151    Cardiac Enzymes: No results for input(s): "CKTOTAL", "CKMB", "CKMBINDEX", "TROPONINI" in the last 168 hours.  BNP: Invalid input(s): "POCBNP"  CBG: Recent Labs  Lab 05/20/23 0528  GLUCAP 86    Microbiology: Results for orders placed or performed during the hospital encounter of 05/19/23  Resp panel by RT-PCR (RSV, Flu A&B, Covid) Anterior Nasal Swab     Status: None   Collection Time: 05/19/23 10:00 AM   Specimen: Anterior Nasal Swab  Result Value Ref Range Status   SARS Coronavirus 2 by RT PCR NEGATIVE NEGATIVE Final    Comment: (NOTE) SARS-CoV-2 target nucleic acids are NOT DETECTED.  The SARS-CoV-2 RNA is generally detectable in upper respiratory specimens during the acute phase of infection. The lowest concentration of SARS-CoV-2 viral copies this assay can detect is 138 copies/mL. A negative result does not preclude SARS-Cov-2 infection and should not be used as the sole basis for treatment or other patient management decisions. A negative result may occur with  improper specimen collection/handling, submission of specimen other than nasopharyngeal swab, presence of viral mutation(s) within the areas targeted by this assay, and inadequate number of viral copies(<138 copies/mL). A negative result must be combined with clinical observations, patient history, and epidemiological information. The expected result is Negative.  Fact Sheet for Patients:  BloggerCourse.com  Fact Sheet for Healthcare Providers:  SeriousBroker.it  This test is no t yet approved or cleared by the [place] FDA and   has been authorized for detection and/or diagnosis of SARS-CoV-2 by FDA under an Emergency Use Authorization (EUA). This EUA will remain  in effect (meaning this test can be used) for the duration of the COVID-19 declaration under Section 564(b)(1) of the Act, 21 U.S.C.section 360bbb-3(b)(1), unless the authorization is terminated  or revoked sooner.       Influenza A by PCR NEGATIVE NEGATIVE Final   Influenza B by PCR NEGATIVE NEGATIVE Final    Comment: (NOTE) The Xpert Xpress SARS-CoV-2/FLU/RSV plus assay is intended as an aid in the diagnosis of influenza from Nasopharyngeal swab specimens and should not be used as a sole basis for treatment. Nasal washings and aspirates are unacceptable for Xpert Xpress SARS-CoV-2/FLU/RSV testing.  Fact Sheet for Patients: BloggerCourse.com  Fact Sheet for Healthcare Providers: SeriousBroker.it  This test is not yet approved or cleared by the [place] FDA and has been authorized for detection and/or  diagnosis of SARS-CoV-2 by FDA under an Emergency Use Authorization (EUA). This EUA will remain in effect (meaning this test can be used) for the duration of the COVID-19 declaration under Section 564(b)(1) of the Act, 21 U.S.C. section 360bbb-3(b)(1), unless the authorization is terminated or revoked.     Resp Syncytial Virus by PCR NEGATIVE NEGATIVE Final    Comment: (NOTE) Fact Sheet for Patients: BloggerCourse.com  Fact Sheet for Healthcare Providers: SeriousBroker.it  This test is not yet approved or cleared by the [place] FDA and has been authorized for detection and/or diagnosis of SARS-CoV-2 by FDA under an Emergency Use Authorization (EUA). This EUA will remain in effect (meaning this test can be used) for the duration of the COVID-19 declaration under Section 564(b)(1) of the Act, 21 U.S.C. section 360bbb-3(b)(1),  unless the authorization is terminated or revoked.  Performed at [facility], [address] [place], [place] [address]     Coagulation Studies: No results for input(s): "LABPROT", "INR" in the last 72 hours.  Urinalysis: No results for input(s): "COLORURINE", "LABSPEC", "PHURINE", "GLUCOSEU", "HGBUR", "BILIRUBINUR", "KETONESUR", "PROTEINUR", "UROBILINOGEN", "NITRITE", "LEUKOCYTESUR" in the last 72 hours.  Invalid input(s): "APPERANCEUR"    Imaging: ECHOCARDIOGRAM COMPLETE Result Date: 05/20/2023    ECHOCARDIOGRAM REPORT   Patient Name:   AUSENCIO DWYER Date of Exam: 05/20/2023 Medical Rec #:  [id number]        Height:       66.0 in Accession #:    [id number]       Weight:       163.1 lb Date of Birth:  1941/01/09        BSA:          1.834 m Patient Age:    82 years         BP:           140/67 mmHg Patient Gender: M                HR:           60 bpm. Exam Location:  [facility] Procedure: 2D Echo, Cardiac Doppler and Color Doppler Indications:     Syncope  History:         Patient has no prior history of Echocardiogram examinations.                  CAD, Arrythmias:Bradycardia, Signs/Symptoms:Syncope; Risk                  Factors:Hypertension, Dyslipidemia and Sleep Apnea. CKD.  Sonographer:     Mikki Harbor Referring Phys:  [phone] Francoise Schaumann NEWTON Diagnosing Phys: Julien Nordmann MD  Sonographer Comments: Technically difficult study due to poor echo windows. Image acquisition challenging due to respiratory motion. IMPRESSIONS  1. Left ventricular ejection fraction, by estimation, is 55 to 60%. The left ventricle has normal function. The left ventricle has no regional wall motion abnormalities. Left ventricular diastolic parameters are indeterminate.  2. Right ventricular systolic function is normal. The right ventricular size is normal. There is normal pulmonary artery systolic pressure. The estimated right ventricular systolic pressure is 33.5 mmHg.  3. Left atrial size was mildly  dilated.  4. The mitral valve is normal in structure. Mild mitral valve regurgitation. No evidence of mitral stenosis.  5. The aortic valve is normal in structure. There is moderate calcification of the aortic valve. Aortic valve regurgitation is not visualized. Aortic valve sclerosis/calcification is present, without any evidence of aortic stenosis. Aortic valve mean gradient  measures 7.7 mmHg.  6. The inferior vena cava is normal in size with greater than 50% respiratory variability, suggesting right atrial pressure of 3 mmHg. FINDINGS  Left Ventricle: Left ventricular ejection fraction, by estimation, is 55 to 60%. The left ventricle has normal function. The left ventricle has no regional wall motion abnormalities. The left ventricular internal cavity size was normal in size. There is  no left ventricular hypertrophy. Left ventricular diastolic parameters are indeterminate. Right Ventricle: The right ventricular size is normal. No increase in right ventricular wall thickness. Right ventricular systolic function is normal. There is normal pulmonary artery systolic pressure. The tricuspid regurgitant velocity is 2.76 m/s, and  with an assumed right atrial pressure of 3 mmHg, the estimated right ventricular systolic pressure is 33.5 mmHg. Left Atrium: Left atrial size was mildly dilated. Right Atrium: Right atrial size was normal in size. Pericardium: There is no evidence of pericardial effusion. Mitral Valve: The mitral valve is normal in structure. Mild mitral valve regurgitation. No evidence of mitral valve stenosis. MV peak gradient, 5.2 mmHg. The mean mitral valve gradient is 2.0 mmHg. Tricuspid Valve: The tricuspid valve is normal in structure. Tricuspid valve regurgitation is mild . No evidence of tricuspid stenosis. Aortic Valve: The aortic valve is normal in structure. There is moderate calcification of the aortic valve. Aortic valve regurgitation is not visualized. Aortic valve sclerosis/calcification is  present, without any evidence of aortic stenosis. Aortic valve mean gradient measures 7.7 mmHg. Aortic valve peak gradient measures 16.2 mmHg. Aortic valve area, by VTI measures 1.96 cm. Pulmonic Valve: The pulmonic valve was normal in structure. Pulmonic valve regurgitation is not visualized. No evidence of pulmonic stenosis. Aorta: The aortic root is normal in size and structure. Venous: The inferior vena cava is normal in size with greater than 50% respiratory variability, suggesting right atrial pressure of 3 mmHg. IAS/Shunts: No atrial level shunt detected by color flow Doppler.  LEFT VENTRICLE PLAX 2D LVIDd:         4.50 cm   Diastology LVIDs:         3.00 cm   LV e' medial:   7.83 cm/s LV PW:         1.20 cm   LV E/e' medial: 12.5 LV IVS:        1.30 cm LVOT diam:     2.10 cm LV SV:         81 LV SV Index:   44 LVOT Area:     3.46 cm  RIGHT VENTRICLE RV Basal diam:  3.45 cm RV Mid diam:    3.20 cm RV S prime:     16.90 cm/s TAPSE (M-mode): 3.0 cm LEFT ATRIUM             Index        RIGHT ATRIUM           Index LA diam:        3.40 cm 1.85 cm/m   RA Area:     18.40 cm LA Vol (A2C):   70.4 ml 38.39 ml/m  RA Volume:   48.50 ml  26.45 ml/m LA Vol (A4C):   50.5 ml 27.54 ml/m LA Biplane Vol: 64.0 ml 34.90 ml/m  AORTIC VALVE                     PULMONIC VALVE AV Area (Vmax):    1.77 cm      PV Vmax:       0.94  m/s AV Area (Vmean):   1.88 cm      PV Peak grad:  3.5 mmHg AV Area (VTI):     1.96 cm AV Vmax:           201.33 cm/s AV Vmean:          121.667 cm/s AV VTI:            0.415 m AV Peak Grad:      16.2 mmHg AV Mean Grad:      7.7 mmHg LVOT Vmax:         103.00 cm/s LVOT Vmean:        66.100 cm/s LVOT VTI:          0.235 m LVOT/AV VTI ratio: 0.57  AORTA Ao Root diam: 3.50 cm MITRAL VALVE                TRICUSPID VALVE MV Area (PHT): 6.12 cm     TR Peak grad:   30.5 mmHg MV Area VTI:   1.85 cm     TR Vmax:        276.00 cm/s MV Peak grad:  5.2 mmHg MV Mean grad:  2.0 mmHg     SHUNTS MV Vmax:        1.14 m/s     Systemic VTI:  0.24 m MV Vmean:      58.4 cm/s    Systemic Diam: 2.10 cm MV Decel Time: 124 msec MV E velocity: 97.70 cm/s MV A velocity: 106.00 cm/s MV E/A ratio:  0.92 Julien Nordmann MD Electronically signed by Julien Nordmann MD Signature Date/Time: 05/20/2023/10:50:03 AM    Final      Assessment & Plan: Pt is a 83 y.o. male with a PMHx of metastatic prostate cancer, hypertension, sinus bradycardia, end-stage dementia, syncope, nonalcoholic fatty liver disease, obstructive sleep apnea, peripheral neuropathy, history of colonic adenomas, restless leg syndrome, history of gastric ulcer, coronary artery disease, who was admitted to [facility] on 05/19/2023 for evaluation of loss of consciousness and bradycardia.  1.  Hypercalcemia likely secondary to metastatic prostate cancer. 2.  Acute kidney injury. 3.  Chronic kidney disease stage IIIa baseline creatinine 1.4, EGFR 50 4.  Metastatic prostate cancer. 5.  Hyponatremia serum sodium 147. 6.  Hypokalemia serum potassium 2.7.  Plan: Patient presents with significant hypercalcemia with a calcium of 12.9.  He has been provided with normal saline at 100 cc/h.  This may temporarily worsen his hyponatremia.  We do not have recent imaging here to determine the extent of his underlying prostate cancer.  We will proceed with zoledronic acid 4 mg IV x 1.  Hopefully this should have a rapid effect on his hypercalcemia.  Recommend obtaining input from hematology/oncology as well.  Will also obtain multiple myeloma panel.  Further plan as patient progresses.

## 2023-05-21 NOTE — TOC Initial Note (Signed)
Transition of Care [facility]) - Initial/Assessment Note    Patient Details  Name: Justin Ali MRN: [id number] Date of Birth: 07/06/1940  Transition of Care [facility]) CM/SW Contact:    Allena Katz, LCSW Phone Number: 05/21/2023, 12:05 PM  Clinical Narrative:                  Pt admitted from [facility]. Pt currently Disoriented X4. Message left with son to discuss post discharge needs.  Expected Discharge Plan: Home w Home Health Services Barriers to Discharge: Continued Medical Work up   Patient Goals and CMS Choice Patient states their goals for this hospitalization and ongoing recovery are:: return to [facility]   Choice offered to / list presented to : Adult Children      Expected Discharge Plan and Services                                              Prior Living Arrangements/Services   Lives with:: Facility Resident          Need for Family Participation in Patient Care: Yes (Comment)        Activities of Daily Living   ADL Screening (condition at time of admission) Independently performs ADLs?: No Does the patient have a NEW difficulty with bathing/dressing/toileting/self-feeding that is expected to last >3 days?: No Does the patient have a NEW difficulty with getting in/out of bed, walking, or climbing stairs that is expected to last >3 days?: No Does the patient have a NEW difficulty with communication that is expected to last >3 days?: No Is the patient deaf or have difficulty hearing?: No Does the patient have difficulty seeing, even when wearing glasses/contacts?: No Does the patient have difficulty concentrating, remembering, or making decisions?: No  Permission Sought/Granted                  Emotional Assessment       Orientation: : Fluctuating Orientation (Suspected and/or reported Sundowners)      Admission diagnosis:  Syncope [R55] AKI (acute kidney injury) (HCC) [N17.9] Syncope, unspecified syncope type  [R55] Patient Active Problem List   Diagnosis Date Noted   Syncope 05/19/2023   Encephalopathy 05/19/2023   Sinus bradycardia 05/19/2023   Chronic pain syndrome 12/21/2022   Sore on leg 11/05/2022   Pedal edema 10/13/2022   Constipation 10/13/2022   Positive RPR test 08/22/2022   Cognitive and behavioral changes 03/18/2022   Memory deficit 03/15/2022   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   History of GI bleed 01/02/2022   Chronic kidney disease, stage 3a (HCC)    History of gastric ulcer    Low serum vitamin B12 07/10/2021   Bilateral flank pain 11/21/2020   Tinea cruris 07/13/2020   Choroidal lesion 08/19/2019   Combined forms of age-related cataract of both eyes 08/19/2019   DNR (do not resuscitate) 07/04/2019   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   Metastasis from malignant neoplasm of prostate (HCC) 06/24/2019   Nuclear sclerosis of both eyes 06/24/2019   Laxity of eyelid 01/16/2018   Chronic insomnia 01/02/2017   Primary malignant 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 (HCC) 09/24/2016   Benign prostatic hyperplasia 06/12/2015   Advanced care planning/counseling discussion 06/07/2014   Unintentional weight loss 06/07/2014   Hearing loss due to cerumen impaction  06/07/2014   CAD (coronary artery disease), native coronary artery 06/02/2013   Angiodysplasia of cecum 03/18/2013   History of colonic polyps 08/05/2012   Medicare annual wellness visit, subsequent 06/02/2012   Health maintenance examination 06/02/2011   Fatty liver 06/02/2011   HYPERTENSION, BENIGN ESSENTIAL 11/02/2006   Dyslipidemia 10/27/2006   RESTLESS LEG SYNDROME 10/27/2006   Hereditary and idiopathic peripheral neuropathy 10/27/2006   Sleep apnea 10/27/2006   PCP:  Eustaquio Boyden, MD Pharmacy:   [facility] - [place], [place] - [address] [place] [address] Phone: [phone] Fax:  [phone]  [facility] - [place], [place] - [address] [place] [address] Phone: [phone] Fax: [phone]     Social Drivers of Health (SDOH) Social History: SDOH Screenings   Food Insecurity: No Food Insecurity (05/19/2023)  Housing: Low Risk  (05/19/2023)  Transportation Needs: No Transportation Needs (05/19/2023)  Utilities: Not At Risk (05/19/2023)  Alcohol Screen: Low Risk  (07/16/2022)  Depression (PHQ2-9): Low Risk  (03/13/2023)  Recent Concern: Depression (PHQ2-9) - High Risk (01/16/2023)  Financial Resource Strain: Low Risk  (07/16/2022)  Physical Activity: Insufficiently Active (07/16/2022)  Social Connections: Socially Isolated (05/19/2023)  Stress: Stress Concern Present (07/16/2022)  Tobacco Use: Medium Risk (05/19/2023)   SDOH Interventions:     Readmission Risk Interventions     No data to display

## 2023-05-22 ENCOUNTER — Encounter: Payer: Self-pay | Admitting: Family Medicine

## 2023-05-22 DIAGNOSIS — N179 Acute kidney failure, unspecified: Secondary | ICD-10-CM | POA: Diagnosis not present

## 2023-05-22 DIAGNOSIS — E870 Hyperosmolality and hypernatremia: Secondary | ICD-10-CM

## 2023-05-22 DIAGNOSIS — E876 Hypokalemia: Secondary | ICD-10-CM

## 2023-05-22 DIAGNOSIS — C61 Malignant neoplasm of prostate: Secondary | ICD-10-CM | POA: Diagnosis not present

## 2023-05-22 DIAGNOSIS — G9340 Encephalopathy, unspecified: Secondary | ICD-10-CM

## 2023-05-22 DIAGNOSIS — R001 Bradycardia, unspecified: Secondary | ICD-10-CM | POA: Diagnosis not present

## 2023-05-22 LAB — COMPREHENSIVE METABOLIC PANEL
ALT: 9 U/L (ref 0–44)
AST: 17 U/L (ref 15–41)
Albumin: 3.4 g/dL — ABNORMAL LOW (ref 3.5–5.0)
Alkaline Phosphatase: 65 U/L (ref 38–126)
Anion gap: 11 (ref 5–15)
BUN: 25 mg/dL — ABNORMAL HIGH (ref 8–23)
CO2: 28 mmol/L (ref 22–32)
Calcium: 12.8 mg/dL — ABNORMAL HIGH (ref 8.9–10.3)
Chloride: 112 mmol/L — ABNORMAL HIGH (ref 98–111)
Creatinine, Ser: 2.44 mg/dL — ABNORMAL HIGH (ref 0.61–1.24)
GFR, Estimated: 26 mL/min — ABNORMAL LOW (ref >60)
Glucose, Bld: 123 mg/dL — ABNORMAL HIGH (ref 70–99)
Potassium: 2.9 mmol/L — ABNORMAL LOW (ref 3.5–5.1)
Sodium: 151 mmol/L — ABNORMAL HIGH (ref 135–145)
Total Bilirubin: 1 mg/dL (ref 0.0–1.2)
Total Protein: 6.1 g/dL — ABNORMAL LOW (ref 6.5–8.1)

## 2023-05-22 LAB — CBC WITH DIFFERENTIAL/PLATELET
Abs Immature Granulocytes: 0.03 10*3/uL (ref 0.00–0.07)
Basophils Absolute: 0 10*3/uL (ref 0.0–0.1)
Basophils Relative: 0 %
Eosinophils Absolute: 0 10*3/uL (ref 0.0–0.5)
Eosinophils Relative: 0 %
HCT: 32.2 % — ABNORMAL LOW (ref 39.0–52.0)
Hemoglobin: 11 g/dL — ABNORMAL LOW (ref 13.0–17.0)
Immature Granulocytes: 1 %
Lymphocytes Relative: 10 %
Lymphs Abs: 0.6 10*3/uL — ABNORMAL LOW (ref 0.7–4.0)
MCH: 30.6 pg (ref 26.0–34.0)
MCHC: 34.2 g/dL (ref 30.0–36.0)
MCV: 89.7 fL (ref 80.0–100.0)
Monocytes Absolute: 0.4 10*3/uL (ref 0.1–1.0)
Monocytes Relative: 8 %
Neutro Abs: 4.5 10*3/uL (ref 1.7–7.7)
Neutrophils Relative %: 81 %
Platelets: 152 10*3/uL (ref 150–400)
RBC: 3.59 MIL/uL — ABNORMAL LOW (ref 4.22–5.81)
RDW: 13 % (ref 11.5–15.5)
WBC: 5.6 10*3/uL (ref 4.0–10.5)
nRBC: 0 % (ref 0.0–0.2)

## 2023-05-22 LAB — PTH, INTACT AND CALCIUM
Calcium, Total (PTH): 13.5 mg/dL (ref 8.6–10.2)
PTH: 8 pg/mL — ABNORMAL LOW (ref 15–65)

## 2023-05-22 LAB — GLUCOSE, CAPILLARY
Glucose-Capillary: 108 mg/dL — ABNORMAL HIGH (ref 70–99)
Glucose-Capillary: 115 mg/dL — ABNORMAL HIGH (ref 70–99)

## 2023-05-22 MED ORDER — ACETAMINOPHEN 325 MG PO TABS
650.0000 mg | ORAL_TABLET | Freq: Four times a day (QID) | ORAL | Status: DC | PRN
  Administered 2023-05-22 – 2023-05-29 (×4): 650 mg via ORAL
  Filled 2023-05-22 (×5): qty 2

## 2023-05-22 MED ORDER — DEXTROSE 5 % IV SOLN: INTRAVENOUS | Status: DC

## 2023-05-22 MED ORDER — MORPHINE SULFATE (PF) 2 MG/ML IV SOLN
1.0000 mg | Freq: Once | INTRAVENOUS | Status: AC
  Administered 2023-05-22: 1 mg via INTRAVENOUS
  Filled 2023-05-22: qty 1

## 2023-05-22 MED ORDER — AMLODIPINE BESYLATE 5 MG PO TABS
5.0000 mg | ORAL_TABLET | Freq: Every day | ORAL | Status: DC
  Administered 2023-05-22 – 2023-06-03 (×10): 5 mg via ORAL
  Filled 2023-05-22 (×12): qty 1

## 2023-05-22 MED ORDER — LABETALOL HCL 5 MG/ML IV SOLN
10.0000 mg | INTRAVENOUS | Status: DC | PRN
  Administered 2023-05-22: 10 mg via INTRAVENOUS
  Filled 2023-05-22: qty 4

## 2023-05-22 MED ORDER — POTASSIUM CHLORIDE 10 MEQ/100ML IV SOLN
10.0000 meq | INTRAVENOUS | Status: AC
  Administered 2023-05-22 (×6): 10 meq via INTRAVENOUS
  Filled 2023-05-22 (×6): qty 100

## 2023-05-22 NOTE — Plan of Care (Signed)
  Problem: Cardiac: Goal: Will achieve and/or maintain adequate cardiac output Outcome: Progressing   Problem: Nutrition: Goal: Adequate nutrition will be maintained Outcome: Progressing   Problem: Coping: Goal: Level of anxiety will decrease Outcome: Progressing   Problem: Elimination: Goal: Will not experience complications related to urinary retention Outcome: Progressing   Problem: Pain Managment: Goal: General experience of comfort will improve and/or be controlled Outcome: Progressing

## 2023-05-22 NOTE — Consult Note (Signed)
[facility]  [phone] [phone]  ID: [name] OB: 1940/08/08  MR#: [id number]  UYQ#:[id number]  Patient Care Team: Eustaquio Boyden, MD as PCP - General (Family Medicine) Galen Manila, MD as Referring Physician (Ophthalmology) Jeralyn Ruths, MD as Consulting Physician (Oncology)  REFERRING PROVIDER: Dr. Rennis Chris  REASON FOR REFERRAL: Hypercalcemia.  History of stage IV prostate cancer  ASSESSMENT AND PLAN:   VENCIL BASNETT is a 83 y.o. 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 Orlie Dakin in March 2024 then patient transferred care to [facility] oncology presented [facility] ED on 05/19/2023 with concern for syncopal episode in his independent living facility.  He was found slumped over on the dining table.  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 Albumin 3.4  He was on IV normal saline and received 1 dose of IV Zometa 4 mg on 05/21/2023 per nephrology.  -Today, sodium has increased to 151 and he started on D5W.  Calcium today is 12.8.  Continue to monitor calcium closely.  If calcium level does not trend down in next 24 to 48 hours, can consider subcutaneous calcitonin 4 units/kg every 12 hour.  Limit total duration of therapy to 24 to 48 hours due to tachyphylaxis.  # Hypokalemia # Hypernatremia -Could be secondary to poor p.o. intake.  Hypernatremia could be iatrogenic from IV normal saline infusion.  Cannot rule out electrolyte imbalances from Zytiga.  Of chart, patient was taking Zytiga without prednisone (not sure for how long) which can increase the risk of adrenocortical insufficiency.  Limited history patient has dementia.  No records from [place].    -Consider holding off on Zytiga while hospitalized and electrolyte imbalances are corrected.  Can resume on discharge.  Will  continue to follow the patient.  Patient expressed understanding and was in agreement with this plan. He also understands that He can call clinic at any time with any questions, concerns, or complaints.   I spent a total of 60 minutes reviewing chart data, face-to-face evaluation with the patient, counseling and coordination of care as detailed above.  HPI: Justin Ali is a 83 y.o. 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 Orlie Dakin in March 2024 then patient transferred care to [facility] oncology presented to [facility] ED on 05/19/2023 with concern for syncopal episode in his independent living facility.  He was found slumped over on the dining table.  On admission, WBC 5.2, hemoglobin 9.3, platelet 149.  COVID/flu/RSV negative.  Calcium 12.6.  Creatinine 2.65.  Last creatinine from July 2024 1.4.  He was started on IV normal saline which was later discontinued with worsening of hyponatremia.  Sodium today 151.  Also has hypokalemia with potassium of 2.9 today.  Creatinine 2.44.  Calcium today is 12.9.  Albumin 3.4.  Patient was seen at bedside.  He does have baseline dementia.  Unable to tell me why he is here or provide any history of his prostate cancer.  Unable to reach son.  He follows with oncology at [facility].  I do not have any records of most recent follow-up visit.  --- Last evaluated by Dr. Orlie Dakin on 06/27/2022.  Of chart, he has history of prostate biopsy which revealed Gleason score 8 (4+4) prostate adenocarcinoma.  CT imaging from 2023 showing extensive osseous  metastatic disease.    REVIEW OF SYSTEMS:   ROS  As per HPI. Otherwise, a complete review of systems is negative.  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   2 mm - non-bleeding 03/18/2013 colonoscopy    CAD (coronary artery disease), native coronary artery 2014   By CT lung (04/2013)     Cataract    Gastric ulcer    HLD (hyperlipidemia)    HTN (hypertension)    Hyperglycemia    NAFLD (nonalcoholic fatty liver disease) 16/1096   by abd [place] with increased LFTs   OSA (obstructive sleep apnea)    did not tolerate CPAP   Other benign neoplasm of connective and other soft tissue of unspecified site    Neurofibroma of lateral periorbital area   Peripheral neuropathy    Personal history of colonic adenomas 08/05/2012   Pneumonia 01/2013   CAP LUL   Restless legs syndrome (RLS)    Sleep apnea    no cpap    PAST SURGICAL HISTORY: Past Surgical History:  Procedure Laterality Date   COLONOSCOPY  07/2012   5 adenomas (tubular, TV, serrated), rec rpt 5 months [name])   COLONOSCOPY  02/2013   residual adenomas, cecal AVM, rec rpt 1 yr [name])   COLONOSCOPY  03/2014   no residual polyp, cecal AVM, rpt 3-4 yrs [name])   ECTROPION REPAIR Bilateral 09/29/2017   Procedure: REPAIR OF ECTROPION, EXTENSIVE UPPER AND LOWER;  Surgeon: Imagene Riches, MD;  Location: [facility];  Service: Ophthalmology;  Laterality: Bilateral;   ESOPHAGOGASTRODUODENOSCOPY (EGD) WITH PROPOFOL N/A 01/02/2022   normal esophagus, erosive gastropathy without recent bleed, nonbleeding gastric ulcers and duodenal ulcer with flat pigmented spotWohl, [name], MD)   ORIF ANKLE FRACTURE  11/29/2000   Dr. Katrinka Blazing   PTOSIS REPAIR Bilateral 09/29/2017   Procedure: BLEPHAROPTOSIS REPAIR RESECT EX UPPER;  Surgeon: Imagene Riches, MD;  Location: [facility];  Service: Ophthalmology;  Laterality: Bilateral;   RECONSTRUCTION OF EYELID Bilateral 05/31/2018   SKIN CANCER EXCISION  1997   left anterior neck    FAMILY HISTORY: Family History  Problem Relation Age of Onset   Cancer Father        liver   Alcohol abuse Father    Liver cancer Father    Cancer Mother        breast   Breast cancer Mother    Hypertension Brother        Half-brother   Coronary artery disease Paternal Aunt        CABG    Cancer Other        GM; Aunt - breast   Colon cancer Neg Hx    Rectal cancer Neg Hx    Stomach cancer Neg Hx    Esophageal cancer Neg Hx     HEALTH MAINTENANCE: Social History   Tobacco Use   Smoking status: Former    Current packs/day: 0.00    Types: Cigarettes, Cigars    Quit date: 04/28/2002    Years since quitting: 21.0   Smokeless tobacco: Never   Tobacco comments:    rare cigar  Vaping Use   Vaping status: Never Used  Substance Use Topics   Alcohol use: Not Currently   Drug use: No     No Known Allergies  Current Facility-Administered Medications  Medication Dose Route Frequency Provider Last Rate Last Admin   abiraterone acetate (ZYTIGA) tablet 1,000 mg  1,000 mg Oral  QODAY Dezii, Alexandra, DO       dextrose 5 % solution   Intravenous Continuous Dezii, Alexandra, DO 100 mL/hr at 05/22/23 1146 New Bag at 05/22/23 1146   enoxaparin (LOVENOX) injection 30 mg  30 mg Subcutaneous Q24H Floydene Flock, MD   30 mg at 05/21/23 2104   hydrALAZINE (APRESOLINE) injection 10 mg  10 mg Intravenous Q6H PRN Dezii, Gordy Councilman, DO   10 mg at 05/21/23 2036   ondansetron (ZOFRAN) tablet 4 mg  4 mg Oral Q6H PRN Floydene Flock, MD       Or   ondansetron [facility]) injection 4 mg  4 mg Intravenous Q6H PRN Floydene Flock, MD   4 mg at 05/22/23 0225   polyethylene glycol powder (GLYCOLAX/MIRALAX) container 8.5 g  8.5 g Oral Daily PRN Dezii, Alexandra, DO       potassium chloride 10 mEq in 100 mL IVPB  10 mEq Intravenous Q1 Hr x 6 Dezii, Alexandra, DO 100 mL/hr at 05/22/23 1147 10 mEq at 05/22/23 1147   QUEtiapine (SEROQUEL) tablet 25 mg  25 mg Oral QHS Dezii, Alexandra, DO   25 mg at 05/21/23 2104   sodium chloride flush (NS) 0.9 % injection 3 mL  3 mL Intravenous Q12H Floydene Flock, MD   3 mL at 05/22/23 1008    OBJECTIVE: Vitals:   05/22/23 0809 05/22/23 1101  BP: (!) 158/91 (!) 173/105  Pulse: (!) 102 (!) 124  Resp: 18 20  Temp: 99.7 F (37.6 C) 98.8 F (37.1 C)  SpO2:  92% 91%     Body mass index is 25.55 kg/m.      General: Well-developed, well-nourished, no acute distress. Eyes: Pink conjunctiva, anicteric sclera. HEENT: Normocephalic, moist mucous membranes, clear oropharnyx. Lungs: Clear to auscultation bilaterally. Heart: Regular rate and rhythm. No rubs, murmurs, or gallops. Abdomen: Soft, nontender, nondistended. No organomegaly noted, normoactive bowel sounds. Musculoskeletal: No edema, cyanosis, or clubbing. Neuro: Alert, answering all questions appropriately. Cranial nerves grossly intact. Skin: No rashes or petechiae noted. Psych: Normal affect. Lymphatics: No cervical, calvicular, axillary or inguinal LAD.   LAB RESULTS:  Lab Results  Component Value Date   NA 151 (H) 05/22/2023   K 2.9 (L) 05/22/2023   CL 112 (H) 05/22/2023   CO2 28 05/22/2023   GLUCOSE 123 (H) 05/22/2023   BUN 25 (H) 05/22/2023   CREATININE 2.44 (H) 05/22/2023   CALCIUM 12.8 (H) 05/22/2023   PROT 6.1 (L) 05/22/2023   ALBUMIN 3.4 (L) 05/22/2023   AST 17 05/22/2023   ALT 9 05/22/2023   ALKPHOS 65 05/22/2023   BILITOT 1.0 05/22/2023   GFRNONAA 26 (L) 05/22/2023   GFRAA >60 12/13/2019    Lab Results  Component Value Date   WBC 5.6 05/22/2023   NEUTROABS 4.5 05/22/2023   HGB 11.0 (L) 05/22/2023   HCT 32.2 (L) 05/22/2023   MCV 89.7 05/22/2023   PLT 152 05/22/2023    Lab Results  Component Value Date   TIBC 330.4 01/29/2022   TIBC 335 03/26/2021   FERRITIN 24.0 01/29/2022   FERRITIN 44 03/26/2021   FERRITIN 40.1 07/05/2020   IRONPCTSAT 19.1 (L) 01/29/2022   IRONPCTSAT 29 03/26/2021   IRONPCTSAT 26.4 07/05/2020     STUDIES: ECHOCARDIOGRAM COMPLETE Result Date: 05/20/2023    ECHOCARDIOGRAM REPORT   Patient Name:   GRAVIEL PAYEUR Date of Exam: 05/20/2023 Medical Rec #:  [id number]        Height:       66.0  in Accession #:    [id number]       Weight:       163.1 lb Date of Birth:  [birth year]        BSA:          1.834 m Patient Age:    82 years          BP:           140/67 mmHg Patient Gender: M                HR:           60 bpm. Exam Location:  [facility] Procedure: 2D Echo, Cardiac Doppler and Color Doppler Indications:     Syncope  History:         Patient has no prior history of Echocardiogram examinations.                  CAD, Arrythmias:Bradycardia, Signs/Symptoms:Syncope; Risk                  Factors:Hypertension, Dyslipidemia and Sleep Apnea. CKD.  Sonographer:     Mikki Harbor Referring Phys:  [phone] Francoise Schaumann NEWTON Diagnosing Phys: Julien Nordmann MD  Sonographer Comments: Technically difficult study due to poor echo windows. Image acquisition challenging due to respiratory motion. IMPRESSIONS  1. Left ventricular ejection fraction, by estimation, is 55 to 60%. The left ventricle has normal function. The left ventricle has no regional wall motion abnormalities. Left ventricular diastolic parameters are indeterminate.  2. Right ventricular systolic function is normal. The right ventricular size is normal. There is normal pulmonary artery systolic pressure. The estimated right ventricular systolic pressure is 33.5 mmHg.  3. Left atrial size was mildly dilated.  4. The mitral valve is normal in structure. Mild mitral valve regurgitation. No evidence of mitral stenosis.  5. The aortic valve is normal in structure. There is moderate calcification of the aortic valve. Aortic valve regurgitation is not visualized. Aortic valve sclerosis/calcification is present, without any evidence of aortic stenosis. Aortic valve mean gradient measures 7.7 mmHg.  6. The inferior vena cava is normal in size with greater than 50% respiratory variability, suggesting right atrial pressure of 3 mmHg. FINDINGS  Left Ventricle: Left ventricular ejection fraction, by estimation, is 55 to 60%. The left ventricle has normal function. The left ventricle has no regional wall motion abnormalities. The left ventricular internal cavity size was normal in size. There is  no left  ventricular hypertrophy. Left ventricular diastolic parameters are indeterminate. Right Ventricle: The right ventricular size is normal. No increase in right ventricular wall thickness. Right ventricular systolic function is normal. There is normal pulmonary artery systolic pressure. The tricuspid regurgitant velocity is 2.76 m/s, and  with an assumed right atrial pressure of 3 mmHg, the estimated right ventricular systolic pressure is 33.5 mmHg. Left Atrium: Left atrial size was mildly dilated. Right Atrium: Right atrial size was normal in size. Pericardium: There is no evidence of pericardial effusion. Mitral Valve: The mitral valve is normal in structure. Mild mitral valve regurgitation. No evidence of mitral valve stenosis. MV peak gradient, 5.2 mmHg. The mean mitral valve gradient is 2.0 mmHg. Tricuspid Valve: The tricuspid valve is normal in structure. Tricuspid valve regurgitation is mild . No evidence of tricuspid stenosis. Aortic Valve: The aortic valve is normal in structure. There is moderate calcification of the aortic valve. Aortic valve regurgitation is not visualized. Aortic valve sclerosis/calcification is present, without any evidence of aortic stenosis. Aortic valve mean  gradient measures 7.7 mmHg. Aortic valve peak gradient measures 16.2 mmHg. Aortic valve area, by VTI measures 1.96 cm. Pulmonic Valve: The pulmonic valve was normal in structure. Pulmonic valve regurgitation is not visualized. No evidence of pulmonic stenosis. Aorta: The aortic root is normal in size and structure. Venous: The inferior vena cava is normal in size with greater than 50% respiratory variability, suggesting right atrial pressure of 3 mmHg. IAS/Shunts: No atrial level shunt detected by color flow Doppler.  LEFT VENTRICLE PLAX 2D LVIDd:         4.50 cm   Diastology LVIDs:         3.00 cm   LV e' medial:   7.83 cm/s LV PW:         1.20 cm   LV E/e' medial: 12.5 LV IVS:        1.30 cm LVOT diam:     2.10 cm LV SV:          81 LV SV Index:   44 LVOT Area:     3.46 cm  RIGHT VENTRICLE RV Basal diam:  3.45 cm RV Mid diam:    3.20 cm RV S prime:     16.90 cm/s TAPSE (M-mode): 3.0 cm LEFT ATRIUM             Index        RIGHT ATRIUM           Index LA diam:        3.40 cm 1.85 cm/m   RA Area:     18.40 cm LA Vol (A2C):   70.4 ml 38.39 ml/m  RA Volume:   48.50 ml  26.45 ml/m LA Vol (A4C):   50.5 ml 27.54 ml/m LA Biplane Vol: 64.0 ml 34.90 ml/m  AORTIC VALVE                     PULMONIC VALVE AV Area (Vmax):    1.77 cm      PV Vmax:       0.94 m/s AV Area (Vmean):   1.88 cm      PV Peak grad:  3.5 mmHg AV Area (VTI):     1.96 cm AV Vmax:           201.33 cm/s AV Vmean:          121.667 cm/s AV VTI:            0.415 m AV Peak Grad:      16.2 mmHg AV Mean Grad:      7.7 mmHg LVOT Vmax:         103.00 cm/s LVOT Vmean:        66.100 cm/s LVOT VTI:          0.235 m LVOT/AV VTI ratio: 0.57  AORTA Ao Root diam: 3.50 cm MITRAL VALVE                TRICUSPID VALVE MV Area (PHT): 6.12 cm     TR Peak grad:   30.5 mmHg MV Area VTI:   1.85 cm     TR Vmax:        276.00 cm/s MV Peak grad:  5.2 mmHg MV Mean grad:  2.0 mmHg     SHUNTS MV Vmax:       1.14 m/s     Systemic VTI:  0.24 m MV Vmean:      58.4 cm/s    Systemic Diam: 2.10 cm MV  Decel Time: 124 msec MV E velocity: 97.70 cm/s MV A velocity: 106.00 cm/s MV E/A ratio:  0.92 Julien Nordmann MD Electronically signed by Julien Nordmann MD Signature Date/Time: 05/20/2023/10:50:03 AM    Final    DG Chest Portable 1 View Result Date: 05/19/2023 CLINICAL DATA:  Hypoxia with syncopal episode. History of metastatic prostate cancer. EXAM: PORTABLE CHEST 1 VIEW COMPARISON:  Radiographs 11/20/2022 and 04/25/2013. Chest CT 09/16/2021. FINDINGS: 0954 hours. The patient's mandible overlies the thoracic inlet. The heart size and mediastinal contours are stable with aortic atherosclerosis. There are low lung volumes without evidence of confluent airspace disease, pleural effusion or pneumothorax. Widespread  sclerotic osseous metastatic disease is again noted without evidence of pathologic fracture. Telemetry leads overlie the chest. IMPRESSION: 1. Low lung volumes without evidence of acute cardiopulmonary process. 2. Widespread sclerotic osseous metastatic disease. Electronically Signed   By: Carey Bullocks M.D.   On: 05/19/2023 10:44    Michaelyn Barter, MD   05/22/2023 12:09 PM

## 2023-05-22 NOTE — Progress Notes (Signed)
OT Cancellation Note  Patient Details Name: Justin Ali MRN: [id number] DOB: 01-Dec-1940   Cancelled Treatment:    Reason Eval/Treat Not Completed: Other (comment). MD advised therapy to hold today. Will re-attempt at later date/time as appropriate.   Arman Filter., MPH, MS, OTR/L ascom [phone] 05/22/23, 12:44 PM

## 2023-05-22 NOTE — Progress Notes (Addendum)
[facility]  ROUNDING NOTE   Subjective:   Patient seen laying in bed  Wife at bedside Appetite remains poor Denies shortness of breath Weakness and fatigue persist  Calcium 12.8 Potassium 2.9  Objective:  Vital signs in last 24 hours:  Temp:  [98.6 F (37 C)-99.7 F (37.6 C)] 98.8 F (37.1 C) (01/24 1101) Pulse Rate:  [75-124] 124 (01/24 1101) Resp:  [16-20] 20 (01/24 1101) BP: (154-176)/(91-114) 173/105 (01/24 1101) SpO2:  [91 %-97 %] 91 % (01/24 1101) Weight:  [71.8 kg] 71.8 kg (01/24 0500)  Weight change:  Filed Weights   05/19/23 0911 05/22/23 0500  Weight: 74 kg 71.8 kg    Intake/Output: I/O last 3 completed shifts: In: 159.4 [I.V.:59.4; IV Piggyback:100] Out: 1600 [Urine:1600]   Intake/Output this shift:  No intake/output data recorded.  Physical Exam: General: NAD  Head: Normocephalic, atraumatic. Moist oral mucosal membranes  Eyes: Anicteric  Lungs:  Clear to auscultation, normal effort  Heart: Regular rate and rhythm  Abdomen:  Soft, nontender  Extremities:  trace peripheral edema.  Neurologic: Alert, moving all four extremities  Skin: No lesions       Basic Metabolic Panel: Recent Labs  Lab 05/19/23 0923 05/20/23 0309 05/21/23 0823 05/22/23 0741  NA 144 147* 147* 151*  K 2.9* 3.0* 2.7* 2.9*  CL 102 110 108 112*  CO2 31 29 28 28   GLUCOSE 127* 95 90 123*  BUN 25* 23 22 25*  CREATININE 2.65* 2.42* 2.36* 2.44*  CALCIUM 12.6* 12.2* 12.9* 12.8*    Liver Function Tests: Recent Labs  Lab 05/20/23 0309 05/21/23 0823 05/22/23 0741  AST 15 17 17   ALT 7 8 9   ALKPHOS 62 61 65  BILITOT 1.0 1.2 1.0  PROT 5.6* 6.1* 6.1*  ALBUMIN 3.3* 3.4* 3.4*   No results for input(s): "LIPASE", "AMYLASE" in the last 168 hours. No results for input(s): "AMMONIA" in the last 168 hours.  CBC: Recent Labs  Lab 05/19/23 0923 05/20/23 0309 05/21/23 0823 05/22/23 0741  WBC 5.2 4.8 5.4 5.6  NEUTROABS 3.5  --  3.6 4.5  HGB 11.3* 10.5*  11.3* 11.0*  HCT 33.7* 30.2* 31.6* 32.2*  MCV 91.8 90.1 86.6 89.7  PLT 149* 144* 151 152    Cardiac Enzymes: No results for input(s): "CKTOTAL", "CKMB", "CKMBINDEX", "TROPONINI" in the last 168 hours.  BNP: Invalid input(s): "POCBNP"  CBG: Recent Labs  Lab 05/20/23 0528 05/22/23 0541 05/22/23 0739  GLUCAP 86 108* 115*    Microbiology: Results for orders placed or performed during the hospital encounter of 05/19/23  Resp panel by RT-PCR (RSV, Flu A&B, Covid) Anterior Nasal Swab     Status: None   Collection Time: 05/19/23 10:00 AM   Specimen: Anterior Nasal Swab  Result Value Ref Range Status   SARS Coronavirus 2 by RT PCR NEGATIVE NEGATIVE Final    Comment: (NOTE) SARS-CoV-2 target nucleic acids are NOT DETECTED.  The SARS-CoV-2 RNA is generally detectable in upper respiratory specimens during the acute phase of infection. The lowest concentration of SARS-CoV-2 viral copies this assay can detect is 138 copies/mL. A negative result does not preclude SARS-Cov-2 infection and should not be used as the sole basis for treatment or other patient management decisions. A negative result may occur with  improper specimen collection/handling, submission of specimen other than nasopharyngeal swab, presence of viral mutation(s) within the areas targeted by this assay, and inadequate number of viral copies(<138 copies/mL). A negative result must be combined with clinical observations, patient  history, and epidemiological information. The expected result is Negative.  Fact Sheet for Patients:  BloggerCourse.com  Fact Sheet for Healthcare Providers:  SeriousBroker.it  This test is no t yet approved or cleared by the [place] FDA and  has been authorized for detection and/or diagnosis of SARS-CoV-2 by FDA under an Emergency Use Authorization (EUA). This EUA will remain  in effect (meaning this test can be used) for the  duration of the COVID-19 declaration under Section 564(b)(1) of the Act, 21 U.S.C.section 360bbb-3(b)(1), unless the authorization is terminated  or revoked sooner.       Influenza A by PCR NEGATIVE NEGATIVE Final   Influenza B by PCR NEGATIVE NEGATIVE Final    Comment: (NOTE) The Xpert Xpress SARS-CoV-2/FLU/RSV plus assay is intended as an aid in the diagnosis of influenza from Nasopharyngeal swab specimens and should not be used as a sole basis for treatment. Nasal washings and aspirates are unacceptable for Xpert Xpress SARS-CoV-2/FLU/RSV testing.  Fact Sheet for Patients: BloggerCourse.com  Fact Sheet for Healthcare Providers: SeriousBroker.it  This test is not yet approved or cleared by the [place] FDA and has been authorized for detection and/or diagnosis of SARS-CoV-2 by FDA under an Emergency Use Authorization (EUA). This EUA will remain in effect (meaning this test can be used) for the duration of the COVID-19 declaration under Section 564(b)(1) of the Act, 21 U.S.C. section 360bbb-3(b)(1), unless the authorization is terminated or revoked.     Resp Syncytial Virus by PCR NEGATIVE NEGATIVE Final    Comment: (NOTE) Fact Sheet for Patients: BloggerCourse.com  Fact Sheet for Healthcare Providers: SeriousBroker.it  This test is not yet approved or cleared by the [place] FDA and has been authorized for detection and/or diagnosis of SARS-CoV-2 by FDA under an Emergency Use Authorization (EUA). This EUA will remain in effect (meaning this test can be used) for the duration of the COVID-19 declaration under Section 564(b)(1) of the Act, 21 U.S.C. section 360bbb-3(b)(1), unless the authorization is terminated or revoked.  Performed at [facility], [address] [place], [place] [address]     Coagulation Studies: No results for input(s):  "LABPROT", "INR" in the last 72 hours.  Urinalysis: No results for input(s): "COLORURINE", "LABSPEC", "PHURINE", "GLUCOSEU", "HGBUR", "BILIRUBINUR", "KETONESUR", "PROTEINUR", "UROBILINOGEN", "NITRITE", "LEUKOCYTESUR" in the last 72 hours.  Invalid input(s): "APPERANCEUR"    Imaging: No results found.   Medications:    dextrose     potassium chloride      abiraterone acetate  1,000 mg Oral QODAY   enoxaparin (LOVENOX) injection  30 mg Subcutaneous Q24H   QUEtiapine  25 mg Oral QHS   sodium chloride flush  3 mL Intravenous Q12H   hydrALAZINE, ondansetron **OR** ondansetron (ZOFRAN) IV, polyethylene glycol powder  Assessment/ Plan:  Mr. Justin Ali is a 83 y.o.  male with a PMHx of metastatic prostate cancer, hypertension, sinus bradycardia, end-stage dementia, syncope, nonalcoholic fatty liver disease, obstructive sleep apnea, peripheral neuropathy, history of colonic adenomas, restless leg syndrome, history of gastric ulcer, coronary artery disease.    Hypercalcemia likely secondary to metastatic prostate cancer.  Acute kidney injury.  Chronic kidney disease stage IIIa baseline creatinine 1.4  EGFR 50   Metastatic prostate cancer.   Hypernatremia serum sodium 151.   Hypokalemia serum potassium 2.9.  Lab Results  Component Value Date   CREATININE 2.44 (H) 05/22/2023   CREATININE 2.36 (H) 05/21/2023   CREATININE 2.42 (H) 05/20/2023    Intake/Output Summary (Last 24 hours) at 05/22/2023  1102 Last data filed at 05/22/2023 1038 Gross per 24 hour  Intake 159.4 ml  Output 1600 ml  Net -1440.6 ml   Plan: Patient presents with significant hypercalcemia with a calcium of 12.9.  He has been provided with normal saline at 100 cc/h.  We do not have recent imaging here to determine the extent of his underlying prostate cancer.    Zoledronic acid 4 mg IV given on 05/21/23. May take 1-2 days before results are seen.  Calcium 12.8 today. Recommend obtaining input from  hematology/oncology as well. Continue to supplement electrolytes as needed.  Awaiting multiple myeloma panel.      LOS: 3 Shantelle Breeze 1/24/202511:02 AM  I saw and evaluated the patient with Wendee Beavers, NP.  I personally formulated the plan of care.  I agree with the findings and plan as documented in the note except as noted below.  Ca still high at 12.8, patient given Zometa yesterday.  Hopefully calcium will start coming down within the next day or 2.  Continue to replete serum potassium as well.  Brayln Duque Cherylann Ratel , MD [facility] 1/24/20256:44 PM

## 2023-05-22 NOTE — Progress Notes (Signed)
       CROSS COVER NOTE  NAME: Justin Ali MRN: [id number] DOB : 12-01-1940 ATTENDING PHYSICIAN: Justin Crape, DO    Date of Service   05/22/2023   HPI/Events of Note   Nurse reports patient with metastatic prostate cancer to bone has non verbal pain score of 8/10 and son also endorsing paitnt in pain.   Interventions   Assessment/Plan:    05/22/2023    9:18 PM 05/22/2023    6:09 PM 05/22/2023    3:53 PM  Vitals with BMI  Systolic 154 173 762  Diastolic 87 104 96  Pulse 94 89 100   Temp 100 ; sats 93% room air Not on chronic pain meds Acetaminophen for fever Morphine 1 mg IV       Justin Mesa NP [facility] Cross Cover 7pm-7am - check amion for availability Pager [phone]

## 2023-05-22 NOTE — Progress Notes (Signed)
PT Cancellation Note  Patient Details Name: Justin Ali MRN: [id number] DOB: November 12, 1940   Cancelled Treatment:    Reason Eval/Treat Not Completed: Medical issues which prohibited therapy MD advised to hold PT today. Will continue to follow.    Leman Martinek 05/22/2023, 11:57 AM

## 2023-05-22 NOTE — Progress Notes (Signed)
PROGRESS NOTE    Justin Ali  BJY:[id number] DOB: 29-Dec-1940 DOA: 05/19/2023 PCP: Eustaquio Boyden, MD  Chief Complaint  Patient presents with   Loss of Consciousness   Bradycardia    Hospital Course:  Justin Ali is 83 y.o. male with prostate cancer, hypertension, sinus bradycardia, dementia, who presented after syncopal episode in his independent living facility.  Found at the dining table slumped over.  Blood sugar on arrival 156, blood pressure 90/50, satting 80% on room air.  Denied any recent fever or chills.  ER workup reveals WBC 5.2, hemoglobin 9.3, platelets 149, COVID/flu/RSV negative.  Creatinine 2.65.  Chest x-ray stable apart from known metastatic prostate cancer.  Subjective: No acute events overnight.  Patient is slightly more altered than baseline today.  Does not participate in evaluation.   Objective: Vitals:   05/21/23 1801 05/21/23 2018 05/22/23 0400 05/22/23 0500  BP: (!) 176/91 (!) 162/114 (!) 156/91   Pulse: 75 83 95   Resp: 18 16 18    Temp: 98.8 F (37.1 C) 98.7 F (37.1 C) 98.7 F (37.1 C)   TempSrc:      SpO2: 93% 94% 92%   Weight:    71.8 kg  Height:        Intake/Output Summary (Last 24 hours) at 05/22/2023 0738 Last data filed at 05/22/2023 0300 Gross per 24 hour  Intake 159.4 ml  Output 1600 ml  Net -1440.6 ml   Filed Weights   05/19/23 0911 05/22/23 0500  Weight: 74 kg 71.8 kg    Examination: General exam: Appears calm and comfortable, NAD  Respiratory system: No work of breathing, symmetric chest wall expansion Cardiovascular system: S1 & S2 heard, RRR.  Gastrointestinal system: Abdomen is nondistended, soft and nontender.  Neuro: Disoriented, but requires significant prompting, does not consistently follow commands extremities: Symmetric, expected ROM Skin: No rashes, lesions Psychiatry: Calm, otherwise difficult to assess  Assessment & Plan:  Principal Problem:   Syncope Active Problems:   Sinus bradycardia    Encephalopathy   Chronic kidney disease, stage 3a (HCC)   Primary malignant 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 (HCC)    Syncope, multifactorial - Orthostatic vitals positive, likely contributed. BP 154/70, pulse 50 bpm.  Standing 115/75 pulse 97 bpm. - I also suspect patient was dehydrated, presented clinically dry.  Echo reveals EF 55 to 60%, no significant diastolic dysfunction, patient is taking Lasix daily at home (for chronic pedal edema).  Have recommended we switch to as needed dosing - Patient also chronically bradycardic which may be playing a role  Bradycardia - Heart rate in the 50s, now much proved and actually tachycardic today. - Appears chronic on review of prior EKGs - Echo mostly unremarkable - Consider cardiology consult if worsening  Hypercalcemia - Calcium 12.8 now - Likely secondary to malignancy as patient has diffuse osteolytic lesions (redemonstrated on CXR) - Alk phos within normal limits -- PTH pending --Has been on normal saline, but given worsening hypernatremia have changed to D5W today. - Nephrology consult - Status post one-time dose zoledronic acid 1/23, will continue to trend calcium.  If not downtrending will plan for subcutaneous calcitonin - Onco consult  Hypokalemia - Replace as needed  Dementia - Son reports at baseline patient has short-term memory deficits but is able to complete his ADLs, lives in an independent living facility - Driving status previously revoked - PT/OT evals pending - Head CT within normal limits - Continue nightly Seroquel  Chronic kidney disease stage IIIa -  Baseline creatinine 1-1.4 with GFR in 40s - Creatinine on arrival 2.65, improving now - Patient was clinically dry on arrival, status post IV hydration - Hold nephrotoxic agents - Renally dose when needed  Metastatic prostate cancer - Metastatic spread to the bones, numerous sclerotic lesions to axial and appendicular skeleton on CT 12/2022. -  Previously on Zytiga and prednisone, appears only on Zytiga at time of admission.  Given worsening electrolytes we will hold Zytiga for now. - Appears patient was previously on hospice with a joint decision of his daughter. His son, [name] has recently revoked hospice and is now requesting full aggressive care - Have engaged oncology during this admission give worsening hypercalcemia  DVT prophylaxis: lovenox   Code Status: Full Code Family Communication: Discussed with caregiver at bedside, as well as son [name] on the phone Disposition:  Status is: Inpatient, TOC consult for SNF    Consultants:    Treatment Team:  Consulting Physician: Mady Haagensen, MD  Procedures:    Antimicrobials:  Anti-infectives (From admission, onward)    None       Data Reviewed: I have personally reviewed following labs and imaging studies CBC: Recent Labs  Lab 05/19/23 0923 05/20/23 0309 05/21/23 0823  WBC 5.2 4.8 5.4  NEUTROABS 3.5  --  3.6  HGB 11.3* 10.5* 11.3*  HCT 33.7* 30.2* 31.6*  MCV 91.8 90.1 86.6  PLT 149* 144* 151   Basic Metabolic Panel: Recent Labs  Lab 05/19/23 0923 05/20/23 0309 05/21/23 0823  NA 144 147* 147*  K 2.9* 3.0* 2.7*  CL 102 110 108  CO2 31 29 28   GLUCOSE 127* 95 90  BUN 25* 23 22  CREATININE 2.65* 2.42* 2.36*  CALCIUM 12.6* 12.2* 12.9*   GFR: Estimated Creatinine Clearance: 22.2 mL/min (A) (by C-G formula based on SCr of 2.36 mg/dL (H)). Liver Function Tests: Recent Labs  Lab 05/20/23 0309 05/21/23 0823  AST 15 17  ALT 7 8  ALKPHOS 62 61  BILITOT 1.0 1.2  PROT 5.6* 6.1*  ALBUMIN 3.3* 3.4*   CBG: Recent Labs  Lab 05/20/23 0528 05/22/23 0541  GLUCAP 86 108*    Recent Results (from the past 240 hours)  Resp panel by RT-PCR (RSV, Flu A&B, Covid) Anterior Nasal Swab     Status: None   Collection Time: 05/19/23 10:00 AM   Specimen: Anterior Nasal Swab  Result Value Ref Range Status   SARS Coronavirus 2 by RT PCR NEGATIVE NEGATIVE Final     Comment: (NOTE) SARS-CoV-2 target nucleic acids are NOT DETECTED.  The SARS-CoV-2 RNA is generally detectable in upper respiratory specimens during the acute phase of infection. The lowest concentration of SARS-CoV-2 viral copies this assay can detect is 138 copies/mL. A negative result does not preclude SARS-Cov-2 infection and should not be used as the sole basis for treatment or other patient management decisions. A negative result may occur with  improper specimen collection/handling, submission of specimen other than nasopharyngeal swab, presence of viral mutation(s) within the areas targeted by this assay, and inadequate number of viral copies(<138 copies/mL). A negative result must be combined with clinical observations, patient history, and epidemiological information. The expected result is Negative.  Fact Sheet for Patients:  BloggerCourse.com  Fact Sheet for Healthcare Providers:  SeriousBroker.it  This test is no t yet approved or cleared by the [place] FDA and  has been authorized for detection and/or diagnosis of SARS-CoV-2 by FDA under an Emergency Use Authorization (EUA). This EUA will remain  in effect (meaning this test can be used) for the duration of the COVID-19 declaration under Section 564(b)(1) of the Act, 21 U.S.C.section 360bbb-3(b)(1), unless the authorization is terminated  or revoked sooner.       Influenza A by PCR NEGATIVE NEGATIVE Final   Influenza B by PCR NEGATIVE NEGATIVE Final    Comment: (NOTE) The Xpert Xpress SARS-CoV-2/FLU/RSV plus assay is intended as an aid in the diagnosis of influenza from Nasopharyngeal swab specimens and should not be used as a sole basis for treatment. Nasal washings and aspirates are unacceptable for Xpert Xpress SARS-CoV-2/FLU/RSV testing.  Fact Sheet for Patients: BloggerCourse.com  Fact Sheet for Healthcare  Providers: SeriousBroker.it  This test is not yet approved or cleared by the [place] FDA and has been authorized for detection and/or diagnosis of SARS-CoV-2 by FDA under an Emergency Use Authorization (EUA). This EUA will remain in effect (meaning this test can be used) for the duration of the COVID-19 declaration under Section 564(b)(1) of the Act, 21 U.S.C. section 360bbb-3(b)(1), unless the authorization is terminated or revoked.     Resp Syncytial Virus by PCR NEGATIVE NEGATIVE Final    Comment: (NOTE) Fact Sheet for Patients: BloggerCourse.com  Fact Sheet for Healthcare Providers: SeriousBroker.it  This test is not yet approved or cleared by the [place] FDA and has been authorized for detection and/or diagnosis of SARS-CoV-2 by FDA under an Emergency Use Authorization (EUA). This EUA will remain in effect (meaning this test can be used) for the duration of the COVID-19 declaration under Section 564(b)(1) of the Act, 21 U.S.C. section 360bbb-3(b)(1), unless the authorization is terminated or revoked.  Performed at [facility], [address], [place], [place] [address]      Radiology Studies: ECHOCARDIOGRAM COMPLETE Result Date: 05/20/2023    ECHOCARDIOGRAM REPORT   Patient Name:   Justin Ali Date of Exam: 05/20/2023 Medical Rec #:  [id number]        Height:       66.0 in Accession #:    [id number]       Weight:       163.1 lb Date of Birth:  28-Jun-1940        BSA:          1.834 m Patient Age:    82 years         BP:           140/67 mmHg Patient Gender: M                HR:           60 bpm. Exam Location:  [facility] Procedure: 2D Echo, Cardiac Doppler and Color Doppler Indications:     Syncope  History:         Patient has no prior history of Echocardiogram examinations.                  CAD, Arrythmias:Bradycardia, Signs/Symptoms:Syncope; Risk                   Factors:Hypertension, Dyslipidemia and Sleep Apnea. CKD.  Sonographer:     Mikki Harbor Referring Phys:  [phone] Francoise Schaumann [place] Diagnosing Phys: Julien Nordmann MD  Sonographer Comments: Technically difficult study due to poor echo windows. Image acquisition challenging due to respiratory motion. IMPRESSIONS  1. Left ventricular ejection fraction, by estimation, is 55 to 60%. The left ventricle has normal function. The left ventricle has no regional wall motion abnormalities. Left ventricular diastolic parameters  are indeterminate.  2. Right ventricular systolic function is normal. The right ventricular size is normal. There is normal pulmonary artery systolic pressure. The estimated right ventricular systolic pressure is 33.5 mmHg.  3. Left atrial size was mildly dilated.  4. The mitral valve is normal in structure. Mild mitral valve regurgitation. No evidence of mitral stenosis.  5. The aortic valve is normal in structure. There is moderate calcification of the aortic valve. Aortic valve regurgitation is not visualized. Aortic valve sclerosis/calcification is present, without any evidence of aortic stenosis. Aortic valve mean gradient measures 7.7 mmHg.  6. The inferior vena cava is normal in size with greater than 50% respiratory variability, suggesting right atrial pressure of 3 mmHg. FINDINGS  Left Ventricle: Left ventricular ejection fraction, by estimation, is 55 to 60%. The left ventricle has normal function. The left ventricle has no regional wall motion abnormalities. The left ventricular internal cavity size was normal in size. There is  no left ventricular hypertrophy. Left ventricular diastolic parameters are indeterminate. Right Ventricle: The right ventricular size is normal. No increase in right ventricular wall thickness. Right ventricular systolic function is normal. There is normal pulmonary artery systolic pressure. The tricuspid regurgitant velocity is 2.76 m/s, and  with an assumed right  atrial pressure of 3 mmHg, the estimated right ventricular systolic pressure is 33.5 mmHg. Left Atrium: Left atrial size was mildly dilated. Right Atrium: Right atrial size was normal in size. Pericardium: There is no evidence of pericardial effusion. Mitral Valve: The mitral valve is normal in structure. Mild mitral valve regurgitation. No evidence of mitral valve stenosis. MV peak gradient, 5.2 mmHg. The mean mitral valve gradient is 2.0 mmHg. Tricuspid Valve: The tricuspid valve is normal in structure. Tricuspid valve regurgitation is mild . No evidence of tricuspid stenosis. Aortic Valve: The aortic valve is normal in structure. There is moderate calcification of the aortic valve. Aortic valve regurgitation is not visualized. Aortic valve sclerosis/calcification is present, without any evidence of aortic stenosis. Aortic valve mean gradient measures 7.7 mmHg. Aortic valve peak gradient measures 16.2 mmHg. Aortic valve area, by VTI measures 1.96 cm. Pulmonic Valve: The pulmonic valve was normal in structure. Pulmonic valve regurgitation is not visualized. No evidence of pulmonic stenosis. Aorta: The aortic root is normal in size and structure. Venous: The inferior vena cava is normal in size with greater than 50% respiratory variability, suggesting right atrial pressure of 3 mmHg. IAS/Shunts: No atrial level shunt detected by color flow Doppler.  LEFT VENTRICLE PLAX 2D LVIDd:         4.50 cm   Diastology LVIDs:         3.00 cm   LV e' medial:   7.83 cm/s LV PW:         1.20 cm   LV E/e' medial: 12.5 LV IVS:        1.30 cm LVOT diam:     2.10 cm LV SV:         81 LV SV Index:   44 LVOT Area:     3.46 cm  RIGHT VENTRICLE RV Basal diam:  3.45 cm RV Mid diam:    3.20 cm RV S prime:     16.90 cm/s TAPSE (M-mode): 3.0 cm LEFT ATRIUM             Index        RIGHT ATRIUM           Index LA diam:  3.40 cm 1.85 cm/m   RA Area:     18.40 cm LA Vol (A2C):   70.4 ml 38.39 ml/m  RA Volume:   48.50 ml  26.45 ml/m  LA Vol (A4C):   50.5 ml 27.54 ml/m LA Biplane Vol: 64.0 ml 34.90 ml/m  AORTIC VALVE                     PULMONIC VALVE AV Area (Vmax):    1.77 cm      PV Vmax:       0.94 m/s AV Area (Vmean):   1.88 cm      PV Peak grad:  3.5 mmHg AV Area (VTI):     1.96 cm AV Vmax:           201.33 cm/s AV Vmean:          121.667 cm/s AV VTI:            0.415 m AV Peak Grad:      16.2 mmHg AV Mean Grad:      7.7 mmHg LVOT Vmax:         103.00 cm/s LVOT Vmean:        66.100 cm/s LVOT VTI:          0.235 m LVOT/AV VTI ratio: 0.57  AORTA Ao Root diam: 3.50 cm MITRAL VALVE                TRICUSPID VALVE MV Area (PHT): 6.12 cm     TR Peak grad:   30.5 mmHg MV Area VTI:   1.85 cm     TR Vmax:        276.00 cm/s MV Peak grad:  5.2 mmHg MV Mean grad:  2.0 mmHg     SHUNTS MV Vmax:       1.14 m/s     Systemic VTI:  0.24 m MV Vmean:      58.4 cm/s    Systemic Diam: 2.10 cm MV Decel Time: 124 msec MV E velocity: 97.70 cm/s MV A velocity: 106.00 cm/s MV E/A ratio:  0.92 Julien Nordmann MD Electronically signed by Julien Nordmann MD Signature Date/Time: 05/20/2023/10:50:03 AM    Final     Scheduled Meds:  abiraterone acetate  1,000 mg Oral QODAY   enoxaparin (LOVENOX) injection  30 mg Subcutaneous Q24H   QUEtiapine  25 mg Oral QHS   sodium chloride flush  3 mL Intravenous Q12H   Continuous Infusions:  sodium chloride 100 mL/hr at 05/22/23 0257     LOS: 3 days    Time spent:   Debarah Crape, DO [facility]  To contact the attending physician between 7A-7P please use Epic Chat. To contact the covering physician during after hours 7P-7A, please review Amion.   05/22/2023, 7:38 AM   *This document has been created with the assistance of dictation software. Please excuse typographical errors. *

## 2023-05-22 NOTE — Progress Notes (Signed)
   05/22/23 1101  Assess: MEWS Score  Temp 98.8 F (37.1 C)  BP (!) 173/105  MAP (mmHg) 118  Pulse Rate (!) 124  Resp 20  SpO2 91 %  O2 Device Room Air  Assess: MEWS Score  MEWS Temp 0  MEWS Systolic 0  MEWS Pulse 2  MEWS RR 0  MEWS LOC 0  MEWS Score 2  MEWS Score Color Yellow  Assess: if the MEWS score is Yellow or Red  Were vital signs accurate and taken at a resting state? Yes  Does the patient meet 2 or more of the SIRS criteria? No  MEWS guidelines implemented  Yes, yellow  Treat  MEWS Interventions Considered administering scheduled or prn medications/treatments as ordered  Take Vital Signs  Increase Vital Sign Frequency  Yellow: Q2hr x1, continue Q4hrs until patient remains green for 12hrs  Escalate  MEWS: Escalate Yellow: Discuss with charge nurse and consider notifying provider and/or RRT  Notify: Charge Nurse/RN  Name of Charge Nurse/RN Notified Jo, RN  Provider Notification  Provider Name/Title Duard Larsen DO  Date Provider Notified 05/22/23  Time Provider Notified 1100  Method of Notification Face-to-face  Notification Reason Change in status (elevated HR)  Provider response See new orders  Date of Provider Response 05/22/23  Time of Provider Response 1100  Assess: SIRS CRITERIA  SIRS Temperature  0  SIRS Respirations  0  SIRS Pulse 1  SIRS WBC 0  SIRS Score Sum  1

## 2023-05-23 ENCOUNTER — Inpatient Hospital Stay: Payer: No Typology Code available for payment source

## 2023-05-23 DIAGNOSIS — N1831 Chronic kidney disease, stage 3a: Secondary | ICD-10-CM | POA: Diagnosis not present

## 2023-05-23 DIAGNOSIS — R001 Bradycardia, unspecified: Secondary | ICD-10-CM | POA: Diagnosis not present

## 2023-05-23 DIAGNOSIS — E870 Hyperosmolality and hypernatremia: Secondary | ICD-10-CM | POA: Diagnosis not present

## 2023-05-23 LAB — COMPREHENSIVE METABOLIC PANEL
ALT: 9 U/L (ref 0–44)
AST: 18 U/L (ref 15–41)
Albumin: 3.3 g/dL — ABNORMAL LOW (ref 3.5–5.0)
Alkaline Phosphatase: 63 U/L (ref 38–126)
Anion gap: 11 (ref 5–15)
BUN: 26 mg/dL — ABNORMAL HIGH (ref 8–23)
CO2: 27 mmol/L (ref 22–32)
Calcium: 11.4 mg/dL — ABNORMAL HIGH (ref 8.9–10.3)
Chloride: 112 mmol/L — ABNORMAL HIGH (ref 98–111)
Creatinine, Ser: 2.47 mg/dL — ABNORMAL HIGH (ref 0.61–1.24)
GFR, Estimated: 26 mL/min — ABNORMAL LOW (ref >60)
Glucose, Bld: 107 mg/dL — ABNORMAL HIGH (ref 70–99)
Potassium: 3 mmol/L — ABNORMAL LOW (ref 3.5–5.1)
Sodium: 150 mmol/L — ABNORMAL HIGH (ref 135–145)
Total Bilirubin: 1 mg/dL (ref 0.0–1.2)
Total Protein: 6 g/dL — ABNORMAL LOW (ref 6.5–8.1)

## 2023-05-23 LAB — CBC WITH DIFFERENTIAL/PLATELET
Abs Immature Granulocytes: 0.02 10*3/uL (ref 0.00–0.07)
Basophils Absolute: 0 10*3/uL (ref 0.0–0.1)
Basophils Relative: 1 %
Eosinophils Absolute: 0.1 10*3/uL (ref 0.0–0.5)
Eosinophils Relative: 3 %
HCT: 31.8 % — ABNORMAL LOW (ref 39.0–52.0)
Hemoglobin: 10.8 g/dL — ABNORMAL LOW (ref 13.0–17.0)
Immature Granulocytes: 1 %
Lymphocytes Relative: 17 %
Lymphs Abs: 0.7 10*3/uL (ref 0.7–4.0)
MCH: 31.1 pg (ref 26.0–34.0)
MCHC: 34 g/dL (ref 30.0–36.0)
MCV: 91.6 fL (ref 80.0–100.0)
Monocytes Absolute: 0.4 10*3/uL (ref 0.1–1.0)
Monocytes Relative: 9 %
Neutro Abs: 2.8 10*3/uL (ref 1.7–7.7)
Neutrophils Relative %: 69 %
Platelets: 130 10*3/uL — ABNORMAL LOW (ref 150–400)
RBC: 3.47 MIL/uL — ABNORMAL LOW (ref 4.22–5.81)
RDW: 13.2 % (ref 11.5–15.5)
WBC: 4 10*3/uL (ref 4.0–10.5)
nRBC: 0 % (ref 0.0–0.2)

## 2023-05-23 LAB — PHOSPHORUS: Phosphorus: 4.1 mg/dL (ref 2.5–4.6)

## 2023-05-23 LAB — GLUCOSE, CAPILLARY
Glucose-Capillary: 113 mg/dL — ABNORMAL HIGH (ref 70–99)
Glucose-Capillary: 88 mg/dL (ref 70–99)

## 2023-05-23 LAB — PSA: Prostatic Specific Antigen: 1.56 ng/mL (ref 0.00–4.00)

## 2023-05-23 LAB — MAGNESIUM: Magnesium: 1.7 mg/dL (ref 1.7–2.4)

## 2023-05-23 MED ORDER — ENSURE ENLIVE PO LIQD
237.0000 mL | Freq: Two times a day (BID) | ORAL | Status: DC
  Administered 2023-05-24 – 2023-06-02 (×5): 237 mL via ORAL

## 2023-05-23 MED ORDER — POTASSIUM CHLORIDE 10 MEQ/100ML IV SOLN
10.0000 meq | INTRAVENOUS | Status: AC
  Administered 2023-05-23 (×5): 10 meq via INTRAVENOUS
  Filled 2023-05-23: qty 100

## 2023-05-23 MED ORDER — POTASSIUM CHLORIDE 10 MEQ/100ML IV SOLN
10.0000 meq | INTRAVENOUS | Status: DC
  Administered 2023-05-23: 10 meq via INTRAVENOUS
  Filled 2023-05-23 (×3): qty 100

## 2023-05-23 MED ORDER — POTASSIUM CHLORIDE IN NACL 20-0.45 MEQ/L-% IV SOLN
INTRAVENOUS | Status: AC
  Filled 2023-05-23 (×3): qty 1000

## 2023-05-23 NOTE — Progress Notes (Addendum)
[facility]  ROUNDING NOTE   Subjective:   Patient seen resting in bed, son at bedside Alert Appetite remains poor Denies nausea or vomiting Room air  Son brought multiple pill bottles from home, states father was taking a lot of calcium supplements.  Calcium 11.4 Potassium 3.0  Objective:  Vital signs in last 24 hours:  Temp:  [98.9 F (37.2 C)-100.3 F (37.9 C)] 98.9 F (37.2 C) (01/25 0853) Pulse Rate:  [89-109] 102 (01/25 0853) Resp:  [16-18] 16 (01/25 0853) BP: (146-173)/(87-104) 146/95 (01/25 0853) SpO2:  [92 %-94 %] 94 % (01/25 0853) Weight:  [70.9 kg] 70.9 kg (01/25 0500)  Weight change: -0.9 kg Filed Weights   05/19/23 0911 05/22/23 0500 05/23/23 0500  Weight: 74 kg 71.8 kg 70.9 kg    Intake/Output: I/O last 3 completed shifts: In: 498.6 [IV Piggyback:498.6] Out: 2700 [Urine:2700]   Intake/Output this shift:  No intake/output data recorded.  Physical Exam: General: NAD  Head: Normocephalic, atraumatic. Moist oral mucosal membranes  Eyes: Anicteric  Lungs:  Clear to auscultation, normal effort  Heart: Regular rate and rhythm  Abdomen:  Soft, nontender  Extremities:  trace peripheral edema.  Neurologic: Alert, moving all four extremities  Skin: No lesions       Basic Metabolic Panel: Recent Labs  Lab 05/19/23 0923 05/20/23 0309 05/21/23 0823 05/21/23 1243 05/22/23 0741 05/23/23 0458  NA 144 147* 147*  --  151* 150*  K 2.9* 3.0* 2.7*  --  2.9* 3.0*  CL 102 110 108  --  112* 112*  CO2 31 29 28   --  28 27  GLUCOSE 127* 95 90  --  123* 107*  BUN 25* 23 22  --  25* 26*  CREATININE 2.65* 2.42* 2.36*  --  2.44* 2.47*  CALCIUM 12.6* 12.2* 12.9* 13.5* 12.8* 11.4*  MG  --   --   --   --   --  1.7  PHOS  --   --   --   --   --  4.1    Liver Function Tests: Recent Labs  Lab 05/20/23 0309 05/21/23 0823 05/22/23 0741 05/23/23 0458  AST 15 17 17 18   ALT 7 8 9 9   ALKPHOS 62 61 65 63  BILITOT 1.0 1.2 1.0 1.0  PROT 5.6* 6.1*  6.1* 6.0*  ALBUMIN 3.3* 3.4* 3.4* 3.3*   No results for input(s): "LIPASE", "AMYLASE" in the last 168 hours. No results for input(s): "AMMONIA" in the last 168 hours.  CBC: Recent Labs  Lab 05/19/23 0923 05/20/23 0309 05/21/23 0823 05/22/23 0741 05/23/23 0458  WBC 5.2 4.8 5.4 5.6 4.0  NEUTROABS 3.5  --  3.6 4.5 2.8  HGB 11.3* 10.5* 11.3* 11.0* 10.8*  HCT 33.7* 30.2* 31.6* 32.2* 31.8*  MCV 91.8 90.1 86.6 89.7 91.6  PLT 149* 144* 151 152 130*    Cardiac Enzymes: No results for input(s): "CKTOTAL", "CKMB", "CKMBINDEX", "TROPONINI" in the last 168 hours.  BNP: Invalid input(s): "POCBNP"  CBG: Recent Labs  Lab 05/20/23 0528 05/22/23 0541 05/22/23 0739 05/23/23 0433  GLUCAP 86 108* 115* 113*    Microbiology: Results for orders placed or performed during the hospital encounter of 05/19/23  Resp panel by RT-PCR (RSV, Flu A&B, Covid) Anterior Nasal Swab     Status: None   Collection Time: 05/19/23 10:00 AM   Specimen: Anterior Nasal Swab  Result Value Ref Range Status   SARS Coronavirus 2 by RT PCR NEGATIVE NEGATIVE Final    Comment: (  NOTE) SARS-CoV-2 target nucleic acids are NOT DETECTED.  The SARS-CoV-2 RNA is generally detectable in upper respiratory specimens during the acute phase of infection. The lowest concentration of SARS-CoV-2 viral copies this assay can detect is 138 copies/mL. A negative result does not preclude SARS-Cov-2 infection and should not be used as the sole basis for treatment or other patient management decisions. A negative result may occur with  improper specimen collection/handling, submission of specimen other than nasopharyngeal swab, presence of viral mutation(s) within the areas targeted by this assay, and inadequate number of viral copies(<138 copies/mL). A negative result must be combined with clinical observations, patient history, and epidemiological information. The expected result is Negative.  Fact Sheet for Patients:   BloggerCourse.com  Fact Sheet for Healthcare Providers:  SeriousBroker.it  This test is no t yet approved or cleared by the [place] FDA and  has been authorized for detection and/or diagnosis of SARS-CoV-2 by FDA under an Emergency Use Authorization (EUA). This EUA will remain  in effect (meaning this test can be used) for the duration of the COVID-19 declaration under Section 564(b)(1) of the Act, 21 U.S.C.section 360bbb-3(b)(1), unless the authorization is terminated  or revoked sooner.       Influenza A by PCR NEGATIVE NEGATIVE Final   Influenza B by PCR NEGATIVE NEGATIVE Final    Comment: (NOTE) The Xpert Xpress SARS-CoV-2/FLU/RSV plus assay is intended as an aid in the diagnosis of influenza from Nasopharyngeal swab specimens and should not be used as a sole basis for treatment. Nasal washings and aspirates are unacceptable for Xpert Xpress SARS-CoV-2/FLU/RSV testing.  Fact Sheet for Patients: BloggerCourse.com  Fact Sheet for Healthcare Providers: SeriousBroker.it  This test is not yet approved or cleared by the [place] FDA and has been authorized for detection and/or diagnosis of SARS-CoV-2 by FDA under an Emergency Use Authorization (EUA). This EUA will remain in effect (meaning this test can be used) for the duration of the COVID-19 declaration under Section 564(b)(1) of the Act, 21 U.S.C. section 360bbb-3(b)(1), unless the authorization is terminated or revoked.     Resp Syncytial Virus by PCR NEGATIVE NEGATIVE Final    Comment: (NOTE) Fact Sheet for Patients: BloggerCourse.com  Fact Sheet for Healthcare Providers: SeriousBroker.it  This test is not yet approved or cleared by the [place] FDA and has been authorized for detection and/or diagnosis of SARS-CoV-2 by FDA under an Emergency Use  Authorization (EUA). This EUA will remain in effect (meaning this test can be used) for the duration of the COVID-19 declaration under Section 564(b)(1) of the Act, 21 U.S.C. section 360bbb-3(b)(1), unless the authorization is terminated or revoked.  Performed at [facility], [address] [place], [place] [address]     Coagulation Studies: No results for input(s): "LABPROT", "INR" in the last 72 hours.  Urinalysis: No results for input(s): "COLORURINE", "LABSPEC", "PHURINE", "GLUCOSEU", "HGBUR", "BILIRUBINUR", "KETONESUR", "PROTEINUR", "UROBILINOGEN", "NITRITE", "LEUKOCYTESUR" in the last 72 hours.  Invalid input(s): "APPERANCEUR"    Imaging: No results found.   Medications:    potassium chloride      amLODipine  5 mg Oral Daily   enoxaparin (LOVENOX) injection  30 mg Subcutaneous Q24H   feeding supplement  237 mL Oral BID BM   QUEtiapine  25 mg Oral QHS   sodium chloride flush  3 mL Intravenous Q12H   acetaminophen, hydrALAZINE, labetalol, ondansetron **OR** ondansetron (ZOFRAN) IV, polyethylene glycol powder  Assessment/ Plan:  Justin Ali is a 83 y.o.  male  with a PMHx of metastatic prostate cancer, hypertension, sinus bradycardia, end-stage dementia, syncope, nonalcoholic fatty liver disease, obstructive sleep apnea, peripheral neuropathy, history of colonic adenomas, restless leg syndrome, history of gastric ulcer, coronary artery disease.    Hypercalcemia likely secondary to metastatic prostate cancer.  Acute kidney injury.  Chronic kidney disease stage IIIa baseline creatinine 1.4  EGFR 50   Metastatic prostate cancer.   Hypernatremia    Hypokalemia   Lab Results  Component Value Date   CREATININE 2.47 (H) 05/23/2023   CREATININE 2.44 (H) 05/22/2023   CREATININE 2.36 (H) 05/21/2023    Intake/Output Summary (Last 24 hours) at 05/23/2023 1127 Last data filed at 05/23/2023 1610 Gross per 24 hour  Intake 498.56 ml  Output 1500 ml  Net  -1001.44 ml   Assessment: Patient presents acute kidney injury with significant hypercalcemia with a calcium of 12.9 at admission.  Most likely related to metastatic prostate cancer.  Initially managed with normal saline.  Also given zoledronic acid 4 mg IV on 05/21/2023.  Plan: -Calcium has improved today to 11.9. -Severe electrolyte abnormalities are noted including hyponatremia and hypokalemia. -Start IV half-normal saline with potassium supplementation follow. -noted to have significant electrolyte abnormalities including sodium of 150 with potassium 3.0.  Will order half-normal saline with 20 of potassium at 60 mL.  Awaiting multiple myeloma panel.   -Serum creatinine appears to be stabilizing at 2.4.  No acute indication for dialysis at present.   LOS: 4 Shantelle Breeze 1/25/202511:27 AM     Patient was seen and examined with Wendee Beavers, NP.  Plan of care was formulated for the problems addressed and discussed with NP.  I agree with the note as documented except as noted below.

## 2023-05-23 NOTE — Plan of Care (Signed)
  Problem: Education: Goal: Knowledge of condition and prescribed therapy will improve Outcome: Progressing   Problem: Physical Regulation: Goal: Complications related to the disease process, condition or treatment will be avoided or minimized Outcome: Progressing   Problem: Education: Goal: Knowledge of General Education information will improve Description: Including pain rating scale, medication(s)/side effects and non-pharmacologic comfort measures Outcome: Progressing   Problem: Activity: Goal: Risk for activity intolerance will decrease Outcome: Progressing   Problem: Nutrition: Goal: Adequate nutrition will be maintained Outcome: Progressing   Problem: Coping: Goal: Level of anxiety will decrease Outcome: Progressing   Problem: Elimination: Goal: Will not experience complications related to bowel motility Outcome: Progressing   Problem: Pain Managment: Goal: General experience of comfort will improve and/or be controlled Outcome: Progressing   Problem: Safety: Goal: Ability to remain free from injury will improve Outcome: Progressing   Problem: Skin Integrity: Goal: Risk for impaired skin integrity will decrease Outcome: Progressing

## 2023-05-23 NOTE — Evaluation (Signed)
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   2 mm - non-bleeding 03/18/2013 colonoscopy    CAD (coronary artery disease), native coronary artery 2014   By CT lung (04/2013)    Cataract    Gastric ulcer    HLD (hyperlipidemia)    HTN (hypertension)    Hyperglycemia    NAFLD (nonalcoholic fatty liver disease) 78/2956   by abd [place] with increased LFTs   OSA (obstructive sleep apnea)    did not tolerate CPAP   Other benign neoplasm of connective and other soft tissue of unspecified site    Neurofibroma of lateral periorbital area   Peripheral neuropathy    Personal history of colonic adenomas 08/05/2012   Pneumonia 01/2013   CAP LUL   Restless legs syndrome (RLS)    Sleep apnea    no cpap   Past Surgical History:  Past Surgical History:  Procedure Laterality Date   COLONOSCOPY  07/2012   5 adenomas (tubular, TV, serrated), rec rpt 5 months [name])   COLONOSCOPY  02/2013   residual adenomas, cecal AVM, rec rpt 1 yr [name])   COLONOSCOPY  03/2014   no residual polyp, cecal AVM, rpt 3-4 yrs [name])   ECTROPION REPAIR Bilateral 09/29/2017   Procedure: REPAIR OF ECTROPION, EXTENSIVE UPPER AND LOWER;  Surgeon: Imagene Riches, MD;  Location[facility];  Service: Ophthalmology;  Laterality: Bilateral;   ESOPHAGOGASTRODUODENOSCOPY (EGD) WITH PROPOFOL N/A 01/02/2022   normal esophagus, erosive gastropathy without recent bleed, nonbleeding gastric ulcers and duodenal ulcer with flat pigmented spotWohl, [name], MD)   ORIF ANKLE FRACTURE  11/29/2000   Dr. Katrinka Blazing   PTOSIS REPAIR Bilateral 09/29/2017   Procedure: BLEPHAROPTOSIS REPAIR RESECT EX UPPER;  Surgeon:  Imagene Riches, MD;  Location: [facility];  Service: Ophthalmology;  Laterality: Bilateral;   RECONSTRUCTION OF EYELID Bilateral 05/31/2018   SKIN CANCER EXCISION  1997   left anterior neck   HPI:  Justin Ali is 83 y.o. male with prostate cancer, hypertension, sinus bradycardia, dementia, who presented after syncopal episode in his independent living facility.  Found at the dining table slumped over.  Blood sugar on arrival 156, blood pressure 90/50, satting 80% on room air.  Denied any recent fever or chills.  ER workup reveals WBC 5.2, hemoglobin 9.3, platelets 149, COVID/flu/RSV negative.  Creatinine 2.65.  Chest x-ray stable apart from known metastatic prostate cancer. CT Head 1/25: Stable head CT. No evidence of acute intracranial abnormality. Sclerotic bone metastases. CXR 1/21: Low lung volumes without evidence of acute cardiopulmonary process. Widespread sclerotic osseous metastatic disease. Pt currently on a regular solids and thin liquids diet. BSE ordered secondary to nursing concern for safety with PO intake.    Assessment / Plan / Recommendation  Clinical Impression  Pt seen this date for bedside swallow assessment in the setting of RN/family concern for aspiration. Pt resting comfortably on room air, alert upon therapist arrival. Pt making eye contact and occasionally demonstrating facial expressions, with minimal vocalizations during session. Dry cough noted through out session not directly related to PO intake. Pt seen with limited trials of PO secondary to cognition (pt refusal to pull from straw, open mouth for spoon, self  feed selected trial, etc) despite verbal and tactile cues for facilitation. With max cues, pt tasking 2 cup sips of thin liquids, 1/2 tsp of puree, and 1 bite of regular solids. No overt s/sx of aspiration. Extended time provided for all oral manipulation and clearance of solids, with pt demonstrating oral holding/pocketing for regular solids. Pt's oral  phase significantly impacted by cognition (baseline dementia exacerbated in unfamiliar environment/hospital setting), also increasing risk for aspiration. Extensive education provided to son and daughter in law regarding connection of cognition and oral intake with specific focus on endurance for meals, attention for material in oral cavity, assessing for oral holding, and aspiration risk. Family endorsed understanding. All questions answered regarding aspiration risk and precautions.          Based on age, cognition, and deconditioning, pt is at risk for aspiration. Recommend aspiration precautions (slow rate, small bites, elevated HOB, and alert for PO intake). Reduce distractions for PO intake. Assist pt with holding cup to facilitate cup sips of thin liquids (pt is not accustomed to use of straw). Recommend continued regular solids (cut meats) with thin liquids. Pt likely to benefit from nutrition consult based on low appetite in the hospital. MD and RN made aware. No further SLP services indicated. Please re-consult in the setting of acute changes. SLP Visit Diagnosis: Dysphagia, unspecified (R13.10)    Aspiration Risk  Mild aspiration risk    Diet Recommendation   Thin;Age appropriate regular (cut meats, extra sauces)  Medication Administration: Whole meds with liquid (vs crushed as pt will accept medication)    Other  Recommendations Recommended Consults:  (nutrition) Oral Care Recommendations: Oral care BID;Oral care before and after PO    Recommendations for follow up therapy are one component of a multi-disciplinary discharge planning process, led by the attending physician.  Recommendations may be updated based on patient status, additional functional criteria and insurance authorization.  Follow up Recommendations No SLP follow up         Functional Status Assessment Patient has not had a recent decline in their functional status (suspect swallow function is intact)    Swallow  Study   General Date of Onset: 05/23/23 HPI: Justin Ali is 83 y.o. male with prostate cancer, hypertension, sinus bradycardia, dementia, who presented after syncopal episode in his independent living facility.  Found at the dining table slumped over.  Blood sugar on arrival 156, blood pressure 90/50, satting 80% on room air.  Denied any recent fever or chills.  ER workup reveals WBC 5.2, hemoglobin 9.3, platelets 149, COVID/flu/RSV negative.  Creatinine 2.65.  Chest x-ray stable apart from known metastatic prostate cancer. CT Head 1/25: Stable head CT. No evidence of acute intracranial abnormality. Sclerotic bone metastases. CXR 1/21: Low lung volumes without evidence of acute cardiopulmonary process. Widespread sclerotic osseous metastatic disease. Pt currently on a regular solids and thin liquids diet. BSE ordered secondary to nursing concern for safety with PO intake. Type of Study: Bedside Swallow Evaluation Previous Swallow Assessment: none in chart Diet Prior to this Study: Regular;Thin liquids (Level 0) Temperature Spikes Noted: Yes (100.3; WBC 4) Respiratory Status: Room air History of Recent Intubation: No Behavior/Cognition: Confused;Requires cueing Oral Cavity Assessment: Within Functional Limits Oral Care Completed by SLP: Yes Oral Cavity - Dentition: Adequate natural dentition Vision: Functional for self-feeding Self-Feeding Abilities: Needs assist Patient Positioning: Upright in bed Baseline Vocal Quality:  (not witnessed) Volitional Cough: Cognitively unable to elicit Volitional Swallow: Unable to elicit    Oral/Motor/Sensory Function Overall Oral  Motor/Sensory Function: Within functional limits   Ice Chips Ice chips: Not tested   Thin Liquid Thin Liquid: Impaired Presentation: Cup;Straw Oral Phase Impairments: Poor awareness of bolus (reduced awarness for pull on straw) Oral Phase Functional Implications:  (none) Pharyngeal  Phase Impairments:  (none)    Nectar Thick  Nectar Thick Liquid: Not tested   Honey Thick Honey Thick Liquid: Not tested   Puree Puree: Impaired Presentation: Spoon Oral Phase Impairments: Poor awareness of bolus Oral Phase Functional Implications: Prolonged oral transit;Oral holding Pharyngeal Phase Impairments:  (none)   Solid     Solid: Impaired Presentation:  (placed on lips for eventual oral acceptance) Oral Phase Impairments: Poor awareness of bolus;Impaired mastication Oral Phase Functional Implications: Impaired mastication;Oral holding;Prolonged oral transit Pharyngeal Phase Impairments:  (none)     [place] Kasia Trego Clapp  MS [facility] SLP   [place] J Clapp 05/23/2023,4:49 PM

## 2023-05-23 NOTE — Plan of Care (Signed)
  Problem: Education: Goal: Knowledge of condition and prescribed therapy will improve Outcome: Progressing   Problem: Cardiac: Goal: Will achieve and/or maintain adequate cardiac output Outcome: Progressing   Problem: Activity: Goal: Risk for activity intolerance will decrease Outcome: Progressing   Problem: Nutrition: Goal: Adequate nutrition will be maintained Outcome: Progressing   Problem: Coping: Goal: Level of anxiety will decrease Outcome: Progressing   Problem: Elimination: Goal: Will not experience complications related to bowel motility Outcome: Progressing   Problem: Pain Managment: Goal: General experience of comfort will improve and/or be controlled Outcome: Progressing   Problem: Safety: Goal: Ability to remain free from injury will improve Outcome: Progressing   Problem: Skin Integrity: Goal: Risk for impaired skin integrity will decrease Outcome: Progressing

## 2023-05-23 NOTE — Progress Notes (Signed)
PROGRESS NOTE    Justin Ali  ZOX:[id number] DOB: 1941-02-03 DOA: 05/19/2023 PCP: Eustaquio Boyden, MD  Chief Complaint  Patient presents with   Loss of Consciousness   Bradycardia    Hospital Course:  Justin Ali is 83 y.o. male with prostate cancer, hypertension, sinus bradycardia, dementia, who presented after syncopal episode in his independent living facility.  Found at the dining table slumped over.  Blood sugar on arrival 156, blood pressure 90/50, satting 80% on room air.  Denied any recent fever or chills.  ER workup reveals WBC 5.2, hemoglobin 9.3, platelets 149, COVID/flu/RSV negative.  Creatinine 2.65.  Chest x-ray stable apart from known metastatic prostate cancer.  Subjective: Persistent tachycardia and pain overnight. On evaluation this morning patient is alert but is unable to respond to questions, he does not his head yes or no but does not verbalize. He appears more responsive with his daughter in law at bedside Patient's son is also at bedside.  We discussed his father's overall prognosis.  The patient's son again has asked about ivermectin or fenbendazole for treatment of the patient's metastatic prostate cancer.  We discussed that at this time these medications have not proven to be efficacious for anything outside of antiparasitic therapies.  We discussed there is not FDA approval for these medications in malignancy treatment.  I also recommended that we continue with his current treatment and medications as to not further confuse the picture given these medications may cause abnormal LFT elevation.  Objective: Vitals:   05/22/23 2118 05/23/23 0414 05/23/23 0500 05/23/23 0853  BP: (!) 154/87 (!) 160/89  (!) 146/95  Pulse: 94   (!) 102  Resp: 16 16  16   Temp: 100 F (37.8 C) 100.3 F (37.9 C)  98.9 F (37.2 C)  TempSrc:      SpO2: 93% 92%  94%  Weight:   70.9 kg   Height:        Intake/Output Summary (Last 24 hours) at 05/23/2023 0950 Last data  filed at 05/23/2023 8119 Gross per 24 hour  Intake 498.56 ml  Output 1500 ml  Net -1001.44 ml   Filed Weights   05/19/23 0911 05/22/23 0500 05/23/23 0500  Weight: 74 kg 71.8 kg 70.9 kg    Examination: General exam: Appears calm and comfortable, NAD  Respiratory system: No work of breathing, symmetric chest wall expansion Cardiovascular system: S1 & S2 heard, RRR.  Gastrointestinal system: Abdomen is nondistended, soft and nontender.  Neuro: Awake, alert, follows some commands, does not respond or participate in evaluation extremities: Symmetric, expected ROM Skin: No rashes, lesions Psychiatry: Calm, otherwise difficult to assess  Assessment & Plan:  Principal Problem:   Syncope Active Problems:   Sinus bradycardia   Encephalopathy   Chronic kidney disease, stage 3a (HCC)   Prostate cancer metastatic to bone [facility])   Primary malignant 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 (HCC)   Hypercalcemia of malignancy   Hypokalemia   Hypernatremia   AKI (acute kidney injury) (HCC)    Syncope, multifactorial - Orthostatic vitals positive, likely contributed. BP 154/70, pulse 50 bpm.  Standing 115/75 pulse 97 bpm. - I also suspect patient was dehydrated, presented clinically dry.  Echo reveals EF 55 to 60%, no significant diastolic dysfunction, patient is taking Lasix daily at home (for chronic pedal edema).  Have recommended we switch to as needed dosing - Patient also chronically bradycardic which may be playing a role  Altered mental status Tachycardia - Workup ongoing.  Likely multifactorial,  electrolyte disturbance + history of dementia+ several delirium - Head CT 1/25 negative for acute intracranial abnormalities - Patient does appear more alert today though still appears to have some expressive aphasia.  Will obtain brain MRI if not resolving as electrolytes resolved to normal - Infectious workup has thus far been negative - Dopplers negative for DVT  Bradycardia -  Resolved now - Appears chronic on review of prior EKGs - Echo mostly unremarkable  Hypercalcemia - Likely secondary to malignancy as patient has diffuse osteolytic lesions (redemonstrated on CXR and brain CT) - Patient's son also reports he has been taking calcium supplements outpatient - Alk phos within normal limits -- PTH and MM work up pending --Has been on normal saline, but given worsening hypernatremia have changed to D5W which was then stopped 1/24 due to htn - Nephrology consult - Status post one-time dose zoledronic acid 1/23, will continue to trend calcium.  If not downtrending will plan for subcutaneous calcitonin - Onco consult  Hypokalemia - Replace as needed  Dementia - Son reports at baseline patient has short-term memory deficits but is able to complete his ADLs, lives in an independent living facility - Driving status previously revoked - PT/OT evals pending - Head CT within normal limits on arrival, repeat 1/25 WNL - Continue nightly Seroquel  Chronic kidney disease stage IIIa - Baseline creatinine 1-1.4 with GFR in 40s - Creatinine on arrival 2.65, improving now - Patient was clinically dry on arrival, status post IV hydration - Hold nephrotoxic agents - Renally dose when needed  Metastatic prostate cancer - Metastatic 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  Given worsening electrolytes we will hold Zytiga for now. - Appears patient was previously on hospice with a joint decision of his daughter. His son, [name] has recently revoked hospice and is now requesting full aggressive care - Have engaged oncology during this admission give worsening hypercalcemia -- PSA 1.56 -Have discussed benefits of palliative care  +/- hospice again with the patient's family, they are aware and considering - Have also discussed recommendations  to proceed with the FDA approved medications only and to avoid additional supplements/meds including ivermectin and fenbendazole to avoid complicating his current clinical picture  DVT prophylaxis: lovenox   Code Status: Full Code Family Communication: Discussed with son [name] and daughter in law at bedside Disposition:  Status is: Inpatient, [facility] consult for SNF    Consultants:    Treatment Team:  Consulting Physician: Mady Haagensen, MD  Procedures:    Antimicrobials:  Anti-infectives (From admission, onward)    None       Data Reviewed: I have personally reviewed following labs and imaging studies CBC: Recent Labs  Lab 05/19/23 0923 05/20/23 0309 05/21/23 0823 05/22/23 0741 05/23/23 0458  WBC 5.2 4.8 5.4 5.6 4.0  NEUTROABS 3.5  --  3.6 4.5 2.8  HGB 11.3* 10.5* 11.3* 11.0* 10.8*  HCT 33.7* 30.2* 31.6* 32.2* 31.8*  MCV 91.8 90.1 86.6 89.7 91.6  PLT 149* 144* 151 152 130*   Basic Metabolic Panel: Recent Labs  Lab 05/19/23 0923 05/20/23 0309 05/21/23 0823 05/21/23 1243 05/22/23 0741 05/23/23 0458  NA 144 147* 147*  --  151* 150*  K 2.9* 3.0* 2.7*  --  2.9* 3.0*  CL 102 110 108  --  112* 112*  CO2 31 29 28   --  28 27  GLUCOSE 127*  95 90  --  123* 107*  BUN 25* 23 22  --  25* 26*  CREATININE 2.65* 2.42* 2.36*  --  2.44* 2.47*  CALCIUM 12.6* 12.2* 12.9* 13.5* 12.8* 11.4*  MG  --   --   --   --   --  1.7  PHOS  --   --   --   --   --  4.1   GFR: Estimated Creatinine Clearance: 21.2 mL/min (A) (by C-G formula based on SCr of 2.47 mg/dL (H)). Liver Function Tests: Recent Labs  Lab 05/20/23 0309 05/21/23 0823 05/22/23 0741 05/23/23 0458  AST 15 17 17 18   ALT 7 8 9 9   ALKPHOS 62 61 65 63  BILITOT 1.0 1.2 1.0 1.0  PROT 5.6* 6.1* 6.1* 6.0*  ALBUMIN 3.3* 3.4* 3.4* 3.3*   CBG: Recent Labs  Lab 05/20/23 0528 05/22/23 0541 05/22/23 0739 05/23/23 0433  GLUCAP 86 108* 115* 113*    Recent Results (from the past 240 hours)  Resp panel by RT-PCR (RSV,  Flu A&B, Covid) Anterior Nasal Swab     Status: None   Collection Time: 05/19/23 10:00 AM   Specimen: Anterior Nasal Swab  Result Value Ref Range Status   SARS Coronavirus 2 by RT PCR NEGATIVE NEGATIVE Final    Comment: (NOTE) SARS-CoV-2 target nucleic acids are NOT DETECTED.  The SARS-CoV-2 RNA is generally detectable in upper respiratory specimens during the acute phase of infection. The lowest concentration of SARS-CoV-2 viral copies this assay can detect is 138 copies/mL. A negative result does not preclude SARS-Cov-2 infection and should not be used as the sole basis for treatment or other patient management decisions. A negative result may occur with  improper specimen collection/handling, submission of specimen other than nasopharyngeal swab, presence of viral mutation(s) within the areas targeted by this assay, and inadequate number of viral copies(<138 copies/mL). A negative result must be combined with clinical observations, patient history, and epidemiological information. The expected result is Negative.  Fact Sheet for Patients:  BloggerCourse.com  Fact Sheet for Healthcare Providers:  SeriousBroker.it  This test is no t yet approved or cleared by the [place] FDA and  has been authorized for detection and/or diagnosis of SARS-CoV-2 by FDA under an Emergency Use Authorization (EUA). This EUA will remain  in effect (meaning this test can be used) for the duration of the COVID-19 declaration under Section 564(b)(1) of the Act, 21 U.S.C.section 360bbb-3(b)(1), unless the authorization is terminated  or revoked sooner.       Influenza A by PCR NEGATIVE NEGATIVE Final   Influenza B by PCR NEGATIVE NEGATIVE Final    Comment: (NOTE) The Xpert Xpress SARS-CoV-2/FLU/RSV plus assay is intended as an aid in the diagnosis of influenza from Nasopharyngeal swab specimens and should not be used as a sole basis for  treatment. Nasal washings and aspirates are unacceptable for Xpert Xpress SARS-CoV-2/FLU/RSV testing.  Fact Sheet for Patients: BloggerCourse.com  Fact Sheet for Healthcare Providers: SeriousBroker.it  This test is not yet approved or cleared by the [place] FDA and has been authorized for detection and/or diagnosis of SARS-CoV-2 by FDA under an Emergency Use Authorization (EUA). This EUA will remain in effect (meaning this test can be used) for the duration of the COVID-19 declaration under Section 564(b)(1) of the Act, 21 U.S.C. section 360bbb-3(b)(1), unless the authorization is terminated or revoked.     Resp Syncytial Virus by PCR NEGATIVE NEGATIVE Final    Comment: (NOTE) Fact Sheet for  Patients: BloggerCourse.com  Fact Sheet for Healthcare Providers: SeriousBroker.it  This test is not yet approved or cleared by the [place] FDA and has been authorized for detection and/or diagnosis of SARS-CoV-2 by FDA under an Emergency Use Authorization (EUA). This EUA will remain in effect (meaning this test can be used) for the duration of the COVID-19 declaration under Section 564(b)(1) of the Act, 21 U.S.C. section 360bbb-3(b)(1), unless the authorization is terminated or revoked.  Performed at [facility], [address] [place], [place] [address]      Radiology Studies: No results found.   Scheduled Meds:  amLODipine  5 mg Oral Daily   enoxaparin (LOVENOX) injection  30 mg Subcutaneous Q24H   feeding supplement  237 mL Oral BID BM   QUEtiapine  25 mg Oral QHS   sodium chloride flush  3 mL Intravenous Q12H   Continuous Infusions:     LOS: 4 days    Time spent:   Debarah Crape, DO [facility]  To contact the attending physician between 7A-7P please use Epic Chat. To contact the covering physician during after hours 7P-7A,  please review Amion.   05/23/2023, 9:50 AM   *This document has been created with the assistance of dictation software. Please excuse typographical errors. *

## 2023-05-24 DIAGNOSIS — E870 Hyperosmolality and hypernatremia: Secondary | ICD-10-CM | POA: Diagnosis not present

## 2023-05-24 DIAGNOSIS — N179 Acute kidney failure, unspecified: Secondary | ICD-10-CM | POA: Diagnosis not present

## 2023-05-24 DIAGNOSIS — E876 Hypokalemia: Secondary | ICD-10-CM

## 2023-05-24 DIAGNOSIS — R001 Bradycardia, unspecified: Secondary | ICD-10-CM | POA: Diagnosis not present

## 2023-05-24 LAB — CBC WITH DIFFERENTIAL/PLATELET
Abs Immature Granulocytes: 0.02 10*3/uL (ref 0.00–0.07)
Basophils Absolute: 0 10*3/uL (ref 0.0–0.1)
Basophils Relative: 0 %
Eosinophils Absolute: 0.3 10*3/uL (ref 0.0–0.5)
Eosinophils Relative: 6 %
HCT: 30.5 % — ABNORMAL LOW (ref 39.0–52.0)
Hemoglobin: 10.3 g/dL — ABNORMAL LOW (ref 13.0–17.0)
Immature Granulocytes: 0 %
Lymphocytes Relative: 30 %
Lymphs Abs: 1.4 10*3/uL (ref 0.7–4.0)
MCH: 30.7 pg (ref 26.0–34.0)
MCHC: 33.8 g/dL (ref 30.0–36.0)
MCV: 91 fL (ref 80.0–100.0)
Monocytes Absolute: 0.5 10*3/uL (ref 0.1–1.0)
Monocytes Relative: 10 %
Neutro Abs: 2.5 10*3/uL (ref 1.7–7.7)
Neutrophils Relative %: 54 %
Platelets: 146 10*3/uL — ABNORMAL LOW (ref 150–400)
RBC: 3.35 MIL/uL — ABNORMAL LOW (ref 4.22–5.81)
RDW: 13.2 % (ref 11.5–15.5)
WBC: 4.6 10*3/uL (ref 4.0–10.5)
nRBC: 0 % (ref 0.0–0.2)

## 2023-05-24 LAB — COMPREHENSIVE METABOLIC PANEL
ALT: 10 U/L (ref 0–44)
AST: 19 U/L (ref 15–41)
Albumin: 3.1 g/dL — ABNORMAL LOW (ref 3.5–5.0)
Alkaline Phosphatase: 59 U/L (ref 38–126)
Anion gap: 9 (ref 5–15)
BUN: 31 mg/dL — ABNORMAL HIGH (ref 8–23)
CO2: 24 mmol/L (ref 22–32)
Calcium: 10.2 mg/dL (ref 8.9–10.3)
Chloride: 116 mmol/L — ABNORMAL HIGH (ref 98–111)
Creatinine, Ser: 2.38 mg/dL — ABNORMAL HIGH (ref 0.61–1.24)
GFR, Estimated: 27 mL/min — ABNORMAL LOW (ref >60)
Glucose, Bld: 83 mg/dL (ref 70–99)
Potassium: 3.1 mmol/L — ABNORMAL LOW (ref 3.5–5.1)
Sodium: 149 mmol/L — ABNORMAL HIGH (ref 135–145)
Total Bilirubin: 1.1 mg/dL (ref 0.0–1.2)
Total Protein: 5.7 g/dL — ABNORMAL LOW (ref 6.5–8.1)

## 2023-05-24 LAB — GLUCOSE, CAPILLARY
Glucose-Capillary: 88 mg/dL (ref 70–99)
Glucose-Capillary: 85 mg/dL (ref 70–99)
Glucose-Capillary: 90 mg/dL (ref 70–99)
Glucose-Capillary: 88 mg/dL (ref 70–99)

## 2023-05-24 LAB — MAGNESIUM: Magnesium: 1.8 mg/dL (ref 1.7–2.4)

## 2023-05-24 LAB — PHOSPHORUS: Phosphorus: 2.9 mg/dL (ref 2.5–4.6)

## 2023-05-24 NOTE — Progress Notes (Signed)
[facility]  ROUNDING NOTE   Subjective:   Patient sitting up in bed No family present Breakfast at bedside table, appears untouched Alert, delayed response  Calcium 10.2 Potassium 3.1  Objective:  Vital signs in last 24 hours:  Temp:  [97.4 F (36.3 C)-98.6 F (37 C)] 98.6 F (37 C) (01/26 0803) Pulse Rate:  [83-101] 99 (01/26 0803) Resp:  [16-18] 17 (01/26 0803) BP: (131-159)/(89-108) 159/93 (01/26 0803) SpO2:  [94 %-98 %] 94 % (01/26 0803) Weight:  [71.5 kg] 71.5 kg (01/26 0500)  Weight change: 0.6 kg Filed Weights   05/22/23 0500 05/23/23 0500 05/24/23 0500  Weight: 71.8 kg 70.9 kg 71.5 kg    Intake/Output: I/O last 3 completed shifts: In: 100 [IV Piggyback:100] Out: 1150 [Urine:1150]   Intake/Output this shift:  No intake/output data recorded.  Physical Exam: General: NAD  Head: Normocephalic, atraumatic. Moist oral mucosal membranes  Eyes: Anicteric  Lungs:  Clear to auscultation, normal effort  Heart: Regular rate and rhythm  Abdomen:  Soft, nontender  Extremities:  no peripheral edema.  Neurologic: Alert, moving all four extremities  Skin: No lesions       Basic Metabolic Panel: Recent Labs  Lab 05/20/23 0309 05/21/23 0823 05/21/23 1243 05/22/23 0741 05/23/23 0458 05/24/23 0448  NA 147* 147*  --  151* 150* 149*  K 3.0* 2.7*  --  2.9* 3.0* 3.1*  CL 110 108  --  112* 112* 116*  CO2 29 28  --  28 27 24   GLUCOSE 95 90  --  123* 107* 83  BUN 23 22  --  25* 26* 31*  CREATININE 2.42* 2.36*  --  2.44* 2.47* 2.38*  CALCIUM 12.2* 12.9*   < > 12.8* 11.4* 10.2  MG  --   --   --   --  1.7 1.8  PHOS  --   --   --   --  4.1 2.9   < > = values in this interval not displayed.    Liver Function Tests: Recent Labs  Lab 05/20/23 0309 05/21/23 0823 05/22/23 0741 05/23/23 0458 05/24/23 0448  AST 15 17 17 18 19   ALT 7 8 9 9 10   ALKPHOS 62 61 65 63 59  BILITOT 1.0 1.2 1.0 1.0 1.1  PROT 5.6* 6.1* 6.1* 6.0* 5.7*  ALBUMIN 3.3* 3.4*  3.4* 3.3* 3.1*   No results for input(s): "LIPASE", "AMYLASE" in the last 168 hours. No results for input(s): "AMMONIA" in the last 168 hours.  CBC: Recent Labs  Lab 05/19/23 0923 05/20/23 0309 05/21/23 0823 05/22/23 0741 05/23/23 0458 05/24/23 0448  WBC 5.2 4.8 5.4 5.6 4.0 4.6  NEUTROABS 3.5  --  3.6 4.5 2.8 2.5  HGB 11.3* 10.5* 11.3* 11.0* 10.8* 10.3*  HCT 33.7* 30.2* 31.6* 32.2* 31.8* 30.5*  MCV 91.8 90.1 86.6 89.7 91.6 91.0  PLT 149* 144* 151 152 130* 146*    Cardiac Enzymes: No results for input(s): "CKTOTAL", "CKMB", "CKMBINDEX", "TROPONINI" in the last 168 hours.  BNP: Invalid input(s): "POCBNP"  CBG: Recent Labs  Lab 05/22/23 0739 05/23/23 0433 05/23/23 2052 05/24/23 0527 05/24/23 0807  GLUCAP 115* 113* 88 88 85    Microbiology: Results for orders placed or performed during the hospital encounter of 05/19/23  Resp panel by RT-PCR (RSV, Flu A&B, Covid) Anterior Nasal Swab     Status: None   Collection Time: 05/19/23 10:00 AM   Specimen: Anterior Nasal Swab  Result Value Ref Range Status   SARS Coronavirus  2 by RT PCR NEGATIVE NEGATIVE Final    Comment: (NOTE) SARS-CoV-2 target nucleic acids are NOT DETECTED.  The SARS-CoV-2 RNA is generally detectable in upper respiratory specimens during the acute phase of infection. The lowest concentration of SARS-CoV-2 viral copies this assay can detect is 138 copies/mL. A negative result does not preclude SARS-Cov-2 infection and should not be used as the sole basis for treatment or other patient management decisions. A negative result may occur with  improper specimen collection/handling, submission of specimen other than nasopharyngeal swab, presence of viral mutation(s) within the areas targeted by this assay, and inadequate number of viral copies(<138 copies/mL). A negative result must be combined with clinical observations, patient history, and epidemiological information. The expected result is  Negative.  Fact Sheet for Patients:  BloggerCourse.com  Fact Sheet for Healthcare Providers:  SeriousBroker.it  This test is no t yet approved or cleared by the [place] FDA and  has been authorized for detection and/or diagnosis of SARS-CoV-2 by FDA under an Emergency Use Authorization (EUA). This EUA will remain  in effect (meaning this test can be used) for the duration of the COVID-19 declaration under Section 564(b)(1) of the Act, 21 U.S.C.section 360bbb-3(b)(1), unless the authorization is terminated  or revoked sooner.       Influenza A by PCR NEGATIVE NEGATIVE Final   Influenza B by PCR NEGATIVE NEGATIVE Final    Comment: (NOTE) The Xpert Xpress SARS-CoV-2/FLU/RSV plus assay is intended as an aid in the diagnosis of influenza from Nasopharyngeal swab specimens and should not be used as a sole basis for treatment. Nasal washings and aspirates are unacceptable for Xpert Xpress SARS-CoV-2/FLU/RSV testing.  Fact Sheet for Patients: BloggerCourse.com  Fact Sheet for Healthcare Providers: SeriousBroker.it  This test is not yet approved or cleared by the [place] FDA and has been authorized for detection and/or diagnosis of SARS-CoV-2 by FDA under an Emergency Use Authorization (EUA). This EUA will remain in effect (meaning this test can be used) for the duration of the COVID-19 declaration under Section 564(b)(1) of the Act, 21 U.S.C. section 360bbb-3(b)(1), unless the authorization is terminated or revoked.     Resp Syncytial Virus by PCR NEGATIVE NEGATIVE Final    Comment: (NOTE) Fact Sheet for Patients: BloggerCourse.com  Fact Sheet for Healthcare Providers: SeriousBroker.it  This test is not yet approved or cleared by the [place] FDA and has been authorized for detection and/or diagnosis of  SARS-CoV-2 by FDA under an Emergency Use Authorization (EUA). This EUA will remain in effect (meaning this test can be used) for the duration of the COVID-19 declaration under Section 564(b)(1) of the Act, 21 U.S.C. section 360bbb-3(b)(1), unless the authorization is terminated or revoked.  Performed at [facility], [address] [place], [place] [address]     Coagulation Studies: No results for input(s): "LABPROT", "INR" in the last 72 hours.  Urinalysis: No results for input(s): "COLORURINE", "LABSPEC", "PHURINE", "GLUCOSEU", "HGBUR", "BILIRUBINUR", "KETONESUR", "PROTEINUR", "UROBILINOGEN", "NITRITE", "LEUKOCYTESUR" in the last 72 hours.  Invalid input(s): "APPERANCEUR"    Imaging: CT HEAD WO CONTRAST ( ) Result Date: 05/23/2023 CLINICAL DATA:  Mental status change, unknown cause EXAM: CT HEAD WITHOUT CONTRAST TECHNIQUE: Contiguous axial images were obtained from the base of the skull through the vertex without intravenous contrast. RADIATION DOSE REDUCTION: This exam was performed according to the departmental dose-optimization program which includes automated exposure control, adjustment of the mA and/or kV according to patient size and/or use of iterative reconstruction technique. COMPARISON:  MRI  head June 17, 2022.  CT head March 02, 2022. FINDINGS: Brain: No evidence of acute infarction, hemorrhage, hydrocephalus, extra-axial collection or mass lesion/mass effect. Similar cerebral atrophy. Patchy white matter hypodensities are nonspecific but compatible with chronic microvascular ischemic disease. Vascular: No hyperdense vessel. Skull: Sclerotic bone metastases. Sinuses/Orbits: Clear sinuses.  No acute orbital findings. IMPRESSION: 1. Stable head CT.  No evidence of acute intracranial abnormality. 2. Sclerotic bone metastases. Electronically Signed   By: Feliberto Harts M.D.   On: 05/23/2023 13:54   US Venous Img Lower Bilateral (DVT) Result Date:  05/23/2023 CLINICAL DATA:  Lower extremity pain. EXAM: BILATERAL LOWER EXTREMITY VENOUS DOPPLER ULTRASOUND TECHNIQUE: Gray-scale sonography with graded compression, as well as color Doppler and duplex ultrasound were performed to evaluate the lower extremity deep venous systems from the level of the common femoral vein and including the common femoral, femoral, profunda femoral, popliteal and calf veins including the posterior tibial, peroneal and gastrocnemius veins when visible. The superficial great saphenous vein was also interrogated. Spectral Doppler was utilized to evaluate flow at rest and with distal augmentation maneuvers in the common femoral, femoral and popliteal veins. COMPARISON:  None Available. FINDINGS: RIGHT LOWER EXTREMITY Common Femoral Vein: No evidence of thrombus. Normal compressibility, respiratory phasicity and response to augmentation. Saphenofemoral Junction: No evidence of thrombus. Normal compressibility and flow on color Doppler imaging. Profunda Femoral Vein: No evidence of thrombus. Normal compressibility and flow on color Doppler imaging. Femoral Vein: No evidence of thrombus. Normal compressibility, respiratory phasicity and response to augmentation. Popliteal Vein: No evidence of thrombus. Normal compressibility, respiratory phasicity and response to augmentation. Calf Veins: No evidence of thrombus. Normal compressibility and flow on color Doppler imaging. Superficial Great Saphenous Vein: No evidence of thrombus. Normal compressibility. Venous Reflux:  None. Other Findings: No evidence of superficial thrombophlebitis or abnormal fluid collection. LEFT LOWER EXTREMITY Common Femoral Vein: No evidence of thrombus. Normal compressibility, respiratory phasicity and response to augmentation. Saphenofemoral Junction: No evidence of thrombus. Normal compressibility and flow on color Doppler imaging. Profunda Femoral Vein: No evidence of thrombus. Normal compressibility and flow on  color Doppler imaging. Femoral Vein: No evidence of thrombus. Normal compressibility, respiratory phasicity and response to augmentation. Popliteal Vein: No evidence of thrombus. Normal compressibility, respiratory phasicity and response to augmentation. Calf Veins: No evidence of thrombus. Normal compressibility and flow on color Doppler imaging. Superficial Great Saphenous Vein: No evidence of thrombus. Normal compressibility. Venous Reflux:  None. Other Findings: No evidence of superficial thrombophlebitis or abnormal fluid collection. IMPRESSION: No evidence of deep venous thrombosis in either lower extremity. Electronically Signed   By: Irish Lack M.D.   On: 05/23/2023 12:33     Medications:    0.45 % NaCl with KCl 20 mEq / L Stopped (05/24/23 1008)    amLODipine  5 mg Oral Daily   enoxaparin (LOVENOX) injection  30 mg Subcutaneous Q24H   feeding supplement  237 mL Oral BID BM   QUEtiapine  25 mg Oral QHS   sodium chloride flush  3 mL Intravenous Q12H   acetaminophen, hydrALAZINE, labetalol, ondansetron **OR** ondansetron (ZOFRAN) IV, polyethylene glycol powder  Assessment/ Plan:  Mr. Justin Ali is a 83 y.o.  male with a PMHx of metastatic prostate cancer, hypertension, sinus bradycardia, end-stage dementia, syncope, nonalcoholic fatty liver disease, obstructive sleep apnea, peripheral neuropathy, history of colonic adenomas, restless leg syndrome, history of gastric ulcer, coronary artery disease.    Hypercalcemia likely secondary to metastatic prostate cancer.  Acute kidney injury.  Chronic kidney disease stage IIIa baseline creatinine 1.4  EGFR 50   Metastatic prostate cancer.   Hypernatremia    Hypokalemia   Lab Results  Component Value Date   CREATININE 2.38 (H) 05/24/2023   CREATININE 2.47 (H) 05/23/2023   CREATININE 2.44 (H) 05/22/2023    Intake/Output Summary (Last 24 hours) at 05/24/2023 1116 Last data filed at 05/23/2023 1423 Gross per 24 hour  Intake 100  ml  Output 500 ml  Net -400 ml   Assessment: Patient presents acute kidney injury with significant hypercalcemia with a calcium of 12.9 at admission.  Most likely related to metastatic prostate cancer.  Initially managed with normal saline.  Also given zoledronic acid 4 mg IV on 05/21/2023.  Plan: -Calcium continues to improve, 10.2 -Electrolyte imbalances slowly correcting. -Continue IV half-normal saline with potassium supplementation. - Awaiting multiple myeloma panel.   -Serum creatinine slowly correcting, 2.38.  No acute indication for dialysis at present.   LOS: 5 Khyson Sebesta 1/26/202511:16 AM

## 2023-05-24 NOTE — Progress Notes (Signed)
Per MD okay to discontinue telemetry

## 2023-05-24 NOTE — Progress Notes (Signed)
PROGRESS NOTE    CYPHER PAULE  ZOX:[id number] DOB: [birth year] DOA: 05/19/2023 PCP: Eustaquio Boyden, MD  Chief Complaint  Patient presents with   Loss of Consciousness   Bradycardia    Hospital Course:  Justin Ali is 83 y.o. male with prostate cancer, hypertension, sinus bradycardia, dementia, who presented after syncopal episode in his independent living facility.  Found at the dining table slumped over.  Blood sugar on arrival 156, blood pressure 90/50, satting 80% on room air.  Denied any recent fever or chills.  ER workup reveals WBC 5.2, hemoglobin 9.3, platelets 149, COVID/flu/RSV negative.  Creatinine 2.65.  Chest x-ray stable apart from known metastatic prostate cancer.  Subjective: Justin Ali is more alert today.  He is attempting to eat breakfast on arrival.  He does answer some questions though he requires prompting.  Appears improved from prior exams though still not back to baseline.  Objective: Vitals:   05/24/23 0037 05/24/23 0333 05/24/23 0500 05/24/23 0803  BP: (!) 136/92 (!) 131/98  (!) 159/93  Pulse: (!) 101 100  99  Resp: 16 16  17   Temp: 98 F (36.7 C) 98.1 F (36.7 C)  98.6 F (37 C)  TempSrc: Oral Oral    SpO2: 97% 95%  94%  Weight:   71.5 kg   Height:        Intake/Output Summary (Last 24 hours) at 05/24/2023 0857 Last data filed at 05/23/2023 1423 Gross per 24 hour  Intake 100 ml  Output 500 ml  Net -400 ml   Filed Weights   05/22/23 0500 05/23/23 0500 05/24/23 0500  Weight: 71.8 kg 70.9 kg 71.5 kg    Examination: General exam: Appears calm and comfortable, NAD  Respiratory system: No work of breathing, symmetric chest wall expansion Cardiovascular system: S1 & S2 heard, RRR.  Gastrointestinal system: Abdomen is nondistended, soft and nontender.  Neuro: Awake, alert, follows some commands, responds yes and no verbally today extremities: Symmetric, expected ROM Skin: No rashes, lesions Psychiatry: Calm, otherwise difficult to  assess  Assessment & Plan:  Principal Problem:   Syncope Active Problems:   Sinus bradycardia   Encephalopathy   Chronic kidney disease, stage 3a (HCC)   Prostate cancer metastatic to bone (HCC)   Primary malignant 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 (HCC)   Hypercalcemia of malignancy   Hypokalemia   Hypernatremia   AKI (acute kidney injury) (HCC)     Altered mental status Tachycardia - Workup ongoing.  Likely multifactorial, electrolyte disturbance + history of dementia+ hospital delirium - Head CT 1/25 negative for acute intracranial abnormalities - Patient does appear more alert today though still appears to have some expressive aphasia.  Will obtain brain MRI if not resolving as electrolytes resolve to normal - Infectious workup has thus far been negative - Dopplers negative for DVT  Syncope, multifactorial - Orthostatic Hypotension: BP 154/70, pulse 50 bpm.  Standing 115/75 pulse 97 bpm. - Dehydrated on arrival.  Echo reveals EF 55 to 60%, no significant diastolic dysfunction, patient is taking Lasix daily at home (for chronic pedal edema).  Have recommended we switch to PRN dosing - Patient also chronically bradycardic which may be playing a role  Bradycardia - Resolved now - Appears chronic on review of prior EKGs - Echo mostly unremarkable  Hypercalcemia, resolved now --    Resolved status post zoledronic acid, IV fluids - Likely secondary to malignancy as patient has diffuse osteolytic lesions (redemonstrated on CXR and brain CT) - Appears he  has been taking calcium supplements outpatient - Alk phos within normal limits -- PTH low 8 -- MM panel pending - Nephrology consult - Onco consult  Hypokalemia - Replace as needed  Dementia - Son reports at baseline patient has short-term memory deficits but is able to complete his ADLs, lives in an independent living facility - Driving status previously revoked - PT/OT evals pending - Head CT within normal  limits on arrival, repeat 1/25 WNL - Continue nightly Seroquel  Chronic kidney disease stage IIIa - Baseline creatinine 1-1.4 with GFR in 40s - Creatinine on arrival 2.65, improving now - Patient was clinically dry on arrival, status post IV hydration - Hold nephrotoxic agents - Renally dose when needed  Metastatic prostate cancer - Metastatic 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  Given worsening electrolytes we will hold Zytiga for now. - Appears patient was previously on hospice with a joint decision of his daughter. His son, [name] has recently revoked hospice and is now requesting full aggressive care. - Have engaged oncology during this admission give worsening hypercalcemia -- PSA 1.56 -Have discussed benefits of palliative care  +/- hospice again with the patient's family, they are aware and considering - Have also discussed recommendations to proceed with the FDA approved medications only and to avoid additional supplements/meds including ivermectin and fenbendazole to avoid complicating his current clinical picture  DVT prophylaxis: lovenox CODE STATUS: Discussed extensively with the patient's son today.  He endorses that his father is a DNR/DNI and has been for a long period of time.  He would like to remain DNR/DNI in the hospital as well.  He reports he has the documentation at home to support it if needed.  The son is the patient's POA. His daughter, Cordelia Pen, also confirms the patient's wishes to be DNR/DNI. These changes have been made.  Family Communication: Discussed with son [name] and daughter Cordelia Pen Disposition:  Status is: Inpatient, TOC consult for SNF    Consultants:    Treatment Team:  Consulting Physician: Mady Haagensen, MD  Procedures:    Antimicrobials:  Anti-infectives (From admission, onward)    None       Data  Reviewed: I have personally reviewed following labs and imaging studies CBC: Recent Labs  Lab 05/19/23 0923 05/20/23 0309 05/21/23 0823 05/22/23 0741 05/23/23 0458 05/24/23 0448  WBC 5.2 4.8 5.4 5.6 4.0 4.6  NEUTROABS 3.5  --  3.6 4.5 2.8 2.5  HGB 11.3* 10.5* 11.3* 11.0* 10.8* 10.3*  HCT 33.7* 30.2* 31.6* 32.2* 31.8* 30.5*  MCV 91.8 90.1 86.6 89.7 91.6 91.0  PLT 149* 144* 151 152 130* 146*   Basic Metabolic Panel: Recent Labs  Lab 05/20/23 0309 05/21/23 0823 05/21/23 1243 05/22/23 0741 05/23/23 0458 05/24/23 0448  NA 147* 147*  --  151* 150* 149*  K 3.0* 2.7*  --  2.9* 3.0* 3.1*  CL 110 108  --  112* 112* 116*  CO2 29 28  --  28 27 24   GLUCOSE 95 90  --  123* 107* 83  BUN 23 22  --  25* 26* 31*  CREATININE 2.42* 2.36*  --  2.44* 2.47* 2.38*  CALCIUM 12.2* 12.9* 13.5* 12.8* 11.4* 10.2  MG  --   --   --   --  1.7 1.8  PHOS  --   --   --   --  4.1  2.9   GFR: Estimated Creatinine Clearance: 22 mL/min (A) (by C-G formula based on SCr of 2.38 mg/dL (H)). Liver Function Tests: Recent Labs  Lab 05/20/23 0309 05/21/23 0823 05/22/23 0741 05/23/23 0458 05/24/23 0448  AST 15 17 17 18 19   ALT 7 8 9 9 10   ALKPHOS 62 61 65 63 59  BILITOT 1.0 1.2 1.0 1.0 1.1  PROT 5.6* 6.1* 6.1* 6.0* 5.7*  ALBUMIN 3.3* 3.4* 3.4* 3.3* 3.1*   CBG: Recent Labs  Lab 05/22/23 0739 05/23/23 0433 05/23/23 2052 05/24/23 0527 05/24/23 0807  GLUCAP 115* 113* 88 88 85    Recent Results (from the past 240 hours)  Resp panel by RT-PCR (RSV, Flu A&B, Covid) Anterior Nasal Swab     Status: None   Collection Time: 05/19/23 10:00 AM   Specimen: Anterior Nasal Swab  Result Value Ref Range Status   SARS Coronavirus 2 by RT PCR NEGATIVE NEGATIVE Final    Comment: (NOTE) SARS-CoV-2 target nucleic acids are NOT DETECTED.  The SARS-CoV-2 RNA is generally detectable in upper respiratory specimens during the acute phase of infection. The lowest concentration of SARS-CoV-2 viral copies this assay  can detect is 138 copies/mL. A negative result does not preclude SARS-Cov-2 infection and should not be used as the sole basis for treatment or other patient management decisions. A negative result may occur with  improper specimen collection/handling, submission of specimen other than nasopharyngeal swab, presence of viral mutation(s) within the areas targeted by this assay, and inadequate number of viral copies(<138 copies/mL). A negative result must be combined with clinical observations, patient history, and epidemiological information. The expected result is Negative.  Fact Sheet for Patients:  BloggerCourse.com  Fact Sheet for Healthcare Providers:  SeriousBroker.it  This test is no t yet approved or cleared by the [place] FDA and  has been authorized for detection and/or diagnosis of SARS-CoV-2 by FDA under an Emergency Use Authorization (EUA). This EUA will remain  in effect (meaning this test can be used) for the duration of the COVID-19 declaration under Section 564(b)(1) of the Act, 21 U.S.C.section 360bbb-3(b)(1), unless the authorization is terminated  or revoked sooner.       Influenza A by PCR NEGATIVE NEGATIVE Final   Influenza B by PCR NEGATIVE NEGATIVE Final    Comment: (NOTE) The Xpert Xpress SARS-CoV-2/FLU/RSV plus assay is intended as an aid in the diagnosis of influenza from Nasopharyngeal swab specimens and should not be used as a sole basis for treatment. Nasal washings and aspirates are unacceptable for Xpert Xpress SARS-CoV-2/FLU/RSV testing.  Fact Sheet for Patients: BloggerCourse.com  Fact Sheet for Healthcare Providers: SeriousBroker.it  This test is not yet approved or cleared by the [place] FDA and has been authorized for detection and/or diagnosis of SARS-CoV-2 by FDA under an Emergency Use Authorization (EUA). This EUA will  remain in effect (meaning this test can be used) for the duration of the COVID-19 declaration under Section 564(b)(1) of the Act, 21 U.S.C. section 360bbb-3(b)(1), unless the authorization is terminated or revoked.     Resp Syncytial Virus by PCR NEGATIVE NEGATIVE Final    Comment: (NOTE) Fact Sheet for Patients: BloggerCourse.com  Fact Sheet for Healthcare Providers: SeriousBroker.it  This test is not yet approved or cleared by the [place] FDA and has been authorized for detection and/or diagnosis of SARS-CoV-2 by FDA under an Emergency Use Authorization (EUA). This EUA will remain in effect (meaning this test can be used) for the duration of the COVID-19  declaration under Section 564(b)(1) of the Act, 21 U.S.C. section 360bbb-3(b)(1), unless the authorization is terminated or revoked.  Performed at [facility], [address], [place], [place] [address]      Radiology Studies: CT HEAD WO CONTRAST ( ) Result Date: 05/23/2023 CLINICAL DATA:  Mental status change, unknown cause EXAM: CT HEAD WITHOUT CONTRAST TECHNIQUE: Contiguous axial images were obtained from the base of the skull through the vertex without intravenous contrast. RADIATION DOSE REDUCTION: This exam was performed according to the departmental dose-optimization program which includes automated exposure control, adjustment of the mA and/or kV according to patient size and/or use of iterative reconstruction technique. COMPARISON:  MRI head June 17, 2022.  CT head March 02, 2022. FINDINGS: Brain: No evidence of acute infarction, hemorrhage, hydrocephalus, extra-axial collection or mass lesion/mass effect. Similar cerebral atrophy. Patchy white matter hypodensities are nonspecific but compatible with chronic microvascular ischemic disease. Vascular: No hyperdense vessel. Skull: Sclerotic bone metastases. Sinuses/Orbits: Clear sinuses.  No acute orbital  findings. IMPRESSION: 1. Stable head CT.  No evidence of acute intracranial abnormality. 2. Sclerotic bone metastases. Electronically Signed   By: Feliberto Harts M.D.   On: 05/23/2023 13:54   US Venous Img Lower Bilateral (DVT) Result Date: 05/23/2023 CLINICAL DATA:  Lower extremity pain. EXAM: BILATERAL LOWER EXTREMITY VENOUS DOPPLER ULTRASOUND TECHNIQUE: Gray-scale sonography with graded compression, as well as color Doppler and duplex ultrasound were performed to evaluate the lower extremity deep venous systems from the level of the common femoral vein and including the common femoral, femoral, profunda femoral, popliteal and calf veins including the posterior tibial, peroneal and gastrocnemius veins when visible. The superficial great saphenous vein was also interrogated. Spectral Doppler was utilized to evaluate flow at rest and with distal augmentation maneuvers in the common femoral, femoral and popliteal veins. COMPARISON:  None Available. FINDINGS: RIGHT LOWER EXTREMITY Common Femoral Vein: No evidence of thrombus. Normal compressibility, respiratory phasicity and response to augmentation. Saphenofemoral Junction: No evidence of thrombus. Normal compressibility and flow on color Doppler imaging. Profunda Femoral Vein: No evidence of thrombus. Normal compressibility and flow on color Doppler imaging. Femoral Vein: No evidence of thrombus. Normal compressibility, respiratory phasicity and response to augmentation. Popliteal Vein: No evidence of thrombus. Normal compressibility, respiratory phasicity and response to augmentation. Calf Veins: No evidence of thrombus. Normal compressibility and flow on color Doppler imaging. Superficial Great Saphenous Vein: No evidence of thrombus. Normal compressibility. Venous Reflux:  None. Other Findings: No evidence of superficial thrombophlebitis or abnormal fluid collection. LEFT LOWER EXTREMITY Common Femoral Vein: No evidence of thrombus. Normal compressibility,  respiratory phasicity and response to augmentation. Saphenofemoral Junction: No evidence of thrombus. Normal compressibility and flow on color Doppler imaging. Profunda Femoral Vein: No evidence of thrombus. Normal compressibility and flow on color Doppler imaging. Femoral Vein: No evidence of thrombus. Normal compressibility, respiratory phasicity and response to augmentation. Popliteal Vein: No evidence of thrombus. Normal compressibility, respiratory phasicity and response to augmentation. Calf Veins: No evidence of thrombus. Normal compressibility and flow on color Doppler imaging. Superficial Great Saphenous Vein: No evidence of thrombus. Normal compressibility. Venous Reflux:  None. Other Findings: No evidence of superficial thrombophlebitis or abnormal fluid collection. IMPRESSION: No evidence of deep venous thrombosis in either lower extremity. Electronically Signed   By: Irish Lack M.D.   On: 05/23/2023 12:33     Scheduled Meds:  amLODipine  5 mg Oral Daily   enoxaparin (LOVENOX) injection  30 mg Subcutaneous Q24H   feeding supplement  237 mL Oral  BID BM   QUEtiapine  25 mg Oral QHS   sodium chloride flush  3 mL Intravenous Q12H   Continuous Infusions:  0.45 % NaCl with KCl 20 mEq / L 60 mL/hr at 05/24/23 0645      LOS: 5 days    Time spent:   Debarah Crape, DO [facility]  To contact the attending physician between 7A-7P please use Epic Chat. To contact the covering physician during after hours 7P-7A, please review Amion.   05/24/2023, 8:57 AM   *This document has been created with the assistance of dictation software. Please excuse typographical errors. *

## 2023-05-25 ENCOUNTER — Inpatient Hospital Stay: Payer: No Typology Code available for payment source

## 2023-05-25 DIAGNOSIS — N179 Acute kidney failure, unspecified: Secondary | ICD-10-CM | POA: Diagnosis not present

## 2023-05-25 DIAGNOSIS — R001 Bradycardia, unspecified: Secondary | ICD-10-CM | POA: Diagnosis not present

## 2023-05-25 DIAGNOSIS — E876 Hypokalemia: Secondary | ICD-10-CM | POA: Diagnosis not present

## 2023-05-25 DIAGNOSIS — E870 Hyperosmolality and hypernatremia: Secondary | ICD-10-CM | POA: Diagnosis not present

## 2023-05-25 LAB — COMPREHENSIVE METABOLIC PANEL
ALT: 11 U/L (ref 0–44)
AST: 20 U/L (ref 15–41)
Albumin: 3.2 g/dL — ABNORMAL LOW (ref 3.5–5.0)
Alkaline Phosphatase: 64 U/L (ref 38–126)
Anion gap: 10 (ref 5–15)
BUN: 38 mg/dL — ABNORMAL HIGH (ref 8–23)
CO2: 23 mmol/L (ref 22–32)
Calcium: 9.6 mg/dL (ref 8.9–10.3)
Chloride: 119 mmol/L — ABNORMAL HIGH (ref 98–111)
Creatinine, Ser: 2.16 mg/dL — ABNORMAL HIGH (ref 0.61–1.24)
GFR, Estimated: 30 mL/min — ABNORMAL LOW (ref >60)
Glucose, Bld: 94 mg/dL (ref 70–99)
Potassium: 3 mmol/L — ABNORMAL LOW (ref 3.5–5.1)
Sodium: 152 mmol/L — ABNORMAL HIGH (ref 135–145)
Total Bilirubin: 1.3 mg/dL — ABNORMAL HIGH (ref 0.0–1.2)
Total Protein: 6.2 g/dL — ABNORMAL LOW (ref 6.5–8.1)

## 2023-05-25 LAB — KAPPA/LAMBDA LIGHT CHAINS
Kappa free light chain: 26.6 mg/L — ABNORMAL HIGH (ref 3.3–19.4)
Kappa, lambda light chain ratio: 1 (ref 0.26–1.65)
Lambda free light chains: 26.6 mg/L — ABNORMAL HIGH (ref 5.7–26.3)

## 2023-05-25 LAB — CBC WITH DIFFERENTIAL/PLATELET
Abs Immature Granulocytes: 0.02 10*3/uL (ref 0.00–0.07)
Basophils Absolute: 0 10*3/uL (ref 0.0–0.1)
Basophils Relative: 0 %
Eosinophils Absolute: 0.3 10*3/uL (ref 0.0–0.5)
Eosinophils Relative: 6 %
HCT: 31.2 % — ABNORMAL LOW (ref 39.0–52.0)
Hemoglobin: 10.8 g/dL — ABNORMAL LOW (ref 13.0–17.0)
Immature Granulocytes: 0 %
Lymphocytes Relative: 26 %
Lymphs Abs: 1.3 10*3/uL (ref 0.7–4.0)
MCH: 31.2 pg (ref 26.0–34.0)
MCHC: 34.6 g/dL (ref 30.0–36.0)
MCV: 90.2 fL (ref 80.0–100.0)
Monocytes Absolute: 0.4 10*3/uL (ref 0.1–1.0)
Monocytes Relative: 9 %
Neutro Abs: 3 10*3/uL (ref 1.7–7.7)
Neutrophils Relative %: 59 %
Platelets: 161 10*3/uL (ref 150–400)
RBC: 3.46 MIL/uL — ABNORMAL LOW (ref 4.22–5.81)
RDW: 12.9 % (ref 11.5–15.5)
WBC: 5.1 10*3/uL (ref 4.0–10.5)
nRBC: 0 % (ref 0.0–0.2)

## 2023-05-25 LAB — PHOSPHORUS: Phosphorus: 3.6 mg/dL (ref 2.5–4.6)

## 2023-05-25 LAB — MAGNESIUM: Magnesium: 1.9 mg/dL (ref 1.7–2.4)

## 2023-05-25 LAB — GLUCOSE, CAPILLARY
Glucose-Capillary: 84 mg/dL (ref 70–99)
Glucose-Capillary: 91 mg/dL (ref 70–99)
Glucose-Capillary: 104 mg/dL — ABNORMAL HIGH (ref 70–99)
Glucose-Capillary: 109 mg/dL — ABNORMAL HIGH (ref 70–99)

## 2023-05-25 MED ORDER — KCL IN DEXTROSE-NACL 20-5-0.45 MEQ/L-%-% IV SOLN
INTRAVENOUS | Status: AC
  Filled 2023-05-25 (×2): qty 1000

## 2023-05-25 NOTE — Progress Notes (Signed)
PROGRESS NOTE    Justin Ali  ZOX:[id number] DOB: [birth year] DOA: 05/19/2023 PCP: Eustaquio Boyden, MD  Chief Complaint  Patient presents with   Loss of Consciousness   Bradycardia    Hospital Course:  Justin Ali is 83 y.o. male with prostate cancer, hypertension, sinus bradycardia, dementia, who presented after syncopal episode in his independent living facility.  Found at the dining table slumped over.  Blood sugar on arrival 156, blood pressure 90/50, satting 80% on room air.  Denied any recent fever or chills.  ER workup reveals WBC 5.2, hemoglobin 9.3, platelets 149, COVID/flu/RSV negative.  Creatinine 2.65.  Chest x-ray stable apart from known metastatic prostate cancer.  Subjective: Alert and more communicative today.  Caregiver at bedside.  She reports he was eating breakfast earlier and talking with her.  Objective: Vitals:   05/24/23 1957 05/25/23 0113 05/25/23 0219 05/25/23 0539  BP: (!) 146/84 (!) 157/95  (!) 153/99  Pulse: 88 93  95  Resp: 16 20  18   Temp: 98.2 F (36.8 C) 98.4 F (36.9 C)  98.5 F (36.9 C)  TempSrc: Oral Oral  Oral  SpO2: 95% 95%  96%  Weight:   70.2 kg   Height:        Intake/Output Summary (Last 24 hours) at 05/25/2023 0741 Last data filed at 05/25/2023 0542 Gross per 24 hour  Intake 295.58 ml  Output 850 ml  Net -554.42 ml   Filed Weights   05/23/23 0500 05/24/23 0500 05/25/23 0219  Weight: 70.9 kg 71.5 kg 70.2 kg    Examination: General exam: Appears calm and comfortable, NAD  Respiratory system: No work of breathing, symmetric chest wall expansion Cardiovascular system: S1 & S2 heard, RRR.  Gastrointestinal system: Abdomen is nondistended, soft and nontender.  Neuro: Awake, alert, follows some commands, reports  "I am where I am" when asked about location Extremities: Symmetric, expected ROM Skin: No rashes, lesions Psychiatry: Calm, otherwise difficult to assess  Assessment & Plan:  Principal Problem:    Syncope Active Problems:   Sinus bradycardia   Encephalopathy   Chronic kidney disease, stage 3a (HCC)   Prostate cancer metastatic to bone [facility])   Primary malignant 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 (HCC)   Hypercalcemia of malignancy   Hypokalemia   Hypernatremia   AKI (acute kidney injury) (HCC)     Altered mental status, resolving Tachycardia, resolved - multifactorial, electrolyte disturbance + history of dementia+ hospital delirium - Head CT 1/25 negative for acute intracranial abnormalities - Continues to improve, not quite at baseline but much better he is speaking, answering some questions - Infectious workup has thus far been negative - Dopplers negative for DVT  Syncope, multifactorial - Orthostatic Hypotension: BP 154/70, pulse 50 bpm.  Standing 115/75 pulse 97 bpm. - Dehydrated on arrival.  Echo reveals EF 55 to 60%, no significant diastolic dysfunction, patient is taking Lasix daily at home (for chronic pedal edema).  Have recommended we switch to PRN dosing - Patient also chronically bradycardic which may be playing a role - Overnight nurse endorses concerns for carotid abnormalities, bilateral Dopplers ordered and pending  Bradycardia - Resolved now - Appears chronic on review of prior EKGs - Echo mostly unremarkable  Hypercalcemia, resolved now --  status post zoledronic acid, IV fluids - Likely secondary to malignancy as patient has diffuse osteolytic lesions (redemonstrated on CXR and brain CT) - Appears he has been taking calcium supplements outpatient - stopped - Alk phos within normal limits --  PTH low 8 -- MM panel pending - Nephrology consult - Onco consult  Hypokalemia - Replace as needed  Dementia - Son reports at baseline patient has short-term memory deficits but is able to complete his ADLs, lives in an independent living facility - Driving status previously revoked - PT/OT evals pending - Head CT within normal limits on arrival,  repeat 1/25 WNL - Continue nightly Seroquel - Continue delirium precautions  AKI superimposed on chronic kidney disease stage IIIa - Baseline creatinine 1-1.4 with GFR in 40s - Creatinine on arrival 2.65, improving now - Patient was clinically dry on arrival, MIVF per nephrology. - Hold nephrotoxic agents - Renally dose when needed  Metastatic prostate cancer - Metastatic 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  Given worsening electrolytes we will hold Zytiga for now. - Appears patient was previously on hospice with a joint decision of his daughter. His son, [name] has recently revoked hospice and is now requesting full aggressive care. - Have engaged oncology during this admission give worsening hypercalcemia -- PSA 1.56 -Have discussed benefits of palliative care  +/- hospice again with the patient's family, they are aware and considering all options - Have also discussed recommendations to proceed with the FDA approved medications only and to avoid additional supplements/meds including ivermectin and fenbendazole to avoid complicating his current clinical picture  DVT prophylaxis: lovenox CODE STATUS: DNR/DNI Family Communication: Discussed with caregiver at bedside  Disposition:  Status is: Inpatient, TOC consult for SNF    Consultants:    Treatment Team:  Consulting Physician: Mady Haagensen, MD  Procedures:    Antimicrobials:  Anti-infectives (From admission, onward)    None       Data Reviewed: I have personally reviewed following labs and imaging studies CBC: Recent Labs  Lab 05/21/23 0823 05/22/23 0741 05/23/23 0458 05/24/23 0448 05/25/23 0608  WBC 5.4 5.6 4.0 4.6 5.1  NEUTROABS 3.6 4.5 2.8 2.5 3.0  HGB 11.3* 11.0* 10.8* 10.3* 10.8*  HCT 31.6* 32.2* 31.8* 30.5* 31.2*  MCV 86.6 89.7 91.6 91.0 90.2  PLT 151 152 130*  146* 161   Basic Metabolic Panel: Recent Labs  Lab 05/20/23 0309 05/21/23 0823 05/21/23 1243 05/22/23 0741 05/23/23 0458 05/24/23 0448  NA 147* 147*  --  151* 150* 149*  K 3.0* 2.7*  --  2.9* 3.0* 3.1*  CL 110 108  --  112* 112* 116*  CO2 29 28  --  28 27 24   GLUCOSE 95 90  --  123* 107* 83  BUN 23 22  --  25* 26* 31*  CREATININE 2.42* 2.36*  --  2.44* 2.47* 2.38*  CALCIUM 12.2* 12.9* 13.5* 12.8* 11.4* 10.2  MG  --   --   --   --  1.7 1.8  PHOS  --   --   --   --  4.1 2.9   GFR: Estimated Creatinine Clearance: 22 mL/min (A) (by C-G formula based on SCr of 2.38 mg/dL (H)). Liver Function Tests: Recent Labs  Lab 05/20/23 0309 05/21/23 0823 05/22/23 0741 05/23/23 0458 05/24/23 0448  AST 15 17 17 18 19   ALT 7 8 9 9 10   ALKPHOS 62 61 65 63 59  BILITOT 1.0 1.2 1.0 1.0 1.1  PROT 5.6* 6.1* 6.1* 6.0* 5.7*  ALBUMIN 3.3* 3.4* 3.4* 3.3* 3.1*   CBG: Recent Labs  Lab 05/24/23 0527 05/24/23 1610  05/24/23 1201 05/24/23 2020 05/25/23 0641  GLUCAP 88 85 90 88 84    Recent Results (from the past 240 hours)  Resp panel by RT-PCR (RSV, Flu A&B, Covid) Anterior Nasal Swab     Status: None   Collection Time: 05/19/23 10:00 AM   Specimen: Anterior Nasal Swab  Result Value Ref Range Status   SARS Coronavirus 2 by RT PCR NEGATIVE NEGATIVE Final    Comment: (NOTE) SARS-CoV-2 target nucleic acids are NOT DETECTED.  The SARS-CoV-2 RNA is generally detectable in upper respiratory specimens during the acute phase of infection. The lowest concentration of SARS-CoV-2 viral copies this assay can detect is 138 copies/mL. A negative result does not preclude SARS-Cov-2 infection and should not be used as the sole basis for treatment or other patient management decisions. A negative result may occur with  improper specimen collection/handling, submission of specimen other than nasopharyngeal swab, presence of viral mutation(s) within the areas targeted by this assay, and inadequate  number of viral copies(<138 copies/mL). A negative result must be combined with clinical observations, patient history, and epidemiological information. The expected result is Negative.  Fact Sheet for Patients:  BloggerCourse.com  Fact Sheet for Healthcare Providers:  SeriousBroker.it  This test is no t yet approved or cleared by the [place] FDA and  has been authorized for detection and/or diagnosis of SARS-CoV-2 by FDA under an Emergency Use Authorization (EUA). This EUA will remain  in effect (meaning this test can be used) for the duration of the COVID-19 declaration under Section 564(b)(1) of the Act, 21 U.S.C.section 360bbb-3(b)(1), unless the authorization is terminated  or revoked sooner.       Influenza A by PCR NEGATIVE NEGATIVE Final   Influenza B by PCR NEGATIVE NEGATIVE Final    Comment: (NOTE) The Xpert Xpress SARS-CoV-2/FLU/RSV plus assay is intended as an aid in the diagnosis of influenza from Nasopharyngeal swab specimens and should not be used as a sole basis for treatment. Nasal washings and aspirates are unacceptable for Xpert Xpress SARS-CoV-2/FLU/RSV testing.  Fact Sheet for Patients: BloggerCourse.com  Fact Sheet for Healthcare Providers: SeriousBroker.it  This test is not yet approved or cleared by the [place] FDA and has been authorized for detection and/or diagnosis of SARS-CoV-2 by FDA under an Emergency Use Authorization (EUA). This EUA will remain in effect (meaning this test can be used) for the duration of the COVID-19 declaration under Section 564(b)(1) of the Act, 21 U.S.C. section 360bbb-3(b)(1), unless the authorization is terminated or revoked.     Resp Syncytial Virus by PCR NEGATIVE NEGATIVE Final    Comment: (NOTE) Fact Sheet for Patients: BloggerCourse.com  Fact Sheet for Healthcare  Providers: SeriousBroker.it  This test is not yet approved or cleared by the [place] FDA and has been authorized for detection and/or diagnosis of SARS-CoV-2 by FDA under an Emergency Use Authorization (EUA). This EUA will remain in effect (meaning this test can be used) for the duration of the COVID-19 declaration under Section 564(b)(1) of the Act, 21 U.S.C. section 360bbb-3(b)(1), unless the authorization is terminated or revoked.  Performed at [facility], [address], [place], [place] [address]      Radiology Studies: CT HEAD WO CONTRAST ( ) Result Date: 05/23/2023 CLINICAL DATA:  Mental status change, unknown cause EXAM: CT HEAD WITHOUT CONTRAST TECHNIQUE: Contiguous axial images were obtained from the base of the skull through the vertex without intravenous contrast. RADIATION DOSE REDUCTION: This exam was performed according to the departmental dose-optimization program  which includes automated exposure control, adjustment of the mA and/or kV according to patient size and/or use of iterative reconstruction technique. COMPARISON:  MRI head June 17, 2022.  CT head March 02, 2022. FINDINGS: Brain: No evidence of acute infarction, hemorrhage, hydrocephalus, extra-axial collection or mass lesion/mass effect. Similar cerebral atrophy. Patchy white matter hypodensities are nonspecific but compatible with chronic microvascular ischemic disease. Vascular: No hyperdense vessel. Skull: Sclerotic bone metastases. Sinuses/Orbits: Clear sinuses.  No acute orbital findings. IMPRESSION: 1. Stable head CT.  No evidence of acute intracranial abnormality. 2. Sclerotic bone metastases. Electronically Signed   By: Feliberto Harts M.D.   On: 05/23/2023 13:54   US Venous Img Lower Bilateral (DVT) Result Date: 05/23/2023 CLINICAL DATA:  Lower extremity pain. EXAM: BILATERAL LOWER EXTREMITY VENOUS DOPPLER ULTRASOUND TECHNIQUE: Gray-scale sonography with  graded compression, as well as color Doppler and duplex ultrasound were performed to evaluate the lower extremity deep venous systems from the level of the common femoral vein and including the common femoral, femoral, profunda femoral, popliteal and calf veins including the posterior tibial, peroneal and gastrocnemius veins when visible. The superficial great saphenous vein was also interrogated. Spectral Doppler was utilized to evaluate flow at rest and with distal augmentation maneuvers in the common femoral, femoral and popliteal veins. COMPARISON:  None Available. FINDINGS: RIGHT LOWER EXTREMITY Common Femoral Vein: No evidence of thrombus. Normal compressibility, respiratory phasicity and response to augmentation. Saphenofemoral Junction: No evidence of thrombus. Normal compressibility and flow on color Doppler imaging. Profunda Femoral Vein: No evidence of thrombus. Normal compressibility and flow on color Doppler imaging. Femoral Vein: No evidence of thrombus. Normal compressibility, respiratory phasicity and response to augmentation. Popliteal Vein: No evidence of thrombus. Normal compressibility, respiratory phasicity and response to augmentation. Calf Veins: No evidence of thrombus. Normal compressibility and flow on color Doppler imaging. Superficial Great Saphenous Vein: No evidence of thrombus. Normal compressibility. Venous Reflux:  None. Other Findings: No evidence of superficial thrombophlebitis or abnormal fluid collection. LEFT LOWER EXTREMITY Common Femoral Vein: No evidence of thrombus. Normal compressibility, respiratory phasicity and response to augmentation. Saphenofemoral Junction: No evidence of thrombus. Normal compressibility and flow on color Doppler imaging. Profunda Femoral Vein: No evidence of thrombus. Normal compressibility and flow on color Doppler imaging. Femoral Vein: No evidence of thrombus. Normal compressibility, respiratory phasicity and response to augmentation. Popliteal  Vein: No evidence of thrombus. Normal compressibility, respiratory phasicity and response to augmentation. Calf Veins: No evidence of thrombus. Normal compressibility and flow on color Doppler imaging. Superficial Great Saphenous Vein: No evidence of thrombus. Normal compressibility. Venous Reflux:  None. Other Findings: No evidence of superficial thrombophlebitis or abnormal fluid collection. IMPRESSION: No evidence of deep venous thrombosis in either lower extremity. Electronically Signed   By: Irish Lack M.D.   On: 05/23/2023 12:33     Scheduled Meds:  amLODipine  5 mg Oral Daily   enoxaparin (LOVENOX) injection  30 mg Subcutaneous Q24H   feeding supplement  237 mL Oral BID BM   QUEtiapine  25 mg Oral QHS   sodium chloride flush  3 mL Intravenous Q12H   Continuous Infusions:  dextrose 5 % and 0.45 % NaCl with KCl 20 mEq/L 75 mL/hr at 05/25/23 0105      LOS: 6 days    Time spent:   Debarah Crape, DO [facility]  To contact the attending physician between 7A-7P please use Epic Chat. To contact the covering physician during after hours 7P-7A, please review Amion.   05/25/2023, 7:41  AM   *This document has been created with the assistance of dictation software. Please excuse typographical errors. *

## 2023-05-25 NOTE — Plan of Care (Signed)

## 2023-05-25 NOTE — Progress Notes (Addendum)
RN can see a vessel in the R side of the patient's neck pulsating. On palpation, it feels hard and almost circular.   RN notified Larkin Ina, NP of the above information.   NP assessed at bedside. NP states it has bruit and he will look into his history to see if it was previously mentioned.

## 2023-05-25 NOTE — Progress Notes (Signed)
OT Cancellation Note  Patient Details Name: NESANEL AGUILA MRN: [id number] DOB: August 13, 1940   Cancelled Treatment:    Reason Eval/Treat Not Completed: Other (comment). Pt wrapping up session with PT. PT recommending OT return at later time to give pt rest after session. Will re-attempt as able.  Arman Filter., MPH, MS, OTR/L ascom [phone] 05/25/23, 1:30 PM

## 2023-05-25 NOTE — Progress Notes (Signed)
[facility]  ROUNDING NOTE   Subjective:   Patient seen laying in bed Alert, non responsive Family at bedside Room air  Calcium 9.6 Potassium 3.0 Sodium 152  Objective:  Vital signs in last 24 hours:  Temp:  [97.5 F (36.4 C)-98.7 F (37.1 C)] 98.7 F (37.1 C) (01/27 0757) Pulse Rate:  [70-108] 85 (01/27 0757) Resp:  [16-20] 17 (01/27 0757) BP: (146-166)/(82-100) 166/87 (01/27 0757) SpO2:  [94 %-97 %] 97 % (01/27 0757) Weight:  [70.2 kg] 70.2 kg (01/27 0219)  Weight change: -1.3 kg Filed Weights   05/23/23 0500 05/24/23 0500 05/25/23 0219  Weight: 70.9 kg 71.5 kg 70.2 kg    Intake/Output: I/O last 3 completed shifts: In: 295.6 [I.V.:295.6] Out: 850 [Urine:850]   Intake/Output this shift:  Total I/O In: 371 [I.V.:371] Out: -   Physical Exam: General: NAD  Head: Normocephalic, atraumatic. Moist oral mucosal membranes  Eyes: Anicteric  Lungs:  Clear to auscultation, normal effort  Heart: Regular rate and rhythm  Abdomen:  Soft, nontender  Extremities:  no peripheral edema.  Neurologic: Alert, moving all four extremities  Skin: No lesions       Basic Metabolic Panel: Recent Labs  Lab 05/21/23 0823 05/21/23 1243 05/22/23 0741 05/23/23 0458 05/24/23 0448 05/25/23 0608  NA 147*  --  151* 150* 149* 152*  K 2.7*  --  2.9* 3.0* 3.1* 3.0*  CL 108  --  112* 112* 116* 119*  CO2 28  --  28 27 24 23   GLUCOSE 90  --  123* 107* 83 94  BUN 22  --  25* 26* 31* 38*  CREATININE 2.36*  --  2.44* 2.47* 2.38* 2.16*  CALCIUM 12.9*   < > 12.8* 11.4* 10.2 9.6  MG  --   --   --  1.7 1.8 1.9  PHOS  --   --   --  4.1 2.9 3.6   < > = values in this interval not displayed.    Liver Function Tests: Recent Labs  Lab 05/21/23 0823 05/22/23 0741 05/23/23 0458 05/24/23 0448 05/25/23 0608  AST 17 17 18 19 20   ALT 8 9 9 10 11   ALKPHOS 61 65 63 59 64  BILITOT 1.2 1.0 1.0 1.1 1.3*  PROT 6.1* 6.1* 6.0* 5.7* 6.2*  ALBUMIN 3.4* 3.4* 3.3* 3.1* 3.2*   No  results for input(s): "LIPASE", "AMYLASE" in the last 168 hours. No results for input(s): "AMMONIA" in the last 168 hours.  CBC: Recent Labs  Lab 05/21/23 0823 05/22/23 0741 05/23/23 0458 05/24/23 0448 05/25/23 0608  WBC 5.4 5.6 4.0 4.6 5.1  NEUTROABS 3.6 4.5 2.8 2.5 3.0  HGB 11.3* 11.0* 10.8* 10.3* 10.8*  HCT 31.6* 32.2* 31.8* 30.5* 31.2*  MCV 86.6 89.7 91.6 91.0 90.2  PLT 151 152 130* 146* 161    Cardiac Enzymes: No results for input(s): "CKTOTAL", "CKMB", "CKMBINDEX", "TROPONINI" in the last 168 hours.  BNP: Invalid input(s): "POCBNP"  CBG: Recent Labs  Lab 05/24/23 0807 05/24/23 1201 05/24/23 2020 05/25/23 0641 05/25/23 0758  GLUCAP 85 90 88 84 91    Microbiology: Results for orders placed or performed during the hospital encounter of 05/19/23  Resp panel by RT-PCR (RSV, Flu A&B, Covid) Anterior Nasal Swab     Status: None   Collection Time: 05/19/23 10:00 AM   Specimen: Anterior Nasal Swab  Result Value Ref Range Status   SARS Coronavirus 2 by RT PCR NEGATIVE NEGATIVE Final    Comment: (NOTE) SARS-CoV-2  target nucleic acids are NOT DETECTED.  The SARS-CoV-2 RNA is generally detectable in upper respiratory specimens during the acute phase of infection. The lowest concentration of SARS-CoV-2 viral copies this assay can detect is 138 copies/mL. A negative result does not preclude SARS-Cov-2 infection and should not be used as the sole basis for treatment or other patient management decisions. A negative result may occur with  improper specimen collection/handling, submission of specimen other than nasopharyngeal swab, presence of viral mutation(s) within the areas targeted by this assay, and inadequate number of viral copies(<138 copies/mL). A negative result must be combined with clinical observations, patient history, and epidemiological information. The expected result is Negative.  Fact Sheet for Patients:   BloggerCourse.com  Fact Sheet for Healthcare Providers:  SeriousBroker.it  This test is no t yet approved or cleared by the [place] FDA and  has been authorized for detection and/or diagnosis of SARS-CoV-2 by FDA under an Emergency Use Authorization (EUA). This EUA will remain  in effect (meaning this test can be used) for the duration of the COVID-19 declaration under Section 564(b)(1) of the Act, 21 U.S.C.section 360bbb-3(b)(1), unless the authorization is terminated  or revoked sooner.       Influenza A by PCR NEGATIVE NEGATIVE Final   Influenza B by PCR NEGATIVE NEGATIVE Final    Comment: (NOTE) The Xpert Xpress SARS-CoV-2/FLU/RSV plus assay is intended as an aid in the diagnosis of influenza from Nasopharyngeal swab specimens and should not be used as a sole basis for treatment. Nasal washings and aspirates are unacceptable for Xpert Xpress SARS-CoV-2/FLU/RSV testing.  Fact Sheet for Patients: BloggerCourse.com  Fact Sheet for Healthcare Providers: SeriousBroker.it  This test is not yet approved or cleared by the [place] FDA and has been authorized for detection and/or diagnosis of SARS-CoV-2 by FDA under an Emergency Use Authorization (EUA). This EUA will remain in effect (meaning this test can be used) for the duration of the COVID-19 declaration under Section 564(b)(1) of the Act, 21 U.S.C. section 360bbb-3(b)(1), unless the authorization is terminated or revoked.     Resp Syncytial Virus by PCR NEGATIVE NEGATIVE Final    Comment: (NOTE) Fact Sheet for Patients: BloggerCourse.com  Fact Sheet for Healthcare Providers: SeriousBroker.it  This test is not yet approved or cleared by the [place] FDA and has been authorized for detection and/or diagnosis of SARS-CoV-2 by FDA under an Emergency Use  Authorization (EUA). This EUA will remain in effect (meaning this test can be used) for the duration of the COVID-19 declaration under Section 564(b)(1) of the Act, 21 U.S.C. section 360bbb-3(b)(1), unless the authorization is terminated or revoked.  Performed at [facility], [address] [place], [place] [address]     Coagulation Studies: No results for input(s): "LABPROT", "INR" in the last 72 hours.  Urinalysis: No results for input(s): "COLORURINE", "LABSPEC", "PHURINE", "GLUCOSEU", "HGBUR", "BILIRUBINUR", "KETONESUR", "PROTEINUR", "UROBILINOGEN", "NITRITE", "LEUKOCYTESUR" in the last 72 hours.  Invalid input(s): "APPERANCEUR"    Imaging: CT HEAD WO CONTRAST ( ) Result Date: 05/23/2023 CLINICAL DATA:  Mental status change, unknown cause EXAM: CT HEAD WITHOUT CONTRAST TECHNIQUE: Contiguous axial images were obtained from the base of the skull through the vertex without intravenous contrast. RADIATION DOSE REDUCTION: This exam was performed according to the departmental dose-optimization program which includes automated exposure control, adjustment of the mA and/or kV according to patient size and/or use of iterative reconstruction technique. COMPARISON:  MRI head June 17, 2022.  CT head March 02, 2022. FINDINGS: Brain: No  evidence of acute infarction, hemorrhage, hydrocephalus, extra-axial collection or mass lesion/mass effect. Similar cerebral atrophy. Patchy white matter hypodensities are nonspecific but compatible with chronic microvascular ischemic disease. Vascular: No hyperdense vessel. Skull: Sclerotic bone metastases. Sinuses/Orbits: Clear sinuses.  No acute orbital findings. IMPRESSION: 1. Stable head CT.  No evidence of acute intracranial abnormality. 2. Sclerotic bone metastases. Electronically Signed   By: Feliberto Harts M.D.   On: 05/23/2023 13:54   US Venous Img Lower Bilateral (DVT) Result Date: 05/23/2023 CLINICAL DATA:  Lower extremity pain. EXAM:  BILATERAL LOWER EXTREMITY VENOUS DOPPLER ULTRASOUND TECHNIQUE: Gray-scale sonography with graded compression, as well as color Doppler and duplex ultrasound were performed to evaluate the lower extremity deep venous systems from the level of the common femoral vein and including the common femoral, femoral, profunda femoral, popliteal and calf veins including the posterior tibial, peroneal and gastrocnemius veins when visible. The superficial great saphenous vein was also interrogated. Spectral Doppler was utilized to evaluate flow at rest and with distal augmentation maneuvers in the common femoral, femoral and popliteal veins. COMPARISON:  None Available. FINDINGS: RIGHT LOWER EXTREMITY Common Femoral Vein: No evidence of thrombus. Normal compressibility, respiratory phasicity and response to augmentation. Saphenofemoral Junction: No evidence of thrombus. Normal compressibility and flow on color Doppler imaging. Profunda Femoral Vein: No evidence of thrombus. Normal compressibility and flow on color Doppler imaging. Femoral Vein: No evidence of thrombus. Normal compressibility, respiratory phasicity and response to augmentation. Popliteal Vein: No evidence of thrombus. Normal compressibility, respiratory phasicity and response to augmentation. Calf Veins: No evidence of thrombus. Normal compressibility and flow on color Doppler imaging. Superficial Great Saphenous Vein: No evidence of thrombus. Normal compressibility. Venous Reflux:  None. Other Findings: No evidence of superficial thrombophlebitis or abnormal fluid collection. LEFT LOWER EXTREMITY Common Femoral Vein: No evidence of thrombus. Normal compressibility, respiratory phasicity and response to augmentation. Saphenofemoral Junction: No evidence of thrombus. Normal compressibility and flow on color Doppler imaging. Profunda Femoral Vein: No evidence of thrombus. Normal compressibility and flow on color Doppler imaging. Femoral Vein: No evidence of  thrombus. Normal compressibility, respiratory phasicity and response to augmentation. Popliteal Vein: No evidence of thrombus. Normal compressibility, respiratory phasicity and response to augmentation. Calf Veins: No evidence of thrombus. Normal compressibility and flow on color Doppler imaging. Superficial Great Saphenous Vein: No evidence of thrombus. Normal compressibility. Venous Reflux:  None. Other Findings: No evidence of superficial thrombophlebitis or abnormal fluid collection. IMPRESSION: No evidence of deep venous thrombosis in either lower extremity. Electronically Signed   By: Irish Lack M.D.   On: 05/23/2023 12:33     Medications:    dextrose 5 % and 0.45 % NaCl with KCl 20 mEq/L 75 mL/hr at 05/25/23 4098    amLODipine  5 mg Oral Daily   enoxaparin (LOVENOX) injection  30 mg Subcutaneous Q24H   feeding supplement  237 mL Oral BID BM   QUEtiapine  25 mg Oral QHS   sodium chloride flush  3 mL Intravenous Q12H   acetaminophen, hydrALAZINE, labetalol, ondansetron **OR** ondansetron (ZOFRAN) IV, polyethylene glycol powder  Assessment/ Plan:  Mr. Justin Ali is a 83 y.o.  male with a PMHx of metastatic prostate cancer, hypertension, sinus bradycardia, end-stage dementia, syncope, nonalcoholic fatty liver disease, obstructive sleep apnea, peripheral neuropathy, history of colonic adenomas, restless leg syndrome, history of gastric ulcer, coronary artery disease.    Hypercalcemia likely secondary to metastatic prostate cancer.  Acute kidney injury.  Chronic kidney disease stage IIIa baseline creatinine 1.4  EGFR 50   Metastatic prostate cancer.   Hypernatremia    Hypokalemia   Lab Results  Component Value Date   CREATININE 2.16 (H) 05/25/2023   CREATININE 2.38 (H) 05/24/2023   CREATININE 2.47 (H) 05/23/2023    Intake/Output Summary (Last 24 hours) at 05/25/2023 1113 Last data filed at 05/25/2023 0958 Gross per 24 hour  Intake 666.57 ml  Output 850 ml  Net -183.43  ml   Assessment: Patient presents acute kidney injury with significant hypercalcemia with a calcium of 12.9 at admission.  Most likely related to metastatic prostate cancer.  Initially managed with normal saline.  Also given zoledronic acid 4 mg IV on 05/21/2023.  Plan: -Calcium 9.6 -Sodium 152, Potassium 3.0 -IVF changed to D5 0.45% NS with potassium  - Awaiting multiple myeloma panel.   -Serum creatinine  2.16.  No acute indication for dialysis at present.   LOS: 6 Justin Ali 1/27/202511:13 AM

## 2023-05-25 NOTE — Progress Notes (Signed)
Physical Therapy Treatment Patient Details Name: Justin Ali MRN: [id number] DOB: 1940-10-03 Today's Date: 05/25/2023   History of Present Illness Pt is an 83 year old male presenting with syncope, bradycardia, encephalopathy    PMH significant for prostate cancer, hypertension, sinus bradycardia, likely end-stage dementia    PT Comments  Patient alert but limited in communication ability. Did grimace and moan with some movements that appeared to be because his back was sore, but pt did not indicate pain. Significant more difficulty with mobility today, unclear due to weakness/pt cognition. maxAx1-2 for bed mobility, sit <> stand with RW and maxAx2 but pt resistant and clear that he did not want  to ("Leave me alone!"). Pt left with needs in reach at end of session. The patient would benefit from further skilled PT intervention to progress towards goals as able pending ability to meaningfully participate.     If plan is discharge home, recommend the following: A lot of help with walking and/or transfers;A lot of help with bathing/dressing/bathroom;Assist for transportation;Assistance with cooking/housework;Supervision due to cognitive status;Help with stairs or ramp for entrance;Direct supervision/assist for medications management   Can travel by private vehicle     No  Equipment Recommendations  Other (comment) (TBD)    Recommendations for Other Services       Precautions / Restrictions Precautions Precautions: Fall Precaution Comments: watch BP and oxygen Restrictions Weight Bearing Restrictions Per Provider Order: No     Mobility  Bed Mobility Overal bed mobility: Needs Assistance Bed Mobility: Supine to Sit, Sit to Supine     Supine to sit: HOB elevated, Max assist Sit to supine: Max assist, +2 for physical assistance        Transfers Overall transfer level: Needs assistance Equipment used: Rolling walker (2 wheels) Transfers: Sit to/from Stand Sit to Stand: Max  assist, +2 physical assistance                Ambulation/Gait               General Gait Details: unable to ambulate   Stairs             Wheelchair Mobility     Tilt Bed    Modified Rankin (Stroke Patients Only)       Balance Overall balance assessment: Needs assistance Sitting-balance support: Feet supported Sitting balance-Leahy Scale: Good     Standing balance support: Bilateral upper extremity supported Standing balance-Leahy Scale: Poor                              Cognition Arousal: Alert Behavior During Therapy: Flat affect Overall Cognitive Status: Impaired/Different from baseline Area of Impairment: Awareness, Memory, Problem solving, Orientation, Safety/judgement, Following commands                 Orientation Level: Disoriented to, Situation   Memory: Decreased short-term memory Following Commands: Follows one step commands with increased time Safety/Judgement: Decreased awareness of deficits, Decreased awareness of safety Awareness: Emergent Problem Solving: Slow processing, Requires verbal cues, Requires tactile cues          Exercises      General Comments        Pertinent Vitals/Pain Pain Assessment Pain Assessment: Faces Faces Pain Scale: Hurts little more Pain Location: pt did not quantify, or indicate where Pain Descriptors / Indicators: Grimacing, Guarding, Moaning Pain Intervention(s): Limited activity within patient's tolerance, Repositioned, Monitored during session    Home Living  Prior Function            PT Goals (current goals can now be found in the care plan section) Progress towards PT goals: Progressing toward goals    Frequency    Min 1X/week      PT Plan      Co-evaluation              AM-PAC PT "6 Clicks" Mobility   Outcome Measure  Help needed turning from your back to your side while in a flat bed without using  bedrails?: A Lot Help needed moving from lying on your back to sitting on the side of a flat bed without using bedrails?: A Lot Help needed moving to and from a bed to a chair (including a wheelchair)?: A Lot Help needed standing up from a chair using your arms (e.g., wheelchair or bedside chair)?: A Lot Help needed to walk in hospital room?: A Lot Help needed climbing 3-5 steps with a railing? : Total 6 Click Score: 11    End of Session   Activity Tolerance: Other (comment) (limited by cognition) Patient left: in bed;with call bell/phone within reach;with bed alarm set;with family/visitor present Nurse Communication: Mobility status PT Visit Diagnosis: Other abnormalities of gait and mobility (R26.89)     Time: 4098-1191 PT Time Calculation (min) (ACUTE ONLY): 18 min  Charges:    $Therapeutic Activity: 8-22 mins PT General Charges $$ ACUTE PT VISIT: 1 Visit                     Olga Coaster PT, DPT 1:25 PM,05/25/23

## 2023-05-25 NOTE — Consult Note (Signed)
[facility] Liaison Note  05/25/2023  [name] 09/03/1940 [id number]  Location: RN Hospital Liaison met patient at bedside at [facility].  Insurance: [employer]   Justin Ali is a 83 y.o. male who is a Primary Care Patient of Eustaquio Boyden, MD [facility]. The patient was screened for readmission hospitalization with noted high risk score for unplanned readmission risk with 1 IP in 6 months.  The patient was assessed for potential Care Management service needs for post hospital transition for care coordination. Review of patient's electronic medical record reveals patient was admitted for Syncope. Liaison visit pt at bedside-pt receiving pt care. Documentation indicates pt from retirement community [place]. No anticipated needs via VBCI at this time.  Plan: [facility] Liaison will continue to follow progress and disposition to asess for post hospital community care coordination/management needs.  Referral request for community care coordination: anticipate Transitions of Care Team follow up.   VBCI Care Management/Population Health does not replace or interfere with any arrangements made by the Inpatient Transition of Care team.   For questions contact:   Justin Cousin, RN, [facility] Liaison [place]   [facility], Population Health Office Hours MTWF  8:00 am-6:00 pm Direct Dial: [phone] mobile [phone] [Office toll free line] Office Hours are M-F 8:30 - 5 pm [email or web address]

## 2023-05-25 NOTE — Progress Notes (Signed)
IV obtained by staff RN.

## 2023-05-26 ENCOUNTER — Inpatient Hospital Stay: Payer: No Typology Code available for payment source

## 2023-05-26 DIAGNOSIS — E870 Hyperosmolality and hypernatremia: Secondary | ICD-10-CM | POA: Diagnosis not present

## 2023-05-26 DIAGNOSIS — N179 Acute kidney failure, unspecified: Secondary | ICD-10-CM | POA: Diagnosis not present

## 2023-05-26 DIAGNOSIS — R001 Bradycardia, unspecified: Secondary | ICD-10-CM | POA: Diagnosis not present

## 2023-05-26 DIAGNOSIS — E876 Hypokalemia: Secondary | ICD-10-CM | POA: Diagnosis not present

## 2023-05-26 LAB — CBC WITH DIFFERENTIAL/PLATELET
Abs Immature Granulocytes: 0.03 10*3/uL (ref 0.00–0.07)
Basophils Absolute: 0 10*3/uL (ref 0.0–0.1)
Basophils Relative: 0 %
Eosinophils Absolute: 0.3 10*3/uL (ref 0.0–0.5)
Eosinophils Relative: 4 %
HCT: 33 % — ABNORMAL LOW (ref 39.0–52.0)
Hemoglobin: 11.1 g/dL — ABNORMAL LOW (ref 13.0–17.0)
Immature Granulocytes: 1 %
Lymphocytes Relative: 23 %
Lymphs Abs: 1.4 10*3/uL (ref 0.7–4.0)
MCH: 31 pg (ref 26.0–34.0)
MCHC: 33.6 g/dL (ref 30.0–36.0)
MCV: 92.2 fL (ref 80.0–100.0)
Monocytes Absolute: 0.5 10*3/uL (ref 0.1–1.0)
Monocytes Relative: 8 %
Neutro Abs: 3.9 10*3/uL (ref 1.7–7.7)
Neutrophils Relative %: 64 %
Platelets: 171 10*3/uL (ref 150–400)
RBC: 3.58 MIL/uL — ABNORMAL LOW (ref 4.22–5.81)
RDW: 13 % (ref 11.5–15.5)
WBC: 6 10*3/uL (ref 4.0–10.5)
nRBC: 0 % (ref 0.0–0.2)

## 2023-05-26 LAB — MULTIPLE MYELOMA PANEL, SERUM
Albumin SerPl Elph-Mcnc: 3.1 g/dL (ref 2.9–4.4)
Albumin/Glob SerPl: 1.3 (ref 0.7–1.7)
Alpha 1: 0.3 g/dL (ref 0.0–0.4)
Alpha2 Glob SerPl Elph-Mcnc: 0.7 g/dL (ref 0.4–1.0)
B-Globulin SerPl Elph-Mcnc: 0.9 g/dL (ref 0.7–1.3)
Gamma Glob SerPl Elph-Mcnc: 0.6 g/dL (ref 0.4–1.8)
Globulin, Total: 2.4 g/dL (ref 2.2–3.9)
IgA: 66 mg/dL (ref 61–437)
IgG (Immunoglobin G), Serum: 773 mg/dL (ref 603–1613)
IgM (Immunoglobulin M), Srm: 57 mg/dL (ref 15–143)
Total Protein ELP: 5.5 g/dL — ABNORMAL LOW (ref 6.0–8.5)

## 2023-05-26 LAB — COMPREHENSIVE METABOLIC PANEL
ALT: 13 U/L (ref 0–44)
AST: 21 U/L (ref 15–41)
Albumin: 3.3 g/dL — ABNORMAL LOW (ref 3.5–5.0)
Alkaline Phosphatase: 73 U/L (ref 38–126)
Anion gap: 5 (ref 5–15)
BUN: 35 mg/dL — ABNORMAL HIGH (ref 8–23)
CO2: 22 mmol/L (ref 22–32)
Calcium: 8.8 mg/dL — ABNORMAL LOW (ref 8.9–10.3)
Chloride: 126 mmol/L — ABNORMAL HIGH (ref 98–111)
Creatinine, Ser: 1.87 mg/dL — ABNORMAL HIGH (ref 0.61–1.24)
GFR, Estimated: 36 mL/min — ABNORMAL LOW (ref >60)
Glucose, Bld: 104 mg/dL — ABNORMAL HIGH (ref 70–99)
Potassium: 3 mmol/L — ABNORMAL LOW (ref 3.5–5.1)
Sodium: 153 mmol/L — ABNORMAL HIGH (ref 135–145)
Total Bilirubin: 0.6 mg/dL (ref 0.0–1.2)
Total Protein: 6.4 g/dL — ABNORMAL LOW (ref 6.5–8.1)

## 2023-05-26 LAB — GLUCOSE, CAPILLARY: Glucose-Capillary: 102 mg/dL — ABNORMAL HIGH (ref 70–99)

## 2023-05-26 LAB — PHOSPHORUS: Phosphorus: 3.6 mg/dL (ref 2.5–4.6)

## 2023-05-26 LAB — MAGNESIUM: Magnesium: 1.9 mg/dL (ref 1.7–2.4)

## 2023-05-26 MED ORDER — DEXTROSE 5 % IV SOLN: INTRAVENOUS | Status: AC

## 2023-05-26 MED ORDER — KCL IN DEXTROSE-NACL 20-5-0.45 MEQ/L-%-% IV SOLN
INTRAVENOUS | Status: DC
  Filled 2023-05-26: qty 1000

## 2023-05-26 MED ORDER — FLEET ENEMA RE ENEM
1.0000 | ENEMA | Freq: Once | RECTAL | Status: AC
  Administered 2023-05-26: 1 via RECTAL

## 2023-05-26 MED ORDER — MORPHINE SULFATE (PF) 2 MG/ML IV SOLN
2.0000 mg | INTRAVENOUS | Status: DC | PRN
  Administered 2023-05-26 – 2023-05-30 (×12): 2 mg via INTRAVENOUS
  Filled 2023-05-26 (×12): qty 1

## 2023-05-26 MED ORDER — TRAMADOL HCL 50 MG PO TABS
50.0000 mg | ORAL_TABLET | Freq: Every day | ORAL | Status: DC
  Administered 2023-05-26 – 2023-06-03 (×6): 50 mg via ORAL
  Filled 2023-05-26 (×8): qty 1

## 2023-05-26 NOTE — Progress Notes (Signed)
[facility]  ROUNDING NOTE   Subjective:   Patient seen sitting up in bed, alert No family present Requires assistance with meals  Calcium 8.8 Potassium 3.0 Sodium 153  Objective:  Vital signs in last 24 hours:  Temp:  [97.8 F (36.6 C)-100 F (37.8 C)] 100 F (37.8 C) (01/28 1100) Pulse Rate:  [81-100] 94 (01/28 1100) Resp:  [17-20] 19 (01/28 1100) BP: (127-163)/(77-96) 163/96 (01/28 1100) SpO2:  [94 %-98 %] 94 % (01/28 1100) Weight:  [70.2 kg] 70.2 kg (01/28 0300)  Weight change: 0 kg Filed Weights   05/24/23 0500 05/25/23 0219 05/26/23 0300  Weight: 71.5 kg 70.2 kg 70.2 kg    Intake/Output: I/O last 3 completed shifts: In: 666.6 [I.V.:666.6] Out: 2250 [Urine:2250]   Intake/Output this shift:  No intake/output data recorded.  Physical Exam: General: NAD  Head: Normocephalic, atraumatic. Moist oral mucosal membranes  Eyes: Anicteric  Lungs:  Clear to auscultation, normal effort  Heart: Regular rate and rhythm  Abdomen:  Soft, nontender  Extremities:  no peripheral edema.  Neurologic: Alert, moving all four extremities  Skin: No lesions       Basic Metabolic Panel: Recent Labs  Lab 05/22/23 0741 05/23/23 0458 05/24/23 0448 05/25/23 0608 05/26/23 0423  NA 151* 150* 149* 152* 153*  K 2.9* 3.0* 3.1* 3.0* 3.0*  CL 112* 112* 116* 119* 126*  CO2 28 27 24 23 22   GLUCOSE 123* 107* 83 94 104*  BUN 25* 26* 31* 38* 35*  CREATININE 2.44* 2.47* 2.38* 2.16* 1.87*  CALCIUM 12.8* 11.4* 10.2 9.6 8.8*  MG  --  1.7 1.8 1.9 1.9  PHOS  --  4.1 2.9 3.6 3.6    Liver Function Tests: Recent Labs  Lab 05/22/23 0741 05/23/23 0458 05/24/23 0448 05/25/23 0608 05/26/23 0423  AST 17 18 19 20 21   ALT 9 9 10 11 13   ALKPHOS 65 63 59 64 73  BILITOT 1.0 1.0 1.1 1.3* 0.6  PROT 6.1* 6.0* 5.7* 6.2* 6.4*  ALBUMIN 3.4* 3.3* 3.1* 3.2* 3.3*   No results for input(s): "LIPASE", "AMYLASE" in the last 168 hours. No results for input(s): "AMMONIA" in the last  168 hours.  CBC: Recent Labs  Lab 05/22/23 0741 05/23/23 0458 05/24/23 0448 05/25/23 0608 05/26/23 0423  WBC 5.6 4.0 4.6 5.1 6.0  NEUTROABS 4.5 2.8 2.5 3.0 3.9  HGB 11.0* 10.8* 10.3* 10.8* 11.1*  HCT 32.2* 31.8* 30.5* 31.2* 33.0*  MCV 89.7 91.6 91.0 90.2 92.2  PLT 152 130* 146* 161 171    Cardiac Enzymes: No results for input(s): "CKTOTAL", "CKMB", "CKMBINDEX", "TROPONINI" in the last 168 hours.  BNP: Invalid input(s): "POCBNP"  CBG: Recent Labs  Lab 05/25/23 0641 05/25/23 0758 05/25/23 1209 05/25/23 1546 05/26/23 0507  GLUCAP 84 91 104* 109* 102*    Microbiology: Results for orders placed or performed during the hospital encounter of 05/19/23  Resp panel by RT-PCR (RSV, Flu A&B, Covid) Anterior Nasal Swab     Status: None   Collection Time: 05/19/23 10:00 AM   Specimen: Anterior Nasal Swab  Result Value Ref Range Status   SARS Coronavirus 2 by RT PCR NEGATIVE NEGATIVE Final    Comment: (NOTE) SARS-CoV-2 target nucleic acids are NOT DETECTED.  The SARS-CoV-2 RNA is generally detectable in upper respiratory specimens during the acute phase of infection. The lowest concentration of SARS-CoV-2 viral copies this assay can detect is 138 copies/mL. A negative result does not preclude SARS-Cov-2 infection and should not be used as  the sole basis for treatment or other patient management decisions. A negative result may occur with  improper specimen collection/handling, submission of specimen other than nasopharyngeal swab, presence of viral mutation(s) within the areas targeted by this assay, and inadequate number of viral copies(<138 copies/mL). A negative result must be combined with clinical observations, patient history, and epidemiological information. The expected result is Negative.  Fact Sheet for Patients:  BloggerCourse.com  Fact Sheet for Healthcare Providers:  SeriousBroker.it  This test is no t yet  approved or cleared by the [place] FDA and  has been authorized for detection and/or diagnosis of SARS-CoV-2 by FDA under an Emergency Use Authorization (EUA). This EUA will remain  in effect (meaning this test can be used) for the duration of the COVID-19 declaration under Section 564(b)(1) of the Act, 21 U.S.C.section 360bbb-3(b)(1), unless the authorization is terminated  or revoked sooner.       Influenza A by PCR NEGATIVE NEGATIVE Final   Influenza B by PCR NEGATIVE NEGATIVE Final    Comment: (NOTE) The Xpert Xpress SARS-CoV-2/FLU/RSV plus assay is intended as an aid in the diagnosis of influenza from Nasopharyngeal swab specimens and should not be used as a sole basis for treatment. Nasal washings and aspirates are unacceptable for Xpert Xpress SARS-CoV-2/FLU/RSV testing.  Fact Sheet for Patients: BloggerCourse.com  Fact Sheet for Healthcare Providers: SeriousBroker.it  This test is not yet approved or cleared by the [place] FDA and has been authorized for detection and/or diagnosis of SARS-CoV-2 by FDA under an Emergency Use Authorization (EUA). This EUA will remain in effect (meaning this test can be used) for the duration of the COVID-19 declaration under Section 564(b)(1) of the Act, 21 U.S.C. section 360bbb-3(b)(1), unless the authorization is terminated or revoked.     Resp Syncytial Virus by PCR NEGATIVE NEGATIVE Final    Comment: (NOTE) Fact Sheet for Patients: BloggerCourse.com  Fact Sheet for Healthcare Providers: SeriousBroker.it  This test is not yet approved or cleared by the [place] FDA and has been authorized for detection and/or diagnosis of SARS-CoV-2 by FDA under an Emergency Use Authorization (EUA). This EUA will remain in effect (meaning this test can be used) for the duration of the COVID-19 declaration under Section 564(b)(1) of  the Act, 21 U.S.C. section 360bbb-3(b)(1), unless the authorization is terminated or revoked.  Performed at [facility], [address] [place], [place] [address]     Coagulation Studies: No results for input(s): "LABPROT", "INR" in the last 72 hours.  Urinalysis: No results for input(s): "COLORURINE", "LABSPEC", "PHURINE", "GLUCOSEU", "HGBUR", "BILIRUBINUR", "KETONESUR", "PROTEINUR", "UROBILINOGEN", "NITRITE", "LEUKOCYTESUR" in the last 72 hours.  Invalid input(s): "APPERANCEUR"    Imaging: US Carotid Bilateral Result Date: 05/25/2023 CLINICAL DATA:  Carotid bruit it and history of hypertension and hyperlipidemia. EXAM: BILATERAL CAROTID DUPLEX ULTRASOUND TECHNIQUE: Wallace Cullens scale imaging, color Doppler and duplex ultrasound were performed of bilateral carotid and vertebral arteries in the neck. COMPARISON:  None Available. FINDINGS: Criteria: Quantification of carotid stenosis is based on velocity parameters that correlate the residual internal carotid diameter with NASCET-based stenosis levels, using the diameter of the distal internal carotid lumen as the denominator for stenosis measurement. The following velocity measurements were obtained: RIGHT ICA:  67/16 cm/sec CCA:  80/12 cm/sec SYSTOLIC ICA/CCA RATIO:  0.8 ECA:  77 cm/sec LEFT ICA:  73/18 cm/sec CCA:  118/19 cm/sec SYSTOLIC ICA/CCA RATIO:  0.6 ECA:  84 cm/sec RIGHT CAROTID ARTERY: Mild amount partially calcified plaque at the level of the carotid  bulb and right ICA origin. Estimated right ICA stenosis is less than 50%. RIGHT VERTEBRAL ARTERY: Antegrade flow with normal waveform and velocity. LEFT CAROTID ARTERY: Mild amount of calcified plaque at the level of the distal common carotid artery, carotid bulb and proximal left ICA. Estimated left ICA stenosis is less than 50%. LEFT VERTEBRAL ARTERY: Antegrade flow with normal waveform and velocity. IMPRESSION: Mild amount of plaque in both carotid arteries. Estimated bilateral ICA  stenosis is less than 50%. Electronically Signed   By: Irish Lack M.D.   On: 05/25/2023 16:15     Medications:    dextrose 100 mL/hr at 05/26/23 1045    amLODipine  5 mg Oral Daily   enoxaparin (LOVENOX) injection  30 mg Subcutaneous Q24H   feeding supplement  237 mL Oral BID BM   QUEtiapine  25 mg Oral QHS   sodium chloride flush  3 mL Intravenous Q12H   traMADol  50 mg Oral Daily   acetaminophen, hydrALAZINE, labetalol, morphine injection, ondansetron **OR** ondansetron (ZOFRAN) IV, polyethylene glycol powder  Assessment/ Plan:  Mr. Justin Ali is a 83 y.o.  male with a PMHx of metastatic prostate cancer, hypertension, sinus bradycardia, end-stage dementia, syncope, nonalcoholic fatty liver disease, obstructive sleep apnea, peripheral neuropathy, history of colonic adenomas, restless leg syndrome, history of gastric ulcer, coronary artery disease.    Hypercalcemia likely secondary to metastatic prostate cancer.  Acute kidney injury.  Chronic kidney disease stage IIIa baseline creatinine 1.4  EGFR 50   Metastatic prostate cancer.   Hypernatremia    Hypokalemia   Lab Results  Component Value Date   CREATININE 1.87 (H) 05/26/2023   CREATININE 2.16 (H) 05/25/2023   CREATININE 2.38 (H) 05/24/2023    Intake/Output Summary (Last 24 hours) at 05/26/2023 1432 Last data filed at 05/26/2023 1120 Gross per 24 hour  Intake 0 ml  Output 1700 ml  Net -1700 ml   Assessment: Patient presents acute kidney injury with significant hypercalcemia with a calcium of 12.9 at admission.  Most likely related to metastatic prostate cancer.  Initially managed with normal saline.  Also given zoledronic acid 4 mg IV on 05/21/2023.  Plan: -Calcium 8.8 -Sodium 153, Potassium 3.0 -IVF: D5W with 20 mill equivalents of potassium to manage hypernatremia and hypokalemia - Awaiting multiple myeloma panel.   -Serum creatinine  1.87  Due to calcium correction and improved renal function, we will  sign off at this time.  Feel free to reconsult if needed.   LOS: 7 Calob Baskette 1/28/20252:32 PM

## 2023-05-26 NOTE — Progress Notes (Signed)
Occupational Therapy Treatment Patient Details Name: Justin Ali MRN: [id number] DOB: December 27, 1940 Today's Date: 05/26/2023   History of present illness Pt is an 83 year old male presenting with syncope, bradycardia, encephalopathy    PMH significant for prostate cancer, hypertension, sinus bradycardia, likely end-stage dementia   OT comments  Justin Ali was seen for OT treatment on this date. Upon arrival to room pt supine in bed in L lateral trunk flexion, PCA at bedside. OT attempts to facilitate ADL management as described below. See ADL section for additional details regarding occupational performance. Pt continues to be significantly functionally limited by decreased cognition, increased pain with mobility, and decreased activity tolerance. He appears to have had a decrease in functional status since time of initial OT evaluation, and continues to benefit from skilled OT services to maximize return to PLOF and minimize risk of future falls, injury, caregiver burden, and readmission. POC/DC recommendation updated to reflect current functional needs.       If plan is discharge home, recommend the following:  A little help with walking and/or transfers;A little help with bathing/dressing/bathroom;Supervision due to cognitive status;Direct supervision/assist for financial management;Direct supervision/assist for medications management   Equipment Recommendations  Other (comment) (defer)    Recommendations for Other Services      Precautions / Restrictions Precautions Precautions: Fall Precaution Comments: watch BP and oxygen Restrictions Weight Bearing Restrictions Per Provider Order: No       Mobility Bed Mobility Overal bed mobility: Needs Assistance Bed Mobility: Rolling Rolling: Max assist, +2 for physical assistance         General bed mobility comments: TOTAL A +2 to boost up in bed. Attempted to bring pt to midline positioning in bed, however he independently returns  to L lateral trunk flexion with head on bed railing hips shifted to R edge of bed after re-positioning attempts. RN in room, aware. Pt left with all 4 bed rails up to support positioning/maximize safety.    Transfers                         Balance                                           ADL either performed or assessed with clinical judgement   ADL Overall ADL's : Needs assistance/impaired Eating/Feeding: Maximal assistance;Bed level;Moderate assistance Eating/Feeding Details (indicate cue type and reason): RN attempts to give medications in ice cream this date. Pt does not actively participate in self-feeding, grimaces when spoon is brought to mouth, does not accept food/medications from RN Grooming: Bed level;Maximal assistance Grooming Details (indicate cue type and reason): Attempted UB grooming this date, however, pt limited active participation in tasks. He is noted to move head away from warm wash cloth when this author attempts to assist with face washing. Moaning in pain.                             Functional mobility during ADLs: +2 for physical assistance;Maximal assistance General ADL Comments: Education limited 2/2 baseline cognition. Attempted to support pt with falls prevention education and education on importance of regular activity with limited evidence of learning. Requires +2 MAX A to re-position/boost upright in bed. Pt maintains L lateral trunk flexion t/o session and yells out in pain with attempts to  bring into midline.    Extremity/Trunk Assessment              Vision       Perception     Praxis      Cognition Arousal: Alert Behavior During Therapy: Anxious, Flat affect Overall Cognitive Status: Impaired/Different from baseline Area of Impairment: Orientation, Safety/judgement, Following commands                 Orientation Level: Disoriented to, Situation, Time, Place, Person     Following  Commands: Follows one step commands inconsistently Safety/Judgement: Decreased awareness of deficits, Decreased awareness of safety     General Comments: Generally non-communicative t/o session. Primarily yells out in pain with even minimal attempts to adjust positioning in bed. RN in room to assess during session.        Exercises Other Exercises Other Exercises: OT facilitated ADL management with education/assist as described above.    Shoulder Instructions       General Comments      Pertinent Vitals/ Pain       Pain Assessment Pain Assessment: Faces Faces Pain Scale: Hurts worst Pain Location: Does not localize Pain Descriptors / Indicators: Grimacing, Guarding, Moaning Pain Intervention(s): Limited activity within patient's tolerance, Repositioned (RN attempts to give PO medication during session, however, pt refuses.)  Home Living                                          Prior Functioning/Environment              Frequency  Min 1X/week        Progress Toward Goals  OT Goals(current goals can now be found in the care plan section)  Progress towards OT goals: Not progressing toward goals - comment;Goals drowngraded-see care plan  Acute Rehab OT Goals Patient Stated Goal: to go home OT Goal Formulation: With patient Time For Goal Achievement: 06/03/23 Potential to Achieve Goals: Good  Plan      Co-evaluation                 AM-PAC OT "6 Clicks" Daily Activity     Outcome Measure   Help from another person eating meals?: A Lot Help from another person taking care of personal grooming?: A Lot Help from another person toileting, which includes using toliet, bedpan, or urinal?: Total Help from another person bathing (including washing, rinsing, drying)?: Total Help from another person to put on and taking off regular upper body clothing?: Total Help from another person to put on and taking off regular lower body clothing?:  Total 6 Click Score: 8    End of Session    OT Visit Diagnosis: Other abnormalities of gait and mobility (R26.89);Unsteadiness on feet (R26.81)   Activity Tolerance Patient limited by pain;Other (comment) (limited by cognition)   Patient Left in bed;with call bell/phone within reach;with bed alarm set;with chair alarm set   Nurse Communication          Time: [id number] OT Time Calculation (min): 16 min  Charges: OT General Charges $OT Visit: 1 Visit OT Treatments $Self Care/Home Management : 8-22 mins  Rockney Ghee, M.S., OTR/L 05/26/23, 11:57 AM

## 2023-05-26 NOTE — TOC Progression Note (Signed)
Transition of Care [facility]) - Progression Note    Patient Details  Name: Justin Ali MRN: [id number] Date of Birth: 1940/05/16  Transition of Care [facility] CM/SW Contact  Allena Katz, LCSW Phone Number: 05/26/2023, 11:27 AM  Clinical Narrative:   Recommendations changed to SNF but still not able to participate with therapy. CSW lvm for son to discuss.    Expected Discharge Plan: Home w Home Health Services Barriers to Discharge: Continued Medical Work up  Expected Discharge Plan and Services                                               Social Determinants of Health (SDOH) Interventions SDOH Screenings   Food Insecurity: No Food Insecurity (05/19/2023)  Housing: Low Risk  (05/19/2023)  Transportation Needs: No Transportation Needs (05/19/2023)  Utilities: Not At Risk (05/19/2023)  Alcohol Screen: Low Risk  (07/16/2022)  Depression (PHQ2-9): Low Risk  (03/13/2023)  Recent Concern: Depression (PHQ2-9) - High Risk (01/16/2023)  Financial Resource Strain: Low Risk  (07/16/2022)  Physical Activity: Insufficiently Active (07/16/2022)  Social Connections: Socially Isolated (05/19/2023)  Stress: Stress Concern Present (07/16/2022)  Tobacco Use: Medium Risk (05/19/2023)    Readmission Risk Interventions     No data to display

## 2023-05-26 NOTE — Plan of Care (Signed)

## 2023-05-26 NOTE — Progress Notes (Addendum)
PROGRESS NOTE    Justin Ali  ZOX:[id number] DOB: Jan 06, 1941 DOA: 05/19/2023 PCP: Eustaquio Boyden, MD  Chief Complaint  Patient presents with   Loss of Consciousness   Bradycardia    Hospital Course:  Justin Ali is 83 y.o. male with prostate cancer, hypertension, sinus bradycardia, dementia, who presented after syncopal episode in his independent living facility.  Found at the dining table slumped over.  Blood sugar on arrival 156, blood pressure 90/50, satting 80% on room air.  Denied any recent fever or chills.  ER workup reveals WBC 5.2, hemoglobin 9.3, platelets 149, COVID/flu/RSV negative.  Creatinine 2.65.  Chest x-ray stable apart from known metastatic prostate cancer.  Subjective: No acute events overnight. On evaluation today patient does not answer questions.   Objective: Vitals:   05/26/23 0059 05/26/23 0300 05/26/23 0349 05/26/23 0752  BP: (!) 150/90  (!) 155/85 (!) 144/86  Pulse: 82  93 89  Resp: 20  20 20   Temp: 98.9 F (37.2 C)  98.1 F (36.7 C) 98.1 F (36.7 C)  TempSrc:    Oral  SpO2: 97%  96% 98%  Weight:  70.2 kg    Height:        Intake/Output Summary (Last 24 hours) at 05/26/2023 0950 Last data filed at 05/26/2023 0502 Gross per 24 hour  Intake 370.99 ml  Output 1700 ml  Net -1329.01 ml   Filed Weights   05/24/23 0500 05/25/23 0219 05/26/23 0300  Weight: 71.5 kg 70.2 kg 70.2 kg    Examination: General exam: Appears calm and comfortable, NAD  Respiratory system: No work of breathing, symmetric chest wall expansion Cardiovascular system: S1 & S2 heard, RRR.  Gastrointestinal system: Abdomen is nondistended, soft and nontender.  Neuro: Awake, alert, follows some commands, reports  "I am where I am" when asked about location Extremities: Symmetric, expected ROM Skin: No rashes, lesions Psychiatry: Calm, otherwise difficult to assess  Assessment & Plan:  Principal Problem:   Syncope Active Problems:   Sinus bradycardia    Encephalopathy   Chronic kidney disease, stage 3a (HCC)   Prostate cancer metastatic to bone [facility])   Primary malignant 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 (HCC)   Hypercalcemia of malignancy   Hypokalemia   Hypernatremia   AKI (acute kidney injury) (HCC)   Metabolic Encephalopathy - multifactorial, electrolyte disturbance + history of dementia+ hospital delirium - Head CT 1/25 negative for acute intracranial abnormalities - Continues to improve, not quite at baseline. Persistent drowsiness today and still inability to eat independently.  Will proceed with brain MRI, possible metastatic spread to the brain versus small/evolving CVA not seen on CT - MRI pending read at this time - Infectious workup has thus far been negative - Dopplers negative for DVT  Syncope, multifactorial - Orthostatic Hypotension: BP 154/70, pulse 50 bpm.  Standing 115/75 pulse 97 bpm. - Dehydrated on arrival.  Echo reveals EF 55 to 60%, no significant diastolic dysfunction, patient is taking Lasix daily at home (for chronic pedal edema).  Have recommended we switch to PRN dosing - Patient also chronically bradycardic which may be playing a role - Carotid Dopplers: Mild amount of plaque in bilateral carotid arteries.  Bilateral ICA stenosis less than 50%.  Bradycardia - Resolved now - Appears chronic on review of prior EKGs - Echo mostly unremarkable  Hypercalcemia, resolved now --  status post zoledronic acid, IV fluids - Likely secondary to malignancy as patient has diffuse osteolytic lesions (redemonstrated on CXR and brain CT) -  Appears he has been taking calcium supplements outpatient - stopped - Alk phos within normal limits -- PTH low 8 -- MM panel pending - Nephrology consult, have now signed off - Onco consult  Hypokalemia Hyperchloremia Hypernatremia - Will discontinue current infusion in favor of D5W.  Replace potassium as needed - Nephrology consulted, appreciate their continued  assistance.  Dementia - Son reports at baseline patient has short-term memory deficits but is able to complete his ADLs, lives in an independent living facility - Driving status previously revoked - PT/OT evals pending - Head CT within normal limits on arrival, repeat 1/25 WNL - Continue nightly Seroquel - Continue delirium precautions  AKI superimposed on chronic kidney disease stage IIIa - Baseline creatinine 1-1.4 with GFR in 40s - Creatinine on arrival 2.65, improving now - Patient was clinically dry on arrival, MIVF as above - Hold nephrotoxic agents - Renally dose when needed  Metastatic prostate cancer - Metastatic 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  Given worsening electrolytes we will hold Zytiga for now. - Appears patient was previously on hospice with a joint decision of his daughter. His son, [name] has recently revoked hospice and is now requesting full aggressive care. - Have engaged oncology during this admission give worsening hypercalcemia -- PSA 1.56 -Have discussed benefits of palliative care  +/- hospice again with the patient's family, they are aware and considering all options - Have also discussed recommendations to proceed with the FDA approved medications only and to avoid additional supplements/meds including ivermectin and fenbendazole to avoid complicating his current clinical picture  DVT prophylaxis: lovenox CODE STATUS: DNR/DNI Family Communication: Discussed with caregiver at bedside, and son [name] on the phone. Disposition:  Status is: Inpatient, TOC consult for SNF.  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 not yet medically cleared, pending brain MRI.    Consultants:    Treatment Team:  Consulting Physician: Mady Haagensen, MD  Procedures:    Antimicrobials:   Anti-infectives (From admission, onward)    None       Data Reviewed: I have personally reviewed following labs and imaging studies CBC: Recent Labs  Lab 05/22/23 0741 05/23/23 0458 05/24/23 0448 05/25/23 0608 05/26/23 0423  WBC 5.6 4.0 4.6 5.1 6.0  NEUTROABS 4.5 2.8 2.5 3.0 3.9  HGB 11.0* 10.8* 10.3* 10.8* 11.1*  HCT 32.2* 31.8* 30.5* 31.2* 33.0*  MCV 89.7 91.6 91.0 90.2 92.2  PLT 152 130* 146* 161 171   Basic Metabolic Panel: Recent Labs  Lab 05/22/23 0741 05/23/23 0458 05/24/23 0448 05/25/23 0608 05/26/23 0423  NA 151* 150* 149* 152* 153*  K 2.9* 3.0* 3.1* 3.0* 3.0*  CL 112* 112* 116* 119* 126*  CO2 28 27 24 23 22   GLUCOSE 123* 107* 83 94 104*  BUN 25* 26* 31* 38* 35*  CREATININE 2.44* 2.47* 2.38* 2.16* 1.87*  CALCIUM 12.8* 11.4* 10.2 9.6 8.8*  MG  --  1.7 1.8 1.9 1.9  PHOS  --  4.1 2.9 3.6 3.6   GFR: Estimated Creatinine Clearance: 28 mL/min (A) (by C-G formula based on SCr of 1.87 mg/dL (H)). Liver Function Tests: Recent Labs  Lab 05/22/23 0741 05/23/23 0458 05/24/23 0448 05/25/23 0608 05/26/23 0423  AST 17 18 19 20 21   ALT 9 9 10 11 13   ALKPHOS 65 63 59 64 73  BILITOT  1.0 1.0 1.1 1.3* 0.6  PROT 6.1* 6.0* 5.7* 6.2* 6.4*  ALBUMIN 3.4* 3.3* 3.1* 3.2* 3.3*   CBG: Recent Labs  Lab 05/25/23 0641 05/25/23 0758 05/25/23 1209 05/25/23 1546 05/26/23 0507  GLUCAP 84 91 104* 109* 102*    Recent Results (from the past 240 hours)  Resp panel by RT-PCR (RSV, Flu A&B, Covid) Anterior Nasal Swab     Status: None   Collection Time: 05/19/23 10:00 AM   Specimen: Anterior Nasal Swab  Result Value Ref Range Status   SARS Coronavirus 2 by RT PCR NEGATIVE NEGATIVE Final    Comment: (NOTE) SARS-CoV-2 target nucleic acids are NOT DETECTED.  The SARS-CoV-2 RNA is generally detectable in upper respiratory specimens during the acute phase of infection. The lowest concentration of SARS-CoV-2 viral copies this assay can detect is 138 copies/mL. A negative  result does not preclude SARS-Cov-2 infection and should not be used as the sole basis for treatment or other patient management decisions. A negative result may occur with  improper specimen collection/handling, submission of specimen other than nasopharyngeal swab, presence of viral mutation(s) within the areas targeted by this assay, and inadequate number of viral copies(<138 copies/mL). A negative result must be combined with clinical observations, patient history, and epidemiological information. The expected result is Negative.  Fact Sheet for Patients:  BloggerCourse.com  Fact Sheet for Healthcare Providers:  SeriousBroker.it  This test is no t yet approved or cleared by the [place] FDA and  has been authorized for detection and/or diagnosis of SARS-CoV-2 by FDA under an Emergency Use Authorization (EUA). This EUA will remain  in effect (meaning this test can be used) for the duration of the COVID-19 declaration under Section 564(b)(1) of the Act, 21 U.S.C.section 360bbb-3(b)(1), unless the authorization is terminated  or revoked sooner.       Influenza A by PCR NEGATIVE NEGATIVE Final   Influenza B by PCR NEGATIVE NEGATIVE Final    Comment: (NOTE) The Xpert Xpress SARS-CoV-2/FLU/RSV plus assay is intended as an aid in the diagnosis of influenza from Nasopharyngeal swab specimens and should not be used as a sole basis for treatment. Nasal washings and aspirates are unacceptable for Xpert Xpress SARS-CoV-2/FLU/RSV testing.  Fact Sheet for Patients: BloggerCourse.com  Fact Sheet for Healthcare Providers: SeriousBroker.it  This test is not yet approved or cleared by the [place] FDA and has been authorized for detection and/or diagnosis of SARS-CoV-2 by FDA under an Emergency Use Authorization (EUA). This EUA will remain in effect (meaning this test can be used)  for the duration of the COVID-19 declaration under Section 564(b)(1) of the Act, 21 U.S.C. section 360bbb-3(b)(1), unless the authorization is terminated or revoked.     Resp Syncytial Virus by PCR NEGATIVE NEGATIVE Final    Comment: (NOTE) Fact Sheet for Patients: BloggerCourse.com  Fact Sheet for Healthcare Providers: SeriousBroker.it  This test is not yet approved or cleared by the [place] FDA and has been authorized for detection and/or diagnosis of SARS-CoV-2 by FDA under an Emergency Use Authorization (EUA). This EUA will remain in effect (meaning this test can be used) for the duration of the COVID-19 declaration under Section 564(b)(1) of the Act, 21 U.S.C. section 360bbb-3(b)(1), unless the authorization is terminated or revoked.  Performed at [facility], [address], [place], [place] [address]      Radiology Studies: US Carotid Bilateral Result Date: 05/25/2023 CLINICAL DATA:  Carotid bruit it and history of hypertension and hyperlipidemia. EXAM: BILATERAL CAROTID DUPLEX  ULTRASOUND TECHNIQUE: Wallace Cullens scale imaging, color Doppler and duplex ultrasound were performed of bilateral carotid and vertebral arteries in the neck. COMPARISON:  None Available. FINDINGS: Criteria: Quantification of carotid stenosis is based on velocity parameters that correlate the residual internal carotid diameter with NASCET-based stenosis levels, using the diameter of the distal internal carotid lumen as the denominator for stenosis measurement. The following velocity measurements were obtained: RIGHT ICA:  67/16 cm/sec CCA:  80/12 cm/sec SYSTOLIC ICA/CCA RATIO:  0.8 ECA:  77 cm/sec LEFT ICA:  73/18 cm/sec CCA:  118/19 cm/sec SYSTOLIC ICA/CCA RATIO:  0.6 ECA:  84 cm/sec RIGHT CAROTID ARTERY: Mild amount partially calcified plaque at the level of the carotid bulb and right ICA origin. Estimated right ICA stenosis is less than 50%. RIGHT  VERTEBRAL ARTERY: Antegrade flow with normal waveform and velocity. LEFT CAROTID ARTERY: Mild amount of calcified plaque at the level of the distal common carotid artery, carotid bulb and proximal left ICA. Estimated left ICA stenosis is less than 50%. LEFT VERTEBRAL ARTERY: Antegrade flow with normal waveform and velocity. IMPRESSION: Mild amount of plaque in both carotid arteries. Estimated bilateral ICA stenosis is less than 50%. Electronically Signed   By: Irish Lack M.D.   On: 05/25/2023 16:15     Scheduled Meds:  amLODipine  5 mg Oral Daily   enoxaparin (LOVENOX) injection  30 mg Subcutaneous Q24H   feeding supplement  237 mL Oral BID BM   QUEtiapine  25 mg Oral QHS   sodium chloride flush  3 mL Intravenous Q12H   sodium phosphate  1 enema Rectal Once   traMADol  50 mg Oral Daily   Continuous Infusions:  dextrose 5 % and 0.45 % NaCl with KCl 20 mEq/L 75 mL/hr at 05/26/23 0940      LOS: 7 days    Time spent:   Debarah Crape, DO [facility]  To contact the attending physician between 7A-7P please use Epic Chat. To contact the covering physician during after hours 7P-7A, please review Amion.   05/26/2023, 9:50 AM   *This document has been created with the assistance of dictation software. Please excuse typographical errors. *

## 2023-05-27 DIAGNOSIS — N179 Acute kidney failure, unspecified: Secondary | ICD-10-CM | POA: Diagnosis not present

## 2023-05-27 DIAGNOSIS — R52 Pain, unspecified: Secondary | ICD-10-CM

## 2023-05-27 DIAGNOSIS — R55 Syncope and collapse: Secondary | ICD-10-CM | POA: Diagnosis not present

## 2023-05-27 LAB — CBC WITH DIFFERENTIAL/PLATELET
Abs Immature Granulocytes: 0.06 10*3/uL (ref 0.00–0.07)
Basophils Absolute: 0 10*3/uL (ref 0.0–0.1)
Basophils Relative: 0 %
Eosinophils Absolute: 0.2 10*3/uL (ref 0.0–0.5)
Eosinophils Relative: 2 %
HCT: 34.3 % — ABNORMAL LOW (ref 39.0–52.0)
Hemoglobin: 11.5 g/dL — ABNORMAL LOW (ref 13.0–17.0)
Immature Granulocytes: 1 %
Lymphocytes Relative: 18 %
Lymphs Abs: 1.5 10*3/uL (ref 0.7–4.0)
MCH: 31 pg (ref 26.0–34.0)
MCHC: 33.5 g/dL (ref 30.0–36.0)
MCV: 92.5 fL (ref 80.0–100.0)
Monocytes Absolute: 0.7 10*3/uL (ref 0.1–1.0)
Monocytes Relative: 8 %
Neutro Abs: 5.9 10*3/uL (ref 1.7–7.7)
Neutrophils Relative %: 71 %
Platelets: 194 10*3/uL (ref 150–400)
RBC: 3.71 MIL/uL — ABNORMAL LOW (ref 4.22–5.81)
RDW: 13.2 % (ref 11.5–15.5)
WBC: 8.3 10*3/uL (ref 4.0–10.5)
nRBC: 0 % (ref 0.0–0.2)

## 2023-05-27 LAB — COMPREHENSIVE METABOLIC PANEL
ALT: 15 U/L (ref 0–44)
AST: 22 U/L (ref 15–41)
Albumin: 3.2 g/dL — ABNORMAL LOW (ref 3.5–5.0)
Alkaline Phosphatase: 76 U/L (ref 38–126)
Anion gap: 15 (ref 5–15)
BUN: 32 mg/dL — ABNORMAL HIGH (ref 8–23)
CO2: 22 mmol/L (ref 22–32)
Calcium: 8.1 mg/dL — ABNORMAL LOW (ref 8.9–10.3)
Chloride: 118 mmol/L — ABNORMAL HIGH (ref 98–111)
Creatinine, Ser: 1.83 mg/dL — ABNORMAL HIGH (ref 0.61–1.24)
GFR, Estimated: 37 mL/min — ABNORMAL LOW (ref >60)
Glucose, Bld: 133 mg/dL — ABNORMAL HIGH (ref 70–99)
Potassium: 2.6 mmol/L — CL (ref 3.5–5.1)
Sodium: 155 mmol/L — ABNORMAL HIGH (ref 135–145)
Total Bilirubin: 1 mg/dL (ref 0.0–1.2)
Total Protein: 6.2 g/dL — ABNORMAL LOW (ref 6.5–8.1)

## 2023-05-27 LAB — GLUCOSE, CAPILLARY
Glucose-Capillary: 115 mg/dL — ABNORMAL HIGH (ref 70–99)
Glucose-Capillary: 97 mg/dL (ref 70–99)

## 2023-05-27 LAB — POTASSIUM: Potassium: 3.1 mmol/L — ABNORMAL LOW (ref 3.5–5.1)

## 2023-05-27 LAB — MAGNESIUM: Magnesium: 1.8 mg/dL (ref 1.7–2.4)

## 2023-05-27 LAB — PHOSPHORUS: Phosphorus: 4.9 mg/dL — ABNORMAL HIGH (ref 2.5–4.6)

## 2023-05-27 MED ORDER — DEXTROSE 5 % IV SOLN: INTRAVENOUS | Status: DC

## 2023-05-27 MED ORDER — POTASSIUM CHLORIDE 10 MEQ/100ML IV SOLN
10.0000 meq | INTRAVENOUS | Status: AC
Start: 2023-05-27 — End: 2023-05-27
  Administered 2023-05-27 (×5): 10 meq via INTRAVENOUS
  Filled 2023-05-27 (×4): qty 100

## 2023-05-27 NOTE — Plan of Care (Signed)

## 2023-05-27 NOTE — TOC Progression Note (Signed)
Transition of Care [facility]) - Progression Note    Patient Details  Name: Justin Ali MRN: [id number] Date of Birth: [birth year]  [facility]) CM/SW Contact  Allena Katz, LCSW Phone Number: 05/27/2023, 3:05 PM  Clinical Narrative:   Per [employer] pt is only 40 percent service connected. Csw tried to contact son to tell him that but unable to reach.    Expected Discharge Plan: Home w Home Health Services Barriers to Discharge: Continued Medical Work up  Expected Discharge Plan and Services                                               Social Determinants of Health (SDOH) Interventions SDOH Screenings   Food Insecurity: No Food Insecurity (05/19/2023)  Housing: Low Risk  (05/19/2023)  Transportation Needs: No Transportation Needs (05/19/2023)  Utilities: Not At Risk (05/19/2023)  Alcohol Screen: Low Risk  (07/16/2022)  Depression (PHQ2-9): Low Risk  (03/13/2023)  Recent Concern: Depression (PHQ2-9) - High Risk (01/16/2023)  Financial Resource Strain: Low Risk  (07/16/2022)  Physical Activity: Insufficiently Active (07/16/2022)  Social Connections: Socially Isolated (05/19/2023)  Stress: Stress Concern Present (07/16/2022)  Tobacco Use: Medium Risk (05/19/2023)    Readmission Risk Interventions     No data to display

## 2023-05-27 NOTE — Progress Notes (Signed)
Progress Note   Patient: Justin Ali XBM:[id number] DOB: May 08, 1940 DOA: 05/19/2023     8 DOS: the patient was seen and examined on 05/27/2023   Brief hospital course: Justin Ali is 83 y.o. male with prostate cancer, hypertension, sinus bradycardia, dementia, who presented after syncopal episode in his independent living facility.  Found at the dining table slumped over.  Blood sugar on arrival 156, blood pressure 90/50, satting 80% on room air.  Denied any recent fever or chills.  ER workup reveals WBC 5.2, hemoglobin 9.3, platelets 149, COVID/flu/RSV negative.  Creatinine 2.65.  Chest x-ray stable apart from known metastatic prostate cancer.    Assessment and Plan:  Metabolic Encephalopathy - multifactorial, electrolyte disturbance + history of dementia+ hospital delirium - Head CT 1/25 negative for acute intracranial abnormalities - Continues to improve, not quite at baseline. Persistent drowsiness today and still inability to eat independently.  Will proceed with brain MRI, possible metastatic spread to the brain versus small/evolving CVA not seen on CT - MRI of the brain did not show any acute or subacute infarct Continue to monitor neurochecks - Infectious workup has thus far been negative - Dopplers negative for DVT Speech therapist consulted for swallow eval   Syncope, multifactorial - Orthostatic Hypotension: BP 154/70, pulse 50 bpm.  Standing 115/75 pulse 97 bpm. - Dehydrated on arrival.  Echo reveals EF 55 to 60%, no significant diastolic dysfunction, patient is taking Lasix daily at home (for chronic pedal edema).  Have recommended we switch to PRN dosing - Patient also chronically bradycardic which may be playing a role - Carotid Dopplers: Mild amount of plaque in bilateral carotid arteries.  Bilateral ICA stenosis less than 50%.   Bradycardia - Resolved now - Appears chronic on review of prior EKGs - Echo mostly unremarkable   Hypercalcemia, resolved now --   status post zoledronic acid, IV fluids - Likely secondary to malignancy as patient has diffuse osteolytic lesions (redemonstrated on CXR and brain CT) - Appears he has been taking calcium supplements outpatient - stopped - Alk phos within normal limits -- PTH low 8 -- MM panel pending - Nephrology have signed off - Oncology is consulted   Hypokalemia Hyperchloremia Hypernatremia Continue D5W Continue potassium repletion and monitoring   Dementia - Son reports at baseline patient has short-term memory deficits but is able to complete his ADLs, lives in an independent living facility - Driving status previously revoked - PT OT on board - Head CT within normal limits on arrival, repeat 1/25 WNL - Continue nightly Seroquel - Continue delirium precaution   AKI superimposed on chronic kidney disease stage IIIa - Baseline creatinine 1-1.4 with GFR in 40s - Creatinine on arrival 2.65, improving now - Patient was clinically dry on arrival, MIVF as above - Hold nephrotoxic agents - Renally dose when needed   Metastatic prostate cancer - Metastatic 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  Given worsening electrolytes we will hold Zytiga for now. - Appears patient was previously on hospice with a joint decision of his daughter. His son, [name] has recently revoked hospice and is now requesting full aggressive care. - Have engaged oncology during this admission give worsening hypercalcemia -- PSA 1.56 -Have discussed benefits of palliative care  +/- hospice again with the patient's family, they are aware and considering all options - Have also discussed recommendations to proceed with  the FDA approved medications only and to avoid additional supplements/meds including ivermectin and fenbendazole to avoid complicating his current clinical picture   DVT  prophylaxis: lovenox CODE STATUS: DNR/DNI Family Communication: Discussed with caregiver at bedside, and son [name] on the phone. Disposition:  Status is: Inpatient, TOC consult for SNF.  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  Gastrointestinal system: Abdomen is nondistended, soft and nontender.  Neuro: Awake, alert, follows some commands, reports  "I am where I am" when asked about location Extremities: Symmetric, expected ROM Skin: No rashes, lesions Psychiatry: Calm, otherwise difficult to assess  Vitals:   05/27/23 0353 05/27/23 0835 05/27/23 1226 05/27/23 1622  BP: 125/69 136/83 130/71 134/78  Pulse: (!) 106 (!) 109 (!) 107   Resp: 19 19 17 18   Temp: 99.4 F (37.4 C) 99.8 F (37.7 C) 97.9 F (36.6 C) (!) 101 F (38.3 C)  TempSrc:    Oral  SpO2: 95% 100% 96% 97%  Weight:      Height:        Data Reviewed    Latest Ref Rng & Units 05/27/2023    4:22 AM 05/26/2023    4:23 AM 05/25/2023    6:08 AM  BMP  Glucose 70 - 99 mg/dL 409  811  94   BUN 8 - 23 mg/dL 32  35  38   Creatinine 0.61 - 1.24 mg/dL 9.14  7.82  9.56   Sodium 135 - 145 mmol/L 155  153  152   Potassium 3.5 - 5.1 mmol/L 2.6  3.0  3.0   Chloride 98 - 111 mmol/L 118  126  119   CO2 22 - 32 mmol/L 22  22  23    Calcium 8.9 - 10.3 mg/dL 8.1  8.8  9.6        Latest Ref Rng & Units 05/27/2023    4:22 AM 05/26/2023    4:23 AM 05/25/2023    6:08 AM  CBC  WBC 4.0 - 10.5 K/uL 8.3  6.0  5.1   Hemoglobin 13.0 - 17.0 g/dL 21.3  08.6  57.8   Hematocrit 39.0 - 52.0 % 34.3  33.0  31.2   Platelets 150 - 400 K/uL  194  171  161     Author: Loyce Dys, MD 05/27/2023 6:39 PM  For on call review [email or web address].

## 2023-05-27 NOTE — TOC Progression Note (Signed)
Transition of Care [facility]) - Progression Note    Patient Details  Name: Justin Ali MRN: [id number] Date of Birth: 1940/06/12  [facility]) CM/SW Contact  Allena Katz, LCSW Phone Number: 05/27/2023, 11:50 AM  Clinical Narrative:   CSW spoke with son who reports he would like his dad to go under a [facility] with [facility]. CSW to email cathy [email or web address].    Expected Discharge Plan: Home w Home Health Services Barriers to Discharge: Continued Medical Work up  Expected Discharge Plan and Services                                               Social Determinants of Health (SDOH) Interventions SDOH Screenings   Food Insecurity: No Food Insecurity (05/19/2023)  Housing: Low Risk  (05/19/2023)  Transportation Needs: No Transportation Needs (05/19/2023)  Utilities: Not At Risk (05/19/2023)  Alcohol Screen: Low Risk  (07/16/2022)  Depression (PHQ2-9): Low Risk  (03/13/2023)  Recent Concern: Depression (PHQ2-9) - High Risk (01/16/2023)  Financial Resource Strain: Low Risk  (07/16/2022)  Physical Activity: Insufficiently Active (07/16/2022)  Social Connections: Socially Isolated (05/19/2023)  Stress: Stress Concern Present (07/16/2022)  Tobacco Use: Medium Risk (05/19/2023)    Readmission Risk Interventions     No data to display

## 2023-05-27 NOTE — Progress Notes (Signed)
Spoke with patient's daughter-in-law Revonda Standard (with permission of son). Provided brief update. TOC messaged with information

## 2023-05-27 NOTE — Progress Notes (Signed)
Critical Potassium 2.6   RN notified Larkin Ina, NP of the above information via phone call/page  NP states he will put in replacement shortly.

## 2023-05-27 NOTE — NC FL2 (Signed)
[place] MEDICAID FL2 LEVEL OF CARE FORM     IDENTIFICATION  Patient Name: Justin Ali Birthdate: Mar 15, 1941 Sex: male Admission Date (Current Location): 05/19/2023  [facility] and [place] Number:  Chiropodist and Address:  [facility], [address], [place], [place] [address]      Provider Number: [id number]  Attending Physician Name and Address:  Loyce Dys, MD  Relative Name and Phone Number:  Kurk, Corniel)  [phone]    Current Level of Care: Hospital Recommended Level of Care: Skilled Nursing Facility Prior Approval Number:    Date Approved/Denied:   PASRR Number: [id number]  Discharge Plan: SNF    Current Diagnoses: Patient Active Problem List   Diagnosis Date Noted   Hypercalcemia of malignancy 05/22/2023   Hypokalemia 05/22/2023   Hypernatremia 05/22/2023   AKI (acute kidney injury) (HCC) 05/22/2023   Syncope 05/19/2023   Encephalopathy 05/19/2023   Sinus bradycardia 05/19/2023   Chronic pain syndrome 12/21/2022   Sore on leg 11/05/2022   Pedal edema 10/13/2022   Constipation 10/13/2022   Positive RPR test 08/22/2022   Cognitive and behavioral changes 03/18/2022   Memory deficit 03/15/2022   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   History of GI bleed 01/02/2022   Chronic kidney disease, stage 3a (HCC)    Prostate cancer metastatic to bone [facility])    History of gastric ulcer    Low serum vitamin B12 07/10/2021   Bilateral flank pain 11/21/2020   Tinea cruris 07/13/2020   Choroidal lesion 08/19/2019   Combined forms of age-related cataract of both eyes 08/19/2019   DNR (do not resuscitate) 07/04/2019   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   Metastasis from malignant neoplasm of prostate (HCC) 06/24/2019   Nuclear sclerosis of both eyes 06/24/2019   Laxity of eyelid 01/16/2018   Chronic insomnia 01/02/2017   Primary malignant  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 (HCC) 09/24/2016   Benign prostatic hyperplasia 06/12/2015   Advanced care planning/counseling discussion 06/07/2014   Unintentional weight loss 06/07/2014   Hearing loss due to cerumen impaction 06/07/2014   CAD (coronary artery disease), native coronary artery 06/02/2013   Angiodysplasia of cecum 03/18/2013   History of colonic polyps 08/05/2012   Medicare annual wellness visit, subsequent 06/02/2012   Health maintenance examination 06/02/2011   Fatty liver 06/02/2011   HYPERTENSION, BENIGN ESSENTIAL 11/02/2006   Dyslipidemia 10/27/2006   RESTLESS LEG SYNDROME 10/27/2006   Hereditary and idiopathic peripheral neuropathy 10/27/2006   Sleep apnea 10/27/2006    Orientation RESPIRATION BLADDER Height & Weight     Self  Normal Incontinent Weight: 150 lb 2.1 oz (68.1 kg) Height:  5\' 6"  (167.6 cm)  BEHAVIORAL SYMPTOMS/MOOD NEUROLOGICAL BOWEL NUTRITION STATUS      Continent    AMBULATORY STATUS COMMUNICATION OF NEEDS Skin   Extensive Assist Verbally Normal                       Personal Care Assistance Level of Assistance  Bathing, Feeding, Dressing Bathing Assistance: Maximum assistance Feeding assistance: Limited assistance Dressing Assistance: Maximum assistance     Functional Limitations Info  Sight, Hearing, Speech Sight Info: Adequate Hearing Info: Impaired Speech Info: Impaired    SPECIAL CARE FACTORS FREQUENCY  PT (By licensed PT), OT (By licensed OT)     PT Frequency: 5 times a week OT Frequency: 5 times a WEEK  Contractures Contractures Info: Not present    Additional Factors Info  Code Status Code Status Info: DNR             Current Medications (05/27/2023):  This is the current hospital active medication list Current Facility-Administered Medications  Medication Dose Route Frequency Provider Last Rate Last Admin   acetaminophen (TYLENOL) tablet 650 mg  650 mg Oral Q6H PRN Manuela Schwartz,  NP   650 mg at 05/23/23 1846   amLODipine (NORVASC) tablet 5 mg  5 mg Oral Daily Dezii, Alexandra, DO   5 mg at 05/26/23 0930   dextrose 5 % solution   Intravenous Continuous Rosezetta Schlatter T, MD 100 mL/hr at 05/27/23 1400 New Bag at 05/27/23 1400   enoxaparin (LOVENOX) injection 30 mg  30 mg Subcutaneous Q24H Floydene Flock, MD   30 mg at 05/26/23 2323   feeding supplement (ENSURE ENLIVE / ENSURE PLUS) liquid 237 mL  237 mL Oral BID BM Dezii, Alexandra, DO   237 mL at 05/24/23 1008   hydrALAZINE (APRESOLINE) injection 10 mg  10 mg Intravenous Q6H PRN Dezii, Alexandra, DO   10 mg at 05/25/23 1234   labetalol (NORMODYNE) injection 10 mg  10 mg Intravenous Q2H PRN Dezii, Alexandra, DO   10 mg at 05/22/23 1707   morphine (PF) 2 MG/ML injection 2 mg  2 mg Intravenous Q4H PRN Dezii, Alexandra, DO   2 mg at 05/27/23 1238   ondansetron (ZOFRAN) tablet 4 mg  4 mg Oral Q6H PRN Floydene Flock, MD       Or   ondansetron [facility]) injection 4 mg  4 mg Intravenous Q6H PRN Floydene Flock, MD   4 mg at 05/22/23 0225   polyethylene glycol powder (GLYCOLAX/MIRALAX) container 8.5 g  8.5 g Oral Daily PRN Dezii, Alexandra, DO       QUEtiapine (SEROQUEL) tablet 25 mg  25 mg Oral QHS Dezii, Alexandra, DO   25 mg at 05/25/23 2226   sodium chloride flush (NS) 0.9 % injection 3 mL  3 mL Intravenous Q12H Floydene Flock, MD   3 mL at 05/27/23 1403   traMADol (ULTRAM) tablet 50 mg  50 mg Oral Daily Dezii, Alexandra, DO   50 mg at 05/26/23 0930     Discharge Medications: Please see discharge summary for a list of discharge medications.  Relevant Imaging Results:  Relevant Lab Results:   Additional Information [id number]  Allena Katz, LCSW

## 2023-05-27 NOTE — Progress Notes (Signed)
  Media Information   Document Information  Photos  Back rash  05/27/2023 04:32  Attached To:  Hospital Encounter on 05/19/23  Source Information  Jorge Ny, RN  [facility]  Document History    Patient appears uncomfortable in the bed. RN and NT turned patient to his R side and RN noticed rash with pustules had developed on his back.   On previous assessment, the patient had some slight red bumps, but it did not appear to be bothering him and there were no pustules.   Also, Patient's L leg appears to be fixed in dorsiflexion with knee bent. L knee still very swollen and very painful. L arm is very rigid and patient winces when RN attempts to help him bend the L arm.   RN notified Larkin Ina, NP of the above information.    Orders Received: give prn pain medication. document and pass it report to be followed with the MD. Jeannie Fend

## 2023-05-27 NOTE — Progress Notes (Signed)
PT Cancellation Note  Patient Details Name: Justin Ali MRN: [id number] DOB: 1940-12-20   Cancelled Treatment:    Reason Eval/Treat Not Completed: Other (comment). Consult received and chart reviewed. Patient noted with critically low K+ , per PT practice guidelines contraindicated for exertional activity at this time.  Will continue efforts next date pending medical stability and appropriateness.  Olga Coaster PT, DPT 12:59 PM,05/27/23

## 2023-05-27 NOTE — Progress Notes (Signed)
During assessment, patient's L knee was noted to be swollen and severely painful to the touch. Patient has +2  femoral, dorsalis pedis, and posterior tibialis bilaterally.  Thi is different from previous assessments. To this RN's knowledge there has been no trauma to the patient's L leg.   RN notified Larkin Ina, NP of the above information.

## 2023-05-27 NOTE — TOC Progression Note (Signed)
Transition of Care [facility]) - Progression Note    Patient Details  Name: Justin Ali MRN: [id number] Date of Birth: 1940-07-07  Transition of Care [facility]) CM/SW Contact  Allena Katz, LCSW Phone Number: 05/27/2023, 11:28 AM  Clinical Narrative:   CSW attempted to contact son again about rehab. Pt still axo1.    Expected Discharge Plan: Home w Home Health Services Barriers to Discharge: Continued Medical Work up  Expected Discharge Plan and Services                                               Social Determinants of Health (SDOH) Interventions SDOH Screenings   Food Insecurity: No Food Insecurity (05/19/2023)  Housing: Low Risk  (05/19/2023)  Transportation Needs: No Transportation Needs (05/19/2023)  Utilities: Not At Risk (05/19/2023)  Alcohol Screen: Low Risk  (07/16/2022)  Depression (PHQ2-9): Low Risk  (03/13/2023)  Recent Concern: Depression (PHQ2-9) - High Risk (01/16/2023)  Financial Resource Strain: Low Risk  (07/16/2022)  Physical Activity: Insufficiently Active (07/16/2022)  Social Connections: Socially Isolated (05/19/2023)  Stress: Stress Concern Present (07/16/2022)  Tobacco Use: Medium Risk (05/19/2023)    Readmission Risk Interventions     No data to display

## 2023-05-28 DIAGNOSIS — R55 Syncope and collapse: Secondary | ICD-10-CM | POA: Diagnosis not present

## 2023-05-28 DIAGNOSIS — N179 Acute kidney failure, unspecified: Secondary | ICD-10-CM | POA: Diagnosis not present

## 2023-05-28 LAB — CBC WITH DIFFERENTIAL/PLATELET
Abs Immature Granulocytes: 0.09 10*3/uL — ABNORMAL HIGH (ref 0.00–0.07)
Basophils Absolute: 0 10*3/uL (ref 0.0–0.1)
Basophils Relative: 0 %
Eosinophils Absolute: 0.2 10*3/uL (ref 0.0–0.5)
Eosinophils Relative: 2 %
HCT: 32.2 % — ABNORMAL LOW (ref 39.0–52.0)
Hemoglobin: 10.7 g/dL — ABNORMAL LOW (ref 13.0–17.0)
Immature Granulocytes: 1 %
Lymphocytes Relative: 17 %
Lymphs Abs: 1.7 10*3/uL (ref 0.7–4.0)
MCH: 30.1 pg (ref 26.0–34.0)
MCHC: 33.2 g/dL (ref 30.0–36.0)
MCV: 90.7 fL (ref 80.0–100.0)
Monocytes Absolute: 0.7 10*3/uL (ref 0.1–1.0)
Monocytes Relative: 7 %
Neutro Abs: 7.4 10*3/uL (ref 1.7–7.7)
Neutrophils Relative %: 73 %
Platelets: 200 10*3/uL (ref 150–400)
RBC: 3.55 MIL/uL — ABNORMAL LOW (ref 4.22–5.81)
RDW: 13.1 % (ref 11.5–15.5)
WBC: 10 10*3/uL (ref 4.0–10.5)
nRBC: 0 % (ref 0.0–0.2)

## 2023-05-28 LAB — BASIC METABOLIC PANEL
Anion gap: 10 (ref 5–15)
BUN: 25 mg/dL — ABNORMAL HIGH (ref 8–23)
CO2: 22 mmol/L (ref 22–32)
Calcium: 7.5 mg/dL — ABNORMAL LOW (ref 8.9–10.3)
Chloride: 118 mmol/L — ABNORMAL HIGH (ref 98–111)
Creatinine, Ser: 1.86 mg/dL — ABNORMAL HIGH (ref 0.61–1.24)
GFR, Estimated: 36 mL/min — ABNORMAL LOW (ref >60)
Glucose, Bld: 118 mg/dL — ABNORMAL HIGH (ref 70–99)
Potassium: 3 mmol/L — ABNORMAL LOW (ref 3.5–5.1)
Sodium: 150 mmol/L — ABNORMAL HIGH (ref 135–145)

## 2023-05-28 LAB — GLUCOSE, CAPILLARY
Glucose-Capillary: 103 mg/dL — ABNORMAL HIGH (ref 70–99)
Glucose-Capillary: 113 mg/dL — ABNORMAL HIGH (ref 70–99)

## 2023-05-28 MED ORDER — POTASSIUM CHLORIDE 10 MEQ/100ML IV SOLN
10.0000 meq | INTRAVENOUS | Status: AC
Start: 2023-05-28 — End: 2023-05-28
  Administered 2023-05-28 (×3): 10 meq via INTRAVENOUS
  Filled 2023-05-28 (×3): qty 100

## 2023-05-28 MED ORDER — DEXTROSE 5 % IV SOLN: INTRAVENOUS | Status: AC

## 2023-05-28 MED ORDER — POTASSIUM CHLORIDE 10 MEQ/100ML IV SOLN
10.0000 meq | INTRAVENOUS | Status: DC
  Administered 2023-05-28 (×2): 10 meq via INTRAVENOUS
  Filled 2023-05-28 (×2): qty 100

## 2023-05-28 NOTE — Progress Notes (Addendum)
Progress Note   Patient: Justin Ali ZOX:[id number] DOB: [birth year] DOA: 05/19/2023     9 DOS: the patient was seen and examined on 05/28/2023    Brief hospital course: Justin Ali is 83 y.o. male with prostate cancer, hypertension, sinus bradycardia, dementia, who presented after syncopal episode in his independent living facility.  Found at the dining table slumped over.  Blood sugar on arrival 156, blood pressure 90/50, satting 80% on room air.  Denied any recent fever or chills.  ER workup reveals WBC 5.2, hemoglobin 9.3, platelets 149, COVID/flu/RSV negative.  Creatinine 2.65.  Chest x-ray stable apart from known metastatic prostate cancer.     Assessment and Plan:   Metabolic Encephalopathy - multifactorial, electrolyte disturbance + history of dementia+ hospital delirium - Head CT 1/25 negative for acute intracranial abnormalities - Continues to improve, not quite at baseline. Persistent drowsiness today and still inability to eat independently.  Will proceed with brain MRI, possible metastatic spread to the brain versus small/evolving CVA not seen on CT - MRI of the brain did not show any acute or subacute infarct Continue to monitor neurochecks - Infectious workup has thus far been negative - Dopplers negative for DVT I discussed with speech therapist today   Syncope, multifactorial - Orthostatic Hypotension: BP 154/70, pulse 50 bpm.  Standing 115/75 pulse 97 bpm. - Dehydrated on arrival.  Echo reveals EF 55 to 60%, no significant diastolic dysfunction, patient is taking Lasix daily at home (for chronic pedal edema).  Have recommended we switch to PRN dosing - Patient also chronically bradycardic which may be playing a role - Carotid Dopplers: Mild amount of plaque in bilateral carotid arteries.  Bilateral ICA stenosis less than 50%.    Right parotitis Swelling and warmth in right parotid gland region Patient started on zosyn  Bradycardia - Resolved now - Appears  chronic on review of prior EKGs - Echo mostly unremarkable   Hypercalcemia, resolved now --  status post zoledronic acid, IV fluids - Likely secondary to malignancy as patient has diffuse osteolytic lesions (redemonstrated on CXR and brain CT) - Appears he has been taking calcium supplements outpatient - stopped - Alk phos within normal limits -- PTH low 8 -- MM panel pending - Nephrology have signed off - Oncology is consulted   Hypokalemia Hyperchloremia Hypernatremia Continue D5W Continue potassium repletion and monitoring   Dementia - Son reports at baseline patient has short-term memory deficits but is able to complete his ADLs, lives in an independent living facility - Driving status previously revoked - PT OT on board - Head CT within normal limits on arrival, repeat 1/25 WNL - Continue nightly Seroquel Continue delirium precaution   AKI superimposed on chronic kidney disease stage IIIa - Baseline creatinine 1-1.4 with GFR in 40s - Creatinine on arrival 2.65, improving now - Patient was clinically dry on arrival, MIVF as above - Hold nephrotoxic agents - Renally dose when needed   Metastatic prostate cancer - Metastatic 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  Given worsening electrolytes we will hold Zytiga for now. - Appears patient was previously on hospice with a joint decision of his daughter. His son, [name] has recently revoked hospice and is now requesting full aggressive care. - Have engaged oncology during this admission give worsening hypercalcemia -- PSA 1.56 -Have discussed benefits of palliative care  +/- hospice again with  the patient's family, they are aware and considering all options - Have also discussed recommendations to proceed with the FDA approved medications only and to avoid additional supplements/meds  including ivermectin and fenbendazole to avoid complicating his current clinical picture   DVT prophylaxis: lovenox CODE STATUS: DNR/DNI Family Communication: Discussed with caregiver at bedside, and son [name] on the phone. Disposition:  Status is: Inpatient, TOC consult for SNF.  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 not yet medically cleared     Subjective:  Patient seen and examined at bedside this morning I discussed with speech therapist today We will continue trial of feeding as he has no difficulty swallowing but mostly related to cognitive decline Denies nausea vomiting abdominal pain chest pain   Physical Exam:   General exam: Appears calm and comfortable, NAD  Respiratory system: No work of breathing, symmetric chest wall expansion Cardiovascular system: S1 & S2 heard, RRR.  Gastrointestinal system: Abdomen is nondistended, soft and nontender.  Neuro: Awake, alert, follows some commands, reports  "I am where I am" when asked about location Extremities: Symmetric, expected ROM Skin: No rashes, lesions Psychiatry: Calm, otherwise difficult to assess    Data reviewed:    Latest Ref Rng & Units 05/28/2023    5:00 AM 05/27/2023    8:32 PM 05/27/2023    4:22 AM  BMP  Glucose 70 - 99 mg/dL 161   096   BUN 8 - 23 mg/dL 25   32   Creatinine 0.45 - 1.24 mg/dL 4.09   8.11   Sodium 914 - 145 mmol/L 150   155   Potassium 3.5 - 5.1 mmol/L 3.0  3.1  2.6   Chloride 98 - 111 mmol/L 118   118   CO2 22 - 32 mmol/L 22   22   Calcium 8.9 - 10.3 mg/dL 7.5   8.1        Latest Ref Rng & Units 05/28/2023    5:00 AM 05/27/2023    4:22 AM 05/26/2023    4:23 AM  CBC  WBC 4.0 - 10.5 K/uL 10.0  8.3  6.0   Hemoglobin 13.0 - 17.0 g/dL 78.2  95.6  21.3   Hematocrit 39.0 - 52.0 % 32.2  34.3  33.0   Platelets 150 - 400 K/uL 200  194  171     Vitals:   05/28/23 0416 05/28/23 0446 05/28/23 0833 05/28/23 1759  BP: (!) 111/57 122/63 135/85 123/72  Pulse: 97  95 91 88  Resp: 18 18 17 18   Temp: 98.7 F (37.1 C) 99.5 F (37.5 C) 98.4 F (36.9 C) 98.2 F (36.8 C)  TempSrc:      SpO2: 96% 96% 95% 97%  Weight:      Height:         Author: Loyce Dys, MD 05/28/2023 6:48 PM  For on call review [email or web address].

## 2023-05-28 NOTE — TOC Progression Note (Signed)
Transition of Care [facility]) - Progression Note    Patient Details  Name: Justin Ali MRN: [id number] Date of Birth: May 07, 1940  [facility]) CM/SW Contact  Allena Katz, LCSW Phone Number: 05/28/2023, 9:49 AM  Clinical Narrative:    CSW shared bed offers with son who states he would like to go with [facility]. Csw has reached out to see if they can also accommodate for LTC.   Expected Discharge Plan: Home w Home Health Services Barriers to Discharge: Continued Medical Work up  Expected Discharge Plan and Services                                               Social Determinants of Health (SDOH) Interventions SDOH Screenings   Food Insecurity: No Food Insecurity (05/19/2023)  Housing: Low Risk  (05/19/2023)  Transportation Needs: No Transportation Needs (05/19/2023)  Utilities: Not At Risk (05/19/2023)  Alcohol Screen: Low Risk  (07/16/2022)  Depression (PHQ2-9): Low Risk  (03/13/2023)  Recent Concern: Depression (PHQ2-9) - High Risk (01/16/2023)  Financial Resource Strain: Low Risk  (07/16/2022)  Physical Activity: Insufficiently Active (07/16/2022)  Social Connections: Socially Isolated (05/19/2023)  Stress: Stress Concern Present (07/16/2022)  Tobacco Use: Medium Risk (05/19/2023)    Readmission Risk Interventions     No data to display

## 2023-05-28 NOTE — Progress Notes (Signed)
Patient demonstrates agitation throughout day; increased BUE swinging and moaning with all patient care.  Physical behavior decreased with IV morphine, however did continue.

## 2023-05-28 NOTE — Plan of Care (Signed)
  Problem: Education: Goal: Knowledge of condition and prescribed therapy will improve Outcome: Progressing   Problem: Cardiac: Goal: Will achieve and/or maintain adequate cardiac output Outcome: Progressing   Problem: Health Behavior/Discharge Planning: Goal: Ability to manage health-related needs will improve Outcome: Progressing   Problem: Clinical Measurements: Goal: Ability to maintain clinical measurements within normal limits will improve Outcome: Progressing   Problem: Activity: Goal: Risk for activity intolerance will decrease Outcome: Progressing   Problem: Nutrition: Goal: Adequate nutrition will be maintained Outcome: Progressing   Problem: Pain Managment: Goal: General experience of comfort will improve and/or be controlled Outcome: Progressing   Problem: Safety: Goal: Ability to remain free from injury will improve Outcome: Progressing   Problem: Skin Integrity: Goal: Risk for impaired skin integrity will decrease Outcome: Progressing

## 2023-05-28 NOTE — Progress Notes (Signed)
Red mass present distal to patient's right ear with increased warmth and tender to touch.  Dr Meriam Sprague notified.

## 2023-05-28 NOTE — Plan of Care (Signed)
  Problem: Education: Goal: Knowledge of condition and prescribed therapy will improve Outcome: Progressing   Problem: Cardiac: Goal: Will achieve and/or maintain adequate cardiac output Outcome: Progressing   Problem: Education: Goal: Knowledge of General Education information will improve Description: Including pain rating scale, medication(s)/side effects and non-pharmacologic comfort measures Outcome: Progressing   Problem: Activity: Goal: Risk for activity intolerance will decrease Outcome: Progressing   Problem: Nutrition: Goal: Adequate nutrition will be maintained Outcome: Progressing   Problem: Coping: Goal: Level of anxiety will decrease Outcome: Progressing   Problem: Pain Managment: Goal: General experience of comfort will improve and/or be controlled Outcome: Progressing   Problem: Safety: Goal: Ability to remain free from injury will improve Outcome: Progressing   Problem: Skin Integrity: Goal: Risk for impaired skin integrity will decrease Outcome: Progressing

## 2023-05-28 NOTE — Progress Notes (Signed)
OT Cancellation Note  Patient Details Name: Justin Ali MRN: [id number] DOB: 09/26/1940   Cancelled Treatment:    Reason Eval/Treat Not Completed: Other (comment). Upon attempt with PT/OT, pt able to share his name with VC but unable to follow simple commands. Resisted all movements for EOB and repositioning for comfort. Will re-attempt as able.   Arman Filter., MPH, MS, OTR/L ascom [phone] 05/28/23, 1:41 PM

## 2023-05-28 NOTE — Progress Notes (Signed)
SLP Cancellation Note  Patient Details Name: Justin Ali MRN: [id number] DOB: 02-15-1941   Cancelled treatment:       Reason Eval/Treat Not Completed: Patient declined, no reason specified. Chart review completed. Pt with continued AMS, though improving per MD progress note. MRI of the brain did not show any acute or subacute infarct or parenchymal metastatic disease. BSE attempted this date; however, pt refusing all attempts at oral care (removing swab from therapist hand and turning head away) and PO trials (turning head away from cup/straw and taking hand from therapist when attempting to complete hand over hand for holding cup). Education provided to pt's aid in the room regarding aspiration precautions (specifically positioning upright in bed, only attempting to feed when pt is alert, small bites/sips) and cognitive factors impacting PO intake and refusal. Aid reported understanding. RN and MD are aware of limitations for assessment in the setting of cognitive factors impacting intake.   Of note, BSE completed on 1/25 with education provided to son/daughter in law regarding cognitive impact on feeding and aspiration precautions.  Based on performance this date and completion of education, no further acute services indicated at this time.   [place] Gila Lauf Clapp  MS [facility] SLP   [place] J Clapp 05/28/2023, 1:08 PM

## 2023-05-28 NOTE — Progress Notes (Signed)
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 Sitter present in room. Patient is alert. Able to state his name with garbled speech. Does not follow direction for mobility and actively resists attempting to guide his legs to the edge of bed. Patient seems to have pain with any movement in bed as he is moaning and resisting movement. Patient will continue to benefit from skilled PT if he is able to participate. Will continue to follow acutely.       If plan is discharge home, recommend the following: Two people to help with walking and/or transfers;Two people to help with bathing/dressing/bathroom;Assistance with feeding;Direct supervision/assist for financial management;Direct supervision/assist for medications management   Can travel by private vehicle     No  Equipment Recommendations  None recommended by PT    Recommendations for Other Services       Precautions / Restrictions Precautions Precautions: Fall Restrictions Weight Bearing Restrictions Per Provider Order: No     Mobility  Bed Mobility Overal bed mobility: (P) Needs Assistance       Supine to sit: HOB elevated, Used rails, Total assist     General bed mobility comments: Patient resisting all attempts at moving to edge of bed. Does not participate in any meaningful way with therapy.    Transfers                   General transfer comment: unable to attempt    Ambulation/Gait                   Stairs             Wheelchair Mobility     Tilt Bed    Modified Rankin (Stroke Patients Only)       Balance                                             Cognition Arousal: Alert Behavior During Therapy: Flat affect Overall Cognitive Status: Impaired/Different from baseline Area of Impairment: Following commands, Safety/judgement, Awareness, Problem solving                 Orientation Level: Disoriented to, Place, Time, Situation   Memory: Decreased short-term memory Following Commands: Follows one step commands inconsistently (does not follow commands) Safety/Judgement: Decreased awareness of deficits, Decreased awareness of safety Awareness: Intellectual Problem Solving: Decreased initiation, Slow processing, Requires verbal cues, Requires tactile cues, Difficulty sequencing          Exercises      General Comments        Pertinent Vitals/Pain Pain Assessment Pain Assessment: PAINAD Breathing: normal Negative Vocalization: occasional moan/groan, low speech, negative/disapproving quality Facial Expression: facial grimacing Body Language: rigid, fists clenched, knees up, pushing/pulling away, strikes out Consolability: distracted or reassured by voice/touch PAINAD Score: 6 Pain Location: Does not localize, becomes tense with any movement in bed. Pain Descriptors / Indicators: Grimacing, Guarding, Moaning Pain Intervention(s): Monitored during session, Relaxation    Home Living  Prior Function            PT Goals (current goals can now be found in the care plan section) Acute Rehab PT Goals Patient Stated Goal: unable to verbalize PT Goal Formulation: Patient unable to participate in goal setting Time For Goal Achievement: 05/29/23 Potential to Achieve Goals: Poor Progress towards PT goals: Not progressing toward goals - comment    Frequency    Min 1X/week      PT Plan      Co-evaluation PT/OT/SLP Co-Evaluation/Treatment: Yes Reason for Co-Treatment: To address functional/ADL transfers;Necessary to address cognition/behavior during functional  activity;For patient/therapist safety PT goals addressed during session: Mobility/safety with mobility        AM-PAC PT "6 Clicks" Mobility   Outcome Measure  Help needed turning from your back to your side while in a flat bed without using bedrails?: Total Help needed moving from lying on your back to sitting on the side of a flat bed without using bedrails?: Total Help needed moving to and from a bed to a chair (including a wheelchair)?: Total Help needed standing up from a chair using your arms (e.g., wheelchair or bedside chair)?: Total Help needed to walk in hospital room?: Total Help needed climbing 3-5 steps with a railing? : Total 6 Click Score: 6    End of Session   Activity Tolerance: Other (comment) (limited by cognition) Patient left: in bed;with call bell/phone within reach;with nursing/sitter in room;with bed alarm set Nurse Communication: Mobility status PT Visit Diagnosis: Other abnormalities of gait and mobility (R26.89)     Time: 1610-9604 PT Time Calculation (min) (ACUTE ONLY): 8 min  Charges:    $Therapeutic Activity: 8-22 mins PT General Charges $$ ACUTE PT VISIT: 1 Visit                     Makeshia Seat, PT, GCS 05/28/23,2:18 PM

## 2023-05-29 DIAGNOSIS — N179 Acute kidney failure, unspecified: Secondary | ICD-10-CM | POA: Diagnosis not present

## 2023-05-29 DIAGNOSIS — R52 Pain, unspecified: Secondary | ICD-10-CM | POA: Diagnosis not present

## 2023-05-29 DIAGNOSIS — R55 Syncope and collapse: Secondary | ICD-10-CM | POA: Diagnosis not present

## 2023-05-29 LAB — BASIC METABOLIC PANEL
Anion gap: 8 (ref 5–15)
BUN: 24 mg/dL — ABNORMAL HIGH (ref 8–23)
CO2: 22 mmol/L (ref 22–32)
Calcium: 7 mg/dL — ABNORMAL LOW (ref 8.9–10.3)
Chloride: 116 mmol/L — ABNORMAL HIGH (ref 98–111)
Creatinine, Ser: 2.03 mg/dL — ABNORMAL HIGH (ref 0.61–1.24)
GFR, Estimated: 32 mL/min — ABNORMAL LOW (ref >60)
Glucose, Bld: 109 mg/dL — ABNORMAL HIGH (ref 70–99)
Potassium: 3.8 mmol/L (ref 3.5–5.1)
Sodium: 146 mmol/L — ABNORMAL HIGH (ref 135–145)

## 2023-05-29 LAB — CBC WITH DIFFERENTIAL/PLATELET
Abs Immature Granulocytes: 0.18 10*3/uL — ABNORMAL HIGH (ref 0.00–0.07)
Basophils Absolute: 0 10*3/uL (ref 0.0–0.1)
Basophils Relative: 0 %
Eosinophils Absolute: 0.1 10*3/uL (ref 0.0–0.5)
Eosinophils Relative: 1 %
HCT: 28.6 % — ABNORMAL LOW (ref 39.0–52.0)
Hemoglobin: 9.6 g/dL — ABNORMAL LOW (ref 13.0–17.0)
Immature Granulocytes: 2 %
Lymphocytes Relative: 15 %
Lymphs Abs: 1.5 10*3/uL (ref 0.7–4.0)
MCH: 30.6 pg (ref 26.0–34.0)
MCHC: 33.6 g/dL (ref 30.0–36.0)
MCV: 91.1 fL (ref 80.0–100.0)
Monocytes Absolute: 0.7 10*3/uL (ref 0.1–1.0)
Monocytes Relative: 7 %
Neutro Abs: 7.2 10*3/uL (ref 1.7–7.7)
Neutrophils Relative %: 75 %
Platelets: 195 10*3/uL (ref 150–400)
RBC: 3.14 MIL/uL — ABNORMAL LOW (ref 4.22–5.81)
RDW: 13 % (ref 11.5–15.5)
WBC: 9.7 10*3/uL (ref 4.0–10.5)
nRBC: 0 % (ref 0.0–0.2)

## 2023-05-29 LAB — GLUCOSE, CAPILLARY: Glucose-Capillary: 107 mg/dL — ABNORMAL HIGH (ref 70–99)

## 2023-05-29 MED ORDER — PIPERACILLIN-TAZOBACTAM 3.375 G IVPB
3.3750 g | Freq: Three times a day (TID) | INTRAVENOUS | Status: DC
  Administered 2023-05-29 – 2023-06-03 (×15): 3.375 g via INTRAVENOUS
  Filled 2023-05-29 (×15): qty 50

## 2023-05-29 NOTE — Plan of Care (Signed)
  Problem: Education: Goal: Knowledge of condition and prescribed therapy will improve Outcome: Progressing   Problem: Education: Goal: Knowledge of General Education information will improve Description: Including pain rating scale, medication(s)/side effects and non-pharmacologic comfort measures Outcome: Progressing   Problem: Activity: Goal: Risk for activity intolerance will decrease Outcome: Progressing   Problem: Pain Managment: Goal: General experience of comfort will improve and/or be controlled Outcome: Progressing   Problem: Safety: Goal: Ability to remain free from injury will improve Outcome: Progressing   Problem: Skin Integrity: Goal: Risk for impaired skin integrity will decrease Outcome: Progressing

## 2023-05-29 NOTE — Progress Notes (Signed)
Progress Note   Patient: Justin Ali [id number]:[id number] DOB: 1941/01/03 DOA: 05/19/2023     10 DOS: the patient was seen and examined on 05/29/2023     Brief hospital course: Justin Ali is 83 y.o. male with prostate cancer, hypertension, sinus bradycardia, dementia, who presented after syncopal episode in his independent living facility.  Found at the dining table slumped over.  Blood sugar on arrival 156, blood pressure 90/50, satting 80% on room air.  Denied any recent fever or chills.  ER workup reveals WBC 5.2, hemoglobin 9.3, platelets 149, COVID/flu/RSV negative.  Creatinine 2.65.  Chest x-ray stable apart from known metastatic prostate cancer.     Assessment and Plan:   Metabolic Encephalopathy - multifactorial, electrolyte disturbance + history of dementia+ hospital delirium - Head CT 1/25 negative for acute intracranial abnormalities - Continues to improve, not quite at baseline. Persistent drowsiness today and still inability to eat independently.  Will proceed with brain MRI, possible metastatic spread to the brain versus small/evolving CVA not seen on CT - MRI of the brain did not show any acute or subacute infarct Continue to monitor neurochecks - Infectious workup has thus far been negative - Dopplers negative for DVT I discussed with speech therapist today   Syncope, multifactorial - Orthostatic Hypotension: BP 154/70, pulse 50 bpm.  Standing 115/75 pulse 97 bpm. - Dehydrated on arrival.  Echo reveals EF 55 to 60%, no significant diastolic dysfunction, patient is taking Lasix daily at home (for chronic pedal edema).  Have recommended we switch to PRN dosing - Patient also chronically bradycardic which may be playing a role - Carotid Dopplers: Mild amount of plaque in bilateral carotid arteries.  Bilateral ICA stenosis less than 50%.     Right parotitis Swelling and warmth in right parotid gland region Continue Zosyn   Bradycardia - Resolved now - Appears  chronic on review of prior EKGs - Echo mostly unremarkable   Hypercalcemia, resolved now --  status post zoledronic acid, IV fluids - Likely secondary to malignancy as patient has diffuse osteolytic lesions (redemonstrated on CXR and brain CT) - Appears he has been taking calcium supplements outpatient - stopped - Alk phos within normal limits -- PTH low 8 --Kappa lambda free light chain 26.6 which is slightly high - Nephrology have signed off Oncology following   Hypokalemia Hyperchloremia Hypernatremia Continue D5W Continue potassium repletion and monitoring   Dementia - Son reports at baseline patient has short-term memory deficits but is able to complete his ADLs, lives in an independent living facility - Driving status previously revoked - PT OT on board - Head CT within normal limits on arrival, repeat 1/25 WNL - Continue nightly Seroquel Continue delirium precaution   AKI superimposed on chronic kidney disease stage IIIa - Baseline creatinine 1-1.4 with GFR in 40s - Creatinine on arrival 2.65, improving now - Patient was clinically dry on arrival, MIVF as above - Hold nephrotoxic agents - Renally dose when needed   Metastatic prostate cancer - Metastatic 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  Given worsening electrolytes we will hold Zytiga for now. - Appears patient was previously on hospice with a joint decision of his daughter. His son, [name] has recently revoked hospice and is now requesting full aggressive care. - Have engaged oncology during this admission give worsening hypercalcemia -- PSA 1.56 -Have discussed benefits of palliative care  +/-  hospice again with the patient's family, they are aware and considering all options - Have also discussed recommendations to proceed with the FDA approved medications only and to avoid  additional supplements/meds including ivermectin and fenbendazole to avoid complicating his current clinical picture   DVT prophylaxis: lovenox CODE STATUS: DNR/DNI Family Communication: Discussed with caregiver at bedside, and son [name] on the phone. Disposition:  Status is: Inpatient, TOC consult for SNF.  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 not yet medically cleared     Subjective:  Patient seen and examined at bedside this morning Has pain around the parotid area on the right Denies nausea or vomiting Continues to have a sitter in the room   Physical Exam:   General exam: Appears calm and comfortable, NAD  Respiratory system: No work of breathing, symmetric chest wall expansion Cardiovascular system: S1 & S2 heard, RRR.  Gastrointestinal system: Abdomen is nondistended, soft and nontender.  Neuro: Awake, alert, follows some commands, reports  "I am where I am" when asked about location Extremities: Symmetric, expected ROM Skin: No rashes, lesions Psychiatry: Calm, otherwise difficult to assess     Data reviewed:    Latest Ref Rng & Units 05/29/2023    4:26 AM 05/28/2023    5:00 AM 05/27/2023    8:32 PM  BMP  Glucose 70 - 99 mg/dL 161  096    BUN 8 - 23 mg/dL 24  25    Creatinine 0.45 - 1.24 mg/dL 4.09  8.11    Sodium 914 - 145 mmol/L 146  150    Potassium 3.5 - 5.1 mmol/L 3.8  3.0  3.1   Chloride 98 - 111 mmol/L 116  118    CO2 22 - 32 mmol/L 22  22    Calcium 8.9 - 10.3 mg/dL 7.0  7.5      Vitals:   05/29/23 0018 05/29/23 0359 05/29/23 0500 05/29/23 0829  BP: 128/63 (!) 108/52  130/71  Pulse: 99 88  96  Resp: 17 18  18   Temp: 97.8 F (36.6 C) 97.8 F (36.6 C)  97.6 F (36.4 C)  TempSrc:      SpO2: 97% 96%  98%  Weight: 71 kg  71.2 kg   Height:          Latest Ref Rng & Units 05/29/2023    4:26 AM 05/28/2023    5:00 AM 05/27/2023    4:22 AM  CBC  WBC 4.0 - 10.5 K/uL 9.7  10.0  8.3   Hemoglobin 13.0 - 17.0 g/dL 9.6   78.2  95.6   Hematocrit 39.0 - 52.0 % 28.6  32.2  34.3   Platelets 150 - 400 K/uL 195  200  194      Author: Loyce Dys, MD 05/29/2023 2:50 PM  For on call review [email or web address].

## 2023-05-29 NOTE — Plan of Care (Signed)
  Problem: Education: Goal: Knowledge of condition and prescribed therapy will improve Outcome: Not Progressing   Problem: Cardiac: Goal: Will achieve and/or maintain adequate cardiac output Outcome: Not Progressing   Problem: Physical Regulation: Goal: Complications related to the disease process, condition or treatment will be avoided or minimized Outcome: Not Progressing   Problem: Education: Goal: Knowledge of General Education information will improve Description: Including pain rating scale, medication(s)/side effects and non-pharmacologic comfort measures Outcome: Not Progressing   Problem: Health Behavior/Discharge Planning: Goal: Ability to manage health-related needs will improve Outcome: Not Progressing   Problem: Clinical Measurements: Goal: Ability to maintain clinical measurements within normal limits will improve Outcome: Not Progressing Goal: Will remain free from infection Outcome: Not Progressing Goal: Diagnostic test results will improve Outcome: Not Progressing Goal: Respiratory complications will improve Outcome: Not Progressing Goal: Cardiovascular complication will be avoided Outcome: Not Progressing   Problem: Activity: Goal: Risk for activity intolerance will decrease Outcome: Not Progressing   Problem: Nutrition: Goal: Adequate nutrition will be maintained Outcome: Not Progressing   Problem: Coping: Goal: Level of anxiety will decrease Outcome: Not Progressing   Problem: Elimination: Goal: Will not experience complications related to bowel motility Outcome: Not Progressing Goal: Will not experience complications related to urinary retention Outcome: Not Progressing   Problem: Pain Managment: Goal: General experience of comfort will improve and/or be controlled Outcome: Not Progressing   Problem: Safety: Goal: Ability to remain free from injury will improve Outcome: Not Progressing   Problem: Skin Integrity: Goal: Risk for impaired  skin integrity will decrease Outcome: Not Progressing

## 2023-05-30 DIAGNOSIS — N179 Acute kidney failure, unspecified: Secondary | ICD-10-CM | POA: Diagnosis not present

## 2023-05-30 DIAGNOSIS — R55 Syncope and collapse: Secondary | ICD-10-CM | POA: Diagnosis not present

## 2023-05-30 LAB — CBC WITH DIFFERENTIAL/PLATELET
Abs Immature Granulocytes: 0.1 10*3/uL — ABNORMAL HIGH (ref 0.00–0.07)
Basophils Absolute: 0 10*3/uL (ref 0.0–0.1)
Basophils Relative: 0 %
Eosinophils Absolute: 0.2 10*3/uL (ref 0.0–0.5)
Eosinophils Relative: 2 %
HCT: 29.5 % — ABNORMAL LOW (ref 39.0–52.0)
Hemoglobin: 10.2 g/dL — ABNORMAL LOW (ref 13.0–17.0)
Immature Granulocytes: 1 %
Lymphocytes Relative: 13 %
Lymphs Abs: 1.2 10*3/uL (ref 0.7–4.0)
MCH: 31 pg (ref 26.0–34.0)
MCHC: 34.6 g/dL (ref 30.0–36.0)
MCV: 89.7 fL (ref 80.0–100.0)
Monocytes Absolute: 0.6 10*3/uL (ref 0.1–1.0)
Monocytes Relative: 7 %
Neutro Abs: 6.7 10*3/uL (ref 1.7–7.7)
Neutrophils Relative %: 77 %
Platelets: 261 10*3/uL (ref 150–400)
RBC: 3.29 MIL/uL — ABNORMAL LOW (ref 4.22–5.81)
RDW: 13 % (ref 11.5–15.5)
WBC: 8.7 10*3/uL (ref 4.0–10.5)
nRBC: 0 % (ref 0.0–0.2)

## 2023-05-30 LAB — BASIC METABOLIC PANEL
Anion gap: 13 (ref 5–15)
BUN: 35 mg/dL — ABNORMAL HIGH (ref 8–23)
CO2: 20 mmol/L — ABNORMAL LOW (ref 22–32)
Calcium: 6.8 mg/dL — ABNORMAL LOW (ref 8.9–10.3)
Chloride: 114 mmol/L — ABNORMAL HIGH (ref 98–111)
Creatinine, Ser: 2.1 mg/dL — ABNORMAL HIGH (ref 0.61–1.24)
GFR, Estimated: 31 mL/min — ABNORMAL LOW (ref >60)
Glucose, Bld: 105 mg/dL — ABNORMAL HIGH (ref 70–99)
Potassium: 2.9 mmol/L — ABNORMAL LOW (ref 3.5–5.1)
Sodium: 147 mmol/L — ABNORMAL HIGH (ref 135–145)

## 2023-05-30 LAB — GLUCOSE, CAPILLARY: Glucose-Capillary: 101 mg/dL — ABNORMAL HIGH (ref 70–99)

## 2023-05-30 MED ORDER — POTASSIUM CHLORIDE 20 MEQ PO PACK: 40.0000 meq | PACK | ORAL | Status: DC

## 2023-05-30 MED ORDER — POTASSIUM CHLORIDE 20 MEQ PO PACK
40.0000 meq | PACK | ORAL | Status: AC
  Administered 2023-05-30 (×2): 40 meq via ORAL
  Filled 2023-05-30 (×2): qty 2

## 2023-05-30 NOTE — Progress Notes (Signed)
Progress Note   Patient: Justin Ali [id number]:[id number] DOB: [birth year] DOA: 05/19/2023     11 DOS: the patient was seen and examined on 05/30/2023      Brief hospital course: Justin Ali is 83 y.o. male with prostate cancer, hypertension, sinus bradycardia, dementia, who presented after syncopal episode in his independent living facility.  Found at the dining table slumped over.  Blood sugar on arrival 156, blood pressure 90/50, satting 80% on room air.  Denied any recent fever or chills.  ER workup reveals WBC 5.2, hemoglobin 9.3, platelets 149, COVID/flu/RSV negative.  Creatinine 2.65.  Chest x-ray stable apart from known metastatic prostate cancer.     Assessment and Plan:   Metabolic Encephalopathy - multifactorial, electrolyte disturbance + history of dementia+ hospital delirium - Head CT 1/25 negative for acute intracranial abnormalities - Continues to improve, not quite at baseline. Persistent drowsiness today and still inability to eat independently.  Will proceed with brain MRI, possible metastatic spread to the brain versus small/evolving CVA not seen on CT - MRI of the brain did not show any acute or subacute infarct Continue to monitor neurochecks - Infectious workup has thus far been negative - Dopplers negative for DVT I discussed with speech therapist today   Syncope, multifactorial - Orthostatic Hypotension: BP 154/70, pulse 50 bpm.  Standing 115/75 pulse 97 bpm. - Dehydrated on arrival.  Echo reveals EF 55 to 60%, no significant diastolic dysfunction, patient is taking Lasix daily at home (for chronic pedal edema).  Have recommended we switch to PRN dosing - Patient also chronically bradycardic which may be playing a role - Carotid Dopplers: Mild amount of plaque in bilateral carotid arteries.  Bilateral ICA stenosis less than 50%.     Right parotitis Swelling and warmth in right parotid gland region Continue Zosyn   Bradycardia - Resolved now - Appears  chronic on review of prior EKGs - Echo mostly unremarkable   Hypercalcemia, resolved now --  status post zoledronic acid, IV fluids - Likely secondary to malignancy as patient has diffuse osteolytic lesions (redemonstrated on CXR and brain CT) - Appears he has been taking calcium supplements outpatient - stopped - Alk phos within normal limits -- PTH low 8 --Kappa lambda free light chain 26.6 which is slightly high - Nephrology have signed off Oncology following   Hypokalemia Hyperchloremia Hypernatremia Continue D5W Continue potassium repletion and monitoring   Dementia - Son reports at baseline patient has short-term memory deficits but is able to complete his ADLs, lives in an independent living facility - Driving status previously revoked - PT OT on board - Head CT within normal limits on arrival, repeat 1/25 WNL - Continue nightly Seroquel Continue delirium precaution   AKI superimposed on chronic kidney disease stage IIIa - Baseline creatinine 1-1.4 with GFR in 40s - Creatinine on arrival 2.65, improving now - Patient was clinically dry on arrival, MIVF as above - Hold nephrotoxic agents - Renally dose when needed   Metastatic prostate cancer - Metastatic 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  Given worsening electrolytes we will hold Zytiga for now. - Appears patient was previously on hospice with a joint decision of his daughter. His son, Justin Ali has recently revoked hospice and is now requesting full aggressive care. - Have engaged oncology during this admission give worsening hypercalcemia -- PSA 1.56 -Have discussed benefits of palliative  care  +/- hospice again with the patient's family, they are aware and considering all options - Have also discussed recommendations to proceed with the FDA approved medications only and to avoid  additional supplements/meds including ivermectin and fenbendazole to avoid complicating his current clinical picture   DVT prophylaxis: lovenox CODE STATUS: DNR/DNI Family Communication: Discussed with caregiver at bedside  Disposition:  Status is: Inpatient, TOC consult for SNF.  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 not yet medically cleared     Subjective:  Swelling around the right parotid area is less tender Denies nausea vomiting Mental status improved today   Physical Exam:   General exam: Appears calm and comfortable, NAD  Respiratory system: No work of breathing, symmetric chest wall expansion Cardiovascular system: S1 & S2 Ali, RRR.  Gastrointestinal system: Abdomen is nondistended, soft and nontender.  Neuro: Awake, alert, follows some commands, reports  "I am where I am" when asked about location Extremities: Symmetric, expected ROM Skin: No rashes, lesions Psychiatry: Calm, otherwise difficult to assess     Data reviewed:    Latest Ref Rng & Units 05/30/2023    5:26 AM 05/29/2023    4:26 AM 05/28/2023    5:00 AM  BMP  Glucose 70 - 99 mg/dL 884  166  063   BUN 8 - 23 mg/dL 35  24  25   Creatinine 0.61 - 1.24 mg/dL 0.16  0.10  9.32   Sodium 135 - 145 mmol/L 147  146  150   Potassium 3.5 - 5.1 mmol/L 2.9  3.8  3.0   Chloride 98 - 111 mmol/L 114  116  118   CO2 22 - 32 mmol/L 20  22  22    Calcium 8.9 - 10.3 mg/dL 6.8  7.0  7.5     Vitals:   05/30/23 0459 05/30/23 0500 05/30/23 0750 05/30/23 1606  BP: (!) 94/58  (!) 141/74 (!) 119/59  Pulse: 84  97 (!) 101  Resp: 16  20 20   Temp: 98.6 F (37 C)  99.5 F (37.5 C) 99.5 F (37.5 C)  TempSrc:   Oral Oral  SpO2: 97%  97% 99%  Weight:  70.5 kg    Height:          Latest Ref Rng & Units 05/30/2023    5:26 AM 05/29/2023    4:26 AM 05/28/2023    5:00 AM  CBC  WBC 4.0 - 10.5 K/uL 8.7  9.7  10.0   Hemoglobin 13.0 - 17.0 g/dL 35.5  9.6  73.2   Hematocrit 39.0 - 52.0 % 29.5   28.6  32.2   Platelets 150 - 400 K/uL 261  195  200      Author: Loyce Dys, MD 05/30/2023 5:16 PM  For on call review [email or web address].

## 2023-05-30 NOTE — Progress Notes (Signed)
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 Noted low K+ however cleared to participate.  Supportive family members present at bedside. Pt is verbal but needs encouragement throughout session to perform desired task. Pt is overall a little self limiting but is able to participate with increased time for all task. PT was able to roll L/R to allow hygiene care to be performed after BM. After peri-care, pt progressed to sitting EOB. Was able to maintain balance at EOB with all extremity support with supervision only. Attempted to stand 1 x however pt endorses R hamstring pain and sits back back. Unwilling to attempt again. Total assist to return to bed and reposition to [facility]. DEC recs remain appropriate. Acute PT will continue to follow per current POC.    If plan is discharge home, recommend the following: Two people to help with walking and/or transfers;Two people to help with bathing/dressing/bathroom;Assistance with feeding;Direct supervision/assist for financial management;Direct supervision/assist for medications management     Equipment Recommendations  None recommended by PT       Precautions / Restrictions Precautions Precautions: Fall Precaution Comments: watch BP and oxygen Restrictions Weight Bearing Restrictions Per Provider Order: No     Mobility  Bed Mobility Overal bed mobility: Needs Assistance Bed Mobility: Rolling Rolling: Min assist, Max assist, +2 for safety/equipment, Used rails Supine to sit: Mod assist, Max assist, Used rails Sit to supine: Max assist, +2 for safety/equipment, Used rails General bed mobility comments: Pt  demonstrated inconsistent abilities throughout session. Mostly self limiting. pt is able to perform task but likes to dicatate how he would like to do it versus safest techniques. extremely poor insight of his abilities    Transfers Overall transfer level: Needs assistance Equipment used: None Transfers: Sit to/from Stand Sit to Stand: Total assist  General transfer comment: pt demoinstrated the appropriate strength required to stand however becomes resistive about mid transfer and was unable to fully achieve upright standing.    Ambulation/Gait  General Gait Details: unable currently but pt was ambulatory prior to admission   Balance Overall balance assessment: Needs assistance Sitting-balance support: Feet supported Sitting balance-Leahy Scale: Good     Standing balance support: Bilateral upper extremity supported Standing balance-Leahy Scale: Poor     Cognition Arousal: Alert Behavior During Therapy: Impulsive, Anxious, Agitated Overall Cognitive Status: Impaired/Different from baseline Area of Impairment: Following commands, Safety/judgement, Awareness, Problem solving  Orientation Level: Disoriented to, Place, Time, Situation   Memory: Decreased recall of precautions, Decreased short-term memory         General Comments: Pt is alert throughout session however confused and disoriented. Has baseline congition deficits but per family, much worse currently. increased tcs/vcs throughout for step by step sequencing               Pertinent Vitals/Pain Pain Assessment Pain Assessment: PAINAD Breathing: occasional labored breathing, short period of hyperventilation Negative Vocalization: occasional moan/groan, low speech, negative/disapproving quality Facial Expression: sad, frightened, frown Body Language: tense, distressed pacing, fidgeting Consolability: distracted or reassured by voice/touch PAINAD Score: 5 Pain Location: R thigh/posterior/HS region Pain Descriptors  / Indicators: Grimacing, Guarding, Moaning Pain Intervention(s): Limited activity within patient's tolerance, Monitored during session, Premedicated before  session, Repositioned     PT Goals (current goals can now be found in the care plan section) Acute Rehab PT Goals Patient Stated Goal: none stated Progress towards PT goals: Progressing toward goals    Frequency    Min 1X/week       Co-evaluation     PT goals addressed during session: Mobility/safety with mobility;Balance;Proper use of DME;Strengthening/ROM        AM-PAC PT "6 Clicks" Mobility   Outcome Measure  Help needed turning from your back to your side while in a flat bed without using bedrails?: A Lot Help needed moving from lying on your back to sitting on the side of a flat bed without using bedrails?: A Lot Help needed moving to and from a bed to a chair (including a wheelchair)?: Total Help needed standing up from a chair using your arms (e.g., wheelchair or bedside chair)?: Total Help needed to walk in hospital room?: Total Help needed climbing 3-5 steps with a railing? : Total 6 Click Score: 8    End of Session   Activity Tolerance: Patient tolerated treatment well;Other (comment) (self limiting) Patient left: in bed;with call bell/phone within reach;with nursing/sitter in room;with bed alarm set Nurse Communication: Mobility status PT Visit Diagnosis: Other abnormalities of gait and mobility (R26.89)     Time: 1255-1310 PT Time Calculation (min) (ACUTE ONLY): 15 min  Charges:    $Therapeutic Activity: 8-22 mins PT General Charges $$ ACUTE PT VISIT: 1 Visit                     Jetta Lout PTA 05/30/23, 1:26 PM

## 2023-05-30 NOTE — Plan of Care (Signed)

## 2023-05-31 DIAGNOSIS — R55 Syncope and collapse: Secondary | ICD-10-CM | POA: Diagnosis not present

## 2023-05-31 DIAGNOSIS — N179 Acute kidney failure, unspecified: Secondary | ICD-10-CM | POA: Diagnosis not present

## 2023-05-31 DIAGNOSIS — R52 Pain, unspecified: Secondary | ICD-10-CM | POA: Diagnosis not present

## 2023-05-31 LAB — CBC WITH DIFFERENTIAL/PLATELET
Abs Immature Granulocytes: 0.12 10*3/uL — ABNORMAL HIGH (ref 0.00–0.07)
Basophils Absolute: 0.1 10*3/uL (ref 0.0–0.1)
Basophils Relative: 1 %
Eosinophils Absolute: 0.4 10*3/uL (ref 0.0–0.5)
Eosinophils Relative: 4 %
HCT: 28.1 % — ABNORMAL LOW (ref 39.0–52.0)
Hemoglobin: 9.5 g/dL — ABNORMAL LOW (ref 13.0–17.0)
Immature Granulocytes: 1 %
Lymphocytes Relative: 17 %
Lymphs Abs: 1.5 10*3/uL (ref 0.7–4.0)
MCH: 30.4 pg (ref 26.0–34.0)
MCHC: 33.8 g/dL (ref 30.0–36.0)
MCV: 90.1 fL (ref 80.0–100.0)
Monocytes Absolute: 0.7 10*3/uL (ref 0.1–1.0)
Monocytes Relative: 8 %
Neutro Abs: 6.2 10*3/uL (ref 1.7–7.7)
Neutrophils Relative %: 69 %
Platelets: 275 10*3/uL (ref 150–400)
RBC: 3.12 MIL/uL — ABNORMAL LOW (ref 4.22–5.81)
RDW: 13.3 % (ref 11.5–15.5)
WBC: 8.9 10*3/uL (ref 4.0–10.5)
nRBC: 0 % (ref 0.0–0.2)

## 2023-05-31 LAB — BASIC METABOLIC PANEL
Anion gap: 12 (ref 5–15)
BUN: 34 mg/dL — ABNORMAL HIGH (ref 8–23)
CO2: 20 mmol/L — ABNORMAL LOW (ref 22–32)
Calcium: 6.9 mg/dL — ABNORMAL LOW (ref 8.9–10.3)
Chloride: 115 mmol/L — ABNORMAL HIGH (ref 98–111)
Creatinine, Ser: 2.03 mg/dL — ABNORMAL HIGH (ref 0.61–1.24)
GFR, Estimated: 32 mL/min — ABNORMAL LOW (ref >60)
Glucose, Bld: 129 mg/dL — ABNORMAL HIGH (ref 70–99)
Potassium: 2.8 mmol/L — ABNORMAL LOW (ref 3.5–5.1)
Sodium: 147 mmol/L — ABNORMAL HIGH (ref 135–145)

## 2023-05-31 LAB — MAGNESIUM: Magnesium: 1.9 mg/dL (ref 1.7–2.4)

## 2023-05-31 LAB — GLUCOSE, CAPILLARY: Glucose-Capillary: 107 mg/dL — ABNORMAL HIGH (ref 70–99)

## 2023-05-31 MED ORDER — POTASSIUM CHLORIDE 20 MEQ PO PACK
40.0000 meq | PACK | ORAL | Status: AC
Start: 2023-05-31 — End: 2023-05-31
  Administered 2023-05-31 (×3): 40 meq via ORAL
  Filled 2023-05-31 (×3): qty 2

## 2023-05-31 NOTE — Progress Notes (Signed)
Progress Note   Patient: Justin Ali QMV:[id number] DOB: 05-01-1940 DOA: 05/19/2023     12 DOS: the patient was seen and examined on 05/31/2023     Brief hospital course: Justin Ali is 83 y.o. male with prostate cancer, hypertension, sinus bradycardia, dementia, who presented after syncopal episode in his independent living facility.  Found at the dining table slumped over.  Blood sugar on arrival 156, blood pressure 90/50, satting 80% on room air.  Denied any recent fever or chills.  ER workup reveals WBC 5.2, hemoglobin 9.3, platelets 149, COVID/flu/RSV negative.  Creatinine 2.65.  Chest x-ray stable apart from known metastatic prostate cancer.     Assessment and Plan:   Metabolic Encephalopathy - multifactorial, electrolyte disturbance + history of dementia+ hospital delirium - Head CT 1/25 negative for acute intracranial abnormalities - Continues to improve, not quite at baseline. Persistent drowsiness today and still inability to eat independently.  Will proceed with brain MRI, possible metastatic spread to the brain versus small/evolving CVA not seen on CT - MRI of the brain did not show any acute or subacute infarct Continue to monitor neurochecks - Infectious workup has thus far been negative - Dopplers negative for DVT I discussed with speech therapist today   Syncope, multifactorial - Orthostatic Hypotension: BP 154/70, pulse 50 bpm.  Standing 115/75 pulse 97 bpm. - Dehydrated on arrival.  Echo reveals EF 55 to 60%, no significant diastolic dysfunction, patient is taking Lasix daily at home (for chronic pedal edema).  Have recommended we switch to PRN dosing - Patient also chronically bradycardic which may be playing a role - Carotid Dopplers: Mild amount of plaque in bilateral carotid arteries.  Bilateral ICA stenosis less than 50%.     Right parotitis Swelling and warmth in right parotid gland region Continue Zosyn   Bradycardia - Resolved now - Appears  chronic on review of prior EKGs - Echo mostly unremarkable   Hypercalcemia, resolved now --  status post zoledronic acid, IV fluids - Likely secondary to malignancy as patient has diffuse osteolytic lesions (redemonstrated on CXR and brain CT) - Appears he has been taking calcium supplements outpatient - stopped - Alk phos within normal limits -- PTH low 8 --Kappa lambda free light chain 26.6 which is slightly high - Nephrology have signed off Oncology following   Hypokalemia Hyperchloremia Hypernatremia Patient received D5 W Continue potassium repletion and monitoring   Dementia - Son reports at baseline patient has short-term memory deficits but is able to complete his ADLs, lives in an independent living facility - Driving status previously revoked Continue PT OT - Head CT within normal limits on arrival, repeat 1/25 WNL - Continue nightly Seroquel Continue delirium precaution   AKI superimposed on chronic kidney disease stage IIIa - Baseline creatinine 1-1.4 with GFR in 40s - Creatinine on arrival 2.65, improving now - Patient was clinically dry on arrival, MIVF as above - Hold nephrotoxic agents - Renally dose when needed   Metastatic prostate cancer - Metastatic 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  Given worsening electrolytes we will hold Zytiga for now. - Appears patient was previously on hospice with a joint decision of his daughter. His son, Justin Ali has recently revoked hospice and is now requesting full aggressive care. - Have engaged oncology during this admission give worsening hypercalcemia -- PSA 1.56 -Have discussed benefits of palliative care  +/-  hospice again with the patient's family, they are aware and considering all options - Have also discussed recommendations to proceed with the FDA approved medications only and to  avoid additional supplements/meds including ivermectin and fenbendazole to avoid complicating his current clinical picture   DVT prophylaxis: lovenox CODE STATUS: DNR/DNI Family Communication: Discussed with caregiver at bedside   Disposition:  Status is: Inpatient, TOC consult for SNF.  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 not yet medically cleared     Subjective:  Patient's mental status is much improved today Denies nausea vomiting chest pain or cough   Physical Exam:   General exam: Appears calm and comfortable, NAD  Respiratory system: No work of breathing, symmetric chest wall expansion Cardiovascular system: S1 & S2 heard, RRR.  Gastrointestinal system: Abdomen is nondistended, soft and nontender.  Neuro: Awake, alert, follows some commands, reports  "I am where I am" when asked about location Extremities: Symmetric, expected ROM Skin: No rashes, lesions Psychiatry: Calm, otherwise difficult to assess     Data reviewed:     Latest Ref Rng & Units 05/31/2023    5:47 AM 05/30/2023    5:26 AM 05/29/2023    4:26 AM  CBC  WBC 4.0 - 10.5 K/uL 8.9  8.7  9.7   Hemoglobin 13.0 - 17.0 g/dL 9.5  56.2  9.6   Hematocrit 39.0 - 52.0 % 28.1  29.5  28.6   Platelets 150 - 400 K/uL 275  261  195        Latest Ref Rng & Units 05/31/2023    5:47 AM 05/30/2023    5:26 AM 05/29/2023    4:26 AM  BMP  Glucose 70 - 99 mg/dL 130  865  784   BUN 8 - 23 mg/dL 34  35  24   Creatinine 0.61 - 1.24 mg/dL 6.96  2.95  2.84   Sodium 135 - 145 mmol/L 147  147  146   Potassium 3.5 - 5.1 mmol/L 2.8  2.9  3.8   Chloride 98 - 111 mmol/L 115  114  116   CO2 22 - 32 mmol/L 20  20  22    Calcium 8.9 - 10.3 mg/dL 6.9  6.8  7.0     Vitals:   05/30/23 2105 05/31/23 0449 05/31/23 0920 05/31/23 1229  BP: 118/73 108/86 116/65 102/70  Pulse: 98 (!) 103 96 93  Resp: 18 18 17    Temp: 98.9 F (37.2 C) 98.8 F (37.1 C) 99.2 F (37.3 C) 98 F (36.7 C)  TempSrc:    Oral   SpO2: 99% 99% 99% 95%  Weight:      Height:         Author: Loyce Dys, MD 05/31/2023 2:45 PM  For on call review [email or web address].

## 2023-05-31 NOTE — Plan of Care (Signed)
  Problem: Education: Goal: Knowledge of condition and prescribed therapy will improve Outcome: Not Progressing   Problem: Education: Goal: Knowledge of General Education information will improve Description: Including pain rating scale, medication(s)/side effects and non-pharmacologic comfort measures Outcome: Not Progressing   Problem: Health Behavior/Discharge Planning: Goal: Ability to manage health-related needs will improve Outcome: Not Progressing   Problem: Clinical Measurements: Goal: Diagnostic test results will improve Outcome: Progressing Goal: Respiratory complications will improve Outcome: Progressing Goal: Cardiovascular complication will be avoided Outcome: Progressing   Problem: Activity: Goal: Risk for activity intolerance will decrease Outcome: Not Progressing   Problem: Nutrition: Goal: Adequate nutrition will be maintained Outcome: Not Progressing

## 2023-05-31 NOTE — Plan of Care (Signed)

## 2023-06-01 DIAGNOSIS — R55 Syncope and collapse: Secondary | ICD-10-CM | POA: Diagnosis not present

## 2023-06-01 DIAGNOSIS — N179 Acute kidney failure, unspecified: Secondary | ICD-10-CM | POA: Diagnosis not present

## 2023-06-01 LAB — CBC WITH DIFFERENTIAL/PLATELET
Abs Immature Granulocytes: 0.16 10*3/uL — ABNORMAL HIGH (ref 0.00–0.07)
Basophils Absolute: 0 10*3/uL (ref 0.0–0.1)
Basophils Relative: 1 %
Eosinophils Absolute: 0.3 10*3/uL (ref 0.0–0.5)
Eosinophils Relative: 4 %
HCT: 27.2 % — ABNORMAL LOW (ref 39.0–52.0)
Hemoglobin: 9 g/dL — ABNORMAL LOW (ref 13.0–17.0)
Immature Granulocytes: 2 %
Lymphocytes Relative: 16 %
Lymphs Abs: 1.4 10*3/uL (ref 0.7–4.0)
MCH: 30.4 pg (ref 26.0–34.0)
MCHC: 33.1 g/dL (ref 30.0–36.0)
MCV: 91.9 fL (ref 80.0–100.0)
Monocytes Absolute: 0.6 10*3/uL (ref 0.1–1.0)
Monocytes Relative: 7 %
Neutro Abs: 5.8 10*3/uL (ref 1.7–7.7)
Neutrophils Relative %: 70 %
Platelets: 271 10*3/uL (ref 150–400)
RBC: 2.96 MIL/uL — ABNORMAL LOW (ref 4.22–5.81)
RDW: 13 % (ref 11.5–15.5)
WBC: 8.3 10*3/uL (ref 4.0–10.5)
nRBC: 0 % (ref 0.0–0.2)

## 2023-06-01 LAB — BASIC METABOLIC PANEL
Anion gap: 10 (ref 5–15)
BUN: 28 mg/dL — ABNORMAL HIGH (ref 8–23)
CO2: 21 mmol/L — ABNORMAL LOW (ref 22–32)
Calcium: 7 mg/dL — ABNORMAL LOW (ref 8.9–10.3)
Chloride: 115 mmol/L — ABNORMAL HIGH (ref 98–111)
Creatinine, Ser: 1.73 mg/dL — ABNORMAL HIGH (ref 0.61–1.24)
GFR, Estimated: 39 mL/min — ABNORMAL LOW (ref >60)
Glucose, Bld: 104 mg/dL — ABNORMAL HIGH (ref 70–99)
Potassium: 3.2 mmol/L — ABNORMAL LOW (ref 3.5–5.1)
Sodium: 146 mmol/L — ABNORMAL HIGH (ref 135–145)

## 2023-06-01 MED ORDER — POTASSIUM CHLORIDE 20 MEQ PO PACK
40.0000 meq | PACK | ORAL | Status: AC
Start: 2023-06-01 — End: 2023-06-01
  Administered 2023-06-01 (×2): 40 meq via ORAL
  Filled 2023-06-01 (×2): qty 2

## 2023-06-01 MED ORDER — DEXTROSE 5 % IV SOLN: INTRAVENOUS | Status: AC

## 2023-06-01 MED ORDER — ENOXAPARIN SODIUM 40 MG/0.4ML IJ SOSY
40.0000 mg | PREFILLED_SYRINGE | INTRAMUSCULAR | Status: DC
  Administered 2023-06-01 – 2023-06-02 (×2): 40 mg via SUBCUTANEOUS
  Filled 2023-06-01 (×2): qty 0.4

## 2023-06-01 NOTE — Progress Notes (Addendum)
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 Upon arrival to room pt in bed with PCA at bedside, agreeable to tx. Pt requires MIN A exit bed, noted to have small stool incontinence. MIN A + RW sit<>stand x2 trials with MAX A for pericare in standing. Increased cues to sequence side steps. Reviewed seated HEP. Left sitting EOB with caregiver and RN in room. Pt making good progress toward goals, will continue to follow POC. Discharge recommendation remains appropriate.       If plan is discharge home, recommend the following:  A little help with walking and/or transfers;A little help with bathing/dressing/bathroom;Supervision due to cognitive status;Direct supervision/assist for financial management;Direct supervision/assist for medications management   Equipment Recommendations  Other (comment) (defer)    Recommendations for Other Services      Precautions / Restrictions Precautions Precautions: Fall Precaution Comments: watch BP and oxygen Restrictions Weight Bearing Restrictions Per Provider Order: No       Mobility Bed Mobility Overal bed mobility: Needs Assistance Bed Mobility: Supine to Sit     Supine to sit: Min assist          Transfers Overall transfer level: Needs assistance Equipment used: Rolling walker (2 wheels) Transfers: Sit to/from Stand Sit to Stand: Min assist, From elevated surface                 Balance Overall balance assessment: Needs assistance Sitting-balance support: Feet supported Sitting balance-Leahy Scale: Good     Standing balance support: Bilateral upper extremity  supported Standing balance-Leahy Scale: Poor                             ADL either performed or assessed with clinical judgement   ADL Overall ADL's : Needs assistance/impaired                                       General ADL Comments: MAX A pericare standing      Cognition Arousal: Alert Behavior During Therapy: Flat affect Overall Cognitive Status: Impaired/Different from baseline Area of Impairment: Following commands, Safety/judgement, Awareness, Problem solving                     Memory: Decreased recall of precautions, Decreased short-term memory Following Commands: Follows one step commands consistently                           Pertinent Vitals/ Pain       Pain Assessment Pain Assessment: No/denies pain   Frequency  Min 1X/week        Progress Toward Goals  OT Goals(current goals can now be found in the care plan section)  Progress towards OT goals: Progressing toward goals  Acute Rehab OT Goals OT Goal Formulation: With patient Time For Goal Achievement: 06/03/23 Potential to Achieve Goals: Good ADL Goals Pt Will Perform Grooming: sitting;with set-up;with supervision Pt Will Perform Lower Body Dressing: sit to/from stand;with min assist Pt Will Transfer to Toilet: bedside commode;stand pivot transfer;with  min assist Pt Will Perform Toileting - Clothing Manipulation and hygiene: sitting/lateral leans;sit to/from stand;with min assist  Plan      Co-evaluation        PT goals addressed during session: Mobility/safety with mobility;Balance;Proper use of DME;Strengthening/ROM        AM-PAC OT "6 Clicks" Daily Activity     Outcome Measure   Help from another person eating meals?: A Little Help from another person taking care of personal grooming?: A Little Help from another person toileting, which includes using toliet, bedpan, or urinal?: A Lot Help from another person bathing (including washing,  rinsing, drying)?: A Lot Help from another person to put on and taking off regular upper body clothing?: A Little Help from another person to put on and taking off regular lower body clothing?: A Lot 6 Click Score: 15    End of Session Equipment Utilized During Treatment: Rolling walker (2 wheels)  OT Visit Diagnosis: Other abnormalities of gait and mobility (R26.89);Unsteadiness on feet (R26.81)   Activity Tolerance Patient tolerated treatment well   Patient Left in bed;with call bell/phone within reach;with nursing/sitter in room;with family/visitor present   Nurse Communication Mobility status        Time: 9147-8295 OT Time Calculation (min): 10 min  Charges: OT General Charges $OT Visit: 1 Visit OT Treatments $Self Care/Home Management : 8-22 mins  Kathie Dike, M.S. OTR/L  06/01/23, 3:27 PM  ascom [phone]

## 2023-06-01 NOTE — Plan of Care (Signed)
  Problem: Cardiac: Goal: Will achieve and/or maintain adequate cardiac output Outcome: Progressing   Problem: Physical Regulation: Goal: Complications related to the disease process, condition or treatment will be avoided or minimized Outcome: Progressing   Problem: Health Behavior/Discharge Planning: Goal: Ability to manage health-related needs will improve Outcome: Progressing   Problem: Clinical Measurements: Goal: Ability to maintain clinical measurements within normal limits will improve Outcome: Progressing Goal: Will remain free from infection Outcome: Progressing Goal: Diagnostic test results will improve Outcome: Progressing Goal: Respiratory complications will improve Outcome: Progressing Goal: Cardiovascular complication will be avoided Outcome: Progressing

## 2023-06-01 NOTE — Progress Notes (Signed)
Progress Note   Patient: Justin Ali ZOX:[id number] DOB: 1941-03-17 DOA: 05/19/2023     13 DOS: the patient was seen and examined on 06/01/2023     Brief hospital course: [name] is 83 y.o. male with prostate cancer, hypertension, sinus bradycardia, dementia, who presented after syncopal episode in his independent living facility.  Found at the dining table slumped over.  Blood sugar on arrival 156, blood pressure 90/50, satting 80% on room air.  Denied any recent fever or chills.  ER workup reveals WBC 5.2, hemoglobin 9.3, platelets 149, COVID/flu/RSV negative.  Creatinine 2.65.  Chest x-ray stable apart from known metastatic prostate cancer.     Assessment and Plan:   Acute metabolic Encephalopathy This is likely multifactorial, electrolyte disturbance + history of dementia+ hospital delirium Head CT 1/25 negative for acute intracranial abnormalities Mental status currently improved to baseline Continue PT OT Therapist has recommended discharge to rehab [facility] I spoke with patient's son as well as the daughter-in-law, they did do not want patient transferred to the [facility] hospital however when patient is medically cleared and ready for discharge they would like TOC to work on discharge to a rehab in the [place].   Syncope, multifactorial - Orthostatic Hypotension:  Patient was positive for orthostatic hypotension Blood pressure improved Continue current IV fluid     Right parotitis Swelling and warmth in right parotid gland region improved Continue Zosyn   Bradycardia-resolved Continue to monitor closely   Hypercalcemia, resolved now --  status post zoledronic acid, IV fluids - Likely secondary to malignancy as patient has diffuse osteolytic lesions (redemonstrated on CXR and brain CT) - Appears he has been taking calcium supplements outpatient - stopped - Alk phos within normal limits -- PTH low 8 --Kappa lambda free light chain 26.6 which is  slightly high - Nephrology have signed off Outpatient follow-up with oncologist   Hypokalemia Hyperchloremia Hypernatremia Patient received D5 W Continue potassium repletion and monitoring   Dementia Continue delirium precaution   AKI superimposed on chronic kidney disease stage IIIa - Baseline creatinine 1-1.4 with GFR in 40s - Creatinine on arrival 2.65, improving now Monitor renal function closely   Metastatic prostate cancer - Metastatic 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  Given worsening electrolytes we will hold Zytiga for now. - Appears patient was previously on hospice with a joint decision of his daughter. His son, Justin Ali has recently revoked hospice and is now requesting full aggressive care. - Have engaged oncology during this admission give worsening hypercalcemia -- PSA 1.56 -Have discussed benefits of palliative care  +/- hospice again with the patient's family, they are aware and considering all options - Have also discussed recommendations to proceed with the FDA approved medications only and to avoid additional supplements/meds including ivermectin and fenbendazole to avoid complicating his current clinical picture   DVT prophylaxis: lovenox  CODE STATUS: DNR/DNI  Family Communication: Discussed with caregiver at bedside as well as patient's son   Disposition:  Status is: Inpatient, TOC consult for SNF.  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  Gastrointestinal system: Abdomen is nondistended, soft and nontender.  Neuro: Awake, alert, follows some commands Extremities: Symmetric, expected ROM Skin: No rashes, lesions Psychiatry: Calm, otherwise difficult to assess     Data reviewed:    Latest Ref Rng & Units 06/01/2023    5:52 AM 05/31/2023    5:47 AM 05/30/2023    5:26 AM  BMP  Glucose 70 - 99 mg/dL 644  034  742   BUN 8 - 23 mg/dL 28  34  35   Creatinine 0.61 - 1.24 mg/dL 5.95  6.38  7.56   Sodium 135 - 145 mmol/L 146  147  147   Potassium 3.5 - 5.1 mmol/L 3.2  2.8  2.9   Chloride 98 - 111 mmol/L 115  115  114   CO2 22 - 32 mmol/L 21  20  20    Calcium 8.9 - 10.3 mg/dL 7.0  6.9  6.8      Vitals:   05/31/23 1229 05/31/23 1715 05/31/23 2100 06/01/23 0727  BP: 102/70 108/65 132/68 115/66  Pulse: 93 87 78 87  Resp:   18 18  Temp: 98 F (36.7 C) (!) 97.5 F (36.4 C) 98.7 F (37.1 C) 99 F (37.2 C)  TempSrc: Oral     SpO2: 95% 100% 99% 99%  Weight:      Height:          Latest Ref Rng & Units 06/01/2023    5:52 AM 05/31/2023    5:47 AM 05/30/2023    5:26 AM  CBC  WBC 4.0 - 10.5 K/uL 8.3  8.9  8.7   Hemoglobin 13.0 - 17.0 g/dL 9.0  9.5  43.3   Hematocrit 39.0 - 52.0 % 27.2  28.1  29.5   Platelets 150 - 400 K/uL 271  275  261      Author: Loyce Dys, MD 06/01/2023 3:45 PM  For on call review [email or web address].

## 2023-06-01 NOTE — Progress Notes (Signed)
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  Pt's caregiver in room with pt upon arrival.  Pt noted to have a BM upon arrival, and caregiver assisted therapist in cleaning pt and performing linen change.  Pt requires significant cuing to come upright to EOB and required modA from +2 to come to the EOB.  Pt then assisted into standing while nurse tech arrived to the room and was able to stand with walker and +2 modA while being cleaned.  Pt then able to take 2 small sidesteps towards the [facility] before needing to sit down.  Pt then needing to have another BM and was assisted on the bedpan.  Pt required verbal cues for multiple times to have BM since he was on bedpan, however did not understand.  Pt eventuall did understand and was able to have BM.  Pt then assisted with peri-care once again.  Pt still unaware of his deficits at this time.  Pt then boosted up into the bed and left with all needs met.    Pt noted to have wound on L side of his back and nursing notified.  Nurse tech and nurse desk also notified of the wound and stated they would notify pt's nurse.    If plan is discharge home, recommend the following: Two people to help with walking and/or transfers;Two people to help with bathing/dressing/bathroom;Assistance with feeding;Direct supervision/assist for financial management;Direct supervision/assist for medications management   Can travel by private vehicle        Equipment Recommendations  None recommended by PT    Recommendations for Other Services       Precautions / Restrictions Precautions Precautions: Fall Precaution  Comments: watch BP and oxygen Restrictions Weight Bearing Restrictions Per Provider Order: No     Mobility  Bed Mobility Overal bed mobility: Needs Assistance Bed Mobility: Supine to Sit     Supine to sit: Mod assist, Max assist, Used rails Sit to supine: Max assist, +2 for safety/equipment, Used rails   General bed mobility comments: Pt demonstrated inconsistent abilities throughout session. Mostly self limiting. pt is able to perform task but likes to dicatate how he would like to do it versus safest techniques. extremely poor insight of his abilities    Transfers Overall transfer level: Needs assistance Equipment used: Rolling walker (2 wheels) Transfers: Sit to/from Stand Sit to Stand: Mod assist, +2 physical assistance, +2 safety/equipment           General transfer comment: pt able to stand upright with +2 assistance    Ambulation/Gait               General Gait Details: unable currently but pt was ambulatory prior to admission;  pt able to take 2 small steps towards HOB.   Stairs             Wheelchair Mobility     Tilt Bed    Modified Rankin (Stroke Patients Only)       Balance Overall balance assessment: Needs assistance Sitting-balance support: Feet supported Sitting balance-Leahy Scale: Good     Standing balance support: Bilateral upper extremity supported Standing balance-Leahy Scale: Poor  Cognition Arousal: Alert Behavior During Therapy: Impulsive, Anxious, Agitated Overall Cognitive Status: Impaired/Different from baseline Area of Impairment: Following commands, Safety/judgement, Awareness, Problem solving                 Orientation Level: Disoriented to, Place, Time, Situation   Memory: Decreased recall of precautions, Decreased short-term memory Following Commands: Follows one step commands inconsistently       General Comments: Pt is alert throughout session however  confused and disoriented. Has baseline congition deficits but per family, much worse currently. increased tcs/vcs throughout for step by step sequencing        Exercises      General Comments        Pertinent Vitals/Pain Pain Assessment Pain Assessment: Faces Faces Pain Scale: Hurts even more    Home Living                          Prior Function            PT Goals (current goals can now be found in the care plan section) Acute Rehab PT Goals Patient Stated Goal: none stated PT Goal Formulation: Patient unable to participate in goal setting Time For Goal Achievement: 05/29/23 Potential to Achieve Goals: Poor Progress towards PT goals: Progressing toward goals    Frequency    Min 1X/week      PT Plan      Co-evaluation     PT goals addressed during session: Mobility/safety with mobility;Balance;Proper use of DME;Strengthening/ROM        AM-PAC PT "6 Clicks" Mobility   Outcome Measure  Help needed turning from your back to your side while in a flat bed without using bedrails?: A Lot Help needed moving from lying on your back to sitting on the side of a flat bed without using bedrails?: A Lot Help needed moving to and from a bed to a chair (including a wheelchair)?: Total Help needed standing up from a chair using your arms (e.g., wheelchair or bedside chair)?: Total Help needed to walk in hospital room?: Total Help needed climbing 3-5 steps with a railing? : Total 6 Click Score: 8    End of Session Equipment Utilized During Treatment: Gait belt Activity Tolerance: Patient tolerated treatment well;Other (comment) Patient left: in bed;with call bell/phone within reach;with nursing/sitter in room;with bed alarm set Nurse Communication: Mobility status PT Visit Diagnosis: Other abnormalities of gait and mobility (R26.89)     Time: 1610-9604 PT Time Calculation (min) (ACUTE ONLY): 30 min  Charges:    $Therapeutic Activity: 23-37 mins PT  General Charges $$ ACUTE PT VISIT: 1 Visit                     Nolon Bussing, PT, DPT Physical Therapist - [facility]  [facility]  06/01/23, 11:58 AM

## 2023-06-01 NOTE — TOC Progression Note (Signed)
Transition of Care [facility]) - Progression Note    Patient Details  Name: CALEN GEISTER MRN: [id number] Date of Birth: 24-Jul-1940  Transition of Care [facility]) CM/SW Contact  Allena Katz, LCSW Phone Number: 06/01/2023, 10:57 AM  Clinical Narrative:     CSW spoke with son who states he wants him transferred to the [place]. CSW gave call information of [phone] ext [phone] to MD to follow up on transfer.   Expected Discharge Plan: Home w Home Health Services Barriers to Discharge: Continued Medical Work up  Expected Discharge Plan and Services                                               Social Determinants of Health (SDOH) Interventions SDOH Screenings   Food Insecurity: No Food Insecurity (05/19/2023)  Housing: Low Risk  (05/19/2023)  Transportation Needs: No Transportation Needs (05/19/2023)  Utilities: Not At Risk (05/19/2023)  Alcohol Screen: Low Risk  (07/16/2022)  Depression (PHQ2-9): Low Risk  (03/13/2023)  Recent Concern: Depression (PHQ2-9) - High Risk (01/16/2023)  Financial Resource Strain: Low Risk  (07/16/2022)  Physical Activity: Insufficiently Active (07/16/2022)  Social Connections: Socially Isolated (05/19/2023)  Stress: Stress Concern Present (07/16/2022)  Tobacco Use: Medium Risk (05/19/2023)    Readmission Risk Interventions     No data to display

## 2023-06-01 NOTE — Progress Notes (Signed)
Anticoagulation monitoring(Lovenox):  82yo  M ordered Lovenox 30 mg Q24h    Filed Weights   05/29/23 0018 05/29/23 0500 05/30/23 0500  Weight: 71 kg (156 lb 8.4 oz) 71.2 kg (156 lb 15.5 oz) 70.5 kg (155 lb 6.8 oz)   BMI 25   Lab Results  Component Value Date   CREATININE 1.73 (H) 06/01/2023   CREATININE 2.03 (H) 05/31/2023   CREATININE 2.10 (H) 05/30/2023   Estimated Creatinine Clearance: 30.2 mL/min (A) (by C-G formula based on SCr of 1.73 mg/dL (H)). Hemoglobin & Hematocrit     Component Value Date/Time   HGB 9.0 (L) 06/01/2023 0552   HGB 12.3 (L) 02/16/2013 0523   HCT 27.2 (L) 06/01/2023 0552   HCT 35.6 (L) 02/16/2013 4098     Per Protocol for Patient with estCrcl >/= 30 ml/min and BMI < 30, will transition to Lovenox 40 mg Q24h      Bari Mantis PharmD Clinical Pharmacist 06/01/2023

## 2023-06-02 DIAGNOSIS — N179 Acute kidney failure, unspecified: Secondary | ICD-10-CM | POA: Diagnosis not present

## 2023-06-02 DIAGNOSIS — R55 Syncope and collapse: Secondary | ICD-10-CM | POA: Diagnosis not present

## 2023-06-02 LAB — BASIC METABOLIC PANEL
Anion gap: 9 (ref 5–15)
BUN: 20 mg/dL (ref 8–23)
CO2: 20 mmol/L — ABNORMAL LOW (ref 22–32)
Calcium: 7.5 mg/dL — ABNORMAL LOW (ref 8.9–10.3)
Chloride: 113 mmol/L — ABNORMAL HIGH (ref 98–111)
Creatinine, Ser: 1.43 mg/dL — ABNORMAL HIGH (ref 0.61–1.24)
GFR, Estimated: 49 mL/min — ABNORMAL LOW (ref >60)
Glucose, Bld: 98 mg/dL (ref 70–99)
Potassium: 3.3 mmol/L — ABNORMAL LOW (ref 3.5–5.1)
Sodium: 142 mmol/L (ref 135–145)

## 2023-06-02 LAB — CBC WITH DIFFERENTIAL/PLATELET
Abs Immature Granulocytes: 0.12 10*3/uL — ABNORMAL HIGH (ref 0.00–0.07)
Basophils Absolute: 0.1 10*3/uL (ref 0.0–0.1)
Basophils Relative: 1 %
Eosinophils Absolute: 0.3 10*3/uL (ref 0.0–0.5)
Eosinophils Relative: 4 %
HCT: 26.2 % — ABNORMAL LOW (ref 39.0–52.0)
Hemoglobin: 8.7 g/dL — ABNORMAL LOW (ref 13.0–17.0)
Immature Granulocytes: 2 %
Lymphocytes Relative: 19 %
Lymphs Abs: 1.5 10*3/uL (ref 0.7–4.0)
MCH: 30.4 pg (ref 26.0–34.0)
MCHC: 33.2 g/dL (ref 30.0–36.0)
MCV: 91.6 fL (ref 80.0–100.0)
Monocytes Absolute: 0.4 10*3/uL (ref 0.1–1.0)
Monocytes Relative: 6 %
Neutro Abs: 5.3 10*3/uL (ref 1.7–7.7)
Neutrophils Relative %: 68 %
Platelets: 279 10*3/uL (ref 150–400)
RBC: 2.86 MIL/uL — ABNORMAL LOW (ref 4.22–5.81)
RDW: 12.9 % (ref 11.5–15.5)
WBC: 7.7 10*3/uL (ref 4.0–10.5)
nRBC: 0 % (ref 0.0–0.2)

## 2023-06-02 MED ORDER — HALOPERIDOL LACTATE 5 MG/ML IJ SOLN
1.0000 mg | Freq: Once | INTRAMUSCULAR | Status: AC
  Administered 2023-06-02: 1 mg via INTRAVENOUS
  Filled 2023-06-02: qty 1

## 2023-06-02 MED ORDER — POTASSIUM CHLORIDE 20 MEQ PO PACK
40.0000 meq | PACK | ORAL | Status: AC
  Administered 2023-06-02 (×2): 40 meq via ORAL
  Filled 2023-06-02 (×2): qty 2

## 2023-06-02 NOTE — Progress Notes (Signed)
 Progress Note   Patient: Justin Ali FMW:[id number] DOB: 18-May-1940 DOA: 05/19/2023     14 DOS: the patient was seen and examined on 06/02/2023    Brief hospital course: Justin Ali is 83 y.o. male with prostate cancer, hypertension, sinus bradycardia, dementia, who presented after syncopal episode in his independent living facility.  Found at the dining table slumped over.  Blood sugar on arrival 156, blood pressure 90/50, satting 80% on room air.  Denied any recent fever or chills.  ER workup reveals WBC 5.2, hemoglobin 9.3, platelets 149, COVID/flu/RSV negative.  Creatinine 2.65.  Chest x-ray stable apart from known metastatic prostate cancer.     Assessment and Plan:   Acute metabolic Encephalopathy This is likely multifactorial, electrolyte disturbance + history of dementia+ hospital delirium Head CT 1/25 negative for acute intracranial abnormalities Mental status currently improved to baseline Continue PT OT Therapist has recommended discharge to rehab I have discussed with [facility] manager I spoke with patient's son as well as the daughter-in-law, they did do not want patient transferred to the [facility] hospital however when patient is medically cleared and ready for discharge they would like TOC to work on discharge to a rehab in the [place].   Syncope, multifactorial - Orthostatic Hypotension:  Patient was positive for orthostatic hypotension Blood pressure improved Continue current IV fluid     Right parotitis Swelling and warmth in right parotid gland region improved Continue Zosyn  to be transitioned to Augmentin  at discharge   Bradycardia-resolved Continue to monitor closely   Hypercalcemia, resolved now --  status post zoledronic  acid, IV fluids - Likely secondary to malignancy as patient has diffuse osteolytic lesions (redemonstrated on CXR and brain CT) - Appears he has been taking calcium  supplements outpatient - stopped - Alk phos within normal limits -- PTH low  8 --Kappa lambda free light chain 26.6 which is slightly high - Nephrology have signed off Outpatient follow-up with oncologist   Hypokalemia Hyperchloremia Hypernatremia Patient received D5 W Continue potassium repletion and monitoring   Dementia Continue delirium precaution   AKI superimposed on chronic kidney disease stage IIIa - Baseline creatinine 1-1.4 with GFR in 40s - Creatinine on arrival 2.65, improving now Monitor renal function closely   Metastatic prostate cancer - Metastatic spread to the bones, numerous sclerotic lesions to axial and appendicular skeleton and skull - Previously on Zytiga  and prednisone , appears only on Zytiga  at time of admission.  Given worsening electrolytes we will hold Zytiga  for now. - Appears patient was previously on hospice with a joint decision of his daughter. His son, Justin Ali has recently revoked hospice and is now requesting full aggressive care. - Have engaged oncology during this admission give worsening hypercalcemia -- PSA 1.56 -Have discussed benefits of palliative care  +/- hospice again with the patient's family, they are aware and considering all options - Have also discussed recommendations to proceed with the FDA approved medications only and to avoid additional supplements/meds including ivermectin and fenbendazole to avoid complicating his current clinical picture   DVT prophylaxis: lovenox    CODE STATUS: DNR/DNI   Family Communication: Discussed with caregiver at bedside as well as patient's son   Disposition:  Status is: Inpatient, TOC consult for SNF.  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  [facility] not yet medically [facility]     Subjective:  Patient seen and examined this morning Mental status is back to baseline now Denies nausea vomiting abdominal pain or chest pain Right  parotid gland enlargement almost resolved  Physical Exam:   General exam: Appears calm and comfortable, NAD   Respiratory system: No work of breathing, symmetric chest wall expansion Cardiovascular system: S1 & S2 heard, RRR.  Gastrointestinal system: Abdomen is nondistended, soft and nontender.  Neuro: Awake, alert, follows some commands Extremities: Symmetric, expected ROM Skin: No rashes, lesions Psychiatry: Calm, otherwise difficult to assess     Data reviewed:   Vitals:   06/02/23 0358 06/02/23 0410 06/02/23 0834 06/02/23 1616  BP: 123/61  118/72 129/64  Pulse: 86  76 80  Resp: 16  18 16   Temp: (!) 97.3 F (36.3 C)   98.3 F (36.8 C)  TempSrc:      SpO2: 99%  97% 98%  Weight:  71.2 kg    Height:          Latest Ref Rng & Units 06/02/2023    6:28 AM 06/01/2023    5:52 AM 05/31/2023    5:47 AM  CBC  WBC 4.0 - 10.5 K/uL 7.7  8.3  8.9   Hemoglobin 13.0 - 17.0 g/dL 8.7  9.0  9.5   Hematocrit 39.0 - 52.0 % 26.2  27.2  28.1   Platelets 150 - 400 K/uL 279  271  275        Latest Ref Rng & Units 06/02/2023    6:28 AM 06/01/2023    5:52 AM 05/31/2023    5:47 AM  BMP  Glucose 70 - 99 mg/dL 98  895  870   BUN 8 - 23 mg/dL 20  28  34   Creatinine 0.61 - 1.24 mg/dL 8.56  8.26  7.96   Sodium 135 - 145 mmol/L 142  146  147   Potassium 3.5 - 5.1 mmol/L 3.3  3.2  2.8   Chloride 98 - 111 mmol/L 113  115  115   CO2 22 - 32 mmol/L 20  21  20    Calcium  8.9 - 10.3 mg/dL 7.5  7.0  6.9       Author: Drue ONEIDA Potter, MD 06/02/2023 4:54 PM  For on call review [email or web address].

## 2023-06-02 NOTE — TOC Progression Note (Signed)
 Transition of Care [facility]) - Progression Note    Patient Details  Name: Justin Ali MRN: [id number] Date of Birth: 1941/01/22  Transition of Care [facility]) CM/SW Contact  Ladene Lady, LCSW Phone Number: 06/02/2023, 11:15 AM  Clinical Narrative:     [facility] to change service connection and it may not even happen it is based on the veteran becoming disabled due to a cause that happened while in service.   CSW has shared this with son he is agreeable now to [facility] under [facility]'s [employer].  Expected Discharge Plan: Home w Home Health Services Barriers to Discharge: Continued Medical Work up  Expected Discharge Plan and Services                                               Social Determinants of Health (SDOH) Interventions SDOH Screenings   Food Insecurity: No Food Insecurity (05/19/2023)  Housing: Low Risk  (05/19/2023)  Transportation Needs: No Transportation Needs (05/19/2023)  Utilities: Not At Risk (05/19/2023)  Alcohol Screen: Low Risk  (07/16/2022)  Depression (PHQ2-9): Low Risk  (03/13/2023)  Recent Concern: Depression (PHQ2-9) - High Risk (01/16/2023)  Financial Resource Strain: Low Risk  (07/16/2022)  Physical Activity: Insufficiently Active (07/16/2022)  Social Connections: Socially Isolated (05/19/2023)  Stress: Stress Concern Present (07/16/2022)  Tobacco Use: Medium Risk (05/19/2023)    Readmission Risk Interventions     No data to display

## 2023-06-03 DIAGNOSIS — R001 Bradycardia, unspecified: Secondary | ICD-10-CM | POA: Diagnosis not present

## 2023-06-03 DIAGNOSIS — F0390 Unspecified dementia without behavioral disturbance: Secondary | ICD-10-CM | POA: Diagnosis not present

## 2023-06-03 DIAGNOSIS — K219 Gastro-esophageal reflux disease without esophagitis: Secondary | ICD-10-CM | POA: Diagnosis not present

## 2023-06-03 DIAGNOSIS — R2681 Unsteadiness on feet: Secondary | ICD-10-CM | POA: Diagnosis not present

## 2023-06-03 DIAGNOSIS — N179 Acute kidney failure, unspecified: Secondary | ICD-10-CM | POA: Diagnosis not present

## 2023-06-03 DIAGNOSIS — E569 Vitamin deficiency, unspecified: Secondary | ICD-10-CM | POA: Diagnosis not present

## 2023-06-03 DIAGNOSIS — Z741 Need for assistance with personal care: Secondary | ICD-10-CM | POA: Diagnosis not present

## 2023-06-03 DIAGNOSIS — S61511A Laceration without foreign body of right wrist, initial encounter: Secondary | ICD-10-CM | POA: Diagnosis present

## 2023-06-03 DIAGNOSIS — N1831 Chronic kidney disease, stage 3a: Secondary | ICD-10-CM | POA: Diagnosis not present

## 2023-06-03 DIAGNOSIS — G9341 Metabolic encephalopathy: Secondary | ICD-10-CM | POA: Diagnosis not present

## 2023-06-03 DIAGNOSIS — N390 Urinary tract infection, site not specified: Secondary | ICD-10-CM | POA: Diagnosis not present

## 2023-06-03 DIAGNOSIS — M6259 Muscle wasting and atrophy, not elsewhere classified, multiple sites: Secondary | ICD-10-CM | POA: Diagnosis not present

## 2023-06-03 DIAGNOSIS — I951 Orthostatic hypotension: Secondary | ICD-10-CM | POA: Diagnosis not present

## 2023-06-03 DIAGNOSIS — I10 Essential (primary) hypertension: Secondary | ICD-10-CM | POA: Diagnosis not present

## 2023-06-03 DIAGNOSIS — G4700 Insomnia, unspecified: Secondary | ICD-10-CM | POA: Diagnosis not present

## 2023-06-03 DIAGNOSIS — C61 Malignant neoplasm of prostate: Secondary | ICD-10-CM | POA: Diagnosis not present

## 2023-06-03 DIAGNOSIS — E876 Hypokalemia: Secondary | ICD-10-CM | POA: Diagnosis not present

## 2023-06-03 DIAGNOSIS — K1120 Sialoadenitis, unspecified: Secondary | ICD-10-CM | POA: Diagnosis not present

## 2023-06-03 DIAGNOSIS — S0101XA Laceration without foreign body of scalp, initial encounter: Secondary | ICD-10-CM | POA: Diagnosis not present

## 2023-06-03 DIAGNOSIS — E785 Hyperlipidemia, unspecified: Secondary | ICD-10-CM | POA: Diagnosis not present

## 2023-06-03 LAB — BASIC METABOLIC PANEL
Anion gap: 8 (ref 5–15)
BUN: 17 mg/dL (ref 8–23)
CO2: 19 mmol/L — ABNORMAL LOW (ref 22–32)
Calcium: 8.7 mg/dL — ABNORMAL LOW (ref 8.9–10.3)
Chloride: 115 mmol/L — ABNORMAL HIGH (ref 98–111)
Creatinine, Ser: 1.42 mg/dL — ABNORMAL HIGH (ref 0.61–1.24)
GFR, Estimated: 50 mL/min — ABNORMAL LOW (ref >60)
Glucose, Bld: 93 mg/dL (ref 70–99)
Potassium: 3.7 mmol/L (ref 3.5–5.1)
Sodium: 142 mmol/L (ref 135–145)

## 2023-06-03 LAB — CBC WITH DIFFERENTIAL/PLATELET
Abs Immature Granulocytes: 0.13 10*3/uL — ABNORMAL HIGH (ref 0.00–0.07)
Basophils Absolute: 0 10*3/uL (ref 0.0–0.1)
Basophils Relative: 1 %
Eosinophils Absolute: 0.3 10*3/uL (ref 0.0–0.5)
Eosinophils Relative: 4 %
HCT: 27.9 % — ABNORMAL LOW (ref 39.0–52.0)
Hemoglobin: 9.3 g/dL — ABNORMAL LOW (ref 13.0–17.0)
Immature Granulocytes: 2 %
Lymphocytes Relative: 19 %
Lymphs Abs: 1.5 10*3/uL (ref 0.7–4.0)
MCH: 30.2 pg (ref 26.0–34.0)
MCHC: 33.3 g/dL (ref 30.0–36.0)
MCV: 90.6 fL (ref 80.0–100.0)
Monocytes Absolute: 0.5 10*3/uL (ref 0.1–1.0)
Monocytes Relative: 7 %
Neutro Abs: 5.3 10*3/uL (ref 1.7–7.7)
Neutrophils Relative %: 67 %
Platelets: 322 10*3/uL (ref 150–400)
RBC: 3.08 MIL/uL — ABNORMAL LOW (ref 4.22–5.81)
RDW: 12.7 % (ref 11.5–15.5)
WBC: 7.8 10*3/uL (ref 4.0–10.5)
nRBC: 0 % (ref 0.0–0.2)

## 2023-06-03 LAB — MAGNESIUM: Magnesium: 1.7 mg/dL (ref 1.7–2.4)

## 2023-06-03 LAB — GLUCOSE, CAPILLARY: Glucose-Capillary: 74 mg/dL (ref 70–99)

## 2023-06-03 MED ORDER — AMOXICILLIN-POT CLAVULANATE 875-125 MG PO TABS: 1.0000 | ORAL_TABLET | Freq: Two times a day (BID) | ORAL | Status: AC

## 2023-06-03 MED ORDER — ENSURE ENLIVE PO LIQD: 237.0000 mL | Freq: Two times a day (BID) | ORAL | Status: DC

## 2023-06-03 MED ORDER — FUROSEMIDE 20 MG PO TABS: 20.0000 mg | ORAL_TABLET | Freq: Every day | ORAL | Status: DC | PRN

## 2023-06-03 MED ORDER — AMLODIPINE BESYLATE 5 MG PO TABS: 5.0000 mg | ORAL_TABLET | Freq: Every day | ORAL | Status: DC

## 2023-06-03 MED ORDER — ONDANSETRON HCL 4 MG PO TABS: 4.0000 mg | ORAL_TABLET | Freq: Four times a day (QID) | ORAL | Status: DC | PRN

## 2023-06-03 MED ORDER — TRAMADOL HCL 50 MG PO TABS: 50.0000 mg | ORAL_TABLET | Freq: Two times a day (BID) | ORAL | 0 refills | Status: DC | PRN

## 2023-06-03 NOTE — TOC Transition Note (Addendum)
 Transition of Care [facility]) - Discharge Note   Patient Details  Name: Justin Ali MRN: [id number] Date of Birth: 10/10/1940  Transition of Care [facility]) CM/SW Contact:  Ladene Lady, LCSW Phone Number: 06/03/2023, 11:23 AM   Clinical Narrative:   Pt has [facility] to [facility] room 602[facility]. Dc summary sent. DNR signed and on the chart. Son notified. Tammy notified. Medical necessity on chart. CSW notified son kevin.    Final next level of care: Skilled Nursing Facility Barriers to Discharge: Barriers Resolved   Patient Goals and CMS Choice Patient states their goals for this hospitalization and ongoing recovery are:: return to [place] CMS Medicare.gov Compare Post Acute Care list provided to:: Patient Represenative (must comment) (Son) Choice offered to / list presented to : Adult Children      Discharge Placement              [facility] at: [facility] [place] Patient to be transferred to facility by: [facility] Name of family member notified: kevin Patient and family notified of of transfer: 06/03/23  Discharge Plan and Services Additional resources added to the After Visit Summary for                                       Social Drivers of Health (SDOH) Interventions SDOH Screenings   Food Insecurity: No Food Insecurity (05/19/2023)  Housing: Low Risk  (05/19/2023)  Transportation Needs: No Transportation Needs (05/19/2023)  Utilities: Not At Risk (05/19/2023)  Alcohol Screen: Low Risk  (07/16/2022)  Depression (PHQ2-9): Low Risk  (03/13/2023)  Recent Concern: Depression (PHQ2-9) - High Risk (01/16/2023)  Financial Resource Strain: Low Risk  (07/16/2022)  Physical Activity: Insufficiently Active (07/16/2022)  Social Connections: Socially Isolated (05/19/2023)  Stress: Stress Concern Present (07/16/2022)  Tobacco Use: Medium Risk (05/19/2023)     Readmission Risk Interventions     No data to display

## 2023-06-03 NOTE — Plan of Care (Signed)
  Problem: Cardiac: Goal: Will achieve and/or maintain adequate cardiac output Outcome: Progressing   Problem: Physical Regulation: Goal: Complications related to the disease process, condition or treatment will be avoided or minimized Outcome: Progressing   Problem: Clinical Measurements: Goal: Ability to maintain clinical measurements within normal limits will improve Outcome: Progressing Goal: Will remain free from infection Outcome: Progressing Goal: Diagnostic test results will improve Outcome: Progressing Goal: Respiratory complications will improve Outcome: Progressing Goal: Cardiovascular complication will be avoided Outcome: Progressing   Problem: Activity: Goal: Risk for activity intolerance will decrease Outcome: Progressing   Problem: Nutrition: Goal: Adequate nutrition will be maintained Outcome: Progressing   Problem: Elimination: Goal: Will not experience complications related to bowel motility Outcome: Progressing Goal: Will not experience complications related to urinary retention Outcome: Progressing   Problem: Pain Managment: Goal: General experience of comfort will improve and/or be controlled Outcome: Progressing   Problem: Safety: Goal: Ability to remain free from injury will improve Outcome: Progressing   Problem: Skin Integrity: Goal: Risk for impaired skin integrity will decrease Outcome: Progressing

## 2023-06-03 NOTE — Progress Notes (Signed)
 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  Pt agreeable to therapy and was looking forward to getting up and ambulating if possible.  Pt performed transfers and looked much more stable compared to the last session.  Pt ambulated to the nurses station x2 and needed 1 standing rest break up against the wall.  Pt speaking about people coming into his room and taking items and trying to buy everything out from under me.  Pt returned to the bed due to the room not having a recliner.  Pt left with caregiver present in the room.  All needs met and call bell within reach.    If plan is discharge home, recommend the following: Two people to help with walking and/or transfers;Two people to help with bathing/dressing/bathroom;Assistance with feeding;Direct supervision/assist for financial management;Direct supervision/assist for medications management   Can travel by private vehicle        Equipment Recommendations  None recommended by PT    Recommendations for Other Services       Precautions / Restrictions Precautions Precautions: Fall Restrictions Weight Bearing Restrictions Per Provider Order: No     Mobility  Bed Mobility Overal bed mobility: Needs Assistance Bed Mobility: Supine to Sit     Supine to sit: Min assist Sit to supine: Supervision   General bed mobility comments: Pt with increased ability to perform transfers with reduced assistance needed.    Transfers Overall transfer level: Needs assistance Equipment used: Rolling walker (2 wheels) Transfers: Sit to/from Stand Sit to Stand: Min assist, From  elevated surface           General transfer comment: pt able to stand upright with minA from therapist, not needing a second person at this time.    Ambulation/Gait Ambulation/Gait assistance: Contact guard assist Gait Distance (Feet): 100 Feet Assistive device: Rolling walker (2 wheels) Gait Pattern/deviations: Step-through pattern, Decreased step length - right, Decreased step length - left, Decreased stride length, Trunk flexed Gait velocity: decreased     General Gait Details: Pt was able to ambulate to the nurses station and back to room x2, but has abnormal gait pattern, difficult to describe because it's not consistently abnormal.   Stairs             Wheelchair Mobility     Tilt Bed    Modified Rankin (Stroke Patients Only)       Balance Overall balance assessment: Needs assistance Sitting-balance support: Feet supported Sitting balance-Leahy Scale: Good     Standing balance support: Bilateral upper extremity supported Standing balance-Leahy Scale: Poor                              Cognition Arousal: Alert Behavior During Therapy: Flat affect Overall Cognitive Status: Impaired/Different from baseline Area of Impairment: Following commands, Safety/judgement, Awareness, Problem solving                     Memory: Decreased recall of precautions, Decreased short-term memory Following Commands: Follows one step commands consistently       General Comments: Pt is alert throughout session however confused and disoriented. Has  baseline congition deficits but per family, much worse currently.        Exercises      General Comments        Pertinent Vitals/Pain Pain Assessment Pain Assessment: No/denies pain Pain Intervention(s): Limited activity within patient's tolerance, Monitored during session    Home Living                          Prior Function            PT Goals (current goals can now be found in  the care plan section) Acute Rehab PT Goals Patient Stated Goal: none stated PT Goal Formulation: Patient unable to participate in goal setting Time For Goal Achievement: 05/29/23 Potential to Achieve Goals: Poor Progress towards PT goals: Progressing toward goals    Frequency    Min 1X/week      PT Plan      Co-evaluation     PT goals addressed during session: Mobility/safety with mobility;Balance;Proper use of DME;Strengthening/ROM        AM-PAC PT 6 Clicks Mobility   Outcome Measure  Help needed turning from your back to your side while in a flat bed without using bedrails?: A Little Help needed moving from lying on your back to sitting on the side of a flat bed without using bedrails?: A Little Help needed moving to and from a bed to a chair (including a wheelchair)?: A Little Help needed standing up from a chair using your arms (e.g., wheelchair or bedside chair)?: A Little Help needed to walk in hospital room?: A Lot Help needed climbing 3-5 steps with a railing? : Total 6 Click Score: 15    End of Session Equipment Utilized During Treatment: Gait belt Activity Tolerance: Patient tolerated treatment well;Other (comment) Patient left: in bed;with call bell/phone within reach;with nursing/sitter in room;with bed alarm set Nurse Communication: Mobility status PT Visit Diagnosis: Other abnormalities of gait and mobility (R26.89)     Time: 9154-9090 PT Time Calculation (min) (ACUTE ONLY): 24 min  Charges:    $Therapeutic Activity: 23-37 mins PT General Charges $$ ACUTE PT VISIT: 1 Visit                     Fonda Simpers, PT, DPT Physical Therapist - [facility]  06/03/23, 12:26 PM

## 2023-06-03 NOTE — Discharge Summary (Signed)
 Physician Discharge Summary   Patient: Justin Ali MRN: [id number]  DOB: Jun 02, 1940   Admit:     Date of Admission: 05/19/2023 Admitted from: lives independently   Discharge: Date of discharge: 06/03/23 Disposition: Skilled nursing facility Condition at discharge: good  CODE STATUS: DNR     Discharge Physician: Laneta Blunt, DO [facility]     PCP: Rilla Baller, MD  Recommendations for Outpatient Follow-up:  Follow up with PCP Rilla Baller, MD in 1-2 weeks recheck CBC, BMP Follow up with oncology in 1-2 weeks   Discharge Instructions     Diet - low sodium heart healthy   Complete by: As directed    Increase activity slowly   Complete by: As directed          Discharge Diagnoses: Principal Problem:   Syncope Active Problems:   Sinus bradycardia   Encephalopathy   Chronic kidney disease, stage 3a (HCC)   Prostate cancer metastatic to bone [facility])   Primary malignant 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 [facility])   Hypercalcemia of malignancy   Hypokalemia   Hypernatremia   AKI (acute kidney injury) [facility])      Hospital course / significant events:  Justin Ali is 83 y.o. male with prostate cancer, hypertension, sinus bradycardia, dementia, who presented after syncopal episode in his independent living facility. Found at the dining table slumped over. Blood sugar on arrival 156, blood pressure 90/50, satting 80% on room air. Denied any recent fever or chills. ER workup reveals WBC 5.2, hemoglobin 9.3, platelets 149, COVID/flu/RSV negative. Creatinine 2.65. Chest x-ray stable apart from known metastatic prostate cancer. See A/P for problem based course   Consultants:  nephrology  Procedures/Surgeries: none      ASSESSMENT & PLAN:  Acute metabolic Encephalopathy This is likely multifactorial, electrolyte disturbance + history of dementia+ hospital delirium Head CT 1/25 negative for acute intracranial  abnormalities Mental status currently improved to baseline Continue PT OT Therapist has recommended discharge to rehab   Syncope, multifactorial Orthostatic Hypotension:  Patient was positive for orthostatic hypotension Blood pressure improved    Right parotitis Swelling and warmth in right parotid gland region improved Continue Zosyn  to be transitioned to Augmentin  at discharge   Bradycardia-resolved Continue to monitor closely   Hypercalcemia, resolved now status post zoledronic  acid, IV fluids likely secondary to malignancy as patient has diffuse osteolytic lesions (redemonstrated on CXR and brain CT) Appears he has been taking calcium  supplements outpatient - stopped Alk phos within normal limits PTH low 8 Kappa lambda free light chain 26.6 which is slightly high Nephrology have signed off Outpatient follow-up with oncologist   Hypokalemia Hyperchloremia Hypernatremia Patient received D5 W Continue potassium repletion and monitoring   Dementia Continue delirium precaution   AKI superimposed on chronic kidney disease stage IIIa Baseline creatinine 1-1.4 with GFR in 40s Creatinine on arrival 2.65, improving now Monitor renal function outpatient   Metastatic prostate cancer Metastatic spread to the bones, numerous sclerotic lesions to axial and appendicular skeleton and skull Previously on Zytiga  and prednisone , appears only on Zytiga  at time of admission.  Given worsening electrolytes we will hold Zytiga  and restart on discharge  Appears patient was previously on hospice with a joint decision of his daughter. His son, Franky has recently revoked hospice and is now requesting full aggressive care. Have engaged oncology during this admission give worsening hypercalcemia PSA 1.56 Have discussed benefits of palliative care  +/- hospice again with the patient's family, they are aware  and considering all options Have also discussed recommendations to proceed with the FDA  approved medications only and to avoid additional supplements/meds including ivermectin and fenbendazole to avoid complicating his current clinical picture   overweight based on BMI: Body mass index is 25.44 kg/m.  Underweight - under 18  overweight - 25 to 29 obese - 30 or more Class 1 obesity: BMI of 30.0 to 34 Class 2 obesity: BMI of 35.0 to 39 Class 3 obesity: BMI of 40.0 to 49 Super Morbid Obesity: BMI 50-59 Super-super Morbid Obesity: BMI 60+ Significantly low or high BMI is associated with higher medical risk.  Weight management advised as adjunct to other disease management and risk reduction treatments          Discharge Instructions  Allergies as of 06/03/2023   No Known Allergies      Medication List     TAKE these medications    abiraterone  acetate 250 MG tablet Commonly known as: ZYTIGA  Take 1,000 mg by mouth every other day. Take on an empty stomach 1 hour before or 2 hours after a meal   amLODipine  5 MG tablet Commonly known as: NORVASC  Take 1 tablet (5 mg total) by mouth daily. Start taking on: June 04, 2023   amoxicillin -clavulanate 875-125 MG tablet Commonly known as: AUGMENTIN  Take 1 tablet by mouth 2 (two) times daily for 3 days.   feeding supplement Liqd Take 237 mLs by mouth 2 (two) times daily between meals.   Fish Oil  1000 MG Caps Take 1,000 mg by mouth daily.   furosemide  20 MG tablet Commonly known as: LASIX  Take 1 tablet (20 mg total) by mouth daily as needed for edema. For leg swelling What changed:  when to take this reasons to take this   melatonin 5 MG Tabs Take 10 mg by mouth at bedtime.   omeprazole  20 MG capsule Commonly known as: PRILOSEC Take 1 capsule (20 mg total) by mouth daily.   ondansetron  4 MG tablet Commonly known as: ZOFRAN  Take 1 tablet (4 mg total) by mouth every 6 (six) hours as needed for nausea.   polyethylene glycol powder 17 GM/SCOOP powder Commonly known as: GLYCOLAX /MIRALAX  Take 8.5 g by  mouth daily as needed for moderate constipation.   potassium chloride  10 MEQ tablet Commonly known as: KLOR-CON  M Take 1 tablet (10 mEq total) by mouth daily.   QUEtiapine  25 MG tablet Commonly known as: SEROQUEL  Take 25 mg by mouth at bedtime.   traMADol  50 MG tablet Commonly known as: ULTRAM  Take 1 tablet (50 mg total) by mouth every 12 (twelve) hours as needed for moderate pain (pain score 4-6).   vitamin D3 50 MCG (2000 UT) Caps Take 2,000 Units by mouth every other day.         Contact information for follow-up providers     Rilla Baller, MD Follow up.   Specialty: Family Medicine Why: Hospital follow up Contact information: [address] [place] [address] [phone]              Contact information for after-discharge care     Destination     [facility] Preferred SNF .   Service: Skilled Nursing Contact information: [address] [place]  [address] [phone]                     No Known Allergies   Subjective: pt reports feeling well this morning, no pain, no fever/chills, no abd pain  and tolerating diet    Discharge Exam: BP (!) 104/59 (BP Location: Left Arm)   Pulse 73   Temp 98.7 F (37.1 C)   Resp 18   Ht 5' 6 (1.676 m)   Wt 71.5 kg   SpO2 100%   BMI 25.44 kg/m   General: Pt is alert, awake, not in acute distress Cardiovascular: RRR, S1/S2 +, no rubs, no gallops Respiratory: CTA bilaterally, no wheezing, no rhonchi Abdominal: Soft, NT, ND, bowel sounds + Extremities: no edema, no cyanosis     The results of significant diagnostics from this hospitalization (including imaging, microbiology, ancillary and laboratory) are listed below for reference.     Microbiology: No results found for this or any previous visit (from the past 240 hours).   Labs: BNP (last 3 results) No results for input(s): BNP in the last 8760 hours. Basic Metabolic  Panel: Recent Labs  Lab 05/30/23 0526 05/31/23 0547 06/01/23 0552 06/02/23 0628 06/03/23 0604  NA 147* 147* 146* 142 142  K 2.9* 2.8* 3.2* 3.3* 3.7  CL 114* 115* 115* 113* 115*  CO2 20* 20* 21* 20* 19*  GLUCOSE 105* 129* 104* 98 93  BUN 35* 34* 28* 20 17  CREATININE 2.10* 2.03* 1.73* 1.43* 1.42*  CALCIUM  6.8* 6.9* 7.0* 7.5* 8.7*  MG  --  1.9  --   --  1.7   Liver Function Tests: No results for input(s): AST, ALT, ALKPHOS, BILITOT, PROT, ALBUMIN in the last 168 hours. No results for input(s): LIPASE, AMYLASE in the last 168 hours. No results for input(s): AMMONIA in the last 168 hours. CBC: Recent Labs  Lab 05/30/23 0526 05/31/23 0547 06/01/23 0552 06/02/23 0628 06/03/23 0604  WBC 8.7 8.9 8.3 7.7 7.8  NEUTROABS 6.7 6.2 5.8 5.3 5.3  HGB 10.2* 9.5* 9.0* 8.7* 9.3*  HCT 29.5* 28.1* 27.2* 26.2* 27.9*  MCV 89.7 90.1 91.9 91.6 90.6  PLT 261 275 271 279 322   Cardiac Enzymes: No results for input(s): CKTOTAL, CKMB, CKMBINDEX, TROPONINI in the last 168 hours. BNP: Invalid input(s): POCBNP CBG: Recent Labs  Lab 05/28/23 2104 05/29/23 0501 05/30/23 0521 05/31/23 0456 06/03/23 0547  GLUCAP 113* 107* 101* 107* 74   D-Dimer No results for input(s): DDIMER in the last 72 hours. Hgb A1c No results for input(s): HGBA1C in the last 72 hours. Lipid Profile No results for input(s): CHOL, HDL, LDLCALC, TRIG, CHOLHDL, LDLDIRECT in the last 72 hours. Thyroid  function studies No results for input(s): TSH, T4TOTAL, T3FREE, THYROIDAB in the last 72 hours.  Invalid input(s): FREET3 Anemia work up No results for input(s): VITAMINB12, FOLATE, FERRITIN, TIBC, IRON, RETICCTPCT in the last 72 hours. Urinalysis    Component Value Date/Time   COLORURINE YELLOW (A) 11/20/2022 1359   APPEARANCEUR CLEAR (A) 11/20/2022 1359   APPEARANCEUR Clear 09/18/2016 0916   LABSPEC 1.008 11/20/2022 1359   LABSPEC 1.031 02/14/2013  1857   PHURINE 7.0 11/20/2022 1359   GLUCOSEU NEGATIVE 11/20/2022 1359   GLUCOSEU Negative 02/14/2013 1857   HGBUR NEGATIVE 11/20/2022 1359   BILIRUBINUR NEGATIVE 11/20/2022 1359   BILIRUBINUR 1+ 10/10/2022 1217   BILIRUBINUR Negative 09/18/2016 0916   BILIRUBINUR 1+ 02/14/2013 1857   KETONESUR NEGATIVE 11/20/2022 1359   PROTEINUR NEGATIVE 11/20/2022 1359   UROBILINOGEN 0.2 10/10/2022 1217   NITRITE NEGATIVE 11/20/2022 1359   LEUKOCYTESUR NEGATIVE 11/20/2022 1359   LEUKOCYTESUR Negative 02/14/2013 1857   Sepsis Labs Recent Labs  Lab 05/31/23 0547 06/01/23 0552 06/02/23 0628 06/03/23 0604  WBC 8.9 8.3  7.7 7.8   Microbiology No results found for this or any previous visit (from the past 240 hours). Imaging ECHOCARDIOGRAM COMPLETE Result Date: 05/20/2023    ECHOCARDIOGRAM REPORT   Patient Name:   JOSEFF LUCKMAN Date of Exam: 05/20/2023 Medical Rec #:  [id number]        Height:       66.0 in Accession #:    [id number]       Weight:       163.1 lb Date of Birth:  04-30-1940        BSA:          1.834 m Patient Age:    82 years         BP:           140/67 mmHg Patient Gender: M                HR:           60 bpm. Exam Location:  [facility] Procedure: 2D Echo, Cardiac Doppler and Color Doppler Indications:     Syncope  History:         Patient has no prior history of Echocardiogram examinations.                  CAD, Arrythmias:Bradycardia, Signs/Symptoms:Syncope; Risk                  Factors:Hypertension, Dyslipidemia and Sleep Apnea. CKD.  Sonographer:     Naomie Reef Referring Phys:  [phone] ELSPETH PARAS NEWTON Diagnosing Phys: Evalene Lunger MD  Sonographer Comments: Technically difficult study due to poor echo windows. Image acquisition challenging due to respiratory motion. IMPRESSIONS  1. Left ventricular ejection fraction, by estimation, is 55 to 60%. The left ventricle has normal function. The left ventricle has no regional wall motion abnormalities. Left ventricular diastolic parameters  are indeterminate.  2. Right ventricular systolic function is normal. The right ventricular size is normal. There is normal pulmonary artery systolic pressure. The estimated right ventricular systolic pressure is 33.5 mmHg.  3. Left atrial size was mildly dilated.  4. The mitral valve is normal in structure. Mild mitral valve regurgitation. No evidence of mitral stenosis.  5. The aortic valve is normal in structure. There is moderate calcification of the aortic valve. Aortic valve regurgitation is not visualized. Aortic valve sclerosis/calcification is present, without any evidence of aortic stenosis. Aortic valve mean gradient measures 7.7 mmHg.  6. The inferior vena cava is normal in size with greater than 50% respiratory variability, suggesting right atrial pressure of 3 mmHg. FINDINGS  Left Ventricle: Left ventricular ejection fraction, by estimation, is 55 to 60%. The left ventricle has normal function. The left ventricle has no regional wall motion abnormalities. The left ventricular internal cavity size was normal in size. There is  no left ventricular hypertrophy. Left ventricular diastolic parameters are indeterminate. Right Ventricle: The right ventricular size is normal. No increase in right ventricular wall thickness. Right ventricular systolic function is normal. There is normal pulmonary artery systolic pressure. The tricuspid regurgitant velocity is 2.76 m/s, and  with an assumed right atrial pressure of 3 mmHg, the estimated right ventricular systolic pressure is 33.5 mmHg. Left Atrium: Left atrial size was mildly dilated. Right Atrium: Right atrial size was normal in size. Pericardium: There is no evidence of pericardial effusion. Mitral Valve: The mitral valve is normal in structure. Mild mitral valve regurgitation. No evidence of mitral valve stenosis. MV peak gradient, 5.2 mmHg. The mean mitral  valve gradient is 2.0 mmHg. Tricuspid Valve: The tricuspid valve is normal in structure. Tricuspid  valve regurgitation is mild . No evidence of tricuspid stenosis. Aortic Valve: The aortic valve is normal in structure. There is moderate calcification of the aortic valve. Aortic valve regurgitation is not visualized. Aortic valve sclerosis/calcification is present, without any evidence of aortic stenosis. Aortic valve mean gradient measures 7.7 mmHg. Aortic valve peak gradient measures 16.2 mmHg. Aortic valve area, by VTI measures 1.96 cm. Pulmonic Valve: The pulmonic valve was normal in structure. Pulmonic valve regurgitation is not visualized. No evidence of pulmonic stenosis. Aorta: The aortic root is normal in size and structure. Venous: The inferior vena cava is normal in size with greater than 50% respiratory variability, suggesting right atrial pressure of 3 mmHg. IAS/Shunts: No atrial level shunt detected by color flow Doppler.  LEFT VENTRICLE PLAX 2D LVIDd:         4.50 cm   Diastology LVIDs:         3.00 cm   LV e' medial:   7.83 cm/s LV PW:         1.20 cm   LV E/e' medial: 12.5 LV IVS:        1.30 cm LVOT diam:     2.10 cm LV SV:         81 LV SV Index:   44 LVOT Area:     3.46 cm  RIGHT VENTRICLE RV Basal diam:  3.45 cm RV Mid diam:    3.20 cm RV S prime:     16.90 cm/s TAPSE (M-mode): 3.0 cm LEFT ATRIUM             Index        RIGHT ATRIUM           Index LA diam:        3.40 cm 1.85 cm/m   RA Area:     18.40 cm LA Vol (A2C):   70.4 ml 38.39 ml/m  RA Volume:   48.50 ml  26.45 ml/m LA Vol (A4C):   50.5 ml 27.54 ml/m LA Biplane Vol: 64.0 ml 34.90 ml/m  AORTIC VALVE                     PULMONIC VALVE AV Area (Vmax):    1.77 cm      PV Vmax:       0.94 m/s AV Area (Vmean):   1.88 cm      PV Peak grad:  3.5 mmHg AV Area (VTI):     1.96 cm AV Vmax:           201.33 cm/s AV Vmean:          121.667 cm/s AV VTI:            0.415 m AV Peak Grad:      16.2 mmHg AV Mean Grad:      7.7 mmHg LVOT Vmax:         103.00 cm/s LVOT Vmean:        66.100 cm/s LVOT VTI:          0.235 m LVOT/AV VTI ratio: 0.57   AORTA Ao Root diam: 3.50 cm MITRAL VALVE                TRICUSPID VALVE MV Area (PHT): 6.12 cm     TR Peak grad:   30.5 mmHg MV Area VTI:   1.85 cm     TR  Vmax:        276.00 cm/s MV Peak grad:  5.2 mmHg MV Mean grad:  2.0 mmHg     SHUNTS MV Vmax:       1.14 m/s     Systemic VTI:  0.24 m MV Vmean:      58.4 cm/s    Systemic Diam: 2.10 cm MV Decel Time: 124 msec MV E velocity: 97.70 cm/s MV A velocity: 106.00 cm/s MV E/A ratio:  0.92 Evalene Lunger MD Electronically signed by Evalene Lunger MD Signature Date/Time: 05/20/2023/10:50:03 AM    Final       Time coordinating discharge: over 30 minutes  SIGNED:  Laneta Blunt DO [facility]

## 2023-06-04 ENCOUNTER — Telehealth: Payer: Self-pay | Admitting: Oncology

## 2023-06-04 NOTE — Telephone Encounter (Signed)
 [facility] called and wanted to schedule an appointment here. It looks like he last saw Dr. Georgina in march of 2024 and it said no follow up necessary. I wasn't sure what to do so I said I would get the message to Dr. Claressa team. The person I spoke to at [facility] wanted to [facility] to [facility] nurse. Her name is Olam and her number is [phone]

## 2023-06-04 NOTE — Telephone Encounter (Signed)
 Clotilda at [facility] called and asked for a follow up appointment for this patient. She states he was admitted to the facility yesterday and needs a follow up appointment.   Please advise scheduling needs-   Please call Tiffany or Luke at [phone] with appointments.

## 2023-06-08 ENCOUNTER — Telehealth: Payer: Self-pay | Admitting: *Deleted

## 2023-06-08 DIAGNOSIS — I951 Orthostatic hypotension: Secondary | ICD-10-CM | POA: Diagnosis not present

## 2023-06-08 DIAGNOSIS — N179 Acute kidney failure, unspecified: Secondary | ICD-10-CM | POA: Diagnosis not present

## 2023-06-09 ENCOUNTER — Encounter: Payer: Self-pay | Admitting: Oncology

## 2023-06-09 ENCOUNTER — Inpatient Hospital Stay: Payer: Medicare Other | Admitting: Oncology

## 2023-06-09 NOTE — Patient Instructions (Signed)
error 

## 2023-06-09 NOTE — Telephone Encounter (Signed)
Pt daughter in law, Revonda Standard, called in regards to the appt that was canceled. She stated she wanted to know why appt was canceled and why they were not made aware. I saw this phone note and read it to her. She stated she is going to call [facility] now to follow up and that she understands the reasoning and that pt appts will now be with the [place].

## 2023-06-09 NOTE — Telephone Encounter (Signed)
RN placed call to [facility] to make facility aware that patients appointment for 2/11 has been cancelled. Per Dr. Orlie Dakin patient has been discharged from clinic and should receive any further care from the [facility] as this is where his care was transferred. [facility] made aware that patient will not be rescheduled here at the [facility]. [facility] verbalized understanding.

## 2023-06-09 NOTE — Progress Notes (Unsigned)
error 

## 2023-06-11 DIAGNOSIS — I10 Essential (primary) hypertension: Secondary | ICD-10-CM | POA: Diagnosis not present

## 2023-06-12 ENCOUNTER — Emergency Department
Admission: EM | Admit: 2023-06-12 | Discharge: 2023-06-25 | Disposition: A | Discharge status: Skilled Nursing Facility | Payer: No Typology Code available for payment source | Attending: Emergency Medicine | Admitting: Emergency Medicine

## 2023-06-12 ENCOUNTER — Emergency Department: Payer: No Typology Code available for payment source

## 2023-06-12 ENCOUNTER — Other Ambulatory Visit: Payer: Self-pay

## 2023-06-12 ENCOUNTER — Encounter: Payer: Self-pay | Admitting: Emergency Medicine

## 2023-06-12 DIAGNOSIS — C61 Malignant neoplasm of prostate: Secondary | ICD-10-CM

## 2023-06-12 DIAGNOSIS — I10 Essential (primary) hypertension: Secondary | ICD-10-CM | POA: Insufficient documentation

## 2023-06-12 DIAGNOSIS — F0390 Unspecified dementia without behavioral disturbance: Secondary | ICD-10-CM | POA: Diagnosis not present

## 2023-06-12 DIAGNOSIS — S0990XA Unspecified injury of head, initial encounter: Secondary | ICD-10-CM

## 2023-06-12 DIAGNOSIS — R197 Diarrhea, unspecified: Secondary | ICD-10-CM

## 2023-06-12 DIAGNOSIS — S61511A Laceration without foreign body of right wrist, initial encounter: Secondary | ICD-10-CM

## 2023-06-12 DIAGNOSIS — S0101XA Laceration without foreign body of scalp, initial encounter: Secondary | ICD-10-CM

## 2023-06-12 DIAGNOSIS — A041 Enterotoxigenic Escherichia coli infection: Secondary | ICD-10-CM

## 2023-06-12 LAB — COMPREHENSIVE METABOLIC PANEL
ALT: 12 U/L (ref 0–44)
AST: 16 U/L (ref 15–41)
Albumin: 2.9 g/dL — ABNORMAL LOW (ref 3.5–5.0)
Alkaline Phosphatase: 80 U/L (ref 38–126)
Anion gap: 9 (ref 5–15)
BUN: 11 mg/dL (ref 8–23)
CO2: 21 mmol/L — ABNORMAL LOW (ref 22–32)
Calcium: 8.8 mg/dL — ABNORMAL LOW (ref 8.9–10.3)
Chloride: 112 mmol/L — ABNORMAL HIGH (ref 98–111)
Creatinine, Ser: 1.47 mg/dL — ABNORMAL HIGH (ref 0.61–1.24)
GFR, Estimated: 48 mL/min — ABNORMAL LOW (ref >60)
Glucose, Bld: 105 mg/dL — ABNORMAL HIGH (ref 70–99)
Potassium: 3.3 mmol/L — ABNORMAL LOW (ref 3.5–5.1)
Sodium: 142 mmol/L (ref 135–145)
Total Bilirubin: 0.8 mg/dL (ref 0.0–1.2)
Total Protein: 5.8 g/dL — ABNORMAL LOW (ref 6.5–8.1)

## 2023-06-12 LAB — CBC
HCT: 28.5 % — ABNORMAL LOW (ref 39.0–52.0)
Hemoglobin: 9.6 g/dL — ABNORMAL LOW (ref 13.0–17.0)
MCH: 30.8 pg (ref 26.0–34.0)
MCHC: 33.7 g/dL (ref 30.0–36.0)
MCV: 91.3 fL (ref 80.0–100.0)
Platelets: 323 10*3/uL (ref 150–400)
RBC: 3.12 MIL/uL — ABNORMAL LOW (ref 4.22–5.81)
RDW: 13.9 % (ref 11.5–15.5)
WBC: 6.6 10*3/uL (ref 4.0–10.5)
nRBC: 0 % (ref 0.0–0.2)

## 2023-06-12 NOTE — NC FL2 (Signed)
[place] MEDICAID FL2 LEVEL OF CARE FORM     IDENTIFICATION  Patient Name: Justin Ali Birthdate: 1940-05-15 Sex: male Admission Date (Current Location): 06/12/2023  [facility] and [place] Number:  Chiropodist and Address:  [facility], [address], [place], [place] [address]      Provider Number: [id number]  Attending Physician Name and Address:  No att. providers found  Relative Name and Phone Number:       Current Level of Care: Hospital Recommended Level of Care: Skilled Nursing Facility Prior Approval Number:    Date Approved/Denied:   PASRR Number: [id number]  Discharge Plan: Home    Current Diagnoses: Patient Active Problem List   Diagnosis Date Noted   Hypercalcemia of malignancy 05/22/2023   Hypokalemia 05/22/2023   Hypernatremia 05/22/2023   AKI (acute kidney injury) (HCC) 05/22/2023   Syncope 05/19/2023   Encephalopathy 05/19/2023   Sinus bradycardia 05/19/2023   Chronic pain syndrome 12/21/2022   Sore on leg 11/05/2022   Pedal edema 10/13/2022   Constipation 10/13/2022   Positive RPR test 08/22/2022   Cognitive and behavioral changes 03/18/2022   Memory deficit 03/15/2022   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   History of GI bleed 01/02/2022   Chronic kidney disease, stage 3a (HCC)    Prostate cancer metastatic to bone [facility])    History of gastric ulcer    Low serum vitamin B12 07/10/2021   Bilateral flank pain 11/21/2020   Tinea cruris 07/13/2020   Choroidal lesion 08/19/2019   Combined forms of age-related cataract of both eyes 08/19/2019   DNR (do not resuscitate) 07/04/2019   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   Metastasis from malignant neoplasm of prostate (HCC) 06/24/2019   Nuclear sclerosis of both eyes 06/24/2019   Laxity of eyelid 01/16/2018   Chronic insomnia 01/02/2017   Primary malignant neoplasm of prostate  metastatic to bone (HCC) 09/24/2016   Benign prostatic hyperplasia 06/12/2015   Advanced care planning/counseling discussion 06/07/2014   Unintentional weight loss 06/07/2014   Hearing loss due to cerumen impaction 06/07/2014   CAD (coronary artery disease), native coronary artery 06/02/2013   Angiodysplasia of cecum 03/18/2013   History of colonic polyps 08/05/2012   Medicare annual wellness visit, subsequent 06/02/2012   Health maintenance examination 06/02/2011   Fatty liver 06/02/2011   HYPERTENSION, BENIGN ESSENTIAL 11/02/2006   Dyslipidemia 10/27/2006   RESTLESS LEG SYNDROME 10/27/2006   Hereditary and idiopathic peripheral neuropathy 10/27/2006   Sleep apnea 10/27/2006    Orientation RESPIRATION BLADDER Height & Weight     Self  Normal Incontinent Weight: 160 lb (72.6 kg) Height:  5\' 6"  (167.6 cm)  BEHAVIORAL SYMPTOMS/MOOD NEUROLOGICAL BOWEL NUTRITION STATUS      Continent Diet (See discharge summary)  AMBULATORY STATUS COMMUNICATION OF NEEDS Skin   Extensive Assist Verbally Normal                       Personal Care Assistance Level of Assistance  Bathing, Feeding, Dressing Bathing Assistance: Maximum assistance Feeding assistance: Limited assistance Dressing Assistance: Maximum assistance     Functional Limitations Info  Hearing, Speech, Sight Sight Info: Adequate Hearing Info: Impaired Speech Info: Impaired    SPECIAL CARE FACTORS FREQUENCY  PT (By licensed PT), OT (By licensed OT)     PT Frequency: 5x weekly OT Frequency: 5x weekly  Contractures Contractures Info: Not present    Additional Factors Info  Code Status, Allergies Code Status Info: DNR Allergies Info: No known allergies           Current Medications (06/12/2023):  This is the current hospital active medication list No current facility-administered medications for this encounter.   Current Outpatient Medications  Medication Sig Dispense Refill   abiraterone  acetate (ZYTIGA) 250 MG tablet Take 1,000 mg by mouth every other day. Take on an empty stomach 1 hour before or 2 hours after a meal     amLODipine (NORVASC) 5 MG tablet Take 1 tablet (5 mg total) by mouth daily.     Cholecalciferol (VITAMIN D3) 50 MCG (2000 UT) CAPS Take 2,000 Units by mouth every other day.     feeding supplement (ENSURE ENLIVE / ENSURE PLUS) LIQD Take 237 mLs by mouth 2 (two) times daily between meals.     furosemide (LASIX) 20 MG tablet Take 1 tablet (20 mg total) by mouth daily as needed for edema. For leg swelling     melatonin 5 MG TABS Take 10 mg by mouth at bedtime.     Omega-3 Fatty Acids (FISH OIL) 1000 MG CAPS Take 1,000 mg by mouth daily.     omeprazole (PRILOSEC) 20 MG capsule Take 1 capsule (20 mg total) by mouth daily. 90 capsule 2   ondansetron (ZOFRAN) 4 MG tablet Take 1 tablet (4 mg total) by mouth every 6 (six) hours as needed for nausea.     polyethylene glycol powder (GLYCOLAX/MIRALAX) 17 GM/SCOOP powder Take 8.5 g by mouth daily as needed for moderate constipation.     potassium chloride SA (KLOR-CON M) 10 MEQ tablet Take 1 tablet (10 mEq total) by mouth daily. 90 tablet 3   QUEtiapine (SEROQUEL) 25 MG tablet Take 25 mg by mouth at bedtime.     traMADol (ULTRAM) 50 MG tablet Take 1 tablet (50 mg total) by mouth every 12 (twelve) hours as needed for moderate pain (pain score 4-6). 10 tablet 0     Discharge Medications: Please see discharge summary for a list of discharge medications.  Relevant Imaging Results:  Relevant Lab Results:   Additional Information SSN: [id number]  Inis Sizer, LCSW

## 2023-06-12 NOTE — Discharge Instructions (Addendum)
Please keep your wound clean by washing at least daily with soap and water. If you see any signs of infection like spreading redness, pus coming from the wound, extreme pain, fevers, chills or any other worsening doctor right away or come back to the emergency department  You had 1 staple to the back of your head to help repair small laceration.  See your doctor return to the emergency department in 7 to 10 days to get the staple removed.  Take Tylenol 650 mg every 6 hours as needed for pain.  I made a referral to a cancer doctor to talk to you about the bony lesions found on your CT scan of the represent spreading cancer.  Thank you for choosing [place] for your health care today!  Please see your primary doctor this week for a follow up appointment.   If you have any new, worsening, or unexpected symptoms call your doctor right away or come back to the emergency department for reevaluation.  It was my pleasure to care for you today.   Daneil Dan Modesto Charon, MD
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
## 2023-06-12 NOTE — ED Notes (Signed)
Family called for update on patient status/plan of care. Provided with update and all questions answered.

## 2023-06-12 NOTE — Progress Notes (Addendum)
3:30pm: CSW spoke with patient's daughter in law Revonda Standard and son to discuss PT recommendation for SNF. Revonda Standard [phone]) states she has spoken with Toniann Fail at [facility] regarding placement into the facility.   Once PT evaluation is available, insurance authorization needs to be started.  2:50pm: Patient has not been evaluated by PT yet for recommendations - PT order is in.  Edwin Dada, MSW, LCSW Transitions of Care  Clinical Social Worker II [phone]

## 2023-06-12 NOTE — ED Notes (Signed)
Pt's family reports they spoke to someone and [facility] can take the Pt if they can get an Authorization Number.  CSW made aware.
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
## 2023-06-12 NOTE — ED Notes (Signed)
Patient cleaned from urine and bowel incontinence. Belongings placed in patient bag. Clean brief, chucks, and gown placed. Warm blankets provided. Fall alarm activated. CB in reach and pt educated.

## 2023-06-12 NOTE — ED Notes (Signed)
Patient provided with warm blanket and plan of care updated. Also offered meal to patient. Patient denied meal at this time.

## 2023-06-13 MED ORDER — LORAZEPAM 2 MG/ML IJ SOLN
1.0000 mg | Freq: Once | INTRAMUSCULAR | Status: AC
  Administered 2023-06-13: 1 mg via INTRAMUSCULAR
  Filled 2023-06-13: qty 1

## 2023-06-13 MED ORDER — LORAZEPAM 1 MG PO TABS
1.0000 mg | ORAL_TABLET | Freq: Once | ORAL | Status: AC
  Administered 2023-06-13: 1 mg via ORAL
  Filled 2023-06-13: qty 1

## 2023-06-13 NOTE — ED Notes (Signed)
A sitter is at bedside at this time

## 2023-06-13 NOTE — ED Notes (Signed)
Pts son and daughter in law called for updates, informed family I dont have any updates but will message the social work and have them give her an update

## 2023-06-13 NOTE — Progress Notes (Signed)
CSW spoke with Toniann Fail in admissions at [facility]. CSW asked if she has reviewed patient in the hub. Toniann Fail stated she did speak with the family but doesn't have remote access to review patients referral this weekend. Toniann Fail stated she will not be able to give an answer until Monday. CSW sent out additional SNF referrals.    CSW spoke with patients son [name] and daughter in law Belle Rose. CSW told the family that there are 3 SNF offers at this time [facility], [facility], and [facility]. Patients family states that patient needs memory care and is not sure a regular SNF unit will work for the patient. Patients family stated they would like [facility] because they know there is a locked memory care unit. CSW told the family that admissions will not be able to review patient until Monday. CSW also told the family she sent referrals to [facility] and [facility] who have a dementia unit. CSW asked the family if they have medicaid to cover the cost for long term care if patients medicare approves  a SNF stay. CSW was told that patient is medicaid pending and patients income is $3900 monthly. CSW asked patients family what is their backup plan in the event patients income is too high to qualify for medicaid. Patients family stated they have a lawyer and know they might have to spend down some of patients money. Patients son asked CSW if she can just send referrals out for memory care. CSW explained that patients [employer] medicare will not pay for memory care. CSW explained that since patient has no medicaid at this time then they would have to pay privately for memory care. CSW stated memory care can range from 4,000-10,000 monthly. Patients family states they cannot afford to pay out of pocket. Patients family would like to see if patient can get into a nursing facility. TOC will continue to follow.

## 2023-06-13 NOTE — ED Notes (Signed)
Patient brief changed and chucks. Patient disoriented. Patient reoriented to situation, time, place. Lights on and TV turned on. CB on rail and educated on use. Will continue to monitor.

## 2023-06-13 NOTE — ED Notes (Signed)
Pts bed and brief were wet. Walked Pt with assistance to bathroom to change. Unsteady gait noted. Pt was changed into new gown and brief, bedding changed, new chucks placed. Pt walked back to bed with assistance and an eye cover was given to assist in patient resting at this time. Pt is calm and pleasant

## 2023-06-13 NOTE — ED Notes (Signed)
Pt continues to attempt to get out of bed.  Bed alarm is on for safety.  Pt reoriented and TV turned on for distraction.

## 2023-06-13 NOTE — ED Provider Notes (Signed)
-----------------------------------------   7:24 AM on 06/13/2023 -----------------------------------------   Blood pressure 125/72, pulse (!) 55, temperature 98.2 F (36.8 C), temperature source Oral, resp. rate 14, height 5\' 6"  (1.676 m), weight 72.6 kg, SpO2 95%.  The patient is calm and cooperative at this time.  There have been no acute events since the last update.  Awaiting disposition plan from case management/social work.    Anacaren Kohan, Layla Maw, DO 06/13/23 [phone]

## 2023-06-13 NOTE — ED Notes (Signed)
Social work replied back via [employer] and stated she will contact family today
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
## 2023-06-13 NOTE — ED Notes (Signed)
Pt attempting to get off end of stretcher and ambulate independently.  NT attempting to redirect at this time.

## 2023-06-13 NOTE — ED Notes (Signed)
Pt stating that he will "jump up and run" when this RN is not looking.  1:1 sitter remains with pt.
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
## 2023-06-13 NOTE — ED Notes (Signed)
Pt is eating at this time.

## 2023-06-14 MED ORDER — MELATONIN 5 MG PO TABS
10.0000 mg | ORAL_TABLET | Freq: Every day | ORAL | Status: DC
Start: 2023-06-14 — End: 2023-06-25
  Administered 2023-06-14 – 2023-06-24 (×11): 10 mg via ORAL
  Filled 2023-06-14 (×12): qty 2

## 2023-06-14 MED ORDER — FUROSEMIDE 40 MG PO TABS: 20.0000 mg | ORAL_TABLET | Freq: Every day | ORAL | Status: DC | PRN

## 2023-06-14 MED ORDER — OMEGA-3-ACID ETHYL ESTERS 1 G PO CAPS
1.0000 g | ORAL_CAPSULE | Freq: Every day | ORAL | Status: DC
  Administered 2023-06-14 – 2023-06-25 (×12): 1 g via ORAL
  Filled 2023-06-14 (×12): qty 1

## 2023-06-14 MED ORDER — POTASSIUM CHLORIDE CRYS ER 20 MEQ PO TBCR
20.0000 meq | EXTENDED_RELEASE_TABLET | Freq: Every day | ORAL | Status: DC
  Administered 2023-06-14 – 2023-06-25 (×12): 20 meq via ORAL
  Filled 2023-06-14 (×12): qty 1

## 2023-06-14 MED ORDER — PANTOPRAZOLE SODIUM 40 MG PO TBEC
40.0000 mg | DELAYED_RELEASE_TABLET | Freq: Every day | ORAL | Status: DC
  Administered 2023-06-14 – 2023-06-25 (×12): 40 mg via ORAL
  Filled 2023-06-14 (×12): qty 1

## 2023-06-14 MED ORDER — POLYETHYLENE GLYCOL 3350 17 G PO PACK
17.0000 g | PACK | Freq: Every day | ORAL | Status: DC | PRN
  Administered 2023-06-15: 17 g via ORAL
  Filled 2023-06-14: qty 1

## 2023-06-14 MED ORDER — ONDANSETRON HCL 4 MG PO TABS: 4.0000 mg | ORAL_TABLET | Freq: Four times a day (QID) | ORAL | Status: DC | PRN

## 2023-06-14 MED ORDER — AMLODIPINE BESYLATE 5 MG PO TABS
5.0000 mg | ORAL_TABLET | Freq: Every day | ORAL | Status: DC
  Administered 2023-06-14 – 2023-06-25 (×8): 5 mg via ORAL
  Filled 2023-06-14 (×9): qty 1

## 2023-06-14 MED ORDER — QUETIAPINE FUMARATE 25 MG PO TABS
25.0000 mg | ORAL_TABLET | Freq: Every day | ORAL | Status: DC
  Administered 2023-06-14 – 2023-06-24 (×11): 25 mg via ORAL
  Filled 2023-06-14 (×11): qty 1

## 2023-06-14 MED ORDER — ABIRATERONE ACETATE 250 MG PO TABS: 1000.0000 mg | ORAL_TABLET | ORAL | Status: DC

## 2023-06-14 MED ORDER — ENSURE ENLIVE PO LIQD
237.0000 mL | Freq: Two times a day (BID) | ORAL | Status: DC
  Administered 2023-06-15 – 2023-06-24 (×15): 237 mL via ORAL

## 2023-06-14 NOTE — ED Notes (Signed)
Gave patient apple juice and saltine crackers. Patient drinking and eating.

## 2023-06-14 NOTE — ED Notes (Signed)
Patient's breakfast at bedside. RN asked patient if they would like to eat, patient responded "I think you could give it to someone else". RN left tray at bedside, Patient went back to rest in bed.

## 2023-06-14 NOTE — ED Notes (Signed)
Skin tears x3 noted on bilateral hands covered with dry gauze and tape.  Rt hand 1 skin tear, non bleeding, cleansed and steri strips applied.  Left hand skin tears non bleeding x 2 noted, both cleansed, steri strips applied to one, petroleum gauze and large bandaid applied to other d/t no skin flap present.

## 2023-06-14 NOTE — ED Notes (Signed)
 Pt given snack.

## 2023-06-14 NOTE — ED Notes (Signed)
Due 1000 "Zytiga" not verified by pharmacy.

## 2023-06-14 NOTE — ED Provider Notes (Signed)
-----------------------------------------   6:22 AM on 06/14/2023 -----------------------------------------   Blood pressure 108/75, pulse 85, temperature 98.7 F (37.1 C), temperature source Oral, resp. rate 16, height 5\' 6"  (1.676 m), weight 72.6 kg, SpO2 95%.  The patient is calm and cooperative at this time.  There have been no acute events since the last update.  Awaiting disposition plan from case management/social work.    Asna Muldrow, Layla Maw, DO 06/14/23 [phone]

## 2023-06-14 NOTE — ED Notes (Signed)
Offered patient lunch. Patient refused, falling back to sleep in bed. RR even and unlabored.

## 2023-06-14 NOTE — ED Notes (Addendum)
Patient seen pulling off steri strips. RN attempted to tell patient not to pull them off. Patietn continued to pull off steri strips.

## 2023-06-15 NOTE — ED Notes (Signed)
Patient complains of mild constipation

## 2023-06-15 NOTE — ED Notes (Signed)
Called dietary to check the status of patient's ensure. This RN told that the Ensure is on the way up to the patient now.

## 2023-06-15 NOTE — ED Notes (Signed)
Patient had loose stool @8 :15. Bed linen, Gown and Brief changed and patient cleaned up.

## 2023-06-15 NOTE — ED Notes (Signed)
Pts son at the bedside.

## 2023-06-15 NOTE — ED Notes (Signed)
Patient given a coke.

## 2023-06-15 NOTE — TOC Progression Note (Addendum)
Transition of Care [facility]) - Progression Note    Patient Details  Name: Justin Ali MRN: [id number] Date of Birth: 1941/04/15  Transition of Care [facility]) CM/SW Contact  Erin Sons, [place] Phone Number: 06/15/2023, 4:24 PM  Clinical Narrative:     CSW spoke with [facility] and answered questions about meds, treatments, and LTC needs. They would be able to offer bed for pt to admit under rehab and transition to LTC though would need 30 days private pay up front for LTC at 288$/day. CSW spoke with pt's DIL Revonda Standard who confirmed family can manage this. CSW requested that [employer] business office manager contact DIL to discuss payment. CSW initiated SNF auth. [name] is pending. Auth ID [id number]    Barriers to Discharge: Other (must enter comment) (waiting on PT to see for recommendation on placement , if SNF is recommended . Family wants another SNF other than [facility])  Expected Discharge Plan and Services                                               Social Determinants of Health (SDOH) Interventions SDOH Screenings   Food Insecurity: No Food Insecurity (05/19/2023)  Housing: Low Risk  (05/19/2023)  Transportation Needs: No Transportation Needs (05/19/2023)  Utilities: Not At Risk (05/19/2023)  Alcohol Screen: Low Risk  (07/16/2022)  Depression (PHQ2-9): Low Risk  (03/13/2023)  Recent Concern: Depression (PHQ2-9) - High Risk (01/16/2023)  Financial Resource Strain: Low Risk  (07/16/2022)  Physical Activity: Insufficiently Active (07/16/2022)  Social Connections: Socially Isolated (05/19/2023)  Stress: Stress Concern Present (07/16/2022)  Tobacco Use: Medium Risk (06/12/2023)    Readmission Risk Interventions     No data to display

## 2023-06-15 NOTE — ED Notes (Signed)
Pt placed on hospital bed. Pt bed placed in lowest level. No other needs at this time.

## 2023-06-15 NOTE — TOC Progression Note (Signed)
Transition of Care [facility]) - Progression Note    Patient Details  Name: Justin Ali MRN: [id number] Date of Birth: March 05, 1941  [facility]) CM/SW Contact  Erin Sons, [place] Phone Number: 06/15/2023, 10:34 AM  Clinical Narrative:     [facility]; they do not have any beds. Neither does their [place] facility.  CSW awaiting response from [facility].   [name] is reviewing pt; awaiting response.    Barriers to Discharge: Other (must enter comment) (waiting on PT to see for recommendation on placement , if SNF is recommended . Family wants another SNF other than [facility])  Expected Discharge Plan and Services                                               Social Determinants of Health (SDOH) Interventions SDOH Screenings   Food Insecurity: No Food Insecurity (05/19/2023)  Housing: Low Risk  (05/19/2023)  Transportation Needs: No Transportation Needs (05/19/2023)  Utilities: Not At Risk (05/19/2023)  Alcohol Screen: Low Risk  (07/16/2022)  Depression (PHQ2-9): Low Risk  (03/13/2023)  Recent Concern: Depression (PHQ2-9) - High Risk (01/16/2023)  Financial Resource Strain: Low Risk  (07/16/2022)  Physical Activity: Insufficiently Active (07/16/2022)  Social Connections: Socially Isolated (05/19/2023)  Stress: Stress Concern Present (07/16/2022)  Tobacco Use: Medium Risk (06/12/2023)    Readmission Risk Interventions     No data to display

## 2023-06-15 NOTE — ED Notes (Signed)
Pt had BM in bed. Pt was cleaned and brief, sheet, blanket, bed pad and pillowcase were replaced. Pt was repositioned and calm at this time.

## 2023-06-15 NOTE — ED Notes (Signed)
Patient currently in the bathroom attempting to have a bowel movement.

## 2023-06-16 NOTE — ED Notes (Signed)
Vaseline gauze applied to R foot heel and wrapped with plain gauze and tape.

## 2023-06-16 NOTE — TOC Progression Note (Addendum)
Transition of Care [facility]) - Progression Note    Patient Details  Name: Justin Ali MRN: [id number] Date of Birth: [birth year]  Transition of Care [facility]) CM/SW Contact  Pryce Folts Aris Lot, [place] Phone Number: 06/16/2023, 10:08 AM  Clinical Narrative:     CSW informed by [facility] that they will still need to do an asset check before pt can admit. [name] is pending.   1245: CSW updated pt's daughter in law. CSW uploaded MD stating that pt is medically stable as requested by insurance. Auth status still pending  Expected Discharge Plan and Services                                               Social Determinants of Health (SDOH) Interventions SDOH Screenings   Food Insecurity: No Food Insecurity (05/19/2023)  Housing: Low Risk  (05/19/2023)  Transportation Needs: No Transportation Needs (05/19/2023)  Utilities: Not At Risk (05/19/2023)  Alcohol Screen: Low Risk  (07/16/2022)  Depression (PHQ2-9): Low Risk  (03/13/2023)  Recent Concern: Depression (PHQ2-9) - High Risk (01/16/2023)  Financial Resource Strain: Low Risk  (07/16/2022)  Physical Activity: Insufficiently Active (07/16/2022)  Social Connections: Socially Isolated (05/19/2023)  Stress: Stress Concern Present (07/16/2022)  Tobacco Use: Medium Risk (06/12/2023)    Readmission Risk Interventions     No data to display

## 2023-06-16 NOTE — ED Provider Notes (Addendum)
Patient is resting comfortably has eaten lunch.  He is medically stable for further treatment with anticipated physical therapy and Occupational Therapy to be at a rehab or nursing facility   Sharyn Creamer, MD 06/16/23 1230   He reports he does not wish to return to his previous facility.  Discussed with him, he is understanding that social work team is helping to evaluate for options for other care facilities.    Sharyn Creamer, MD 06/16/23 [phone]
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
## 2023-06-16 NOTE — ED Notes (Signed)
Breakfast, lunch, and dinner trays removed. Patient did not eat full meal, only bites of meals. Did drink a full can of shasta cola.

## 2023-06-17 ENCOUNTER — Emergency Department: Payer: No Typology Code available for payment source

## 2023-06-17 MED ORDER — LORAZEPAM 2 MG/ML IJ SOLN
2.0000 mg | Freq: Once | INTRAMUSCULAR | Status: AC
  Administered 2023-06-17: 2 mg via INTRAMUSCULAR
  Filled 2023-06-17: qty 1

## 2023-06-17 MED ORDER — ABIRATERONE ACETATE 250 MG PO TABS: 1000.0000 mg | ORAL_TABLET | ORAL | Status: DC
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
## 2023-06-17 NOTE — TOC Progression Note (Signed)
Transition of Care [facility]) - Progression Note    Patient Details  Name: Justin Ali MRN: [id number] Date of Birth: [birth year]  Transition [facility] Care [facility]) CM/SW Contact  Erin Sons, [place] Phone Number: 06/17/2023, 9:57 AM  Clinical Narrative:     Insurance auth still pending.   Asset check by [facility] still pending.   TOC will continue to follow.      Barriers to Discharge: Other (must enter comment) (waiting on PT to see for recommendation on placement , if SNF is recommended . Family wants another SNF other than [facility])  Expected Discharge Plan and Services                                               Social Determinants of Health (SDOH) Interventions SDOH Screenings   Food Insecurity: No Food Insecurity (05/19/2023)  Housing: Low Risk  (05/19/2023)  Transportation Needs: No Transportation Needs (05/19/2023)  Utilities: Not At Risk (05/19/2023)  Alcohol Screen: Low Risk  (07/16/2022)  Depression (PHQ2-9): Low Risk  (03/13/2023)  Recent Concern: Depression (PHQ2-9) - High Risk (01/16/2023)  Financial Resource Strain: Low Risk  (07/16/2022)  Physical Activity: Insufficiently Active (07/16/2022)  Social Connections: Socially Isolated (05/19/2023)  Stress: Stress Concern Present (07/16/2022)  Tobacco Use: Medium Risk (06/12/2023)    Readmission Risk Interventions     No data to display

## 2023-06-17 NOTE — ED Notes (Signed)
Pt refused snack 
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
## 2023-06-17 NOTE — ED Notes (Signed)
Pt given meal at this time.

## 2023-06-17 NOTE — ED Notes (Signed)
Pt ambulatory to bathroom at this time. Pt had BM at this time. Pt assisted by EDT.

## 2023-06-17 NOTE — NC FL2 (Cosign Needed)
[place] MEDICAID FL2 LEVEL OF CARE FORM     IDENTIFICATION  Patient Name: Justin Ali Birthdate: 1940-11-26 Sex: male Admission Date (Current Location): 06/12/2023  [facility] and [place] Number:  Chiropodist and Address:  [facility], [address] [place], [place] [address]      Provider Number: [id number]  Attending Physician Name and Address:  No att. providers found  Relative Name and Phone Number:  Adelfo, Diebel) [phone]    Current Level of Care: Hospital Recommended Level of Care: Memory Care, Skilled Nursing Facility Prior Approval Number:    Date Approved/Denied:   PASRR Number: [id number]  Discharge Plan: Other (Comment) (Memory care)    Current Diagnoses: Patient Active Problem List   Diagnosis Date Noted   Hypercalcemia of malignancy 05/22/2023   Hypokalemia 05/22/2023   Hypernatremia 05/22/2023   AKI (acute kidney injury) (HCC) 05/22/2023   Syncope 05/19/2023   Encephalopathy 05/19/2023   Sinus bradycardia 05/19/2023   Chronic pain syndrome 12/21/2022   Sore on leg 11/05/2022   Pedal edema 10/13/2022   Constipation 10/13/2022   Positive RPR test 08/22/2022   Cognitive and behavioral changes 03/18/2022   Memory deficit 03/15/2022   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   History of GI bleed 01/02/2022   Chronic kidney disease, stage 3a (HCC)    Prostate cancer metastatic to bone [facility])    History of gastric ulcer    Low serum vitamin B12 07/10/2021   Bilateral flank pain 11/21/2020   Tinea cruris 07/13/2020   Choroidal lesion 08/19/2019   Combined forms of age-related cataract of both eyes 08/19/2019   DNR (do not resuscitate) 07/04/2019   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   Metastasis from malignant neoplasm of prostate (HCC) 06/24/2019   Nuclear sclerosis of both eyes 06/24/2019   Laxity of eyelid 01/16/2018   Chronic  insomnia 01/02/2017   Primary malignant 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 (HCC) 09/24/2016   Benign prostatic hyperplasia 06/12/2015   Advanced care planning/counseling discussion 06/07/2014   Unintentional weight loss 06/07/2014   Hearing loss due to cerumen impaction 06/07/2014   CAD (coronary artery disease), native coronary artery 06/02/2013   Angiodysplasia of cecum 03/18/2013   History of colonic polyps 08/05/2012   Medicare annual wellness visit, subsequent 06/02/2012   Health maintenance examination 06/02/2011   Fatty liver 06/02/2011   HYPERTENSION, BENIGN ESSENTIAL 11/02/2006   Dyslipidemia 10/27/2006   RESTLESS LEG SYNDROME 10/27/2006   Hereditary and idiopathic peripheral neuropathy 10/27/2006   Sleep apnea 10/27/2006    Orientation RESPIRATION BLADDER Height & Weight     Self  Normal Incontinent Weight: 160 lb (72.6 kg) Height:  5\' 6"  (167.6 cm)  BEHAVIORAL SYMPTOMS/MOOD NEUROLOGICAL BOWEL NUTRITION STATUS      Continent Diet (see d/c summary)  AMBULATORY STATUS COMMUNICATION OF NEEDS Skin   Extensive Assist Verbally Normal                       Personal Care Assistance Level of Assistance  Bathing, Feeding, Dressing Bathing Assistance: Maximum assistance Feeding assistance: Limited assistance Dressing Assistance: Maximum assistance     Functional Limitations Info  Sight, Hearing Sight Info: Adequate Hearing Info: Impaired Speech Info: Impaired    SPECIAL CARE FACTORS FREQUENCY  PT (By licensed PT), OT (By licensed OT)     PT Frequency: 5x/week OT Frequency: 5x/week  Contractures Contractures Info: Not present    Additional Factors Info  Code Status, Allergies Code Status Info: DNR Allergies Info: no known allergies           Current Medications (06/17/2023):  This is the current hospital active medication list Current Facility-Administered Medications  Medication Dose Route Frequency Provider Last Rate Last Admin    abiraterone acetate (ZYTIGA) tablet 1,000 mg  1,000 mg Oral Shirline Frees, MD       amLODipine (NORVASC) tablet 5 mg  5 mg Oral Daily Ward, Kristen N, DO   5 mg at 06/16/23 4098   feeding supplement (ENSURE ENLIVE / ENSURE PLUS) liquid 237 mL  237 mL Oral BID BM Ward, Kristen N, DO   237 mL at 06/16/23 1748   furosemide (LASIX) tablet 20 mg  20 mg Oral Daily PRN Ward, Kristen N, DO       LORazepam (ATIVAN) injection 2 mg  2 mg Intramuscular Once Delton Prairie, MD       melatonin tablet 10 mg  10 mg Oral QHS Ward, Kristen N, DO   10 mg at 06/16/23 2038   omega-3 acid ethyl esters (LOVAZA) capsule 1 g  1 g Oral Daily Ward, Kristen N, DO   1 g at 06/17/23 0921   ondansetron (ZOFRAN) tablet 4 mg  4 mg Oral Q6H PRN Ward, Kristen N, DO       pantoprazole (PROTONIX) EC tablet 40 mg  40 mg Oral Daily Ward, Kristen N, DO   40 mg at 06/17/23 1191   polyethylene glycol (MIRALAX / GLYCOLAX) packet 17 g  17 g Oral Daily PRN Ward, Kristen N, DO   17 g at 06/15/23 1524   potassium chloride SA (KLOR-CON M) CR tablet 20 mEq  20 mEq Oral Daily Ward, Kristen N, DO   20 mEq at 06/17/23 4782   QUEtiapine (SEROQUEL) tablet 25 mg  25 mg Oral QHS Ward, Kristen N, DO   25 mg at 06/16/23 2038   Current Outpatient Medications  Medication Sig Dispense Refill   abiraterone acetate (ZYTIGA) 250 MG tablet Take 1,000 mg by mouth every other day. Take on an empty stomach 1 hour before or 2 hours after a meal     furosemide (LASIX) 20 MG tablet Take 1 tablet (20 mg total) by mouth daily as needed for edema. For leg swelling     melatonin 5 MG TABS Take 10 mg by mouth at bedtime.     Omega-3 Fatty Acids (FISH OIL) 1000 MG CAPS Take 1,000 mg by mouth daily.     omeprazole (PRILOSEC) 20 MG capsule Take 1 capsule (20 mg total) by mouth daily. 90 capsule 2   ondansetron (ZOFRAN) 4 MG tablet Take 1 tablet (4 mg total) by mouth every 6 (six) hours as needed for nausea.     polyethylene glycol powder (GLYCOLAX/MIRALAX) 17 GM/SCOOP  powder Take 8.5 g by mouth daily as needed for moderate constipation.     potassium chloride SA (KLOR-CON M) 10 MEQ tablet Take 1 tablet (10 mEq total) by mouth daily. 90 tablet 3   QUEtiapine (SEROQUEL) 25 MG tablet Take 25 mg by mouth at bedtime.     traMADol (ULTRAM) 50 MG tablet Take 1 tablet (50 mg total) by mouth every 12 (twelve) hours as needed for moderate pain (pain score 4-6). 10 tablet 0   amLODipine (NORVASC) 5 MG tablet Take 1 tablet (5 mg total) by mouth daily.     Cholecalciferol (VITAMIN  D3) 50 MCG (2000 UT) CAPS Take 2,000 Units by mouth every other day.     feeding supplement (ENSURE ENLIVE / ENSURE PLUS) LIQD Take 237 mLs by mouth 2 (two) times daily between meals.       Discharge Medications: Please see discharge summary for a list of discharge medications.  Relevant Imaging Results:  Relevant Lab Results:   Additional Information SSN: [id number]  Erin Sons, LCSW

## 2023-06-17 NOTE — ED Provider Notes (Signed)
-----------------------------------------   5:36 AM on 06/17/2023 -----------------------------------------   Blood pressure 98/69, pulse 66, temperature 98.8 F (37.1 C), temperature source Oral, resp. rate 16, height 5\' 6"  (1.676 m), weight 72.6 kg, SpO2 100%.  The patient is calm and cooperative at this time.  There have been no acute events since the last update.  Awaiting disposition plan from Social Work team.   Irean Hong, MD 06/17/23 [phone]

## 2023-06-17 NOTE — ED Notes (Addendum)
Hospital meal provided. Pt states he wants to rest more and then eat. Meal left at bedside on bedside table.

## 2023-06-17 NOTE — ED Notes (Signed)
Pt offered lunch. Pt sleeping and declining lunch.

## 2023-06-17 NOTE — ED Notes (Signed)
Patient's paper scrub pants changed into a smaller size. Patient denies other needs at this time.

## 2023-06-17 NOTE — ED Notes (Signed)
 TOC

## 2023-06-17 NOTE — ED Notes (Signed)
Snack given by EDT.

## 2023-06-17 NOTE — ED Notes (Signed)
Pt taken to CT with security escort at this time.

## 2023-06-17 NOTE — ED Notes (Signed)
RN attempted to get pt dressed out of clothing and into hospital appropriate attire. Pt questioning RN. RN provided pt with appropriate answers to questions. Pt began to get agitated but was agreeable to change clothes after shower and breakfast. No harmful objects found on pt at this time.

## 2023-06-18 NOTE — ED Notes (Signed)
 Hospital meal provided, pt tolerated w/o complaints.  Waste discarded appropriately.

## 2023-06-18 NOTE — ED Notes (Signed)
 TOC

## 2023-06-18 NOTE — TOC Progression Note (Signed)
Transition of Care [facility]) - Progression Note    Patient Details  Name: Justin Ali MRN: [id number] Date of Birth: 02/19/1941  Transition of Care [facility]) CM/SW Contact  Erin Sons, [place] Phone Number: 06/18/2023, 4:20 PM  Clinical Narrative:    DIL aware of [name] coming to assess pt. Discussed sending additional referrals as back up plan.  Additional referrals faxed to the following memory care facilities that pt's DIL has identified:  [facility] Phone-[phone] Fax [phone]  [facility] Phone- [phone] Fax- [phone]  [facility] fax [phone]  Left voicemail with Court[facility] requesting return call.   DIL is also touring a [facility] [employer] fast streamer care facility, [facility] in [place].   Expected Discharge Plan: Skilled Nursing Facility Barriers to Discharge: Other (must enter comment) (waiting on assets check and in person assessment with [facility])  Expected Discharge Plan and Services                                               Social Determinants of Health (SDOH) Interventions SDOH Screenings   Food Insecurity: No Food Insecurity (05/19/2023)  Housing: Low Risk  (05/19/2023)  Transportation Needs: No Transportation Needs (05/19/2023)  Utilities: Not At Risk (05/19/2023)  Alcohol Screen: Low Risk  (07/16/2022)  Depression (PHQ2-9): Low Risk  (03/13/2023)  Recent Concern: Depression (PHQ2-9) - High Risk (01/16/2023)  Financial Resource Strain: Low Risk  (07/16/2022)  Physical Activity: Insufficiently Active (07/16/2022)  Social Connections: Socially Isolated (05/19/2023)  Stress: Stress Concern Present (07/16/2022)  Tobacco Use: Medium Risk (06/12/2023)    Readmission Risk Interventions     No data to display

## 2023-06-18 NOTE — TOC Progression Note (Signed)
Transition of Care [facility]) - Progression Note    Patient Details  Name: Justin Ali MRN: [id number] Date of Birth: 07-25-1940  [facility]) CM/SW Contact  Justin Ali, [place] Phone Number: 06/18/2023, 10:16 AM  Clinical Narrative:     Insurance auth went to peer to peer. MD notified of peer to peer details. If Justin Ali is denied pt will still private pay for [facility]. Justin Ali has been running an Training and development officer. They are unable to give CSW an update on asset search this morning though do inform CSW that they will need to assess pt in person tomorrow.   Expected Discharge Plan: Skilled Nursing Facility Barriers to Discharge: Other (must enter comment) (waiting on assets check and in person assessment with [facility])  Expected Discharge Plan and Services                                               Social Determinants of Health (SDOH) Interventions SDOH Screenings   Food Insecurity: No Food Insecurity (05/19/2023)  Housing: Low Risk  (05/19/2023)  Transportation Needs: No Transportation Needs (05/19/2023)  Utilities: Not At Risk (05/19/2023)  Alcohol Screen: Low Risk  (07/16/2022)  Depression (PHQ2-9): Low Risk  (03/13/2023)  Recent Concern: Depression (PHQ2-9) - High Risk (01/16/2023)  Financial Resource Strain: Low Risk  (07/16/2022)  Physical Activity: Insufficiently Active (07/16/2022)  Social Connections: Socially Isolated (05/19/2023)  Stress: Stress Concern Present (07/16/2022)  Tobacco Use: Medium Risk (06/12/2023)    Readmission Risk Interventions     No data to display

## 2023-06-18 NOTE — ED Provider Notes (Signed)
Emergency Medicine Observation Re-evaluation Note  Physical Exam   BP 107/66 (BP Location: Right Arm)   Pulse 89   Temp 98 F (36.7 C)   Resp 16   Ht 5\' 6"  (1.676 m)   Wt 72.6 kg   SpO2 98%   BMI 25.82 kg/m   Patient appears in no acute distress.  ED Course / MDM   No reported events during my shift at the time of this note.   Pt is awaiting dispo from consultants   Pilar Jarvis MD    Pilar Jarvis, MD 06/18/23 [phone]

## 2023-06-18 NOTE — ED Notes (Signed)
 Pt provided with breakfast tray.

## 2023-06-19 MED ORDER — TUBERCULIN PPD 5 UNIT/0.1ML ID SOLN
5.0000 [IU] | INTRADERMAL | Status: AC
  Administered 2023-06-19: 5 [IU] via INTRADERMAL
  Filled 2023-06-19: qty 0.1

## 2023-06-19 NOTE — ED Notes (Signed)
VOL/ TOC Pending placement

## 2023-06-19 NOTE — ED Provider Notes (Signed)
Emergency Medicine Observation Re-evaluation Note  Physical Exam   BP (!) 105/56   Pulse 77   Temp 98.9 F (37.2 C) (Oral)   Resp 16   Ht 5\' 6"  (1.676 m)   Wt 72.6 kg   SpO2 98%   BMI 25.82 kg/m   Patient appears in no acute distress.  ED Course / MDM   No reported events during my shift at the time of this note.   Pt is awaiting dispo from consultants   Pilar Jarvis MD    Pilar Jarvis, MD 06/19/23 [phone]

## 2023-06-19 NOTE — ED Notes (Signed)
 VOL/TOC Placement

## 2023-06-19 NOTE — ED Notes (Signed)
 Hospital meal provided.  100% consumed, pt tolerated w/o complaints.  Waste discarded appropriately.

## 2023-06-19 NOTE — ED Notes (Signed)
TB skin test given. LFA. Please read when due

## 2023-06-19 NOTE — TOC Initial Note (Signed)
Transition of Care [name]) - Initial/Assessment Note    Patient Details  Name: Justin Ali MRN: [id number] Date of Birth: 10/29/1940  Transition [facility] Care [facility]) CM/SW Contact:    Erin Sons, LCSW Phone Number: 06/19/2023, 2:26 PM  Clinical Narrative:                  [facility] can no longer offer a bed due to concern about pt's behaviors. CSW spoke with pt's DIL who is aware. She would like to move forward with aternative plan, [facility]. CSW has been communicate with Tyesha [facility] at [facility] [email or web address], [phone]). CSW faxed clinicals and FL2 to [phone]. [facility] engineer is reviewing clinicals. Virtual assessment is scheduled for 06/20/2023 on microsoft Teams at 11am. Pt's DIL or son will be present for assessment. They would need a PPD test. CSW notified MD and PPD ordered.   Expected Discharge Plan: Memory Care Barriers to Discharge: Other (must enter comment) (Pending bed availability and virtual assessment)   Patient Goals and CMS Choice Patient states their goals for this hospitalization and ongoing recovery are:: safe placement in a SNF other than [facility] CMS Medicare.gov Compare Post Acute Care list provided to:: Patient Represenative (must comment) Justin Ali- son) Choice offered to / list presented to : Adult Children      Expected Discharge Plan and Services       Living arrangements for the past 2 months: Independent Living Facility                                      Prior Living Arrangements/Services Living arrangements for the past 2 months: Independent Living Facility Lives with:: Facility Resident                   Activities of Daily Living      Permission Sought/Granted                  Emotional Assessment              Admission diagnosis:  Fall Head Injury Patient Active Problem List   Diagnosis Date Noted   Hypercalcemia of malignancy 05/22/2023    Hypokalemia 05/22/2023   Hypernatremia 05/22/2023   AKI (acute kidney injury) (HCC) 05/22/2023   Syncope 05/19/2023   Encephalopathy 05/19/2023   Sinus bradycardia 05/19/2023   Chronic pain syndrome 12/21/2022   Sore on leg 11/05/2022   Pedal edema 10/13/2022   Constipation 10/13/2022   Positive RPR test 08/22/2022   Cognitive and behavioral changes 03/18/2022   Memory deficit 03/15/2022   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   History of GI bleed 01/02/2022   Chronic kidney disease, stage 3a (HCC)    Prostate cancer metastatic to bone (HCC)    History of gastric ulcer    Low serum vitamin B12 07/10/2021   Bilateral flank pain 11/21/2020   Tinea cruris 07/13/2020   Choroidal lesion 08/19/2019   Combined forms of age-related cataract of both eyes 08/19/2019   DNR (do not resuscitate) 07/04/2019   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   Metastasis from malignant neoplasm of prostate (HCC) 06/24/2019   Nuclear sclerosis of both eyes 06/24/2019   Laxity of eyelid 01/16/2018   Chronic insomnia 01/02/2017   Primary malignant 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 (HCC) 09/24/2016  Benign prostatic hyperplasia 06/12/2015   Advanced care planning/counseling discussion 06/07/2014   Unintentional weight loss 06/07/2014   Hearing loss due to cerumen impaction 06/07/2014   CAD (coronary artery disease), native coronary artery 06/02/2013   Angiodysplasia of cecum 03/18/2013   History of colonic polyps 08/05/2012   Medicare annual wellness visit, subsequent 06/02/2012   Health maintenance examination 06/02/2011   Fatty liver 06/02/2011   HYPERTENSION, BENIGN ESSENTIAL 11/02/2006   Dyslipidemia 10/27/2006   RESTLESS LEG SYNDROME 10/27/2006   Hereditary and idiopathic peripheral neuropathy 10/27/2006   Sleep apnea 10/27/2006   PCP:  Eustaquio Boyden, MD Pharmacy:   [facility] - [place], [place] - [address] [place] [address] Phone: [phone] Fax: [phone]  [facility] - [place], [place] - [address] [place] [address] Phone: [phone] Fax: [phone]     Social Drivers of Health (SDOH) Social History: SDOH Screenings   Food Insecurity: No Food Insecurity (05/19/2023)  Housing: Low Risk  (05/19/2023)  Transportation Needs: No Transportation Needs (05/19/2023)  Utilities: Not At Risk (05/19/2023)  Alcohol Screen: Low Risk  (07/16/2022)  Depression (PHQ2-9): Low Risk  (03/13/2023)  Recent Concern: Depression (PHQ2-9) - High Risk (01/16/2023)  Financial Resource Strain: Low Risk  (07/16/2022)  Physical Activity: Insufficiently Active (07/16/2022)  Social Connections: Socially Isolated (05/19/2023)  Stress: Stress Concern Present (07/16/2022)  Tobacco Use: Medium Risk (06/12/2023)   SDOH Interventions:     Readmission Risk Interventions     No data to display
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
## 2023-06-19 NOTE — ED Notes (Signed)
 Breakfast provided.

## 2023-06-20 LAB — GASTROINTESTINAL PANEL BY PCR, STOOL (REPLACES STOOL CULTURE)

## 2023-06-20 NOTE — ED Notes (Signed)
 This NT provided pt with pm snack.

## 2023-06-20 NOTE — ED Notes (Signed)
 Patient meal tray provided

## 2023-06-20 NOTE — ED Provider Notes (Signed)
-----------------------------------------   6:33 AM on 06/20/2023 -----------------------------------------   Blood pressure (!) 141/66, pulse 61, temperature 98.3 F (36.8 C), temperature source Oral, resp. rate 19, height 5\' 6"  (1.676 m), weight 72.6 kg, SpO2 97%.  The patient is calm and cooperative at this time.  Patient was noted by nursing staff to have several bowel movements without abdominal pain or fever.  Stool was tested although I was unable to order C. difficile as patient has been in the ED for greater than 4 days and C. difficile order can only be done by infectious disease or CMO.  Given patient was lacking symptoms of significant abdominal cramping or pain, fever, and stool was not malodorous, it was deemed unnecessary to consult ID/CMO in the middle of the night.  Stool studies positive for ETEC.  Given patient's well-appearing appearance and lack of severity of symptoms, diarrhea should run its course thus antibiotics are not warranted at this time.  Awaiting disposition plan from Social Work team.   Irean Hong, MD 06/20/23 [phone]

## 2023-06-20 NOTE — ED Notes (Signed)
Patient is vol pending placement 

## 2023-06-20 NOTE — ED Notes (Signed)
 MD notified of critical GI panel result.

## 2023-06-20 NOTE — ED Notes (Signed)
 RN noted pt had fecal incontinence during hourly rounding.  Stool is partially formed, soft, and watery.  RN and nurse tech assisted pt with going to bathroom, cleaning self, changing clothes.  Pt bed linens changed.  Pt assisted back into bed.   RN notified MD that pt had an episode of loose stools on 02/17.  GI panel ordered and collected.

## 2023-06-20 NOTE — TOC Progression Note (Addendum)
 Transition of Care [facility]) - Progression Note    Patient Details  Name: Justin Ali MRN: [id number] Date of Birth: Mar 03, 1941  Transition of Care [facility]) CM/SW Contact  Liliana Cline, LCSW Phone Number: 06/20/2023, 11:35 AM  Clinical Narrative:    CSW met with patient, daughter in law, and son at bedside to complete Microsoft Teams assessment with [facility] Executive Director [phone].  Checked with RN - PPD to be read tomorrow.  Asked Ty with [facility] for update after assessment was completed - awaiting response.  11:50- Sent MAR to Ty by encrypted email per her request.  12:17- Progress notes sent to Ty by encrypted email per her request.  2:50- Answered Ty's additional questions. She states the next step is to get final approval from her corporate clinical team to confirm if they can accept patient. If accepted, they will need to complete contract signing with the patient which she states would occur on Tuesday then the ALF room would need to be furnished. The address of this facility is [address] [place], [place] [address]. Ty states she hopes to have an answer by Monday at the latest - CSW provided contact info for TOC next week.  4:00- Ty states she will have an official answer on if they can accept patient on Monday.  Expected Discharge Plan: Memory Care Barriers to Discharge: Other (must enter comment) (Pending bed availability and virtual assessment)  Expected Discharge Plan and Services       Living arrangements for the past 2 months: Independent Living Facility                                       Social Determinants of Health (SDOH) Interventions SDOH Screenings   Food Insecurity: No Food Insecurity (05/19/2023)  Housing: Low Risk  (05/19/2023)  Transportation Needs: No Transportation Needs (05/19/2023)  Utilities: Not At Risk (05/19/2023)  Alcohol Screen: Low Risk  (07/16/2022)  Depression (PHQ2-9): Low Risk   (03/13/2023)  Recent Concern: Depression (PHQ2-9) - High Risk (01/16/2023)  Financial Resource Strain: Low Risk  (07/16/2022)  Physical Activity: Insufficiently Active (07/16/2022)  Social Connections: Socially Isolated (05/19/2023)  Stress: Stress Concern Present (07/16/2022)  Tobacco Use: Medium Risk (06/12/2023)    Readmission Risk Interventions     No data to display

## 2023-06-20 NOTE — ED Notes (Signed)
vol/toc placement.. 

## 2023-06-21 NOTE — ED Notes (Signed)
 VOL  TOC  PLACEMENT

## 2023-06-21 NOTE — ED Notes (Signed)
 Area cleaned of trash.

## 2023-06-21 NOTE — ED Notes (Signed)
 VOL  PENDING  TOC  PLACEMENT

## 2023-06-21 NOTE — ED Notes (Signed)
 Negative ppd of left forearm

## 2023-06-21 NOTE — ED Notes (Signed)
 Breakfast tray provided.

## 2023-06-21 NOTE — ED Notes (Signed)
 Pt given coffee and assisted up in the bed.

## 2023-06-21 NOTE — ED Notes (Signed)
 Pt given pm snack and beverage.

## 2023-06-21 NOTE — ED Provider Notes (Signed)
-----------------------------------------   6:05 AM on 06/21/2023 -----------------------------------------   Blood pressure (!) 102/51, pulse 70, temperature 99.2 F (37.3 C), temperature source Oral, resp. rate 17, height 5\' 6"  (1.676 m), weight 72.6 kg, SpO2 97%.  The patient is calm and cooperative at this time.  There have been no acute events since the last update.  Awaiting disposition plan from Social Work team.   Irean Hong, MD 06/21/23 [phone]

## 2023-06-21 NOTE — ED Notes (Signed)
 Pt cleaned of bowel incontinence.

## 2023-06-22 NOTE — ED Notes (Signed)
Vol  TOC  placement

## 2023-06-22 NOTE — ED Provider Notes (Signed)
-----------------------------------------   7:18 AM on 06/22/2023 -----------------------------------------   Blood pressure 97/60, pulse 66, temperature 99.3 F (37.4 C), temperature source Oral, resp. rate 17, height 1.676 m (5\' 6" ), weight 72.6 kg, SpO2 97%.  The patient is calm and cooperative at this time.  There have been no acute events since the last update.  Awaiting disposition plan from [facility] team.   Loleta Rose, MD 06/22/23 [phone]

## 2023-06-22 NOTE — ED Notes (Signed)
 Hospital meal provided.  100% consumed, pt tolerated w/o complaints.  Waste discarded appropriately.

## 2023-06-22 NOTE — ED Notes (Signed)
 Pt refused vitals but got snack and drink

## 2023-06-22 NOTE — TOC Progression Note (Addendum)
 Transition of Care [facility]) - Progression Note    Patient Details  Name: Justin Ali MRN: [id number] Date of Birth: 06/05/1940  Transition of Care [facility]) CM/SW Contact  Margarito Liner, LCSW Phone Number: 06/22/2023, 9:14 AM  Clinical Narrative:  Spoke to Ty at [facility]. She has initial approval but is waiting on financials from family which she should have by the end of the day. They will also have to furnish the room and coordinate other logistics. Ty will keep CSW updated.   10:19 am: CSW sent negative TB results to Ty.  Expected Discharge Plan: Memory Care Barriers to Discharge: Other (must enter comment) (Pending bed availability and virtual assessment)  Expected Discharge Plan and Services       Living arrangements for the past 2 months: Independent Living Facility                                       Social Determinants of Health (SDOH) Interventions SDOH Screenings   Food Insecurity: No Food Insecurity (05/19/2023)  Housing: Low Risk  (05/19/2023)  Transportation Needs: No Transportation Needs (05/19/2023)  Utilities: Not At Risk (05/19/2023)  Alcohol Screen: Low Risk  (07/16/2022)  Depression (PHQ2-9): Low Risk  (03/13/2023)  Recent Concern: Depression (PHQ2-9) - High Risk (01/16/2023)  Financial Resource Strain: Low Risk  (07/16/2022)  Physical Activity: Insufficiently Active (07/16/2022)  Social Connections: Socially Isolated (05/19/2023)  Stress: Stress Concern Present (07/16/2022)  Tobacco Use: Medium Risk (06/12/2023)    Readmission Risk Interventions     No data to display

## 2023-06-23 LAB — CBG MONITORING, ED: Glucose-Capillary: 90 mg/dL (ref 70–99)

## 2023-06-23 NOTE — ED Notes (Signed)
 Pt given snack and beverage.

## 2023-06-23 NOTE — ED Provider Notes (Signed)
-----------------------------------------   8:44 AM on 06/23/2023 -----------------------------------------   Blood pressure 99/69, pulse 62, temperature 97.8 F (36.6 C), temperature source Oral, resp. rate 16, height 5\' 6"  (1.676 m), weight 72.6 kg, SpO2 98%.  The patient is calm and cooperative at this time.  He is waiting for transfer for placement.  Patient has known history of dementia and frequently gets up to wander or try to leave the ED.  He is not fully oriented to understand this and needs occasional redirection to stay in his room.  Patient would benefit from being at a memory care facility given his dementia history.   Janith Lima, MD 06/23/23 [phone]

## 2023-06-23 NOTE — ED Notes (Signed)
 Pt bed was soiled. This tech and Research officer, trade [employer] cleaned pt bed with new sheets and given new clothing. Pt used the [facility] without help. Pt back into bed with other needs.

## 2023-06-23 NOTE — TOC Progression Note (Addendum)
 Transition of Care [facility]) - Progression Note    Patient Details  Name: Justin Ali MRN: [id number] Date of Birth: 04-May-1940  Transition of Care [facility]) CM/SW Contact  Margarito Liner, LCSW Phone Number: 06/23/2023, 11:35 AM  Clinical Narrative: CSW sent MD note to Ty at [facility].   11:44 am: Family is completing paperwork for the facility. Ty will provide update once confirmation date is determined.   Expected Discharge Plan: Memory Care Barriers to Discharge: Other (must enter comment) (Pending bed availability and virtual assessment)  Expected Discharge Plan and Services       Living arrangements for the past 2 months: Independent Living Facility                                       Social Determinants of Health (SDOH) Interventions SDOH Screenings   Food Insecurity: No Food Insecurity (05/19/2023)  Housing: Low Risk  (05/19/2023)  Transportation Needs: No Transportation Needs (05/19/2023)  Utilities: Not At Risk (05/19/2023)  Alcohol Screen: Low Risk  (07/16/2022)  Depression (PHQ2-9): Low Risk  (03/13/2023)  Recent Concern: Depression (PHQ2-9) - High Risk (01/16/2023)  Financial Resource Strain: Low Risk  (07/16/2022)  Physical Activity: Insufficiently Active (07/16/2022)  Social Connections: Socially Isolated (05/19/2023)  Stress: Stress Concern Present (07/16/2022)  Tobacco Use: Medium Risk (06/12/2023)    Readmission Risk Interventions     No data to display

## 2023-06-23 NOTE — ED Notes (Signed)
 Snack waste discarded at this time.

## 2023-06-23 NOTE — ED Notes (Signed)
 This tech obtained vital signs on pt.

## 2023-06-23 NOTE — ED Provider Notes (Signed)
 Emergency Medicine Observation Re-evaluation Note  Justin Ali is a 83 y.o. male, seen on rounds today.  Pt initially presented to the ED for complaints of Fall Currently, the patient is resting.  Physical Exam  BP 96/64   Pulse 60   Temp 98 F (36.7 C) (Oral)   Resp 18   Ht 5\' 6"  (1.676 m)   Wt 72.6 kg   SpO2 98%   BMI 25.82 kg/m  Physical Exam .Gen:  No acute distress Resp:  Breathing easily and comfortably, no accessory muscle usage Neuro:  Moving all four extremities, no gross focal neuro deficits Psych:  Resting currently, calm when awake   ED Course / MDM  EKG:   I have reviewed the labs performed to date as well as medications administered while in observation.  Recent changes in the last 24 hours include no acute events.  Plan  Current plan is for placement.    Claybon Jabs, MD 06/23/23 [phone]

## 2023-06-23 NOTE — ED Notes (Signed)
 Hospital meal provided.  100% consumed, pt tolerated w/o complaints.  Waste discarded appropriately.

## 2023-06-23 NOTE — ED Notes (Signed)
 vol/toc.Marland KitchenMarland KitchenMarland Kitchen

## 2023-06-23 NOTE — ED Notes (Signed)
Hospital meal provided, pt tolerated w/o complaints.

## 2023-06-24 MED ORDER — MELATONIN 5 MG PO TABS: 10.0000 mg | ORAL_TABLET | Freq: Every day | ORAL | 0 refills | Status: AC

## 2023-06-24 MED ORDER — AMLODIPINE BESYLATE 5 MG PO TABS: 5.0000 mg | ORAL_TABLET | Freq: Every day | ORAL | Status: AC

## 2023-06-24 MED ORDER — FISH OIL 1000 MG PO CAPS: 1000.0000 mg | ORAL_CAPSULE | Freq: Every day | ORAL | 0 refills | Status: AC

## 2023-06-24 MED ORDER — ABIRATERONE ACETATE 250 MG PO TABS: 1000.0000 mg | ORAL_TABLET | ORAL | 0 refills | Status: AC

## 2023-06-24 MED ORDER — FUROSEMIDE 20 MG PO TABS: 20.0000 mg | ORAL_TABLET | Freq: Every day | ORAL | Status: AC | PRN

## 2023-06-24 MED ORDER — ENSURE ENLIVE PO LIQD: 237.0000 mL | Freq: Two times a day (BID) | ORAL | Status: AC

## 2023-06-24 MED ORDER — POTASSIUM CHLORIDE CRYS ER 10 MEQ PO TBCR: 10.0000 meq | EXTENDED_RELEASE_TABLET | Freq: Every day | ORAL | 3 refills | Status: AC

## 2023-06-24 MED ORDER — ONDANSETRON HCL 4 MG PO TABS: 4.0000 mg | ORAL_TABLET | Freq: Four times a day (QID) | ORAL | Status: AC | PRN

## 2023-06-24 MED ORDER — POLYETHYLENE GLYCOL 3350 17 GM/SCOOP PO POWD: 8.5000 g | Freq: Every day | ORAL | 0 refills | Status: AC | PRN

## 2023-06-24 MED ORDER — QUETIAPINE FUMARATE 25 MG PO TABS: 25.0000 mg | ORAL_TABLET | Freq: Every day | ORAL | 0 refills | Status: AC

## 2023-06-24 MED ORDER — OMEPRAZOLE 20 MG PO CPDR: 20.0000 mg | DELAYED_RELEASE_CAPSULE | Freq: Every day | ORAL | 2 refills | Status: AC

## 2023-06-24 NOTE — ED Notes (Signed)
 Breakfast provided.

## 2023-06-24 NOTE — ED Provider Notes (Signed)
-----------------------------------------   4:26 AM on 06/24/2023 -----------------------------------------   Blood pressure 125/71, pulse 64, temperature 97.6 F (36.4 C), temperature source Oral, resp. rate 17, height 5\' 6"  (1.676 m), weight 72.6 kg, SpO2 98%.  The patient is calm and cooperative at this time.  There have been no acute events since the last update.  Awaiting disposition plan from case management/social work.    Czarina Gingras, Layla Maw, DO 06/24/23 [phone]

## 2023-06-24 NOTE — ED Notes (Signed)
 Dinner tray provided to patient

## 2023-06-24 NOTE — TOC Progression Note (Addendum)
 Transition of Care [facility]) - Progression Note    Patient Details  Name: Justin Ali MRN: [id number] Date of Birth: 04-Feb-1941  Transition of Care [facility] CM/SW Contact  Margarito Liner, LCSW Phone Number: 06/24/2023, 2:46 PM  Clinical Narrative:   CSW confirmed with [facility] executive director that [facility] can pay for EMS transport. Daughter and Ty with [facility] liaison are aware. Ty will call daughter to determine admission date. CSW will set up transport once date determined.  3:17 pm: [facility] can accept patient tomorrow around 1:00. [facility] scheduled for pickup around 11:00. Left daughter a voicemail to notify. Ty is checking to see which pharmacy his medications need to be sent to.  4:35 pm: Prescriptions will need to be sent to [facility]. Information sent to MD.  4:51 pm: FL2 and AVS sent to Ty.  Expected Discharge Plan: Memory Care Barriers to Discharge: Other (must enter comment) (Pending bed availability and virtual assessment)  Expected Discharge Plan and Services       Living arrangements for the past 2 months: Independent Living Facility                                       Social Determinants of Health (SDOH) Interventions SDOH Screenings   Food Insecurity: No Food Insecurity (05/19/2023)  Housing: Low Risk  (05/19/2023)  Transportation Needs: No Transportation Needs (05/19/2023)  Utilities: Not At Risk (05/19/2023)  Alcohol Screen: Low Risk  (07/16/2022)  Depression (PHQ2-9): Low Risk  (03/13/2023)  Recent Concern: Depression (PHQ2-9) - High Risk (01/16/2023)  Financial Resource Strain: Low Risk  (07/16/2022)  Physical Activity: Insufficiently Active (07/16/2022)  Social Connections: Socially Isolated (05/19/2023)  Stress: Stress Concern Present (07/16/2022)  Tobacco Use: Medium Risk (06/12/2023)    Readmission Risk Interventions     No data to display

## 2023-06-24 NOTE — ED Notes (Signed)
 Pt was given snack at this time by this tech

## 2023-06-24 NOTE — NC FL2 (Signed)
 [place] MEDICAID FL2 LEVEL OF CARE FORM     IDENTIFICATION  Patient Name: Justin Ali Birthdate: 1940-11-15 Sex: male Admission Date (Current Location): 06/12/2023  [facility] and [place] Number:  Chiropodist and Address:  [facility], [address], [place], [place] [address]      Provider Number: [id number]  Attending Physician Name and Address:  Phineas Semen, MD  Relative Name and Phone Number:  Altan, Kraai) [phone]    Current Level of Care: Hospital Recommended Level of Care: Memory Care Prior Approval Number:    Date Approved/Denied:   PASRR Number:   Discharge Plan: Other (Comment) (Memory care)    Current Diagnoses: Patient Active Problem List   Diagnosis Date Noted   Hypercalcemia of malignancy 05/22/2023   Hypokalemia 05/22/2023   Hypernatremia 05/22/2023   AKI (acute kidney injury) (HCC) 05/22/2023   Syncope 05/19/2023   Encephalopathy 05/19/2023   Sinus bradycardia 05/19/2023   Chronic pain syndrome 12/21/2022   Sore on leg 11/05/2022   Pedal edema 10/13/2022   Constipation 10/13/2022   Positive RPR test 08/22/2022   Cognitive and behavioral changes 03/18/2022   Memory deficit 03/15/2022   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   History of GI bleed 01/02/2022   Chronic kidney disease, stage 3a (HCC)    Prostate cancer metastatic to bone [facility])    History of gastric ulcer    Low serum vitamin B12 07/10/2021   Bilateral flank pain 11/21/2020   Tinea cruris 07/13/2020   Choroidal lesion 08/19/2019   Combined forms of age-related cataract of both eyes 08/19/2019   DNR (do not resuscitate) 07/04/2019   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   Metastasis from malignant neoplasm of prostate (HCC) 06/24/2019   Nuclear sclerosis of both eyes 06/24/2019   Laxity of eyelid 01/16/2018   Chronic insomnia 01/02/2017   Primary malignant  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 (HCC) 09/24/2016   Benign prostatic hyperplasia 06/12/2015   Advanced care planning/counseling discussion 06/07/2014   Unintentional weight loss 06/07/2014   Hearing loss due to cerumen impaction 06/07/2014   CAD (coronary artery disease), native coronary artery 06/02/2013   Angiodysplasia of cecum 03/18/2013   History of colonic polyps 08/05/2012   Medicare annual wellness visit, subsequent 06/02/2012   Health maintenance examination 06/02/2011   Fatty liver 06/02/2011   HYPERTENSION, BENIGN ESSENTIAL 11/02/2006   Dyslipidemia 10/27/2006   RESTLESS LEG SYNDROME 10/27/2006   Hereditary and idiopathic peripheral neuropathy 10/27/2006   Sleep apnea 10/27/2006    Orientation RESPIRATION BLADDER Height & Weight     Self  Normal Continent Weight: 160 lb (72.6 kg) Height:  5\' 6"  (167.6 cm)  BEHAVIORAL SYMPTOMS/MOOD NEUROLOGICAL BOWEL NUTRITION STATUS   None   Incontinent Diet (Regular)  AMBULATORY STATUS COMMUNICATION OF NEEDS Skin   Supervision Verbally Abrasion, Erythema/redness, Stage 3 pressure injury on right heel: Xeroform and mepitel weekly.                       Personal Care Assistance Level of Assistance        Functional Limitations Info  Sight, Hearing Sight Info: Adequate Hearing Info: Impaired Speech Info: Impaired    SPECIAL CARE FACTORS FREQUENCY                   Contractures Contractures Info: Not present    Additional Factors Info  Code Status, Allergies Code Status  Info: DNR Allergies Info: no known allergies           Current Medications (06/24/2023):  This is the current hospital active medication list Current Facility-Administered Medications  Medication Dose Route Frequency Provider Last Rate Last Admin   abiraterone acetate (ZYTIGA) tablet 1,000 mg  1,000 mg Oral Shirline Frees, MD       amLODipine (NORVASC) tablet 5 mg  5 mg Oral Daily Ward, Kristen N, DO   5 mg at 06/24/23 1610    feeding supplement (ENSURE ENLIVE / ENSURE PLUS) liquid 237 mL  237 mL Oral BID BM Ward, Kristen N, DO   237 mL at 06/24/23 1404   furosemide (LASIX) tablet 20 mg  20 mg Oral Daily PRN Ward, Kristen N, DO       melatonin tablet 10 mg  10 mg Oral QHS Ward, Kristen N, DO   10 mg at 06/23/23 2106   omega-3 acid ethyl esters (LOVAZA) capsule 1 g  1 g Oral Daily Ward, Kristen N, DO   1 g at 06/24/23 0957   ondansetron (ZOFRAN) tablet 4 mg  4 mg Oral Q6H PRN Ward, Kristen N, DO       pantoprazole (PROTONIX) EC tablet 40 mg  40 mg Oral Daily Ward, Kristen N, DO   40 mg at 06/24/23 0958   polyethylene glycol (MIRALAX / GLYCOLAX) packet 17 g  17 g Oral Daily PRN Ward, Kristen N, DO   17 g at 06/15/23 1524   potassium chloride SA (KLOR-CON M) CR tablet 20 mEq  20 mEq Oral Daily Ward, Kristen N, DO   20 mEq at 06/24/23 0957   QUEtiapine (SEROQUEL) tablet 25 mg  25 mg Oral QHS Ward, Kristen N, DO   25 mg at 06/23/23 2106   Current Outpatient Medications  Medication Sig Dispense Refill   traMADol (ULTRAM) 50 MG tablet Take 1 tablet (50 mg total) by mouth every 12 (twelve) hours as needed for moderate pain (pain score 4-6). 10 tablet 0   abiraterone acetate (ZYTIGA) 250 MG tablet Take 4 tablets (1,000 mg total) by mouth every other day. Take on an empty stomach 1 hour before or 2 hours after a meal 120 tablet 0   amLODipine (NORVASC) 5 MG tablet Take 1 tablet (5 mg total) by mouth daily.     Cholecalciferol (VITAMIN D3) 50 MCG (2000 UT) CAPS Take 2,000 Units by mouth every other day.     feeding supplement (ENSURE ENLIVE / ENSURE PLUS) LIQD Take 237 mLs by mouth 2 (two) times daily between meals.     furosemide (LASIX) 20 MG tablet Take 1 tablet (20 mg total) by mouth daily as needed for edema. For leg swelling     melatonin 5 MG TABS Take 2 tablets (10 mg total) by mouth at bedtime. 30 tablet 0   Omega-3 Fatty Acids (FISH OIL) 1000 MG CAPS Take 1 capsule (1,000 mg total) by mouth daily. 30 capsule 0    omeprazole (PRILOSEC) 20 MG capsule Take 1 capsule (20 mg total) by mouth daily. 90 capsule 2   ondansetron (ZOFRAN) 4 MG tablet Take 1 tablet (4 mg total) by mouth every 6 (six) hours as needed for nausea.     polyethylene glycol powder (GLYCOLAX/MIRALAX) 17 GM/SCOOP powder Take 8.5 g by mouth daily as needed for moderate constipation. 255 g 0   potassium chloride (KLOR-CON M) 10 MEQ tablet Take 1 tablet (10 mEq total) by mouth daily. 90 tablet 3  QUEtiapine (SEROQUEL) 25 MG tablet Take 1 tablet (25 mg total) by mouth at bedtime. 30 tablet 0     Discharge Medications: TAKE these medications TAKE these medications  abiraterone acetate 250 MG tablet Commonly known as: ZYTIGA Take 4 tablets (1,000 mg total) by mouth every other day. Take on an empty stomach 1 hour before or 2 hours after a meal   amLODipine 5 MG tablet Commonly known as: NORVASC Take 1 tablet (5 mg total) by mouth daily.   feeding supplement Liqd Take 237 mLs by mouth 2 (two) times daily between meals.   Fish Oil 1000 MG Caps Take 1 capsule (1,000 mg total) by mouth daily.   furosemide 20 MG tablet Commonly known as: LASIX Take 1 tablet (20 mg total) by mouth daily as needed for edema. For leg swelling   melatonin 5 MG Tabs Take 2 tablets (10 mg total) by mouth at bedtime.   omeprazole 20 MG capsule Commonly known as: PRILOSEC Take 1 capsule (20 mg total) by mouth daily.   ondansetron 4 MG tablet Commonly known as: ZOFRAN Take 1 tablet (4 mg total) by mouth every 6 (six) hours as needed for nausea.   polyethylene glycol powder 17 GM/SCOOP powder Commonly known as: GLYCOLAX/MIRALAX Take 8.5 g by mouth daily as needed for moderate constipation.   potassium chloride 10 MEQ tablet Commonly known as: KLOR-CON M Take 1 tablet (10 mEq total) by mouth daily.   QUEtiapine 25 MG tablet Commonly known as: SEROQUEL Take 1 tablet (25 mg total) by mouth at bedtime.   ASK your doctor about these medications ASK your doctor about  these medications  traMADol 50 MG tablet Commonly known as: ULTRAM Take 1 tablet (50 mg total) by mouth every 12 (twelve) hours as needed for moderate pain (pain score 4-6).   vitamin D3 50 MCG (2000 UT) Caps Take 2,000 Units by mouth every other day.    Relevant Imaging Results:  Relevant Lab Results:   Additional Information SSN: [id number]  Margarito Liner, LCSW

## 2023-06-24 NOTE — Consult Note (Signed)
 WOC Nurse Consult Note: Reason for Consult: right heel wound Appears first noted 06/15/22 and dressing per skin care order set at that time Wound type: Stage 3 Pressure Injury Pressure Injury POA: no, however based on the hyperkeratosis around the wound bed, this is not an acute wound. Feel that it was present, but not noted by ED staff  Measurement: see nursing flow sheets Wound ZHY:QMVH, non granular Drainage (amount, consistency, odor) none documented Periwound:hyperkeratosis typically seen with chronic non healing wounds Dressing procedure/placement/frequency: Cleanse with saline, pat dry Apply single layer of xeroform gauze t(Lawson # [id number])op with heel foam dressing (Mepitel)(Lawson # [id number])     Re consult if needed, will not follow at this time. Thanks  Shanteria Laye [employer], RN,CWOCN, CNS, CWON-AP [phone])

## 2023-06-25 MED ORDER — LORAZEPAM 1 MG PO TABS
1.0000 mg | ORAL_TABLET | Freq: Once | ORAL | Status: AC
  Administered 2023-06-25: 1 mg via ORAL
  Filled 2023-06-25: qty 1

## 2023-06-25 NOTE — ED Notes (Signed)
 Pt out of room telling police officer he is leaving. Pt appears irritable and anxious. Pt redirectable and encouraged to return to room. Pt assisted to bed and door closed. MD aware.

## 2023-06-25 NOTE — ED Notes (Signed)
 VOL/TOC Placement

## 2023-06-25 NOTE — Progress Notes (Signed)
 Patient's daughter in law, Pascual Mantel called and requested a note be placed on patients chart that stated please do not give out any of patient's information listed to anyone other than herself Leafy Kindle) and patients son Oniel Meleski.

## 2023-06-25 NOTE — TOC Transition Note (Signed)
 Transition of Care [facility]) - Discharge Note   Patient Details  Name: Justin Ali MRN: [id number] Date of Birth: [birth year]  [facility]) CM/SW Contact:  Margarito Liner, LCSW Phone Number: 06/25/2023, 10:16 AM   Clinical Narrative:   Patient will discharge to [facility] in [place], [place] today. [facility] called and confirmed they are still planning to be here around 11:00. RN is waiting on a call back from the facility to give report. No further concerns. CSW signing off.  Final next level of care: Assisted Living (Memory Care) Barriers to Discharge: Barriers Resolved   Patient Goals and CMS Choice Patient states their goals for this hospitalization and ongoing recovery are:: safe placement in a SNF other than [facility] CMS Medicare.gov Compare Post Acute Care list provided to:: Patient Represenative (must comment) [name]- son) Choice offered to / list presented to : Adult Children      Discharge Placement                Patient to be transferred to facility by: [facility] Name of family member notified: Curley Hogen Patient and family notified of of transfer: 06/25/23  Discharge Plan and Services Additional resources added to the After Visit Summary for                                       Social Drivers of Health (SDOH) Interventions SDOH Screenings   Food Insecurity: No Food Insecurity (05/19/2023)  Housing: Low Risk  (05/19/2023)  Transportation Needs: No Transportation Needs (05/19/2023)  Utilities: Not At Risk (05/19/2023)  Alcohol Screen: Low Risk  (07/16/2022)  Depression (PHQ2-9): Low Risk  (03/13/2023)  Recent Concern: Depression (PHQ2-9) - High Risk (01/16/2023)  Financial Resource Strain: Low Risk  (07/16/2022)  Physical Activity: Insufficiently Active (07/16/2022)  Social Connections: Socially Isolated (05/19/2023)  Stress: Stress Concern Present (07/16/2022)  Tobacco Use: Medium Risk (06/12/2023)      Readmission Risk Interventions     No data to display

## 2023-06-25 NOTE — ED Provider Notes (Signed)
 Procedures     ----------------------------------------- 9:25 AM on 06/25/2023 -----------------------------------------  Patient is planned for discharge today.  Patient has dementia and metastatic cancer, has guardian, CODE STATUS has been determined to be DNR/DNI which is reasonable given his baseline state of health.  DNR form completed to accompany him on discharge.    Sharman Cheek, MD 06/25/23 [phone]

## 2023-06-25 NOTE — ED Notes (Signed)
 Transport arrived to take pt to [facility]

## 2023-06-25 NOTE — ED Provider Notes (Signed)
 Emergency Medicine Observation Re-evaluation Note  Physical Exam   BP 121/86   Pulse 73   Temp 98.3 F (36.8 C) (Oral)   Resp 18   Ht 5\' 6"  (1.676 m)   Wt 72.6 kg   SpO2 100%   BMI 25.82 kg/m   Patient appears in no acute distress.  ED Course / MDM   No reported events during my shift at the time of this note.   Pt is awaiting dispo from consultants   Pilar Jarvis MD    Pilar Jarvis, MD 06/25/23 [phone]

## 2023-07-03 ENCOUNTER — Encounter: Payer: Self-pay | Admitting: Oncology

## 2023-07-03 DIAGNOSIS — S51812A Laceration without foreign body of left forearm, initial encounter: Secondary | ICD-10-CM | POA: Diagnosis not present

## 2023-07-06 DIAGNOSIS — S51812A Laceration without foreign body of left forearm, initial encounter: Secondary | ICD-10-CM | POA: Diagnosis not present

## 2023-07-08 DIAGNOSIS — S51812A Laceration without foreign body of left forearm, initial encounter: Secondary | ICD-10-CM | POA: Diagnosis not present

## 2023-07-10 DIAGNOSIS — S51812A Laceration without foreign body of left forearm, initial encounter: Secondary | ICD-10-CM | POA: Diagnosis not present

## 2023-07-13 DIAGNOSIS — N179 Acute kidney failure, unspecified: Secondary | ICD-10-CM | POA: Diagnosis not present

## 2023-07-13 DIAGNOSIS — I2510 Atherosclerotic heart disease of native coronary artery without angina pectoris: Secondary | ICD-10-CM | POA: Diagnosis not present

## 2023-07-13 DIAGNOSIS — N189 Chronic kidney disease, unspecified: Secondary | ICD-10-CM | POA: Diagnosis not present

## 2023-07-13 DIAGNOSIS — Z556 Problems related to health literacy: Secondary | ICD-10-CM | POA: Diagnosis not present

## 2023-07-13 DIAGNOSIS — L89612 Pressure ulcer of right heel, stage 2: Secondary | ICD-10-CM | POA: Diagnosis not present

## 2023-07-13 DIAGNOSIS — Z9181 History of falling: Secondary | ICD-10-CM | POA: Diagnosis not present

## 2023-07-13 DIAGNOSIS — E871 Hypo-osmolality and hyponatremia: Secondary | ICD-10-CM | POA: Diagnosis not present

## 2023-07-13 DIAGNOSIS — G8929 Other chronic pain: Secondary | ICD-10-CM | POA: Diagnosis not present

## 2023-07-13 DIAGNOSIS — I129 Hypertensive chronic kidney disease with stage 1 through stage 4 chronic kidney disease, or unspecified chronic kidney disease: Secondary | ICD-10-CM | POA: Diagnosis not present

## 2023-07-13 DIAGNOSIS — E876 Hypokalemia: Secondary | ICD-10-CM | POA: Diagnosis not present

## 2023-07-15 DIAGNOSIS — I129 Hypertensive chronic kidney disease with stage 1 through stage 4 chronic kidney disease, or unspecified chronic kidney disease: Secondary | ICD-10-CM | POA: Diagnosis not present

## 2023-07-15 DIAGNOSIS — Z9181 History of falling: Secondary | ICD-10-CM | POA: Diagnosis not present

## 2023-07-15 DIAGNOSIS — E876 Hypokalemia: Secondary | ICD-10-CM | POA: Diagnosis not present

## 2023-07-15 DIAGNOSIS — L89612 Pressure ulcer of right heel, stage 2: Secondary | ICD-10-CM | POA: Diagnosis not present

## 2023-07-15 DIAGNOSIS — N189 Chronic kidney disease, unspecified: Secondary | ICD-10-CM | POA: Diagnosis not present

## 2023-07-15 DIAGNOSIS — E871 Hypo-osmolality and hyponatremia: Secondary | ICD-10-CM | POA: Diagnosis not present

## 2023-07-15 DIAGNOSIS — I2510 Atherosclerotic heart disease of native coronary artery without angina pectoris: Secondary | ICD-10-CM | POA: Diagnosis not present

## 2023-07-15 DIAGNOSIS — G8929 Other chronic pain: Secondary | ICD-10-CM | POA: Diagnosis not present

## 2023-07-15 DIAGNOSIS — Z556 Problems related to health literacy: Secondary | ICD-10-CM | POA: Diagnosis not present

## 2023-07-15 DIAGNOSIS — N179 Acute kidney failure, unspecified: Secondary | ICD-10-CM | POA: Diagnosis not present

## 2023-07-17 DIAGNOSIS — G8929 Other chronic pain: Secondary | ICD-10-CM | POA: Diagnosis not present

## 2023-07-17 DIAGNOSIS — I129 Hypertensive chronic kidney disease with stage 1 through stage 4 chronic kidney disease, or unspecified chronic kidney disease: Secondary | ICD-10-CM | POA: Diagnosis not present

## 2023-07-17 DIAGNOSIS — Z9181 History of falling: Secondary | ICD-10-CM | POA: Diagnosis not present

## 2023-07-17 DIAGNOSIS — N179 Acute kidney failure, unspecified: Secondary | ICD-10-CM | POA: Diagnosis not present

## 2023-07-17 DIAGNOSIS — I2510 Atherosclerotic heart disease of native coronary artery without angina pectoris: Secondary | ICD-10-CM | POA: Diagnosis not present

## 2023-07-17 DIAGNOSIS — L89612 Pressure ulcer of right heel, stage 2: Secondary | ICD-10-CM | POA: Diagnosis not present

## 2023-07-17 DIAGNOSIS — E871 Hypo-osmolality and hyponatremia: Secondary | ICD-10-CM | POA: Diagnosis not present

## 2023-07-17 DIAGNOSIS — E876 Hypokalemia: Secondary | ICD-10-CM | POA: Diagnosis not present

## 2023-07-17 DIAGNOSIS — Z556 Problems related to health literacy: Secondary | ICD-10-CM | POA: Diagnosis not present

## 2023-07-17 DIAGNOSIS — N189 Chronic kidney disease, unspecified: Secondary | ICD-10-CM | POA: Diagnosis not present

## 2023-07-20 DIAGNOSIS — I2510 Atherosclerotic heart disease of native coronary artery without angina pectoris: Secondary | ICD-10-CM | POA: Diagnosis not present

## 2023-07-20 DIAGNOSIS — I129 Hypertensive chronic kidney disease with stage 1 through stage 4 chronic kidney disease, or unspecified chronic kidney disease: Secondary | ICD-10-CM | POA: Diagnosis not present

## 2023-07-20 DIAGNOSIS — N189 Chronic kidney disease, unspecified: Secondary | ICD-10-CM | POA: Diagnosis not present

## 2023-07-20 DIAGNOSIS — E876 Hypokalemia: Secondary | ICD-10-CM | POA: Diagnosis not present

## 2023-07-20 DIAGNOSIS — N179 Acute kidney failure, unspecified: Secondary | ICD-10-CM | POA: Diagnosis not present

## 2023-07-20 DIAGNOSIS — L89612 Pressure ulcer of right heel, stage 2: Secondary | ICD-10-CM | POA: Diagnosis not present

## 2023-07-20 DIAGNOSIS — G8929 Other chronic pain: Secondary | ICD-10-CM | POA: Diagnosis not present

## 2023-07-20 DIAGNOSIS — Z9181 History of falling: Secondary | ICD-10-CM | POA: Diagnosis not present

## 2023-07-20 DIAGNOSIS — E871 Hypo-osmolality and hyponatremia: Secondary | ICD-10-CM | POA: Diagnosis not present

## 2023-07-20 DIAGNOSIS — Z556 Problems related to health literacy: Secondary | ICD-10-CM | POA: Diagnosis not present

## 2023-07-22 DIAGNOSIS — G8929 Other chronic pain: Secondary | ICD-10-CM | POA: Diagnosis not present

## 2023-07-22 DIAGNOSIS — Z9181 History of falling: Secondary | ICD-10-CM | POA: Diagnosis not present

## 2023-07-22 DIAGNOSIS — I2510 Atherosclerotic heart disease of native coronary artery without angina pectoris: Secondary | ICD-10-CM | POA: Diagnosis not present

## 2023-07-22 DIAGNOSIS — N179 Acute kidney failure, unspecified: Secondary | ICD-10-CM | POA: Diagnosis not present

## 2023-07-22 DIAGNOSIS — I129 Hypertensive chronic kidney disease with stage 1 through stage 4 chronic kidney disease, or unspecified chronic kidney disease: Secondary | ICD-10-CM | POA: Diagnosis not present

## 2023-07-22 DIAGNOSIS — N189 Chronic kidney disease, unspecified: Secondary | ICD-10-CM | POA: Diagnosis not present

## 2023-07-22 DIAGNOSIS — E876 Hypokalemia: Secondary | ICD-10-CM | POA: Diagnosis not present

## 2023-07-22 DIAGNOSIS — L89612 Pressure ulcer of right heel, stage 2: Secondary | ICD-10-CM | POA: Diagnosis not present

## 2023-07-22 DIAGNOSIS — Z556 Problems related to health literacy: Secondary | ICD-10-CM | POA: Diagnosis not present

## 2023-07-22 DIAGNOSIS — E871 Hypo-osmolality and hyponatremia: Secondary | ICD-10-CM | POA: Diagnosis not present

## 2023-07-24 DIAGNOSIS — I2510 Atherosclerotic heart disease of native coronary artery without angina pectoris: Secondary | ICD-10-CM | POA: Diagnosis not present

## 2023-07-24 DIAGNOSIS — L89612 Pressure ulcer of right heel, stage 2: Secondary | ICD-10-CM | POA: Diagnosis not present

## 2023-07-24 DIAGNOSIS — Z9181 History of falling: Secondary | ICD-10-CM | POA: Diagnosis not present

## 2023-07-24 DIAGNOSIS — E876 Hypokalemia: Secondary | ICD-10-CM | POA: Diagnosis not present

## 2023-07-24 DIAGNOSIS — N179 Acute kidney failure, unspecified: Secondary | ICD-10-CM | POA: Diagnosis not present

## 2023-07-24 DIAGNOSIS — E871 Hypo-osmolality and hyponatremia: Secondary | ICD-10-CM | POA: Diagnosis not present

## 2023-07-24 DIAGNOSIS — G8929 Other chronic pain: Secondary | ICD-10-CM | POA: Diagnosis not present

## 2023-07-24 DIAGNOSIS — I129 Hypertensive chronic kidney disease with stage 1 through stage 4 chronic kidney disease, or unspecified chronic kidney disease: Secondary | ICD-10-CM | POA: Diagnosis not present

## 2023-07-24 DIAGNOSIS — Z556 Problems related to health literacy: Secondary | ICD-10-CM | POA: Diagnosis not present

## 2023-07-24 DIAGNOSIS — N189 Chronic kidney disease, unspecified: Secondary | ICD-10-CM | POA: Diagnosis not present

## 2023-07-24 DIAGNOSIS — R109 Unspecified abdominal pain: Secondary | ICD-10-CM | POA: Diagnosis not present

## 2023-07-27 DIAGNOSIS — L89612 Pressure ulcer of right heel, stage 2: Secondary | ICD-10-CM | POA: Diagnosis not present

## 2023-07-27 DIAGNOSIS — G8929 Other chronic pain: Secondary | ICD-10-CM | POA: Diagnosis not present

## 2023-07-27 DIAGNOSIS — Z9181 History of falling: Secondary | ICD-10-CM | POA: Diagnosis not present

## 2023-07-27 DIAGNOSIS — I129 Hypertensive chronic kidney disease with stage 1 through stage 4 chronic kidney disease, or unspecified chronic kidney disease: Secondary | ICD-10-CM | POA: Diagnosis not present

## 2023-07-27 DIAGNOSIS — I2510 Atherosclerotic heart disease of native coronary artery without angina pectoris: Secondary | ICD-10-CM | POA: Diagnosis not present

## 2023-07-27 DIAGNOSIS — E871 Hypo-osmolality and hyponatremia: Secondary | ICD-10-CM | POA: Diagnosis not present

## 2023-07-27 DIAGNOSIS — N189 Chronic kidney disease, unspecified: Secondary | ICD-10-CM | POA: Diagnosis not present

## 2023-07-27 DIAGNOSIS — N179 Acute kidney failure, unspecified: Secondary | ICD-10-CM | POA: Diagnosis not present

## 2023-07-27 DIAGNOSIS — E876 Hypokalemia: Secondary | ICD-10-CM | POA: Diagnosis not present

## 2023-07-27 DIAGNOSIS — Z556 Problems related to health literacy: Secondary | ICD-10-CM | POA: Diagnosis not present

## 2023-07-29 DIAGNOSIS — N179 Acute kidney failure, unspecified: Secondary | ICD-10-CM | POA: Diagnosis not present

## 2023-07-29 DIAGNOSIS — Z9181 History of falling: Secondary | ICD-10-CM | POA: Diagnosis not present

## 2023-07-29 DIAGNOSIS — Z556 Problems related to health literacy: Secondary | ICD-10-CM | POA: Diagnosis not present

## 2023-07-29 DIAGNOSIS — L89612 Pressure ulcer of right heel, stage 2: Secondary | ICD-10-CM | POA: Diagnosis not present

## 2023-07-29 DIAGNOSIS — G8929 Other chronic pain: Secondary | ICD-10-CM | POA: Diagnosis not present

## 2023-07-29 DIAGNOSIS — I129 Hypertensive chronic kidney disease with stage 1 through stage 4 chronic kidney disease, or unspecified chronic kidney disease: Secondary | ICD-10-CM | POA: Diagnosis not present

## 2023-07-29 DIAGNOSIS — E876 Hypokalemia: Secondary | ICD-10-CM | POA: Diagnosis not present

## 2023-07-29 DIAGNOSIS — I2510 Atherosclerotic heart disease of native coronary artery without angina pectoris: Secondary | ICD-10-CM | POA: Diagnosis not present

## 2023-07-29 DIAGNOSIS — E871 Hypo-osmolality and hyponatremia: Secondary | ICD-10-CM | POA: Diagnosis not present

## 2023-07-29 DIAGNOSIS — N189 Chronic kidney disease, unspecified: Secondary | ICD-10-CM | POA: Diagnosis not present

## 2023-07-31 DIAGNOSIS — N179 Acute kidney failure, unspecified: Secondary | ICD-10-CM | POA: Diagnosis not present

## 2023-07-31 DIAGNOSIS — E871 Hypo-osmolality and hyponatremia: Secondary | ICD-10-CM | POA: Diagnosis not present

## 2023-07-31 DIAGNOSIS — I2510 Atherosclerotic heart disease of native coronary artery without angina pectoris: Secondary | ICD-10-CM | POA: Diagnosis not present

## 2023-07-31 DIAGNOSIS — Z556 Problems related to health literacy: Secondary | ICD-10-CM | POA: Diagnosis not present

## 2023-07-31 DIAGNOSIS — Z9181 History of falling: Secondary | ICD-10-CM | POA: Diagnosis not present

## 2023-07-31 DIAGNOSIS — I129 Hypertensive chronic kidney disease with stage 1 through stage 4 chronic kidney disease, or unspecified chronic kidney disease: Secondary | ICD-10-CM | POA: Diagnosis not present

## 2023-07-31 DIAGNOSIS — L89612 Pressure ulcer of right heel, stage 2: Secondary | ICD-10-CM | POA: Diagnosis not present

## 2023-07-31 DIAGNOSIS — E876 Hypokalemia: Secondary | ICD-10-CM | POA: Diagnosis not present

## 2023-07-31 DIAGNOSIS — N189 Chronic kidney disease, unspecified: Secondary | ICD-10-CM | POA: Diagnosis not present

## 2023-07-31 DIAGNOSIS — G8929 Other chronic pain: Secondary | ICD-10-CM | POA: Diagnosis not present

## 2023-08-03 DIAGNOSIS — Z9181 History of falling: Secondary | ICD-10-CM | POA: Diagnosis not present

## 2023-08-03 DIAGNOSIS — E871 Hypo-osmolality and hyponatremia: Secondary | ICD-10-CM | POA: Diagnosis not present

## 2023-08-03 DIAGNOSIS — Z556 Problems related to health literacy: Secondary | ICD-10-CM | POA: Diagnosis not present

## 2023-08-03 DIAGNOSIS — I2510 Atherosclerotic heart disease of native coronary artery without angina pectoris: Secondary | ICD-10-CM | POA: Diagnosis not present

## 2023-08-03 DIAGNOSIS — I129 Hypertensive chronic kidney disease with stage 1 through stage 4 chronic kidney disease, or unspecified chronic kidney disease: Secondary | ICD-10-CM | POA: Diagnosis not present

## 2023-08-03 DIAGNOSIS — N189 Chronic kidney disease, unspecified: Secondary | ICD-10-CM | POA: Diagnosis not present

## 2023-08-03 DIAGNOSIS — N179 Acute kidney failure, unspecified: Secondary | ICD-10-CM | POA: Diagnosis not present

## 2023-08-03 DIAGNOSIS — G8929 Other chronic pain: Secondary | ICD-10-CM | POA: Diagnosis not present

## 2023-08-03 DIAGNOSIS — L89612 Pressure ulcer of right heel, stage 2: Secondary | ICD-10-CM | POA: Diagnosis not present

## 2023-08-03 DIAGNOSIS — E876 Hypokalemia: Secondary | ICD-10-CM | POA: Diagnosis not present

## 2023-08-05 DIAGNOSIS — L89612 Pressure ulcer of right heel, stage 2: Secondary | ICD-10-CM | POA: Diagnosis not present

## 2023-08-05 DIAGNOSIS — N189 Chronic kidney disease, unspecified: Secondary | ICD-10-CM | POA: Diagnosis not present

## 2023-08-05 DIAGNOSIS — N179 Acute kidney failure, unspecified: Secondary | ICD-10-CM | POA: Diagnosis not present

## 2023-08-05 DIAGNOSIS — E876 Hypokalemia: Secondary | ICD-10-CM | POA: Diagnosis not present

## 2023-08-05 DIAGNOSIS — Z556 Problems related to health literacy: Secondary | ICD-10-CM | POA: Diagnosis not present

## 2023-08-05 DIAGNOSIS — I2510 Atherosclerotic heart disease of native coronary artery without angina pectoris: Secondary | ICD-10-CM | POA: Diagnosis not present

## 2023-08-05 DIAGNOSIS — Z9181 History of falling: Secondary | ICD-10-CM | POA: Diagnosis not present

## 2023-08-05 DIAGNOSIS — G8929 Other chronic pain: Secondary | ICD-10-CM | POA: Diagnosis not present

## 2023-08-05 DIAGNOSIS — I129 Hypertensive chronic kidney disease with stage 1 through stage 4 chronic kidney disease, or unspecified chronic kidney disease: Secondary | ICD-10-CM | POA: Diagnosis not present

## 2023-08-05 DIAGNOSIS — E871 Hypo-osmolality and hyponatremia: Secondary | ICD-10-CM | POA: Diagnosis not present

## 2023-08-08 DIAGNOSIS — I2510 Atherosclerotic heart disease of native coronary artery without angina pectoris: Secondary | ICD-10-CM | POA: Diagnosis not present

## 2023-08-08 DIAGNOSIS — N179 Acute kidney failure, unspecified: Secondary | ICD-10-CM | POA: Diagnosis not present

## 2023-08-08 DIAGNOSIS — E871 Hypo-osmolality and hyponatremia: Secondary | ICD-10-CM | POA: Diagnosis not present

## 2023-08-08 DIAGNOSIS — N189 Chronic kidney disease, unspecified: Secondary | ICD-10-CM | POA: Diagnosis not present

## 2023-08-08 DIAGNOSIS — G8929 Other chronic pain: Secondary | ICD-10-CM | POA: Diagnosis not present

## 2023-08-08 DIAGNOSIS — I129 Hypertensive chronic kidney disease with stage 1 through stage 4 chronic kidney disease, or unspecified chronic kidney disease: Secondary | ICD-10-CM | POA: Diagnosis not present

## 2023-08-08 DIAGNOSIS — Z9181 History of falling: Secondary | ICD-10-CM | POA: Diagnosis not present

## 2023-08-08 DIAGNOSIS — Z556 Problems related to health literacy: Secondary | ICD-10-CM | POA: Diagnosis not present

## 2023-08-08 DIAGNOSIS — L89612 Pressure ulcer of right heel, stage 2: Secondary | ICD-10-CM | POA: Diagnosis not present

## 2023-08-08 DIAGNOSIS — E876 Hypokalemia: Secondary | ICD-10-CM | POA: Diagnosis not present

## 2023-08-10 DIAGNOSIS — I129 Hypertensive chronic kidney disease with stage 1 through stage 4 chronic kidney disease, or unspecified chronic kidney disease: Secondary | ICD-10-CM | POA: Diagnosis not present

## 2023-08-10 DIAGNOSIS — E871 Hypo-osmolality and hyponatremia: Secondary | ICD-10-CM | POA: Diagnosis not present

## 2023-08-10 DIAGNOSIS — G8929 Other chronic pain: Secondary | ICD-10-CM | POA: Diagnosis not present

## 2023-08-10 DIAGNOSIS — Z9181 History of falling: Secondary | ICD-10-CM | POA: Diagnosis not present

## 2023-08-10 DIAGNOSIS — N189 Chronic kidney disease, unspecified: Secondary | ICD-10-CM | POA: Diagnosis not present

## 2023-08-10 DIAGNOSIS — I2510 Atherosclerotic heart disease of native coronary artery without angina pectoris: Secondary | ICD-10-CM | POA: Diagnosis not present

## 2023-08-10 DIAGNOSIS — L89612 Pressure ulcer of right heel, stage 2: Secondary | ICD-10-CM | POA: Diagnosis not present

## 2023-08-10 DIAGNOSIS — Z556 Problems related to health literacy: Secondary | ICD-10-CM | POA: Diagnosis not present

## 2023-08-10 DIAGNOSIS — N179 Acute kidney failure, unspecified: Secondary | ICD-10-CM | POA: Diagnosis not present

## 2023-08-10 DIAGNOSIS — E876 Hypokalemia: Secondary | ICD-10-CM | POA: Diagnosis not present

## 2023-08-12 DIAGNOSIS — N189 Chronic kidney disease, unspecified: Secondary | ICD-10-CM | POA: Diagnosis not present

## 2023-08-12 DIAGNOSIS — G8929 Other chronic pain: Secondary | ICD-10-CM | POA: Diagnosis not present

## 2023-08-12 DIAGNOSIS — E876 Hypokalemia: Secondary | ICD-10-CM | POA: Diagnosis not present

## 2023-08-12 DIAGNOSIS — E871 Hypo-osmolality and hyponatremia: Secondary | ICD-10-CM | POA: Diagnosis not present

## 2023-08-12 DIAGNOSIS — I129 Hypertensive chronic kidney disease with stage 1 through stage 4 chronic kidney disease, or unspecified chronic kidney disease: Secondary | ICD-10-CM | POA: Diagnosis not present

## 2023-08-12 DIAGNOSIS — N179 Acute kidney failure, unspecified: Secondary | ICD-10-CM | POA: Diagnosis not present

## 2023-08-12 DIAGNOSIS — Z556 Problems related to health literacy: Secondary | ICD-10-CM | POA: Diagnosis not present

## 2023-08-12 DIAGNOSIS — I2510 Atherosclerotic heart disease of native coronary artery without angina pectoris: Secondary | ICD-10-CM | POA: Diagnosis not present

## 2023-08-12 DIAGNOSIS — Z9181 History of falling: Secondary | ICD-10-CM | POA: Diagnosis not present

## 2023-08-12 DIAGNOSIS — L89612 Pressure ulcer of right heel, stage 2: Secondary | ICD-10-CM | POA: Diagnosis not present

## 2023-08-14 DIAGNOSIS — E876 Hypokalemia: Secondary | ICD-10-CM | POA: Diagnosis not present

## 2023-08-14 DIAGNOSIS — N189 Chronic kidney disease, unspecified: Secondary | ICD-10-CM | POA: Diagnosis not present

## 2023-08-14 DIAGNOSIS — Z9181 History of falling: Secondary | ICD-10-CM | POA: Diagnosis not present

## 2023-08-14 DIAGNOSIS — G8929 Other chronic pain: Secondary | ICD-10-CM | POA: Diagnosis not present

## 2023-08-14 DIAGNOSIS — N179 Acute kidney failure, unspecified: Secondary | ICD-10-CM | POA: Diagnosis not present

## 2023-08-14 DIAGNOSIS — Z556 Problems related to health literacy: Secondary | ICD-10-CM | POA: Diagnosis not present

## 2023-08-14 DIAGNOSIS — I129 Hypertensive chronic kidney disease with stage 1 through stage 4 chronic kidney disease, or unspecified chronic kidney disease: Secondary | ICD-10-CM | POA: Diagnosis not present

## 2023-08-14 DIAGNOSIS — I2510 Atherosclerotic heart disease of native coronary artery without angina pectoris: Secondary | ICD-10-CM | POA: Diagnosis not present

## 2023-08-14 DIAGNOSIS — E871 Hypo-osmolality and hyponatremia: Secondary | ICD-10-CM | POA: Diagnosis not present

## 2023-08-14 DIAGNOSIS — L89612 Pressure ulcer of right heel, stage 2: Secondary | ICD-10-CM | POA: Diagnosis not present

## 2023-08-17 DIAGNOSIS — N179 Acute kidney failure, unspecified: Secondary | ICD-10-CM | POA: Diagnosis not present

## 2023-08-17 DIAGNOSIS — L89612 Pressure ulcer of right heel, stage 2: Secondary | ICD-10-CM | POA: Diagnosis not present

## 2023-08-17 DIAGNOSIS — G8929 Other chronic pain: Secondary | ICD-10-CM | POA: Diagnosis not present

## 2023-08-17 DIAGNOSIS — I2510 Atherosclerotic heart disease of native coronary artery without angina pectoris: Secondary | ICD-10-CM | POA: Diagnosis not present

## 2023-08-17 DIAGNOSIS — Z556 Problems related to health literacy: Secondary | ICD-10-CM | POA: Diagnosis not present

## 2023-08-17 DIAGNOSIS — N189 Chronic kidney disease, unspecified: Secondary | ICD-10-CM | POA: Diagnosis not present

## 2023-08-17 DIAGNOSIS — E871 Hypo-osmolality and hyponatremia: Secondary | ICD-10-CM | POA: Diagnosis not present

## 2023-08-17 DIAGNOSIS — I129 Hypertensive chronic kidney disease with stage 1 through stage 4 chronic kidney disease, or unspecified chronic kidney disease: Secondary | ICD-10-CM | POA: Diagnosis not present

## 2023-08-17 DIAGNOSIS — Z9181 History of falling: Secondary | ICD-10-CM | POA: Diagnosis not present

## 2023-08-17 DIAGNOSIS — E876 Hypokalemia: Secondary | ICD-10-CM | POA: Diagnosis not present

## 2023-08-19 DIAGNOSIS — N179 Acute kidney failure, unspecified: Secondary | ICD-10-CM | POA: Diagnosis not present

## 2023-08-19 DIAGNOSIS — G8929 Other chronic pain: Secondary | ICD-10-CM | POA: Diagnosis not present

## 2023-08-19 DIAGNOSIS — E876 Hypokalemia: Secondary | ICD-10-CM | POA: Diagnosis not present

## 2023-08-19 DIAGNOSIS — Z556 Problems related to health literacy: Secondary | ICD-10-CM | POA: Diagnosis not present

## 2023-08-19 DIAGNOSIS — Z9181 History of falling: Secondary | ICD-10-CM | POA: Diagnosis not present

## 2023-08-19 DIAGNOSIS — L89612 Pressure ulcer of right heel, stage 2: Secondary | ICD-10-CM | POA: Diagnosis not present

## 2023-08-19 DIAGNOSIS — E871 Hypo-osmolality and hyponatremia: Secondary | ICD-10-CM | POA: Diagnosis not present

## 2023-08-19 DIAGNOSIS — N189 Chronic kidney disease, unspecified: Secondary | ICD-10-CM | POA: Diagnosis not present

## 2023-08-19 DIAGNOSIS — I2510 Atherosclerotic heart disease of native coronary artery without angina pectoris: Secondary | ICD-10-CM | POA: Diagnosis not present

## 2023-08-19 DIAGNOSIS — I129 Hypertensive chronic kidney disease with stage 1 through stage 4 chronic kidney disease, or unspecified chronic kidney disease: Secondary | ICD-10-CM | POA: Diagnosis not present

## 2023-08-22 DIAGNOSIS — L89612 Pressure ulcer of right heel, stage 2: Secondary | ICD-10-CM | POA: Diagnosis not present

## 2023-08-22 DIAGNOSIS — Z556 Problems related to health literacy: Secondary | ICD-10-CM | POA: Diagnosis not present

## 2023-08-22 DIAGNOSIS — Z9181 History of falling: Secondary | ICD-10-CM | POA: Diagnosis not present

## 2023-08-22 DIAGNOSIS — I129 Hypertensive chronic kidney disease with stage 1 through stage 4 chronic kidney disease, or unspecified chronic kidney disease: Secondary | ICD-10-CM | POA: Diagnosis not present

## 2023-08-22 DIAGNOSIS — I2510 Atherosclerotic heart disease of native coronary artery without angina pectoris: Secondary | ICD-10-CM | POA: Diagnosis not present

## 2023-08-22 DIAGNOSIS — E876 Hypokalemia: Secondary | ICD-10-CM | POA: Diagnosis not present

## 2023-08-22 DIAGNOSIS — N189 Chronic kidney disease, unspecified: Secondary | ICD-10-CM | POA: Diagnosis not present

## 2023-08-22 DIAGNOSIS — E871 Hypo-osmolality and hyponatremia: Secondary | ICD-10-CM | POA: Diagnosis not present

## 2023-08-22 DIAGNOSIS — G8929 Other chronic pain: Secondary | ICD-10-CM | POA: Diagnosis not present

## 2023-08-22 DIAGNOSIS — N179 Acute kidney failure, unspecified: Secondary | ICD-10-CM | POA: Diagnosis not present

## 2023-08-26 DIAGNOSIS — N189 Chronic kidney disease, unspecified: Secondary | ICD-10-CM | POA: Diagnosis not present

## 2023-08-26 DIAGNOSIS — Z556 Problems related to health literacy: Secondary | ICD-10-CM | POA: Diagnosis not present

## 2023-08-26 DIAGNOSIS — I129 Hypertensive chronic kidney disease with stage 1 through stage 4 chronic kidney disease, or unspecified chronic kidney disease: Secondary | ICD-10-CM | POA: Diagnosis not present

## 2023-08-26 DIAGNOSIS — E871 Hypo-osmolality and hyponatremia: Secondary | ICD-10-CM | POA: Diagnosis not present

## 2023-08-26 DIAGNOSIS — I2510 Atherosclerotic heart disease of native coronary artery without angina pectoris: Secondary | ICD-10-CM | POA: Diagnosis not present

## 2023-08-26 DIAGNOSIS — L89612 Pressure ulcer of right heel, stage 2: Secondary | ICD-10-CM | POA: Diagnosis not present

## 2023-08-26 DIAGNOSIS — N179 Acute kidney failure, unspecified: Secondary | ICD-10-CM | POA: Diagnosis not present

## 2023-08-26 DIAGNOSIS — E876 Hypokalemia: Secondary | ICD-10-CM | POA: Diagnosis not present

## 2023-08-26 DIAGNOSIS — G8929 Other chronic pain: Secondary | ICD-10-CM | POA: Diagnosis not present

## 2023-08-26 DIAGNOSIS — Z9181 History of falling: Secondary | ICD-10-CM | POA: Diagnosis not present

## 2023-08-29 DIAGNOSIS — N189 Chronic kidney disease, unspecified: Secondary | ICD-10-CM | POA: Diagnosis not present

## 2023-08-29 DIAGNOSIS — L89612 Pressure ulcer of right heel, stage 2: Secondary | ICD-10-CM | POA: Diagnosis not present

## 2023-08-29 DIAGNOSIS — Z9181 History of falling: Secondary | ICD-10-CM | POA: Diagnosis not present

## 2023-08-29 DIAGNOSIS — G8929 Other chronic pain: Secondary | ICD-10-CM | POA: Diagnosis not present

## 2023-08-29 DIAGNOSIS — E871 Hypo-osmolality and hyponatremia: Secondary | ICD-10-CM | POA: Diagnosis not present

## 2023-08-29 DIAGNOSIS — Z556 Problems related to health literacy: Secondary | ICD-10-CM | POA: Diagnosis not present

## 2023-08-29 DIAGNOSIS — I129 Hypertensive chronic kidney disease with stage 1 through stage 4 chronic kidney disease, or unspecified chronic kidney disease: Secondary | ICD-10-CM | POA: Diagnosis not present

## 2023-08-29 DIAGNOSIS — N179 Acute kidney failure, unspecified: Secondary | ICD-10-CM | POA: Diagnosis not present

## 2023-08-29 DIAGNOSIS — E876 Hypokalemia: Secondary | ICD-10-CM | POA: Diagnosis not present

## 2023-08-29 DIAGNOSIS — I2510 Atherosclerotic heart disease of native coronary artery without angina pectoris: Secondary | ICD-10-CM | POA: Diagnosis not present

## 2023-08-31 DIAGNOSIS — N179 Acute kidney failure, unspecified: Secondary | ICD-10-CM | POA: Diagnosis not present

## 2023-08-31 DIAGNOSIS — E871 Hypo-osmolality and hyponatremia: Secondary | ICD-10-CM | POA: Diagnosis not present

## 2023-08-31 DIAGNOSIS — Z556 Problems related to health literacy: Secondary | ICD-10-CM | POA: Diagnosis not present

## 2023-08-31 DIAGNOSIS — L89612 Pressure ulcer of right heel, stage 2: Secondary | ICD-10-CM | POA: Diagnosis not present

## 2023-08-31 DIAGNOSIS — I2510 Atherosclerotic heart disease of native coronary artery without angina pectoris: Secondary | ICD-10-CM | POA: Diagnosis not present

## 2023-08-31 DIAGNOSIS — I129 Hypertensive chronic kidney disease with stage 1 through stage 4 chronic kidney disease, or unspecified chronic kidney disease: Secondary | ICD-10-CM | POA: Diagnosis not present

## 2023-08-31 DIAGNOSIS — N189 Chronic kidney disease, unspecified: Secondary | ICD-10-CM | POA: Diagnosis not present

## 2023-08-31 DIAGNOSIS — G8929 Other chronic pain: Secondary | ICD-10-CM | POA: Diagnosis not present

## 2023-08-31 DIAGNOSIS — Z9181 History of falling: Secondary | ICD-10-CM | POA: Diagnosis not present

## 2023-08-31 DIAGNOSIS — E876 Hypokalemia: Secondary | ICD-10-CM | POA: Diagnosis not present

## 2023-09-06 IMAGING — MR MR HEAD WO/W CM
15 series · 48 of 48 positions shown · IV contrast (8ml Gadavist)
Comparison: Head CT 05/30/2021.  Orbit CT 03/15/2008.

CLINICAL DATA: Headache and vomiting for 3 days. History of
metastatic prostate cancer.

EXAM:
MRI HEAD WITHOUT AND WITH CONTRAST
TECHNIQUE: Multiplanar, multiecho pulse sequences of the brain and surrounding
structures were obtained without and with intravenous contrast.
CONTRAST:  8mL GADAVIST GADOBUTROL 1 MMOL/ML IV SOLN

[Series 5: ax dwi_tracew · axial · 3.0mm · 0.65mm/px · z∈[-114,+40]mm · 4 of 48 slices shown]
[im 1/48]
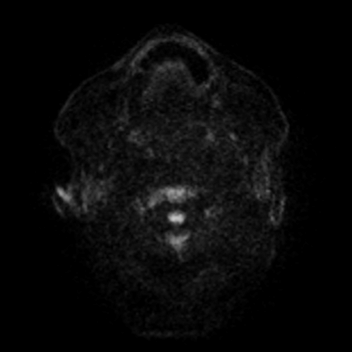
[im 16/48]
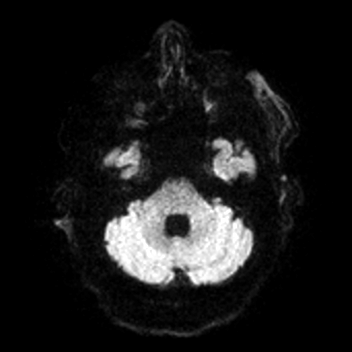
[im 32/48]
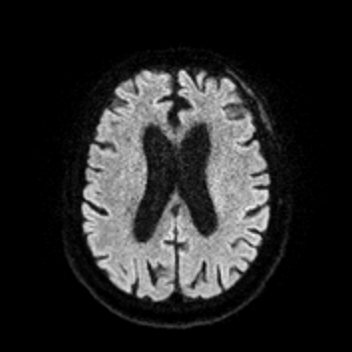
[im 48/48]
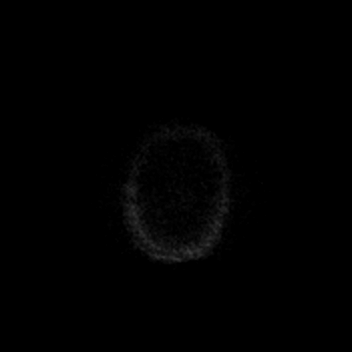

[Series 6: ax dwi_adc · axial · 3.0mm · 0.65mm/px · z∈[-114,+40]mm · 3 of 48 slices shown]
[im 1/48]
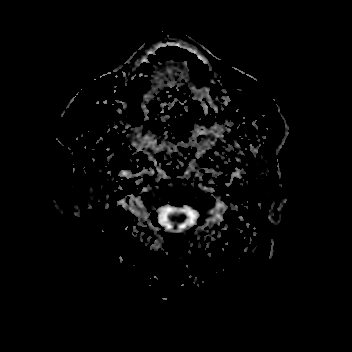
[im 24/48]
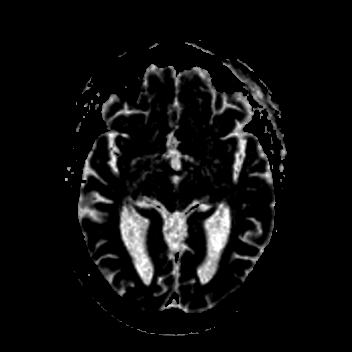
[im 48/48]
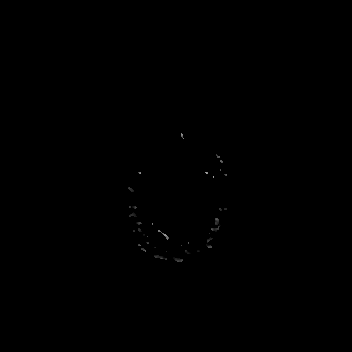

[Series 7: cor dwi_tracew · coronal · 5.0mm · 0.68mm/px · 2 of 40 slices shown]
[im 1/40]
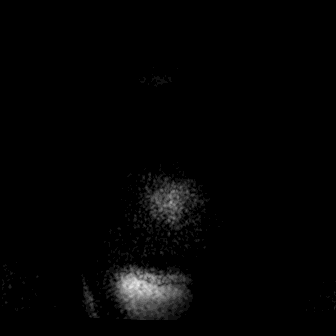
[im 40/40]
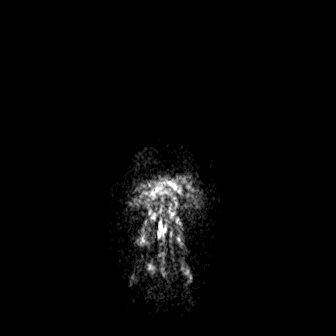

[Series 8: cor dwi_adc · coronal · 5.0mm · 0.68mm/px · 2 of 40 slices shown]
[im 1/40]
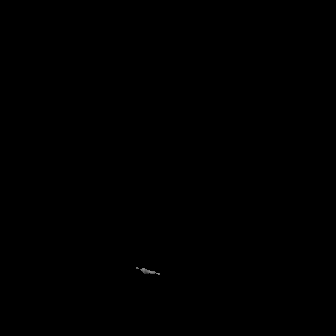
[im 40/40]
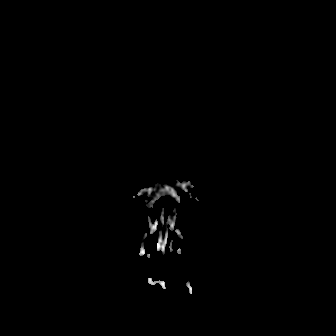

[Series 9: T1 · sagittal · 5.0mm · 0.62mm/px · 1 of 23 slices shown (1 of 2)]
[im 1/23]
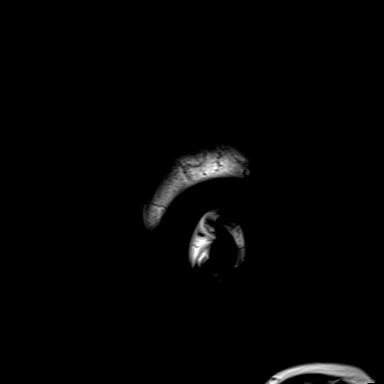

[Series 10: T2 · axial · 5.0mm · 0.53mm/px · 1 of 26 slices shown]
[im 1/26]
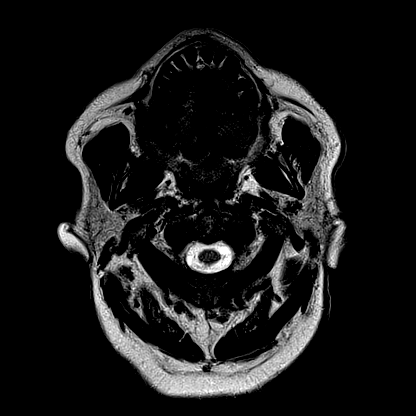

[Series 11: mag_images · axial · 3.0mm · 0.90mm/px · z∈[-124,+52]mm · 3 of 60 slices shown]
[im 1/60]
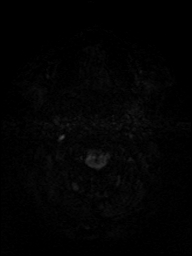
[im 30/60]
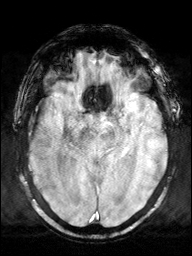
[im 60/60]
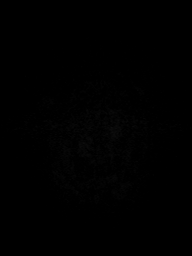

[Series 12: pha_images · axial · 3.0mm · 0.90mm/px · z∈[-121,+52]mm · 3 of 58 slices shown]
[im 1/58]
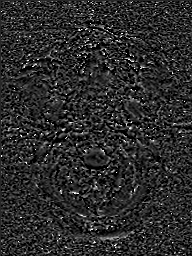
[im 29/58]
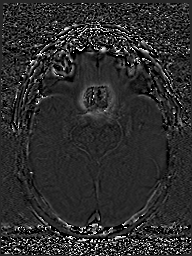
[im 58/58]
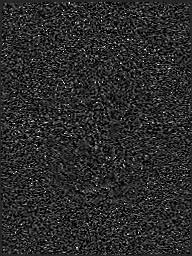

[Series 13: swi_images · axial · 3.0mm · 0.90mm/px · z∈[-124,+52]mm · 3 of 60 slices shown]
[im 1/60]
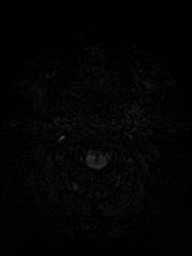
[im 30/60]
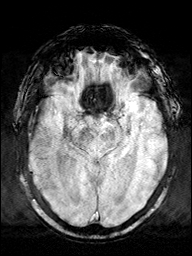
[im 60/60]
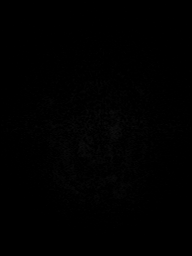

[Series 15: FLAIR · axial · 3.0mm · 0.53mm/px · z∈[-117,+44]mm · 3 of 55 slices shown]
[im 1/55]
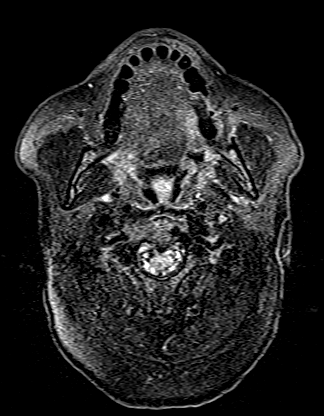
[im 28/55]
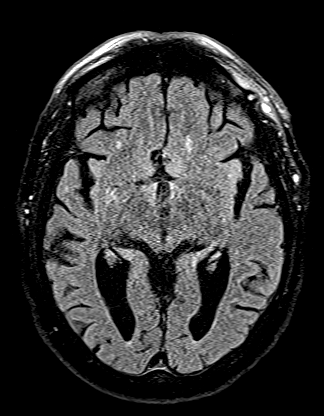
[im 55/55]
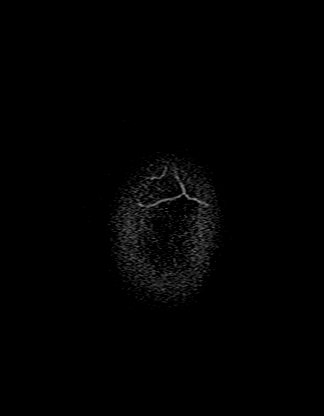

[Series 16: T1 · axial · 1.0mm · 0.98mm/px · z∈[-123,+51]mm · 9 of 176 slices shown (2 of 2)]
[im 1/176]
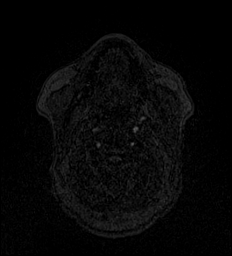
[im 22/176]
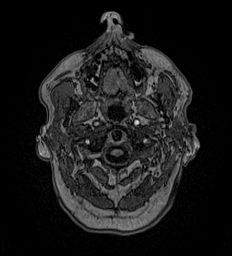
[im 44/176]
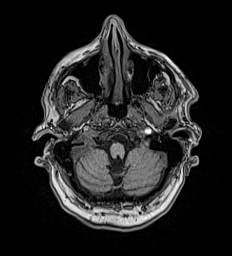
[im 66/176]
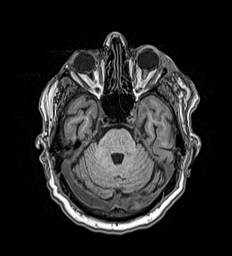
[im 88/176]
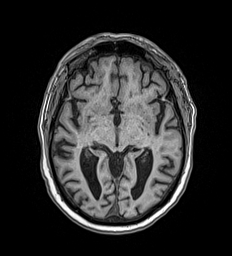
[im 110/176]
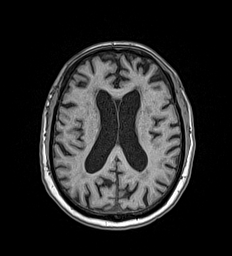
[im 132/176]
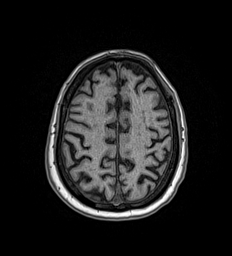
[im 154/176]
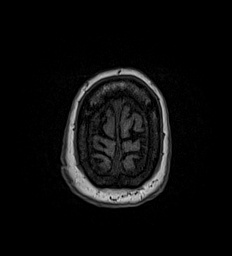
[im 176/176]
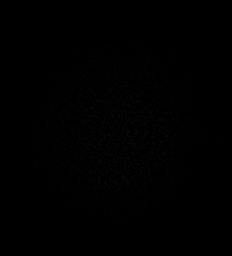

[Series 17: T2 post-contrast · coronal · 5.0mm · 0.57mm/px · 2 of 29 slices shown]
[im 1/29]
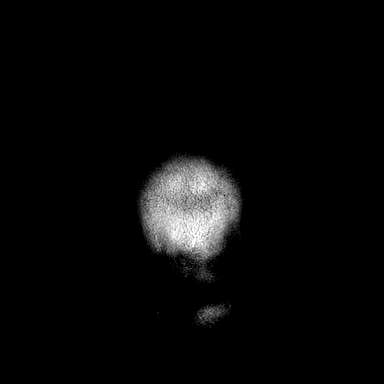
[im 29/29]
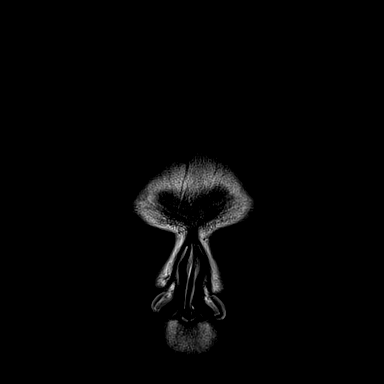

[Series 18: T1 post-contrast · axial · 1.0mm · 0.98mm/px · z∈[-123,+51]mm · 9 of 176 slices shown (1 of 3)]
[im 1/176]
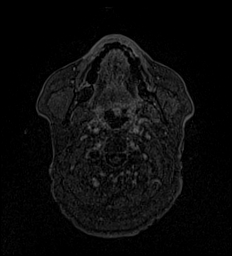
[im 22/176]
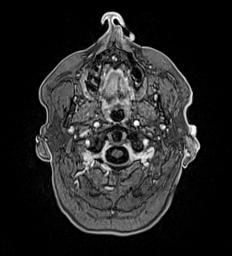
[im 44/176]
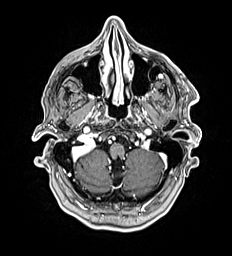
[im 66/176]
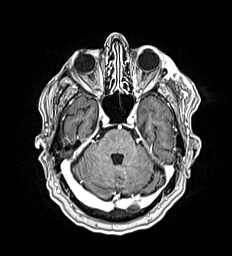
[im 88/176]
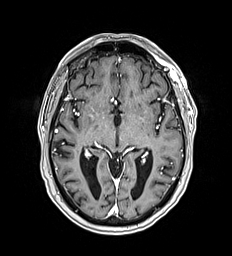
[im 110/176]
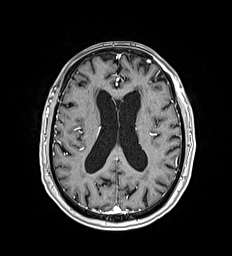
[im 132/176]
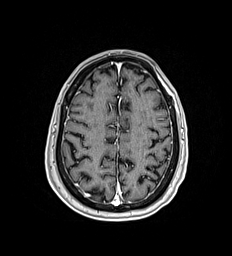
[im 154/176]
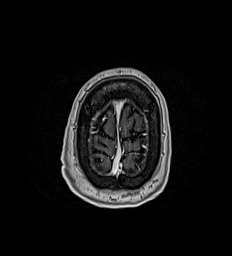
[im 176/176]
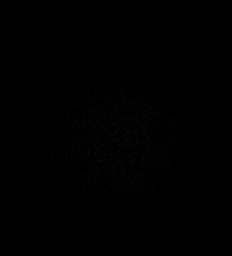

[Series 19: T1 post-contrast · coronal · 5.0mm · 0.57mm/px · 2 of 29 slices shown (2 of 3)]
[im 1/29]
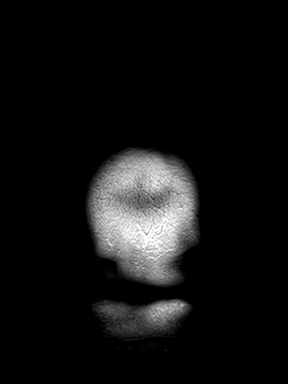
[im 29/29]
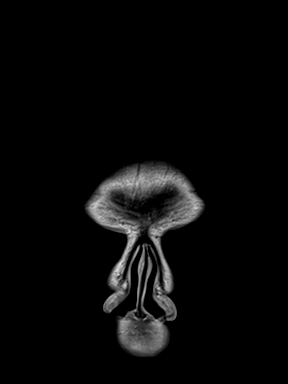

[Series 20: T1 post-contrast · sagittal · 5.0mm · 0.62mm/px · 1 of 23 slices shown (3 of 3)]
[im 1/23]
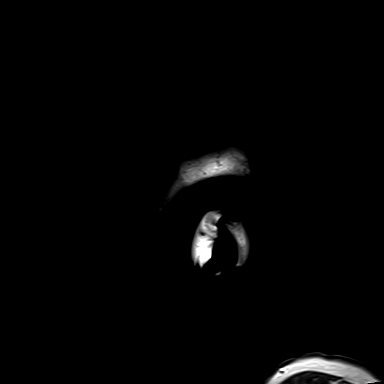

[48 of 48 positions shown; findings below may reference images not displayed]

FINDINGS: Brain: There is no evidence of an acute infarct, intracranial
hemorrhage, mass, midline shift, or extra-axial fluid collection. T2
hyperintensities in the cerebral white matter bilaterally are
nonspecific but compatible with mild-to-moderate chronic small
vessel ischemic disease. There is mild-to-moderate cerebral atrophy.
No abnormal enhancement is identified.

Vascular: Major intracranial vascular flow voids are preserved.

Skull and upper cervical spine: There is sheet like/infiltrative T2
hyperintense, avidly enhancing soft tissue in the left frontal scalp
with erosion of the outer table of the left frontal skull as seen on
CT. This extends inferiorly into the left lateral periorbital soft
tissues and into the lateral aspect of the left orbit, inseparable
from the lacrimal gland, and extends to the skin in this region.
This appears to be longstanding, as abnormal soft tissue was also
present in these regions on the 7554 CT of the orbits. There are
scattered sclerotic lesions in the skull and included upper cervical
spine which do not demonstrate substantial enhancement.

Sinuses/Orbits: Bilateral cataract extraction. Clear paranasal
sinuses. Trace right mastoid fluid.

Other: None.
IMPRESSION: 1. No evidence of acute intracranial abnormality or intracranial
metastatic disease.
2. Mild-to-moderate chronic small vessel ischemic disease and
cerebral atrophy.
3. Abnormal enhancing soft tissue in the left periorbital region and
left frontal scalp with erosion of the outer table of the left
frontal skull which is longstanding based on a 7554 CT. Per the
electronic medical record, the patient has a history of a
neurofibroma of the lateral periorbital area, and the appearance on
MRI would be more consistent with this type of entity rather than a
prostate cancer metastasis.
4. Scattered sclerotic lesions in the skull and upper cervical spine
consistent with known prostate cancer metastases.

## 2023-09-06 IMAGING — CT CT HEAD W/O CM
4 series · 16 of 47 positions shown, 18 images · non-contrast
Comparison: January 03, 2021.

CLINICAL DATA: Headaches.  Emesis.  History of prostate cancer.



[Series 2: head wo · axial · 0.41mm/px · z∈[-110,+10]mm · 7 of 34 slices shown, 9 images]
[im 5/34  brain]
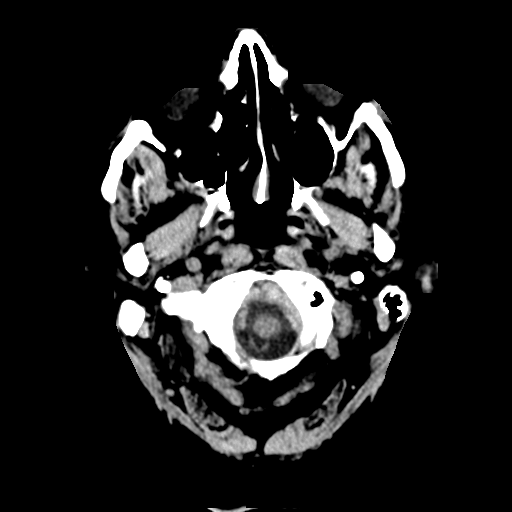
[im 5/34  bone]
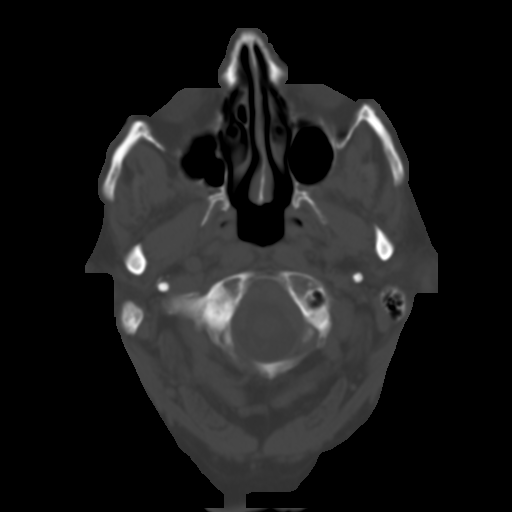
[im 9/34  brain]
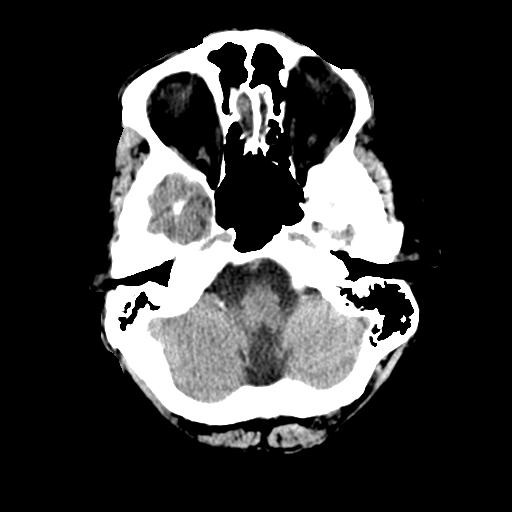
[im 13/34  brain]
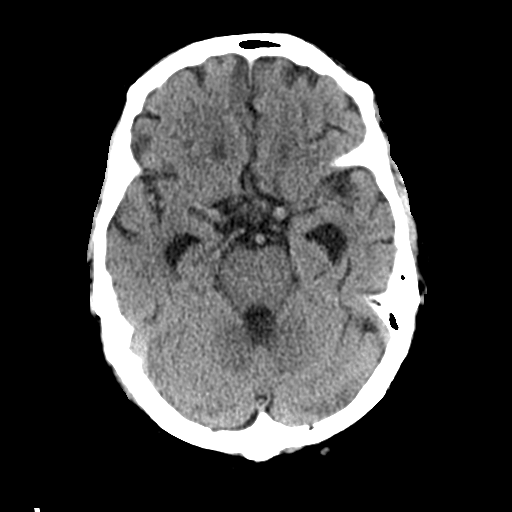
[im 17/34  brain]
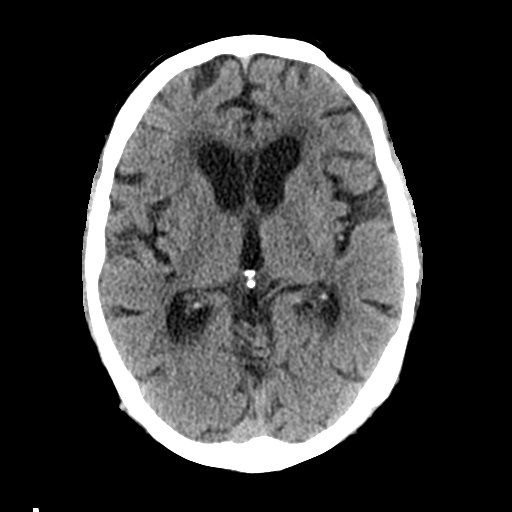
[im 21/34  brain]
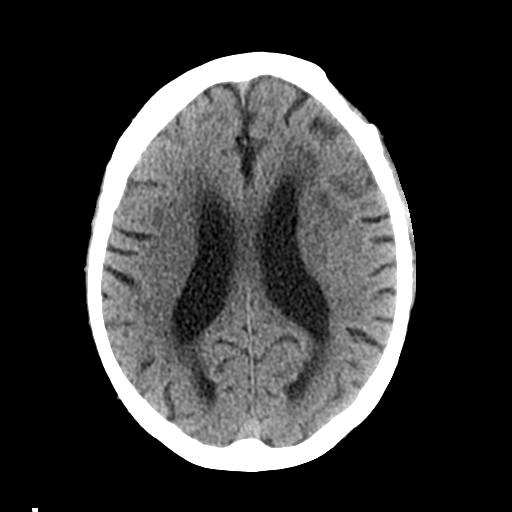
[im 21/34  bone]
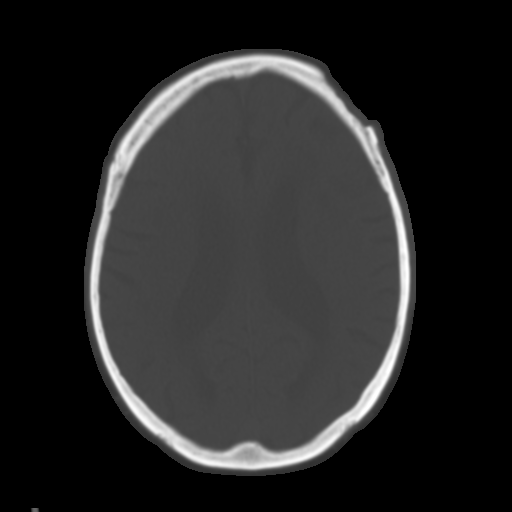
[im 25/34  brain]
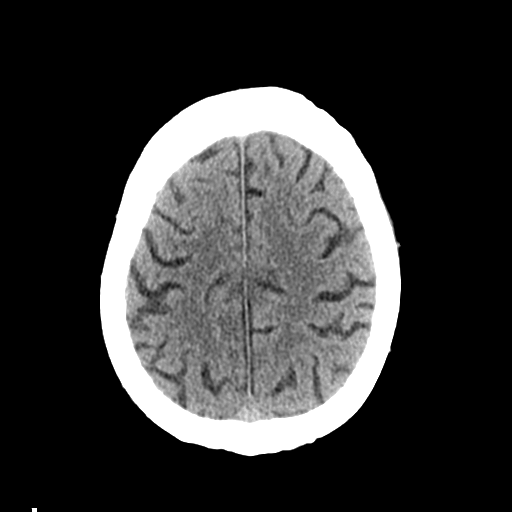
[im 29/34  brain]
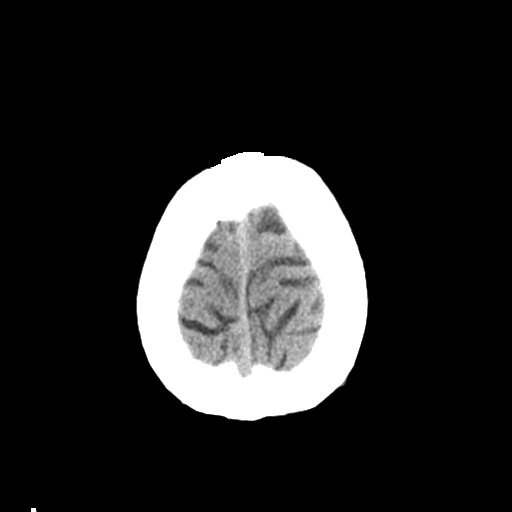

[Series 3: head bone · axial · 0.41mm/px · z∈[-114,-80]mm · 3 of 85 slices shown]
[im 9/85  bone]
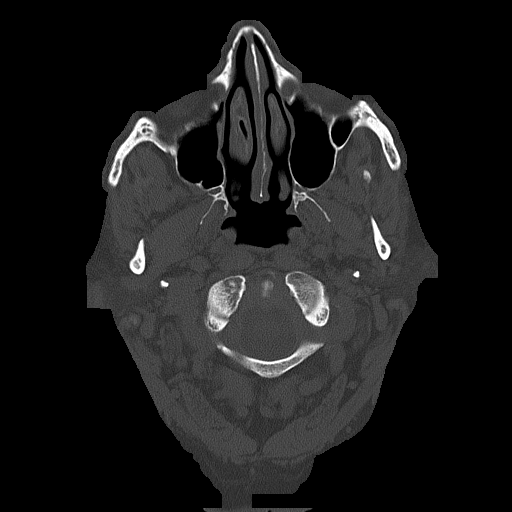
[im 17/85  bone]
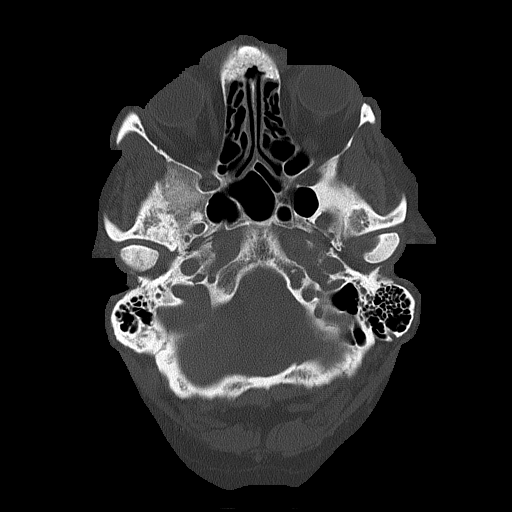
[im 26/85  bone]
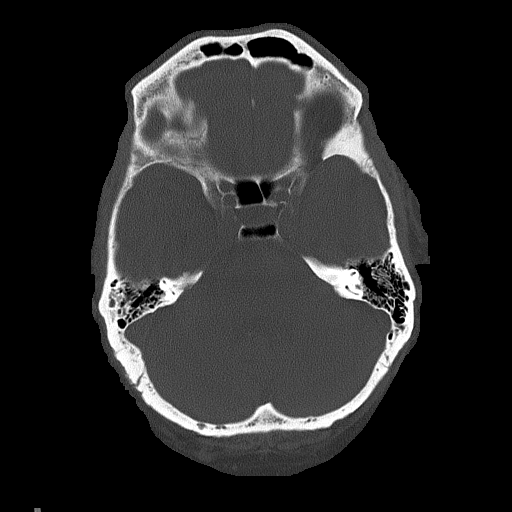

[Series 4: coronal soft tissue · coronal · 0.33mm/px · 3 of 69 slices shown]
[im 23/69  brain]
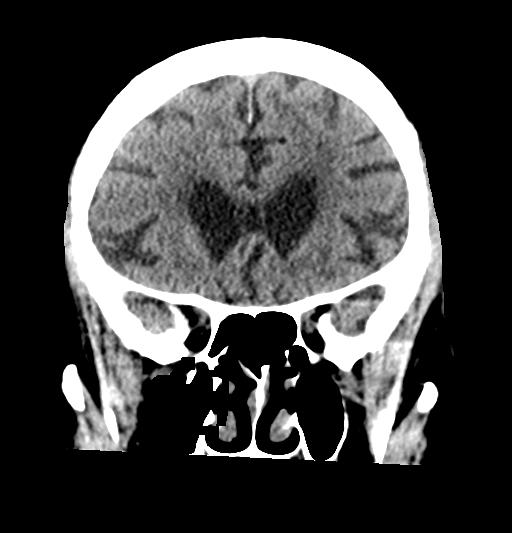
[im 31/69  brain]
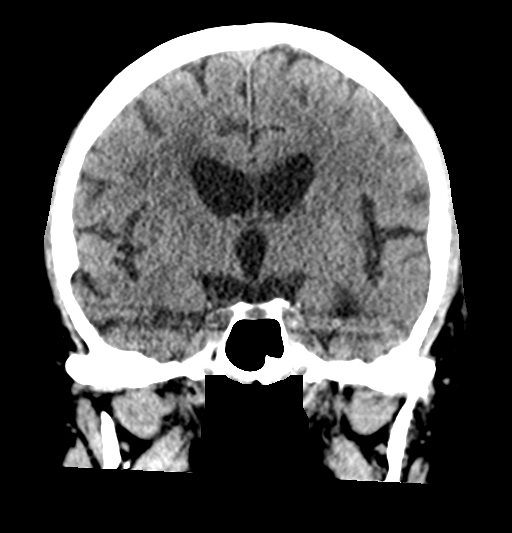
[im 38/69  brain]
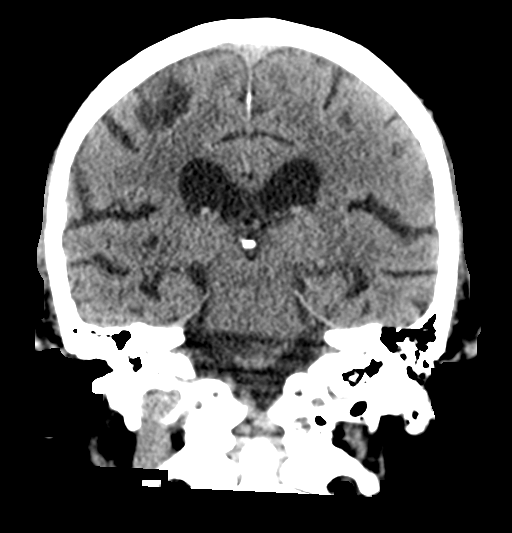

[Series 5: sagittal soft tissue · sagittal · 0.36mm/px · 3 of 58 slices shown]
[im 20/58  brain]
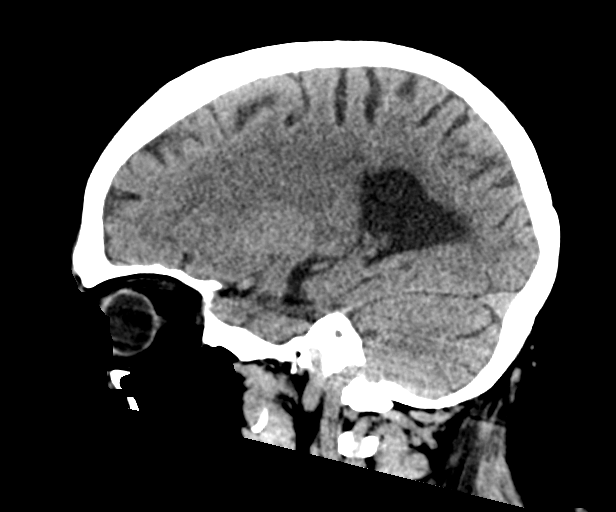
[im 29/58  brain]
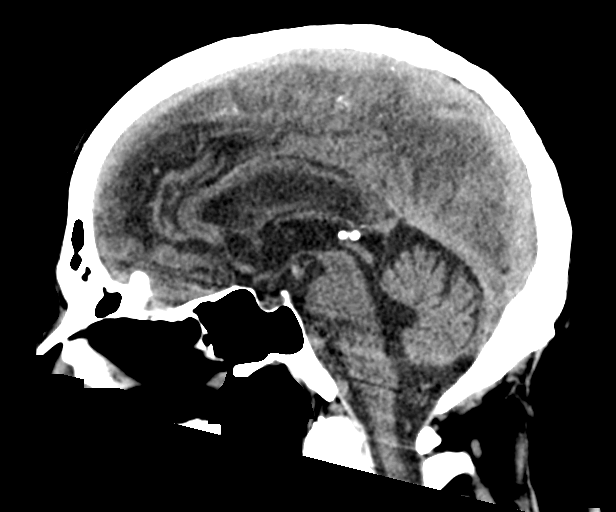
[im 39/58  brain]
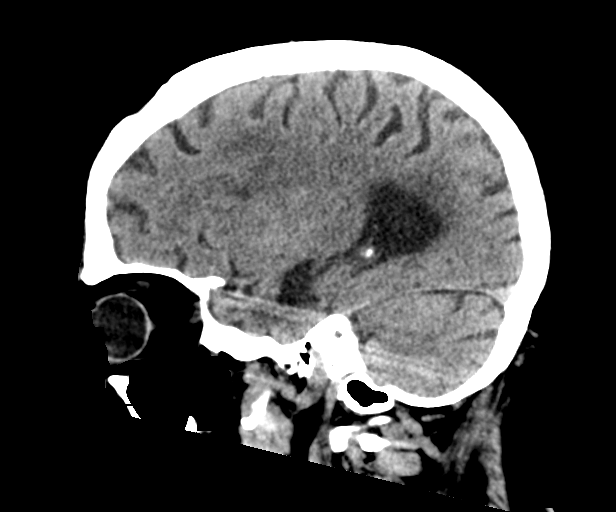

[16 of 47 positions shown; findings below may reference images not displayed]

FINDINGS: Brain: Mild chronic ischemic white matter disease is noted. No mass
effect or midline shift is noted. Ventricular size is within normal
limits. There is no evidence of mass lesion, hemorrhage or acute
infarction.

Vascular: No hyperdense vessel or unexpected calcification.

Skull: Lytic defect is seen involving the outer table of the left
frontal skull concerning for possible metastatic disease. Several
sclerotic foci are noted in other portions of the skull concerning
for metastatic disease.

Sinuses/Orbits: No acute finding.

Other: None.
IMPRESSION: No acute intracranial abnormality seen.

Large lytic defect seen involving outer table of left frontal skull
concerning for possible metastatic disease. Several sclerotic foci
are noted in other portions of the skull concerning for metastatic
disease.
